# Patient Record
Sex: Female | Born: 1935 | Race: Black or African American | Hispanic: No | Smoking: Former smoker
Health system: Southern US, Community
[De-identification: ages and names within clinical notes are randomized; demographics above are authoritative.]

## PROBLEM LIST (undated history)

## (undated) DIAGNOSIS — E785 Hyperlipidemia, unspecified: Secondary | ICD-10-CM

## (undated) DIAGNOSIS — T7840XA Allergy, unspecified, initial encounter: Secondary | ICD-10-CM

## (undated) DIAGNOSIS — M1990 Unspecified osteoarthritis, unspecified site: Secondary | ICD-10-CM

## (undated) DIAGNOSIS — J449 Chronic obstructive pulmonary disease, unspecified: Secondary | ICD-10-CM

## (undated) DIAGNOSIS — N186 End stage renal disease: Secondary | ICD-10-CM

## (undated) DIAGNOSIS — R739 Hyperglycemia, unspecified: Secondary | ICD-10-CM

## (undated) DIAGNOSIS — J45909 Unspecified asthma, uncomplicated: Secondary | ICD-10-CM

## (undated) DIAGNOSIS — N189 Chronic kidney disease, unspecified: Secondary | ICD-10-CM

## (undated) DIAGNOSIS — E871 Hypo-osmolality and hyponatremia: Secondary | ICD-10-CM

## (undated) DIAGNOSIS — I10 Essential (primary) hypertension: Secondary | ICD-10-CM

## (undated) DIAGNOSIS — I509 Heart failure, unspecified: Secondary | ICD-10-CM

## (undated) DIAGNOSIS — N179 Acute kidney failure, unspecified: Secondary | ICD-10-CM

## (undated) DIAGNOSIS — K219 Gastro-esophageal reflux disease without esophagitis: Secondary | ICD-10-CM

## (undated) DIAGNOSIS — J9610 Chronic respiratory failure, unspecified whether with hypoxia or hypercapnia: Secondary | ICD-10-CM

## (undated) HISTORY — DX: Unspecified asthma, uncomplicated: J45.909

## (undated) HISTORY — DX: Unspecified osteoarthritis, unspecified site: M19.90

## (undated) HISTORY — PX: TUMOR REMOVAL: SHX12

## (undated) HISTORY — DX: Allergy, unspecified, initial encounter: T78.40XA

## (undated) HISTORY — DX: Gastro-esophageal reflux disease without esophagitis: K21.9

## (undated) HISTORY — DX: Hyperlipidemia, unspecified: E78.5

## (undated) HISTORY — DX: Heart failure, unspecified: I50.9

## (undated) HISTORY — PX: CATARACT EXTRACTION: SUR2

## (undated) HISTORY — PX: VAGINAL HYSTERECTOMY: SUR661

## (undated) HISTORY — DX: Essential (primary) hypertension: I10

---

## 1960-07-02 HISTORY — PX: ECTOPIC PREGNANCY SURGERY: SHX613

## 1978-07-02 DIAGNOSIS — I509 Heart failure, unspecified: Secondary | ICD-10-CM

## 1978-07-02 HISTORY — DX: Heart failure, unspecified: I50.9

## 1997-10-26 ENCOUNTER — Other Ambulatory Visit: Admission: RE | Admit: 1997-10-26 | Discharge: 1997-10-26 | Payer: Self-pay | Admitting: Internal Medicine

## 1997-11-16 ENCOUNTER — Other Ambulatory Visit: Admission: RE | Admit: 1997-11-16 | Discharge: 1997-11-16 | Payer: Self-pay | Admitting: Internal Medicine

## 1998-01-11 ENCOUNTER — Other Ambulatory Visit: Admission: RE | Admit: 1998-01-11 | Discharge: 1998-01-11 | Payer: Self-pay | Admitting: Internal Medicine

## 1998-01-11 ENCOUNTER — Ambulatory Visit (HOSPITAL_COMMUNITY): Admission: RE | Admit: 1998-01-11 | Discharge: 1998-01-11 | Payer: Self-pay | Admitting: Internal Medicine

## 1998-02-14 ENCOUNTER — Other Ambulatory Visit: Admission: RE | Admit: 1998-02-14 | Discharge: 1998-02-14 | Payer: Self-pay | Admitting: Obstetrics & Gynecology

## 1999-01-17 ENCOUNTER — Encounter: Payer: Self-pay | Admitting: Internal Medicine

## 1999-01-17 ENCOUNTER — Ambulatory Visit (HOSPITAL_COMMUNITY): Admission: RE | Admit: 1999-01-17 | Discharge: 1999-01-17 | Payer: Self-pay | Admitting: Internal Medicine

## 1999-03-20 ENCOUNTER — Other Ambulatory Visit: Admission: RE | Admit: 1999-03-20 | Discharge: 1999-03-20 | Payer: Self-pay | Admitting: Obstetrics & Gynecology

## 1999-10-12 ENCOUNTER — Encounter: Payer: Self-pay | Admitting: Internal Medicine

## 1999-10-12 ENCOUNTER — Ambulatory Visit (HOSPITAL_COMMUNITY): Admission: RE | Admit: 1999-10-12 | Discharge: 1999-10-12 | Payer: Self-pay | Admitting: Internal Medicine

## 2000-01-19 ENCOUNTER — Encounter: Payer: Self-pay | Admitting: Obstetrics and Gynecology

## 2000-01-19 ENCOUNTER — Ambulatory Visit (HOSPITAL_COMMUNITY): Admission: RE | Admit: 2000-01-19 | Discharge: 2000-01-19 | Payer: Self-pay | Admitting: Obstetrics and Gynecology

## 2000-02-27 ENCOUNTER — Ambulatory Visit (HOSPITAL_COMMUNITY): Admission: RE | Admit: 2000-02-27 | Discharge: 2000-02-27 | Payer: Self-pay | Admitting: Internal Medicine

## 2000-02-27 ENCOUNTER — Encounter: Payer: Self-pay | Admitting: Internal Medicine

## 2000-04-16 ENCOUNTER — Other Ambulatory Visit: Admission: RE | Admit: 2000-04-16 | Discharge: 2000-04-16 | Payer: Self-pay | Admitting: Obstetrics and Gynecology

## 2001-01-28 ENCOUNTER — Ambulatory Visit (HOSPITAL_COMMUNITY): Admission: RE | Admit: 2001-01-28 | Discharge: 2001-01-28 | Payer: Self-pay | Admitting: Internal Medicine

## 2001-01-28 ENCOUNTER — Encounter: Payer: Self-pay | Admitting: Obstetrics and Gynecology

## 2001-04-21 ENCOUNTER — Other Ambulatory Visit: Admission: RE | Admit: 2001-04-21 | Discharge: 2001-04-21 | Payer: Self-pay | Admitting: Obstetrics and Gynecology

## 2001-04-21 ENCOUNTER — Ambulatory Visit (HOSPITAL_COMMUNITY): Admission: RE | Admit: 2001-04-21 | Discharge: 2001-04-21 | Payer: Self-pay | Admitting: Obstetrics and Gynecology

## 2001-04-21 ENCOUNTER — Encounter: Payer: Self-pay | Admitting: Obstetrics and Gynecology

## 2001-06-02 ENCOUNTER — Ambulatory Visit (HOSPITAL_COMMUNITY): Admission: RE | Admit: 2001-06-02 | Discharge: 2001-06-03 | Payer: Self-pay | Admitting: Cardiovascular Disease

## 2001-06-03 ENCOUNTER — Encounter: Payer: Self-pay | Admitting: Cardiovascular Disease

## 2001-10-21 ENCOUNTER — Encounter: Payer: Self-pay | Admitting: Internal Medicine

## 2001-10-21 ENCOUNTER — Ambulatory Visit (HOSPITAL_COMMUNITY): Admission: RE | Admit: 2001-10-21 | Discharge: 2001-10-21 | Payer: Self-pay | Admitting: Internal Medicine

## 2002-02-02 ENCOUNTER — Encounter: Payer: Self-pay | Admitting: Obstetrics and Gynecology

## 2002-02-02 ENCOUNTER — Ambulatory Visit (HOSPITAL_COMMUNITY): Admission: RE | Admit: 2002-02-02 | Discharge: 2002-02-02 | Payer: Self-pay | Admitting: Obstetrics and Gynecology

## 2002-03-18 ENCOUNTER — Emergency Department (HOSPITAL_COMMUNITY): Admission: EM | Admit: 2002-03-18 | Discharge: 2002-03-19 | Payer: Self-pay | Admitting: Emergency Medicine

## 2002-03-18 ENCOUNTER — Encounter: Payer: Self-pay | Admitting: Emergency Medicine

## 2002-04-30 ENCOUNTER — Other Ambulatory Visit: Admission: RE | Admit: 2002-04-30 | Discharge: 2002-04-30 | Payer: Self-pay | Admitting: Obstetrics and Gynecology

## 2003-02-04 ENCOUNTER — Ambulatory Visit (HOSPITAL_COMMUNITY): Admission: RE | Admit: 2003-02-04 | Discharge: 2003-02-04 | Payer: Self-pay | Admitting: Obstetrics and Gynecology

## 2003-02-04 ENCOUNTER — Encounter: Payer: Self-pay | Admitting: Obstetrics and Gynecology

## 2003-06-11 ENCOUNTER — Other Ambulatory Visit: Admission: RE | Admit: 2003-06-11 | Discharge: 2003-06-11 | Payer: Self-pay | Admitting: Obstetrics and Gynecology

## 2004-02-17 ENCOUNTER — Ambulatory Visit (HOSPITAL_COMMUNITY): Admission: RE | Admit: 2004-02-17 | Discharge: 2004-02-17 | Payer: Self-pay | Admitting: Obstetrics and Gynecology

## 2005-02-16 ENCOUNTER — Ambulatory Visit (HOSPITAL_COMMUNITY): Admission: RE | Admit: 2005-02-16 | Discharge: 2005-02-16 | Payer: Self-pay | Admitting: Obstetrics and Gynecology

## 2005-06-21 ENCOUNTER — Other Ambulatory Visit: Admission: RE | Admit: 2005-06-21 | Discharge: 2005-06-21 | Payer: Self-pay | Admitting: Obstetrics and Gynecology

## 2006-02-19 ENCOUNTER — Ambulatory Visit (HOSPITAL_COMMUNITY): Admission: RE | Admit: 2006-02-19 | Discharge: 2006-02-19 | Payer: Self-pay | Admitting: Obstetrics and Gynecology

## 2007-02-25 ENCOUNTER — Ambulatory Visit (HOSPITAL_COMMUNITY): Admission: RE | Admit: 2007-02-25 | Discharge: 2007-02-25 | Payer: Self-pay | Admitting: Obstetrics and Gynecology

## 2008-02-26 ENCOUNTER — Ambulatory Visit (HOSPITAL_COMMUNITY): Admission: RE | Admit: 2008-02-26 | Discharge: 2008-02-26 | Payer: Self-pay | Admitting: Obstetrics and Gynecology

## 2008-12-24 ENCOUNTER — Encounter: Admission: RE | Admit: 2008-12-24 | Discharge: 2008-12-24 | Payer: Self-pay | Admitting: Obstetrics and Gynecology

## 2009-02-28 ENCOUNTER — Ambulatory Visit (HOSPITAL_COMMUNITY): Admission: RE | Admit: 2009-02-28 | Discharge: 2009-02-28 | Payer: Self-pay | Admitting: Obstetrics and Gynecology

## 2009-07-14 ENCOUNTER — Ambulatory Visit (HOSPITAL_COMMUNITY): Admission: RE | Admit: 2009-07-14 | Discharge: 2009-07-14 | Payer: Self-pay | Admitting: Internal Medicine

## 2010-03-14 ENCOUNTER — Ambulatory Visit (HOSPITAL_COMMUNITY): Admission: RE | Admit: 2010-03-14 | Discharge: 2010-03-14 | Payer: Self-pay | Admitting: Obstetrics and Gynecology

## 2010-07-24 ENCOUNTER — Encounter: Payer: Self-pay | Admitting: Obstetrics and Gynecology

## 2011-02-12 ENCOUNTER — Other Ambulatory Visit (HOSPITAL_COMMUNITY): Payer: Self-pay | Admitting: Obstetrics and Gynecology

## 2011-02-12 DIAGNOSIS — Z1231 Encounter for screening mammogram for malignant neoplasm of breast: Secondary | ICD-10-CM

## 2011-03-20 ENCOUNTER — Ambulatory Visit (HOSPITAL_COMMUNITY): Payer: Medicare Other

## 2011-04-06 ENCOUNTER — Ambulatory Visit (HOSPITAL_COMMUNITY)
Admission: RE | Admit: 2011-04-06 | Discharge: 2011-04-06 | Disposition: A | Discharge status: Home / Self Care | Payer: Medicare Other | Source: Ambulatory Visit | Attending: Obstetrics and Gynecology | Admitting: Obstetrics and Gynecology

## 2011-04-06 DIAGNOSIS — Z1231 Encounter for screening mammogram for malignant neoplasm of breast: Secondary | ICD-10-CM | POA: Insufficient documentation

## 2011-09-12 ENCOUNTER — Encounter: Payer: Self-pay | Admitting: Internal Medicine

## 2011-10-25 ENCOUNTER — Encounter: Payer: Self-pay | Admitting: Internal Medicine

## 2011-12-07 ENCOUNTER — Ambulatory Visit (AMBULATORY_SURGERY_CENTER): Payer: Medicare Other | Admitting: *Deleted

## 2011-12-07 VITALS — Ht 64.0 in | Wt 197.0 lb

## 2011-12-07 DIAGNOSIS — Z1211 Encounter for screening for malignant neoplasm of colon: Secondary | ICD-10-CM

## 2011-12-07 MED ORDER — PEG-KCL-NACL-NASULF-NA ASC-C 100 G PO SOLR: ORAL | Status: DC

## 2011-12-21 ENCOUNTER — Encounter: Payer: Self-pay | Admitting: Internal Medicine

## 2011-12-21 ENCOUNTER — Ambulatory Visit (AMBULATORY_SURGERY_CENTER): Payer: Medicare Other | Admitting: Internal Medicine

## 2011-12-21 VITALS — BP 186/90 | HR 64 | Temp 98.5°F | Resp 20 | Ht 64.0 in | Wt 197.0 lb

## 2011-12-21 DIAGNOSIS — Z1211 Encounter for screening for malignant neoplasm of colon: Secondary | ICD-10-CM

## 2011-12-21 MED ORDER — SODIUM CHLORIDE 0.9 % IV SOLN: 500.0000 mL | INTRAVENOUS | Status: DC

## 2011-12-21 NOTE — Progress Notes (Signed)
[id number]  Patient desatted to 76%, and had to be bagged for approx. 30 secs; was given an airway by CRNA.   10l of 02 given via bag and then via airway; pt then responsive and sats were stable.   Pt had her airway taken out by Levin Erp, CRNA.  Patient awake and alert and taken to recovery room in stablew condition with sats of 96%. Ernestine Conrad, RN

## 2011-12-21 NOTE — Patient Instructions (Addendum)
YOU HAD AN ENDOSCOPIC PROCEDURE TODAY AT THE [facility]: Refer to the procedure report that was given to you for any specific questions about what was found during the examination.  If the procedure report does not answer your questions, please call your gastroenterologist to clarify.  If you requested that your care partner not be given the details of your procedure findings, then the procedure report has been included in a sealed envelope for you to review at your convenience later.  YOU SHOULD EXPECT: Some feelings of bloating in the abdomen. Passage of more gas than usual.  Walking can help get rid of the air that was put into your GI tract during the procedure and reduce the bloating. If you had a lower endoscopy (such as a colonoscopy or flexible sigmoidoscopy) you may notice spotting of blood in your stool or on the toilet paper. If you underwent a bowel prep for your procedure, then you may not have a normal bowel movement for a few days.  DIET: Your first meal following the procedure should be a light meal and then it is ok to progress to your normal diet.  A half-sandwich or bowl of soup is an example of a good first meal.  Heavy or fried foods are harder to digest and may make you feel nauseous or bloated.  Likewise meals heavy in dairy and vegetables can cause extra gas to form and this can also increase the bloating.  Drink plenty of fluids but you should avoid alcoholic beverages for 24 hours.  ACTIVITY: Your care partner should take you home directly after the procedure.  You should plan to take it easy, moving slowly for the rest of the day.  You can resume normal activity the day after the procedure however you should NOT DRIVE or use heavy machinery for 24 hours (because of the sedation medicines used during the test).    SYMPTOMS TO REPORT IMMEDIATELY: A gastroenterologist can be reached at any hour.  During normal business hours, 8:30 AM to 5:00 PM Monday through Friday,  call [phone].  After hours and on weekends, please call the GI answering service at [phone] who will take a message and have the physician on call contact you.   Following lower endoscopy (colonoscopy or flexible sigmoidoscopy):  Excessive amounts of blood in the stool  Significant tenderness or worsening of abdominal pains  Swelling of the abdomen that is new, acute  Fever of 100F or higher  Following upper endoscopy (EGD)  Vomiting of blood or coffee ground material  New chest pain or pain under the shoulder blades  Painful or persistently difficult swallowing  New shortness of breath  Fever of 100F or higher  Black, tarry-looking stools  FOLLOW UP: If any biopsies were taken you will be contacted by phone or by letter within the next 1-3 weeks.  Call your gastroenterologist if you have not heard about the biopsies in 3 weeks.  Our staff will call the home number listed on your records the next business day following your procedure to check on you and address any questions or concerns that you may have at that time regarding the information given to you following your procedure. This is a courtesy call and so if there is no answer at the home number and we have not heard from you through the emergency physician on call, we will assume that you have returned to your regular daily activities without incident.  SIGNATURES/CONFIDENTIALITY: You and/or your care   partner have signed paperwork which will be entered into your electronic medical record.  These signatures attest to the fact that that the information above on your After Visit Summary has been reviewed and is understood.  Full responsibility of the confidentiality of this discharge information lies with you and/or your care-partner.  

## 2011-12-21 NOTE — Progress Notes (Signed)
Patient did not experience any of the following events: a burn prior to discharge; a fall within the facility; wrong site/side/patient/procedure/implant event; or a hospital transfer or hospital admission upon discharge from the facility. (G8907) Patient did not have preoperative order for IV antibiotic SSI prophylaxis. (G8918)  

## 2011-12-21 NOTE — Op Note (Signed)
[facility] [employer]. [place],   [id number]  COLONOSCOPY PROCEDURE REPORT  PATIENT:  Mallory Chavez, Mallory Chavez  MR#:  [id number] BIRTHDATE:  11-Jul-1935, 75 yrs. old  GENDER:  female ENDOSCOPIST:  Lowella Bandy. Olevia Perches, MD REF. BY:  Nolene Ebbs, M.D. PROCEDURE DATE:  12/21/2011 PROCEDURE:  Colonoscopy [id number] ASA CLASS:  Class III INDICATIONS:  colorectal cancer screening, average risk last colon 2003 - diverticulosis MEDICATIONS:   MAC sedation, administered by CRNA, propofol (Diprivan) 120 mg  DESCRIPTION OF PROCEDURE:   After the risks and benefits and of the procedure were explained, informed consent was obtained. Digital rectal exam was performed and revealed no rectal masses. The LB CF-H180AL [id number] endoscope was introduced through the anus and advanced to the cecum, which was identified by both the appendix and ileocecal valve.  The quality of the prep was good, using MoviPrep.  The instrument was then slowly withdrawn as the colon was fully examined. <<PROCEDUREIMAGES>>  FINDINGS:  Severe diverticulosis was found throughout the colon (see image1, image2, image5, and image6).  This was otherwise a normal examination of the colon (see image7, image3, image4, and image5).   Retroflexed views in the rectum revealed no abnormalities.    The scope was then withdrawn from the patient and the procedure completed.  COMPLICATIONS:  A complication of hypoxemia occured on 12/21/2011 at.  o2 down to 76%, responded to oral airway and ventilation via Ambu bag x 30 sec, responded promptly ENDOSCOPIC IMPRESSION: 1) Severe diverticulosis throughout the colon 2) Otherwise normal examination RECOMMENDATIONS: 1) High fiber diet Metamucil 1 tablesp daily  REPEAT EXAM:  In 0 year(s) for.  no recall due to age and cardiac problems  ______________________________ Lowella Bandy. Olevia Perches, MD  CC:  n. eSIGNED:   Lowella Bandy. Daiton Cowles at 12/21/2011 10:07 AM  Curt Jews, [id number]

## 2011-12-24 ENCOUNTER — Telehealth: Payer: Self-pay | Admitting: *Deleted

## 2011-12-24 NOTE — Telephone Encounter (Signed)
Called patient's number x 3, busy.

## 2012-03-17 ENCOUNTER — Other Ambulatory Visit (HOSPITAL_COMMUNITY): Payer: Self-pay | Admitting: Internal Medicine

## 2012-03-17 DIAGNOSIS — Z1231 Encounter for screening mammogram for malignant neoplasm of breast: Secondary | ICD-10-CM

## 2012-04-08 ENCOUNTER — Ambulatory Visit (HOSPITAL_COMMUNITY)
Admission: RE | Admit: 2012-04-08 | Discharge: 2012-04-08 | Disposition: A | Discharge status: Home / Self Care | Payer: Medicare Other | Source: Ambulatory Visit | Attending: Internal Medicine | Admitting: Internal Medicine

## 2012-04-08 DIAGNOSIS — Z1231 Encounter for screening mammogram for malignant neoplasm of breast: Secondary | ICD-10-CM

## 2012-09-30 ENCOUNTER — Institutional Professional Consult (permissible substitution): Payer: Medicare Other | Admitting: Internal Medicine

## 2012-10-01 ENCOUNTER — Ambulatory Visit (INDEPENDENT_AMBULATORY_CARE_PROVIDER_SITE_OTHER): Payer: Medicare Other | Admitting: Internal Medicine

## 2012-10-01 ENCOUNTER — Encounter: Payer: Self-pay | Admitting: Internal Medicine

## 2012-10-01 VITALS — BP 154/82 | HR 68 | Temp 97.8°F | Ht 64.0 in | Wt 207.0 lb

## 2012-10-01 DIAGNOSIS — I10 Essential (primary) hypertension: Secondary | ICD-10-CM

## 2012-10-01 DIAGNOSIS — R0602 Shortness of breath: Secondary | ICD-10-CM

## 2012-10-01 MED ORDER — OLMESARTAN MEDOXOMIL-HCTZ 40-25 MG PO TABS: ORAL_TABLET | ORAL | Status: DC

## 2012-10-01 NOTE — Patient Instructions (Addendum)
benicar 40/25 one half daily in place of lisnopril  GERD (REFLUX)  is an extremely common cause of respiratory symptoms, many times with no significant heartburn at all.    It can be treated with medication, but also with lifestyle changes including avoidance of late meals, excessive alcohol, smoking cessation, and avoid fatty foods, chocolate, peppermint, colas, red wine, and acidic juices such as orange juice.  NO MINT OR MENTHOL PRODUCTS SO NO COUGH DROPS  USE SUGARLESS CANDY INSTEAD (jolley ranchers or Stover's)  NO OIL BASED VITAMINS - use powdered substitutes.  Please schedule a follow up office visit in 4 weeks, sooner if needed for PFT's and just use your inhalers as needed ( you should find you need them less and less)

## 2012-10-01 NOTE — Progress Notes (Signed)
  Subjective:    Patient ID: Mallory Chavez, female    DOB: 1935/10/27 MRN: [id number]  HPI   16 yowbf quit smoking around 1994 bothered by some seasonal rhinitis spring > fall x around 1990 then 2012 developed breathing problems dx asthma variably responsive to rx referred 10/01/2012 to pulmonary clinic by Dr Jeanie Cooks.   10/01/2012 1st pulmonary /Bowen Goyal  On ACEi cc abruptly 2012 shortness of breath assoc with cough min productive mostly clear. Sob x across a parking lot. Not responding to multiple asthma meds  No obvious daytime variabilty to symptoms or assoc changes in cough or sputum vol or color cp or chest tightness, subjective wheeze overt sinus or hb symptoms. No unusual exp hx or h/o childhood pna/ asthma or premature birth to her  knowledge.   Sleeping ok without nocturnal  or early am exacerbation  of respiratory  c/o's or need for noct saba. Also denies any obvious fluctuation of symptoms with weather or environmental changes or other aggravating or alleviating factors except as outlined above         Review of Systems  Constitutional: Positive for unexpected weight change. Negative for fever.  HENT: Positive for ear pain, congestion and sneezing. Negative for nosebleeds, sore throat, rhinorrhea, trouble swallowing, dental problem, postnasal drip and sinus pressure.   Eyes: Negative for redness and itching.  Respiratory: Positive for cough and shortness of breath. Negative for chest tightness and wheezing.   Cardiovascular: Positive for palpitations. Negative for leg swelling.  Gastrointestinal: Negative for nausea and vomiting.  Genitourinary: Negative for dysuria.  Musculoskeletal: Negative for joint swelling.  Skin: Negative for rash.  Neurological: Negative for headaches.  Hematological: Does not bruise/bleed easily.  Psychiatric/Behavioral: Negative for dysphoric mood. The patient is not nervous/anxious.        Objective:   Physical Exam   Wt Readings from Last 3  Encounters:  10/01/12 207 lb (93.895 kg)  12/21/11 197 lb (89.359 kg)  12/07/11 197 lb (89.359 kg)     HEENT: nl dentition, turbinates, and orophanx. Nl external ear canals without cough reflex   NECK :  without JVD/Nodes/TM/ nl carotid upstrokes bilaterally   LUNGS: no acc muscle use, clear to A and P bilaterally without cough on insp or exp maneuvers   CV:  RRR  no s3 or murmur or increase in P2, no edema   ABD:  soft and nontender with nl excursion in the supine position. No bruits or organomegaly, bowel sounds nl  MS:  warm without deformities, calf tenderness, cyanosis or clubbing  SKIN: warm and dry without lesions    NEURO:  alert, approp, no deficits    cxr > none recent on file      Assessment & Plan:

## 2012-10-03 DIAGNOSIS — I10 Essential (primary) hypertension: Secondary | ICD-10-CM | POA: Insufficient documentation

## 2012-10-03 NOTE — Assessment & Plan Note (Signed)

## 2012-10-03 NOTE — Assessment & Plan Note (Addendum)
Symptoms are markedly disproportionate to objective findings and not clear this is a lung problem but pt does appear to have difficult airway management issues. . DDX of  difficult airways managment all start with A and  include Adherence, Ace Inhibitors, Acid Reflux, Active Sinus Disease, Alpha 1 Antitripsin deficiency, Anxiety masquerading as Airways dz,  ABPA,  allergy(esp in young), Aspiration (esp in elderly), Adverse effects of DPI,  Active smokers, plus two Bs  = Bronchiectasis and Beta blocker use..and one C= CHF   ACEi would be the leading suspect here> try off first then regroup  Adherence is always the initial "prime suspect" and is a multilayered concern that requires a "trust but verify" approach in every patient - starting with knowing how to use medications, especially inhalers, correctly, keeping up with refills and understanding the fundamental difference between maintenance and prns vs those medications only taken for a very short course and then stopped and not refilled.   ? Acid reflux > diet reviewed  ? Beta blocker effect > would be a concern if she really had asthma but for now ok on coreg

## 2012-11-10 ENCOUNTER — Encounter: Payer: Self-pay | Admitting: Internal Medicine

## 2012-11-10 ENCOUNTER — Ambulatory Visit (INDEPENDENT_AMBULATORY_CARE_PROVIDER_SITE_OTHER): Payer: Medicare Other | Admitting: Internal Medicine

## 2012-11-10 VITALS — BP 148/80 | HR 72 | Temp 98.5°F | Ht 63.0 in | Wt 206.0 lb

## 2012-11-10 DIAGNOSIS — J449 Chronic obstructive pulmonary disease, unspecified: Secondary | ICD-10-CM

## 2012-11-10 DIAGNOSIS — I10 Essential (primary) hypertension: Secondary | ICD-10-CM

## 2012-11-10 DIAGNOSIS — R0602 Shortness of breath: Secondary | ICD-10-CM

## 2012-11-10 DIAGNOSIS — J4489 Other specified chronic obstructive pulmonary disease: Secondary | ICD-10-CM

## 2012-11-10 MED ORDER — ACLIDINIUM BROMIDE 400 MCG/ACT IN AEPB: 1.0000 | INHALATION_SPRAY | Freq: Two times a day (BID) | RESPIRATORY_TRACT | Status: DC

## 2012-11-10 NOTE — Progress Notes (Signed)
  Subjective:    Patient ID: Mallory Chavez, female    DOB: Aug 31, 1935 MRN: [id number]  HPI   55 yowbf quit smoking around 1994 bothered by some seasonal rhinitis spring > fall x around 1990 then 2012 developed breathing problems dx asthma variably responsive to rx referred 10/01/2012 to pulmonary clinic by Dr Jeanie Cooks.   10/01/2012 1st pulmonary /Zuria Fosdick  On ACEi cc abruptly 2012 shortness of breath assoc with cough min productive mostly clear. Sob x across a parking lot. Not responding to multiple asthma meds. rec benicar 40/25 one half daily in place of lisnopril GERD diet   11/10/2012 f/u ov/Kristian Mogg re cough/ copd Chief Complaint  Patient presents with  . Follow-up    Pt states breathing has improved some since the last visit. She c/o having some swelling in her fett for the past 4 wks.   no need for albuterol at all, some throat clearing needed in ams, still doe x 5-10 min bike   No obvious daytime variabilty to symptoms or assoc change sputum vol or color cp or chest tightness, subjective wheeze overt sinus or hb symptoms. No unusual exp hx or h/o childhood pna/ asthma or premature birth to her  knowledge.   Sleeping ok without nocturnal  or early am exacerbation  of respiratory  c/o's or need for noct saba. Also denies any obvious fluctuation of symptoms with weather or environmental changes or other aggravating or alleviating factors except as outlined above   Current Medications, Allergies, Past Medical History, Past Surgical History, Family History, and Social History were reviewed in [employer] record.  ROS  The following are not active complaints unless bolded sore throat, dysphagia, dental problems, itching, sneezing,  nasal congestion or excess/ purulent secretions, ear ache,   fever, chills, sweats, unintended wt loss, pleuritic or exertional cp, hemoptysis,  orthopnea pnd or leg swelling, presyncope, palpitations, heartburn, abdominal pain, anorexia,  nausea, vomiting, diarrhea  or change in bowel or urinary habits, change in stools or urine, dysuria,hematuria,  rash, arthralgias, visual complaints, headache, numbness weakness or ataxia or problems with walking or coordination,  change in mood/affect or memory.                 Objective:   Physical Exam  11/10/2012       206 Wt Readings from Last 3 Encounters:  10/01/12 207 lb (93.895 kg)  12/21/11 197 lb (89.359 kg)  12/07/11 197 lb (89.359 kg)     HEENT: nl dentition, turbinates, and orophanx. Nl external ear canals without cough reflex   NECK :  without JVD/Nodes/TM/ nl carotid upstrokes bilaterally   LUNGS: no acc muscle use, clear to A and P bilaterally without cough on insp or exp maneuvers   CV:  RRR  no s3 or murmur or increase in P2, no edema   ABD:  soft and nontender with nl excursion in the supine position. No bruits or organomegaly, bowel sounds nl  MS:  warm without deformities, calf tenderness, cyanosis or clubbing               Assessment & Plan:

## 2012-11-10 NOTE — Assessment & Plan Note (Signed)
Change acei to arb effective 10/01/12 for pseudoasthma > resolved    No question she's better off acei > bp well controlled on arb> continue for now

## 2012-11-10 NOTE — Patient Instructions (Addendum)
Add turdorza one puff twice daily after dulera   Please schedule a follow up office visit in 6 weeks, call sooner if needed Late add cxr on return

## 2012-11-10 NOTE — Progress Notes (Signed)
PFT done today. 

## 2012-11-10 NOTE — Assessment & Plan Note (Addendum)
-   PFT's 11/10/2012 FEV1 0.85 (52%)ratio 56 and no better p B 2 - 5-10 min stationery bike > add tudorza  She is clearly better but given severity of persistent doe needs trial of lama  The proper method of use, as well as anticipated side effects, of a metered-dose inhaler are discussed and demonstrated to the patient. Improved effectiveness after extensive coaching during this visit to a level of approximately  90% so ok to add  tudorza with other option change to symbicort 160 2 bid now that cough gone and can likely tolerate max laba/ics better than while on acei    Each maintenance medication was reviewed in detail including most importantly the difference between maintenance and as needed and under what circumstances the prns are to be used.  Please see instructions for details which were reviewed in writing and the patient given a copy.

## 2012-11-19 ENCOUNTER — Encounter: Payer: Self-pay | Admitting: Internal Medicine

## 2012-12-19 ENCOUNTER — Encounter: Payer: Self-pay | Admitting: Internal Medicine

## 2012-12-19 ENCOUNTER — Ambulatory Visit (INDEPENDENT_AMBULATORY_CARE_PROVIDER_SITE_OTHER)
Admission: RE | Admit: 2012-12-19 | Discharge: 2012-12-19 | Disposition: A | Discharge status: Home / Self Care | Payer: Medicare Other | Source: Ambulatory Visit | Attending: Internal Medicine | Admitting: Internal Medicine

## 2012-12-19 ENCOUNTER — Other Ambulatory Visit (INDEPENDENT_AMBULATORY_CARE_PROVIDER_SITE_OTHER): Payer: Medicare Other

## 2012-12-19 ENCOUNTER — Ambulatory Visit (INDEPENDENT_AMBULATORY_CARE_PROVIDER_SITE_OTHER): Payer: Medicare Other | Admitting: Internal Medicine

## 2012-12-19 VITALS — BP 132/84 | HR 76 | Temp 97.4°F | Ht 62.5 in | Wt 206.0 lb

## 2012-12-19 DIAGNOSIS — I10 Essential (primary) hypertension: Secondary | ICD-10-CM

## 2012-12-19 DIAGNOSIS — J449 Chronic obstructive pulmonary disease, unspecified: Secondary | ICD-10-CM

## 2012-12-19 DIAGNOSIS — R0602 Shortness of breath: Secondary | ICD-10-CM

## 2012-12-19 DIAGNOSIS — N19 Unspecified kidney failure: Secondary | ICD-10-CM

## 2012-12-19 LAB — BASIC METABOLIC PANEL
BUN: 30 mg/dL — ABNORMAL HIGH (ref 6–23)
CO2: 25 mEq/L (ref 19–32)
Calcium: 10.6 mg/dL — ABNORMAL HIGH (ref 8.4–10.5)
Chloride: 102 mEq/L (ref 96–112)
Creatinine, Ser: 1.8 mg/dL — ABNORMAL HIGH (ref 0.4–1.2)

## 2012-12-19 LAB — CBC WITH DIFFERENTIAL/PLATELET
Basophils Absolute: 0 10*3/uL (ref 0.0–0.1)
HCT: 43.6 % (ref 36.0–46.0)
Hemoglobin: 14.4 g/dL (ref 12.0–15.0)
Lymphs Abs: 2.8 10*3/uL (ref 0.7–4.0)
MCHC: 33 g/dL (ref 30.0–36.0)
Monocytes Relative: 9.9 % (ref 3.0–12.0)
Neutro Abs: 3.8 10*3/uL (ref 1.4–7.7)
RDW: 13.8 % (ref 11.5–14.6)

## 2012-12-19 LAB — BRAIN NATRIURETIC PEPTIDE: Pro B Natriuretic peptide (BNP): 76 pg/mL (ref 0.0–100.0)

## 2012-12-19 NOTE — Progress Notes (Signed)
Subjective:    Patient ID: Mallory Chavez, female    DOB: 06-May-1936 MRN: [id number]    Brief patient profile:  31 yowbf quit smoking around 1994 bothered by some seasonal rhinitis spring > fall x around 1990 then 2012 developed breathing problems dx asthma variably responsive to rx referred 10/01/2012 to pulmonary clinic by Dr Jeanie Cooks and proved to have GOLD II COPD 10/2012   10/01/2012 1st pulmonary /Zion Ta  On ACEi cc abruptly 2012 shortness of breath assoc with cough min productive mostly clear. Sob x across a parking lot. Not responding to multiple asthma meds. rec benicar 40/25 one half daily in place of lisnopril GERD diet   11/10/2012 f/u ov/Mallory Chavez re cough/ copd Chief Complaint  Patient presents with  . Follow-up    Pt states breathing has improved some since the last visit. She c/o having some swelling in her fett for the past 4 wks.   no need for albuterol at all, some throat clearing needed in ams, still doe x 5-10 min bike rec Add turdorza one puff twice daily after dulera     12/19/2012 f/u ov/Mallory Chavez re copd Chief Complaint  Patient presents with  . Followup with cxr    Pt states her breathing continues to improve as well as her edema in feet. Her sats when arrived were 80%ra    no longer coughing. Walking 30 min daily  Using dulera 100 2bid and rarely the rescue, has neb doesn't use it.    No obvious daytime variabilty to symptoms or assoc change sputum vol or color cp or chest tightness, subjective wheeze overt sinus or hb symptoms. No unusual exp hx or h/o childhood pna/ asthma or premature birth to her  knowledge.   Sleeping ok without nocturnal  or early am exacerbation  of respiratory  c/o's or need for noct saba. Also denies any obvious fluctuation of symptoms with weather or environmental changes or other aggravating or alleviating factors except as outlined above   Current Medications, Allergies, Past Medical History, Past Surgical History, Family History, and  Social History were reviewed in [employer] record.  ROS  The following are not active complaints unless bolded sore throat, dysphagia, dental problems, itching, sneezing,  nasal congestion or excess/ purulent secretions, ear ache,   fever, chills, sweats, unintended wt loss, pleuritic or exertional cp, hemoptysis,  orthopnea pnd or leg swelling, presyncope, palpitations, heartburn, abdominal pain, anorexia, nausea, vomiting, diarrhea  or change in bowel or urinary habits, change in stools or urine, dysuria,hematuria,  rash, arthralgias, visual complaints, headache, numbness weakness or ataxia or problems with walking or coordination,  change in mood/affect or memory.                 Objective:   Physical Exam  11/10/2012       206 vs 12/19/2012 206  Wt Readings from Last 3 Encounters:  10/01/12 207 lb (93.895 kg)  12/21/11 197 lb (89.359 kg)  12/07/11 197 lb (89.359 kg)     HEENT: nl dentition, turbinates, and orophanx. Nl external ear canals without cough reflex   NECK :  without JVD/Nodes/TM/ nl carotid upstrokes bilaterally   LUNGS: no acc muscle use, clear to A and P bilaterally without cough on insp or exp maneuvers   CV:  RRR  no s3 or murmur or increase in P2, trace bilateral sym edema   ABD:  soft and nontender with nl excursion in the supine position. No bruits or organomegaly, bowel sounds nl  MS:  warm without deformities, calf tenderness, cyanosis or clubbing          CXR  12/19/2012 :  Cardiomegaly with vascular congestion and bibasilar atelectasis.      Assessment & Plan:

## 2012-12-19 NOTE — Patient Instructions (Addendum)
Stop cod liver oil  And fish oil > eat more fish instead especially baked or broiled wild salmon > if arthritis comes back take the cod liver oil with breakfast  Stop dulera  Start symbicort 160 2 puffs every 12 hours   Please remember to go to the lab department downstairs for your tests - we will call you with the results when they are available.  Please see patient coordinator before you leave today  to schedule echo   Please schedule a follow up office visit in 4  weeks, call sooner if needed

## 2012-12-19 NOTE — Assessment & Plan Note (Signed)
Cough resolved off acei, but still on coreg > ideally  prefer in this setting: Bystolic, the most beta -1  selective Beta blocker available in sample form, with bisoprolol the most selective generic choice  on the market but for now will continue coreg and eval lv function with echo

## 2012-12-19 NOTE — Assessment & Plan Note (Signed)
-   PFT's 11/10/2012 FEV1 0.85 (52%)ratio 56 and no better p B 2 with DLCO 34 corrects to 60 - doe x5-10 min stationery bike > add tudorza 11/10/12   Overall much better off ace and on rx for copd which we may simplify p max rx with symbicort 160 2bid and tudorza bid  Her 02 sats  And dlco remain low c/w copd/emyphysema suggesting LAMA plus 02 may be just as effective but she does not want to start 02 if not absolutely needed   See instructions for specific recommendations which were reviewed directly with the patient who was given a copy with highlighter outlining the key components.

## 2012-12-20 DIAGNOSIS — N19 Unspecified kidney failure: Secondary | ICD-10-CM | POA: Insufficient documentation

## 2012-12-20 NOTE — Assessment & Plan Note (Signed)
Lab Results  Component Value Date   CREATININE 1.8* 12/19/2012    No baseline data available so will forward to her primary md and defer rx

## 2012-12-23 NOTE — Progress Notes (Signed)
Quick Note:  Spoke with pt and notified of results per Dr. Wert. Pt verbalized understanding and denied any questions.  ______ 

## 2012-12-29 ENCOUNTER — Ambulatory Visit (HOSPITAL_COMMUNITY): Payer: Medicare Other | Attending: Cardiology | Admitting: Radiology

## 2012-12-29 ENCOUNTER — Encounter: Payer: Self-pay | Admitting: Internal Medicine

## 2012-12-29 DIAGNOSIS — R0609 Other forms of dyspnea: Secondary | ICD-10-CM | POA: Insufficient documentation

## 2012-12-29 DIAGNOSIS — R0602 Shortness of breath: Secondary | ICD-10-CM

## 2012-12-29 DIAGNOSIS — J4489 Other specified chronic obstructive pulmonary disease: Secondary | ICD-10-CM | POA: Insufficient documentation

## 2012-12-29 DIAGNOSIS — J449 Chronic obstructive pulmonary disease, unspecified: Secondary | ICD-10-CM | POA: Insufficient documentation

## 2012-12-29 DIAGNOSIS — I10 Essential (primary) hypertension: Secondary | ICD-10-CM | POA: Insufficient documentation

## 2012-12-29 DIAGNOSIS — R0989 Other specified symptoms and signs involving the circulatory and respiratory systems: Secondary | ICD-10-CM | POA: Insufficient documentation

## 2012-12-29 DIAGNOSIS — E669 Obesity, unspecified: Secondary | ICD-10-CM | POA: Insufficient documentation

## 2012-12-29 NOTE — Progress Notes (Signed)
Echocardiogram performed.  

## 2013-01-16 ENCOUNTER — Encounter: Payer: Self-pay | Admitting: Internal Medicine

## 2013-01-16 ENCOUNTER — Ambulatory Visit (INDEPENDENT_AMBULATORY_CARE_PROVIDER_SITE_OTHER): Payer: Medicare Other | Admitting: Internal Medicine

## 2013-01-16 VITALS — BP 140/80 | HR 62 | Temp 98.1°F | Ht 63.25 in | Wt 207.8 lb

## 2013-01-16 DIAGNOSIS — I10 Essential (primary) hypertension: Secondary | ICD-10-CM

## 2013-01-16 DIAGNOSIS — J449 Chronic obstructive pulmonary disease, unspecified: Secondary | ICD-10-CM

## 2013-01-16 MED ORDER — OMEPRAZOLE 20 MG PO CPDR: DELAYED_RELEASE_CAPSULE | ORAL | Status: DC

## 2013-01-16 MED ORDER — BUDESONIDE-FORMOTEROL FUMARATE 160-4.5 MCG/ACT IN AERO: INHALATION_SPRAY | RESPIRATORY_TRACT | Status: DC

## 2013-01-16 MED ORDER — AZITHROMYCIN 250 MG PO TABS: ORAL_TABLET | ORAL | Status: DC

## 2013-01-16 NOTE — Progress Notes (Signed)
Subjective:    Patient ID: Mallory Chavez, female    DOB: 30-May-1936 MRN: [id number]    Brief patient profile:  89 yowbf quit smoking around 1994 bothered by some seasonal rhinitis spring > fall x around 1990 then 2012 developed breathing problems dx asthma variably responsive to rx referred 10/01/2012 to pulmonary clinic by Dr Jeanie Cooks and proved to have GOLD II COPD 10/2012  HPI 10/01/2012 1st pulmonary /Kanisha Duba  On ACEi cc abruptly 2012 shortness of breath assoc with cough min productive mostly clear. Sob x across a parking lot. Not responding to multiple asthma meds. rec benicar 40/25 one half daily in place of lisnopril GERD diet   11/10/2012 f/u ov/Shamal Stracener re cough/ copd Chief Complaint  Patient presents with  . Follow-up    Pt states breathing has improved some since the last visit. She c/o having some swelling in her fett for the past 4 wks.   no need for albuterol at all, some throat clearing needed in ams, still doe x 5-10 min bike rec Add turdorza one puff twice daily after dulera     12/19/2012 f/u ov/Annaleah Arata re copd Chief Complaint  Patient presents with  . Followup with cxr    Pt states her breathing continues to improve as well as her edema in feet. Her sats when arrived were 80%ra    no longer coughing. Walking 30 min daily  Using dulera 100 2bid and rarely the rescue, has neb doesn't use it.  rec Stop cod liver oil  And fish oil > eat more fish instead especially baked or broiled wild salmon > if arthritis comes back take the cod liver oil with breakfast Stop dulera Start symbicort 160 2 puffs every 12 hours      01/16/2013 f/u ov/Darlis Wragg re GOLD II copd with ab component  Chief Complaint  Patient presents with  . Follow-up    Pt states breathing continues to improve. She c/o hoarseness and throat clearing for the past 3 days- sometimes able to produce minimal brown sputum.    not as limited from desired activities, min use of saba    No obvious daytime variabilty to  symptoms or  cp or chest tightness, subjective wheeze overt sinus or hb symptoms. No unusual exp hx or h/o childhood pna/ asthma or premature birth to her  knowledge.   Sleeping ok without nocturnal  or early am exacerbation  of respiratory  c/o's or need for noct saba. Also denies any obvious fluctuation of symptoms with weather or environmental changes or other aggravating or alleviating factors except as outlined above   Current Medications, Allergies, Past Medical History, Past Surgical History, Family History, and Social History were reviewed in [employer] record.  ROS  The following are not active complaints unless bolded sore throat, dysphagia, dental problems, itching, sneezing,  nasal congestion or excess/ purulent secretions, ear ache,   fever, chills, sweats, unintended wt loss, pleuritic or exertional cp, hemoptysis,  orthopnea pnd or leg swelling, presyncope, palpitations, heartburn, abdominal pain, anorexia, nausea, vomiting, diarrhea  or change in bowel or urinary habits, change in stools or urine, dysuria,hematuria,  rash, arthralgias, visual complaints, headache, numbness weakness or ataxia or problems with walking or coordination,  change in mood/affect or memory.                 Objective:   Physical Exam  11/10/2012       206 vs 12/19/2012 206  Vs 207 01/16/2013  Wt Readings from Last  3 Encounters:  10/01/12 207 lb (93.895 kg)  12/21/11 197 lb (89.359 kg)  12/07/11 197 lb (89.359 kg)     HEENT: nl dentition, turbinates, and orophanx. Nl external ear canals without cough reflex   NECK :  without JVD/Nodes/TM/ nl carotid upstrokes bilaterally   LUNGS: no acc muscle use, clear to A and P bilaterally without cough on insp or exp maneuvers   CV:  RRR  no s3 or murmur or increase in P2, trace bilateral sym edema   ABD:  soft and nontender with nl excursion in the supine position. No bruits or organomegaly, bowel sounds nl  MS:  warm without  deformities, calf tenderness, cyanosis or clubbing          CXR  12/19/2012 :  Cardiomegaly with vascular congestion and bibasilar atelectasis. 12/29/12 Echo : c/w Grade 1 diastolic dysfunction with nl ef      Assessment & Plan:

## 2013-01-16 NOTE — Patient Instructions (Addendum)
Add pepcid 20 mg at bedtime as long as you are coughing in the am  For cough take mucinex dm 1200 every 12 hours (robitussin dm)  zpak called in   Work on perfecting inhaler technique:  relax and gently blow all the way out then take a nice smooth deep breath back in, triggering the inhaler at same time you start breathing in.  Hold for up to 5 seconds if you can.  Rinse and gargle with water when done   If your mouth or throat starts to bother you,   I suggest you time the inhaler to your dental care and after using the inhaler(s) brush teeth and tongue with a baking soda containing toothpaste and when you rinse this out, gargle with it first to see if this helps your mouth and throat.     GERD (REFLUX)  is an extremely common cause of respiratory symptoms, many times with no significant heartburn at all.    It can be treated with medication, but also with lifestyle changes including avoidance of late meals, excessive alcohol, smoking cessation, and avoid fatty foods, chocolate, peppermint, colas, red wine, and acidic juices such as orange juice.  NO MINT OR MENTHOL PRODUCTS SO NO COUGH DROPS  USE SUGARLESS CANDY INSTEAD (jolley ranchers or Stover's)  NO OIL BASED VITAMINS - use powdered substitutes.    Please schedule a follow up visit in 3 months but call sooner if needed  Late add: low treshold to d/c coreg > bisoprolol or bystolic

## 2013-01-18 NOTE — Assessment & Plan Note (Signed)
-   PFT's 11/10/2012 FEV1 0.85 (52%)ratio 56 and no better p B 2 with DLCO 34 corrects to 60 - doe x5-10 min stationery bike > add tudorza 11/10/12  - HFA 90% 01/16/13  Mild flare with discolored sputum typical of aecopd > rx with zpak and take pepcid at hs as long as having am cough to see what difference if any this makes

## 2013-01-18 NOTE — Assessment & Plan Note (Addendum)
Change acei to arb effective 10/01/12 for pseudoasthma > resolved   - Echo 12/29/2012  > Grade I diastolic dysfunction  Much better overall off acei albeit with max copd rx and may need to consider possible Beta blocker effect on B2 receptors from coreg: Strongly prefer in this setting: Bystolic, the most beta -1  selective Beta blocker available in sample form, with bisoprolol the most selective generic choice  on the market.   For now will leave on coreg at Dr Avbuerre's discretion     Each maintenance medication was reviewed in detail including most importantly the difference between maintenance and as needed and under what circumstances the prns are to be used.  Please see instructions for details which were reviewed in writing and the patient given a copy.

## 2013-02-10 ENCOUNTER — Other Ambulatory Visit: Payer: Self-pay | Admitting: Nephrology

## 2013-02-16 ENCOUNTER — Ambulatory Visit
Admission: RE | Admit: 2013-02-16 | Discharge: 2013-02-16 | Disposition: A | Discharge status: Home / Self Care | Payer: Medicare Other | Source: Ambulatory Visit | Attending: Nephrology | Admitting: Nephrology

## 2013-02-24 ENCOUNTER — Telehealth: Payer: Self-pay | Admitting: Internal Medicine

## 2013-02-24 MED ORDER — ACLIDINIUM BROMIDE 400 MCG/ACT IN AEPB: 1.0000 | INHALATION_SPRAY | Freq: Two times a day (BID) | RESPIRATORY_TRACT | Status: DC

## 2013-02-24 NOTE — Telephone Encounter (Signed)
No changes for now - you can't trust the stuff you see on TV and as long as I am her lung doctor she shouldn't make changes in respiratory medications (to do so risks very bad outcomes much worse than what she's hearing on TV) without office visit to discuss risks vs benefit face to face.

## 2013-02-24 NOTE — Telephone Encounter (Signed)
I spoke with pt and made her aware. She stated she will discuss this with him when she comes in. She did not have appt scheduled. She stated she will call back for appt. She needed nothing further

## 2013-02-24 NOTE — Telephone Encounter (Signed)
I spoke with pt. She stated she saw on TV that symbicort has caused some people "damage" to there body. She said she saw there is a Electrical engineer for this. She is wanting to know if she can change bc now she is worried about this. Pt also needed tudorza sent and I have done so. Please advise regarding symbicort Dr. Melvyn Novas thanks  Allergies  Allergen Reactions  . Peanuts [Peanut Oil] Swelling

## 2013-03-16 ENCOUNTER — Other Ambulatory Visit (HOSPITAL_COMMUNITY): Payer: Self-pay | Admitting: Internal Medicine

## 2013-03-16 DIAGNOSIS — Z1231 Encounter for screening mammogram for malignant neoplasm of breast: Secondary | ICD-10-CM

## 2013-03-17 ENCOUNTER — Telehealth: Payer: Self-pay | Admitting: Internal Medicine

## 2013-03-17 MED ORDER — TIOTROPIUM BROMIDE MONOHYDRATE 18 MCG IN CAPS: 18.0000 ug | ORAL_CAPSULE | Freq: Every day | RESPIRATORY_TRACT | Status: DC

## 2013-03-17 NOTE — Telephone Encounter (Signed)
I spoke with the pt and she states that insurance is not covering [place]. Pt is interested in changing this med to something else. Please advise if able to change or does PA need to be done? Larimore Bing, CMA

## 2013-03-17 NOTE — Telephone Encounter (Signed)
Pt wants to try spiriva, she requests rx be sent. [place] Bing, CMA

## 2013-03-17 NOTE — Telephone Encounter (Signed)
Ok to try spiriva one capsule daily or give enough samples of tudorza until next ov

## 2013-04-20 ENCOUNTER — Ambulatory Visit (HOSPITAL_COMMUNITY)
Admission: RE | Admit: 2013-04-20 | Discharge: 2013-04-20 | Disposition: A | Discharge status: Home / Self Care | Payer: Medicare Other | Source: Ambulatory Visit | Attending: Internal Medicine | Admitting: Internal Medicine

## 2013-04-20 DIAGNOSIS — Z1231 Encounter for screening mammogram for malignant neoplasm of breast: Secondary | ICD-10-CM | POA: Insufficient documentation

## 2013-04-27 ENCOUNTER — Ambulatory Visit (INDEPENDENT_AMBULATORY_CARE_PROVIDER_SITE_OTHER): Payer: Medicare Other | Admitting: Internal Medicine

## 2013-04-27 ENCOUNTER — Encounter: Payer: Self-pay | Admitting: Internal Medicine

## 2013-04-27 VITALS — BP 126/80 | HR 68 | Temp 98.1°F | Ht 63.0 in | Wt 210.0 lb

## 2013-04-27 DIAGNOSIS — J449 Chronic obstructive pulmonary disease, unspecified: Secondary | ICD-10-CM

## 2013-04-27 DIAGNOSIS — I10 Essential (primary) hypertension: Secondary | ICD-10-CM

## 2013-04-27 MED ORDER — NEBIVOLOL HCL 10 MG PO TABS: ORAL_TABLET | ORAL | Status: DC

## 2013-04-27 MED ORDER — NEBIVOLOL HCL 10 MG PO TABS: 10.0000 mg | ORAL_TABLET | Freq: Every day | ORAL | Status: DC

## 2013-04-27 MED ORDER — ACLIDINIUM BROMIDE 400 MCG/ACT IN AEPB: 1.0000 | INHALATION_SPRAY | Freq: Two times a day (BID) | RESPIRATORY_TRACT | Status: DC

## 2013-04-27 NOTE — Patient Instructions (Addendum)
symbicort 160 Take 2 puffs first thing in am and then another 2 puffs about 12 hours later.   Continue tudorza one twice daily for now  Work on inhaler technique:  relax and gently blow all the way out then take a nice smooth deep breath back in, triggering the inhaler at same time you start breathing in.  Hold for up to 5 seconds if you can.  Rinse and gargle with water when done      Only use your albuterol (proaire first, nebulizer is your second line) as a rescue medication to be used if you can't catch your breath by resting or doing a relaxed purse lip breathing pattern.  - The less you use it, the better it will work when you need it. - Ok to use up to every 4 hours if you must but call for immediate appointment if use goes up over your usual need - Don't leave home without it !!  (think of it like your spare tire for your car)   Try bystolic 10 mg twice daily in place of coreg until you see Dr Jackson Latino.  Please schedule a follow up office visit in 6 weeks, call sooner if needed

## 2013-04-27 NOTE — Assessment & Plan Note (Addendum)
-   PFT's 11/10/2012 FEV1 0.85 (52%)ratio 56 and no better p B 2 with DLCO 34 corrects to 60 - doe x 5-10 min stationery bike > add tudorza 11/10/12 > improved  DDX of  difficult airways managment all start with A and  include Adherence, Ace Inhibitors, Acid Reflux, Active Sinus Disease, Alpha 1 Antitripsin deficiency, Anxiety masquerading as Airways dz,  ABPA,  allergy(esp in young), Aspiration (esp in elderly), Adverse effects of DPI,  Active smokers, plus two Bs  = Bronchiectasis and Beta blocker use..and one C= CHF  Adherence is always the initial "prime suspect" and is a multilayered concern that requires a "trust but verify" approach in every patient - starting with knowing how to use medications, especially inhalers, correctly, keeping up with refills and understanding the fundamental difference between maintenance and prns vs those medications only taken for a very short course and then stopped and not refilled.  - having problems keeping up with meds/ expenses/ insurance restrictions - The proper method of use, as well as anticipated side effects, of a metered-dose inhaler are discussed and demonstrated to the patient. Improved effectiveness after extensive coaching during this visit to a level of approximately  75% so continue symbicort/ tudorza for now  ? Beta blocker effect > Strongly prefer in this setting: Bystolic, the most beta -1  selective Beta blocker available in sample form, with bisoprolol the most selective generic choice  on the market.     Each maintenance medication was reviewed in detail including most importantly the difference between maintenance and as needed and under what circumstances the prns are to be used.  Please see instructions for details which were reviewed in writing and the patient given a copy.

## 2013-04-27 NOTE — Progress Notes (Signed)
Subjective:    Patient ID: Mallory Chavez, female    DOB: [birth year] MRN: [id number]    Brief patient profile:  39 yowbf quit smoking around 1994 bothered by some seasonal rhinitis spring > fall x around 1990 then 2012 developed breathing problems dx asthma variably responsive to rx referred 10/01/2012 to pulmonary clinic by Dr Jeanie Cooks and proved to have GOLD II COPD 10/2012  HPI 10/01/2012 1st pulmonary /Mallory Chavez  On ACEi cc abruptly 2012 shortness of breath assoc with cough min productive mostly clear. Sob x across a parking lot. Not responding to multiple asthma meds. rec benicar 40/25 one half daily in place of lisnopril GERD diet   11/10/2012 f/u ov/Mallory Chavez re cough/ copd Chief Complaint  Patient presents with  . Follow-up    Pt states breathing has improved some since the last visit. She c/o having some swelling in her fett for the past 4 wks.   no need for albuterol at all, some throat clearing needed in ams, still doe x 5-10 min bike rec Add turdorza one puff twice daily after dulera     12/19/2012 f/u ov/Mallory Chavez re copd Chief Complaint  Patient presents with  . Followup with cxr    Pt states her breathing continues to improve as well as her edema in feet. Her sats when arrived were 80%ra    no longer coughing. Walking 30 min daily  Using dulera 100 2bid and rarely the rescue, has neb doesn't use it.  rec Stop cod liver oil  And fish oil > eat more fish instead especially baked or broiled wild salmon > if arthritis comes back take the cod liver oil with breakfast Stop dulera Start symbicort 160 2 puffs every 12 hours      01/16/2013 f/u ov/Mallory Chavez re GOLD II copd with ab component  Chief Complaint  Patient presents with  . Follow-up    Pt states breathing continues to improve. She c/o hoarseness and throat clearing for the past 3 days- sometimes able to produce minimal brown sputum.    not as limited from desired activities, min use of saba  rec Add pepcid 20 mg at bedtime as  long as you are coughing in the am For cough take mucinex dm 1200 every 12 hours (robitussin dm) zpak called in  GERD diet   Please schedule a follow up visit in 3 months but call sooner if needed  Late add: low treshold to d/c coreg > bisoprolol or bystolic    [id number] f/u ov/Mallory Chavez re: GOLD II COPD with AB component Chief Complaint  Patient presents with  . Follow-up    Pt states having increased SOB for the past couple of days- using proair 3 times daily. She also c/o coughing and wheezing for the past 2 days- cough is non prod.   ran out out symbicort x sev days and usual amt of saba increased from bid to qid No cough  On coreg 25 bid  Cough and sob flared about the same time.  Sob with slow adls better p saba Cough min white mucus production   No obvious daytime variabilty to symptoms or  cp or chest tightness, subjective wheeze overt sinus or hb symptoms. No unusual exp hx or h/o childhood pna/ asthma or premature birth to her  knowledge.   Sleeping ok without nocturnal  or early am exacerbation  of respiratory  c/o's or need for noct saba. Also denies any obvious fluctuation of symptoms with weather or environmental changes or other  aggravating or alleviating factors except as outlined above   Current Medications, Allergies, Past Medical History, Past Surgical History, Family History, and Social History were reviewed in [employer] record.  ROS  The following are not active complaints unless bolded sore throat, dysphagia, dental problems, itching, sneezing,  nasal congestion or excess/ purulent secretions, ear ache,   fever, chills, sweats, unintended wt loss, pleuritic or exertional cp, hemoptysis,  orthopnea pnd or leg swelling, presyncope, palpitations, heartburn, abdominal pain, anorexia, nausea, vomiting, diarrhea  or change in bowel or urinary habits, change in stools or urine, dysuria,hematuria,  rash, arthralgias, visual complaints, headache, numbness  weakness or ataxia or problems with walking or coordination,  change in mood/affect or memory.                 Objective:   Physical Exam  11/10/2012       206 vs 12/19/2012 206  Vs 207 01/16/2013 > 04/27/2013  210  Wt Readings from Last 3 Encounters:  10/01/12 207 lb (93.895 kg)  12/21/11 197 lb (89.359 kg)  12/07/11 197 lb (89.359 kg)     HEENT:  Upper and lower partial plate , turbinates, and orophanx. Nl external ear canals without cough reflex   NECK :  without JVD/Nodes/TM/ nl carotid upstrokes bilaterally   LUNGS: no acc muscle use, trace end exp bilateral wheeze with mild increased texp w/in 2 h of last saba   CV:  RRR  no s3 or murmur or increase in P2, trace bilateral sym edema   ABD:  soft and nontender with nl excursion in the supine position. No bruits or organomegaly, bowel sounds nl  MS:  warm without deformities, calf tenderness, cyanosis or clubbing          CXR  12/19/2012 :  Cardiomegaly with vascular congestion and bibasilar atelectasis. 12/29/12 Echo : c/w Grade 1 diastolic dysfunction with nl ef      Assessment & Plan:

## 2013-04-28 NOTE — Assessment & Plan Note (Signed)
Change acei to arb effective 10/01/12 for pseudoasthma > resolved   - Echo 12/29/2012  > Grade I diastolic dysfunction  rec trial of bystolic 10 mg bid over coreg with f/u by cards planned for 05/05/13

## 2013-05-14 ENCOUNTER — Telehealth: Payer: Self-pay | Admitting: Internal Medicine

## 2013-05-14 NOTE — Telephone Encounter (Signed)
Per last OV: Try bystolic 10 mg twice daily in place of coreg until you see Dr Jackson Latino   I called and spoke with pt. She reports she now remembers this and will call Dr. Marlynn Perking since he did not change this when she saw him. Nothing further needed

## 2013-06-01 ENCOUNTER — Ambulatory Visit: Payer: Medicare Other | Admitting: Internal Medicine

## 2013-06-15 ENCOUNTER — Encounter: Payer: Self-pay | Admitting: Internal Medicine

## 2013-06-15 ENCOUNTER — Ambulatory Visit (INDEPENDENT_AMBULATORY_CARE_PROVIDER_SITE_OTHER): Payer: Medicare Other | Admitting: Internal Medicine

## 2013-06-15 VITALS — BP 142/86 | HR 67 | Temp 98.6°F | Ht 63.0 in | Wt 208.0 lb

## 2013-06-15 DIAGNOSIS — J449 Chronic obstructive pulmonary disease, unspecified: Secondary | ICD-10-CM

## 2013-06-15 NOTE — Patient Instructions (Signed)
Reduce symbicort 160 to 2 puffs just in am x 2 weeks then try off  If you find at any point you need more proaire restart Symbicort 160 which will start working again 5 min later   See [name] 6  weeks with all your medications, even over the counter meds, separated in two separate bags, the ones you take no matter what vs the ones you stop once you feel better and take only as needed when you feel you need them.   Tammy  will generate for you a new user friendly medication calendar that will put [place] all on the same page re: your medication use.     Without this process, it simply isn't possible to assure that we are providing  your outpatient care  with  the attention to detail we feel you deserve.   If we cannot assure that you're getting that kind of care,  then we cannot manage your problem effectively from this clinic.  Once you have seen Tammy and we are sure that we're all on the same page with your medication use she will arrange follow up with me.

## 2013-06-15 NOTE — Progress Notes (Signed)
Subjective:    Patient ID: Mallory Chavez, female    DOB: [birth year] MRN: [id number]    Brief patient profile:  33 yowbf quit smoking around 1994 bothered by some seasonal rhinitis spring > fall x around 1990 then 2012 developed breathing problems dx asthma variably responsive to rx referred 10/01/2012 to pulmonary clinic by Dr Jeanie Cooks and proved to have GOLD II COPD 10/2012  HPI 10/01/2012 1st pulmonary /Wert  On ACEi cc abruptly 2012 shortness of breath assoc with cough min productive mostly clear. Sob x across a parking lot. Not responding to multiple asthma meds. rec benicar 40/25 one half daily in place of lisnopril GERD diet   11/10/2012 f/u ov/Wert re cough/ copd Chief Complaint  Patient presents with  . Follow-up    Pt states breathing has improved some since the last visit. She c/o having some swelling in her fett for the past 4 wks.   no need for albuterol at all, some throat clearing needed in ams, still doe x 5-10 min bike rec Add turdorza one puff twice daily after dulera     12/19/2012 f/u ov/Wert re copd Chief Complaint  Patient presents with  . Followup with cxr    Pt states her breathing continues to improve as well as her edema in feet. Her sats when arrived were 80%ra    no longer coughing. Walking 30 min daily  Using dulera 100 2bid and rarely the rescue, has neb doesn't use it.  rec Stop cod liver oil  And fish oil > eat more fish instead especially baked or broiled wild salmon > if arthritis comes back take the cod liver oil with breakfast Stop dulera Start symbicort 160 2 puffs every 12 hours      01/16/2013 f/u ov/Wert re GOLD II copd with ab component  Chief Complaint  Patient presents with  . Follow-up    Pt states breathing continues to improve. She c/o hoarseness and throat clearing for the past 3 days- sometimes able to produce minimal brown sputum.    not as limited from desired activities, min use of saba  rec Add pepcid 20 mg at bedtime as  long as you are coughing in the am For cough take mucinex dm 1200 every 12 hours (robitussin dm) zpak called in  GERD diet   Please schedule a follow up visit in 3 months but call sooner if needed  Late add: low treshold to d/c coreg > bisoprolol or bystolic    [id number] f/u ov/Wert re: GOLD II COPD with AB component Chief Complaint  Patient presents with  . Follow-up    Pt states having increased SOB for the past couple of days- using proair 3 times daily. She also c/o coughing and wheezing for the past 2 days- cough is non prod.   ran out out symbicort x sev days and usual amt of saba increased from bid to qid No cough  On coreg 25 bid  Cough and sob flared about the same time.  Sob with slow adls better p saba Cough min white mucus production rec symbicort 160 Take 2 puffs first thing in am and then another 2 puffs about 12 hours later.  Continue tudorza one twice daily for now Work on inhaler technique:  Only use your albuterol as rescue  Try bystolic 10 mg twice daily in place of coreg until you see Dr Jackson Latino.    06/15/2013 f/u ov/Wert re: GOLD II/  HBP Chief Complaint  Patient presents with  .  Follow-up    Pt states that her breathing is much improved. Still using rescue inhaler several times per day.   confused with meds, wants off symbiocrt p reading the PI Better p stopped tudorza   No obvious daytime variabilty to symptoms or  Cough/ cp or chest tightness, subjective wheeze overt sinus or hb symptoms. No unusual exp hx or h/o childhood pna/ asthma or premature birth to her  knowledge.   Sleeping ok without nocturnal  or early am exacerbation  of respiratory  c/o's or need for noct saba. Also denies any obvious fluctuation of symptoms with weather or environmental changes or other aggravating or alleviating factors except as outlined above   Current Medications, Allergies, Past Medical History, Past Surgical History, Family History, and Social History were reviewed  in [employer] record.  ROS  The following are not active complaints unless bolded sore throat, dysphagia, dental problems, itching, sneezing,  nasal congestion or excess/ purulent secretions, ear ache,   fever, chills, sweats, unintended wt loss, pleuritic or exertional cp, hemoptysis,  orthopnea pnd or leg swelling, presyncope, palpitations, heartburn, abdominal pain, anorexia, nausea, vomiting, diarrhea  or change in bowel or urinary habits, change in stools or urine, dysuria,hematuria,  rash, arthralgias, visual complaints, headache, numbness weakness or ataxia or problems with walking or coordination,  change in mood/affect or memory.                 Objective:   Physical Exam  11/10/2012       206 vs 12/19/2012 206  Vs 207 01/16/2013 > 04/27/2013  210 > 06/15/2013  208  Wt Readings from Last 3 Encounters:  10/01/12 207 lb (93.895 kg)  12/21/11 197 lb (89.359 kg)  12/07/11 197 lb (89.359 kg)     HEENT:  Upper and lower partial plate , turbinates, and orophanx. Nl external ear canals without cough reflex   NECK :  without JVD/Nodes/TM/ nl carotid upstrokes bilaterally   LUNGS: no acc muscle use, completely clear   CV:  RRR  no s3 or murmur or increase in P2, trace bilateral sym edema   ABD:  soft and nontender with nl excursion in the supine position. No bruits or organomegaly, bowel sounds nl  MS:  warm without deformities, calf tenderness, cyanosis or clubbing          CXR  12/19/2012 : Cardiomegaly with vascular congestion and bibasilar atelectasis.  12/29/12 Echo : c/w Grade 1 diastolic dysfunction with nl ef      Assessment & Plan:

## 2013-06-16 NOTE — Assessment & Plan Note (Signed)
-   PFT's 11/10/2012 FEV1 0.85 (52%)ratio 56 and no better p B 2 with DLCO 34 corrects to 60 - doe x 5-10 min stationery bike > add tudorza 11/10/12 > d/c 05/2013 and no chang - HFA  75% 04/27/2013   I had an extended discussion with the patient today lasting 15 to 20 minutes of a 25 minute visit on the following issues:   1) she's failed lama so all that's left to offer is saba/laba/ ics  2) all laba/ics have the same side effects/ in fact much of the info in PI is class effect and only in it because of other ics/laba concerns  symbicort is the only ics/laba approved for copd that starts working in 5 min, so ok with me if she wants to try to wean and see to what extent her breathing or dep on saba changes, and if so resume the prev dose of symbiocort that worked.  See instructions for specific recommendations which were reviewed directly with the patient who was given a copy with highlighter outlining the key components.   The proper method of use, as well as anticipated side effects, of a metered-dose inhaler are discussed and demonstrated to the patient. Improved effectiveness after extensive coaching during this visit to a level of approximately  75%

## 2013-07-27 ENCOUNTER — Encounter: Payer: Self-pay | Admitting: Adult Health

## 2013-07-27 ENCOUNTER — Ambulatory Visit (INDEPENDENT_AMBULATORY_CARE_PROVIDER_SITE_OTHER): Payer: Medicare Other | Admitting: Adult Health

## 2013-07-27 VITALS — BP 130/64 | HR 65 | Temp 98.3°F | Ht 63.0 in | Wt 211.4 lb

## 2013-07-27 DIAGNOSIS — J449 Chronic obstructive pulmonary disease, unspecified: Secondary | ICD-10-CM

## 2013-07-27 NOTE — Assessment & Plan Note (Addendum)
COPD, compensated on present regimen. Patient is currently not on any maintenance medications as she has not perceived any significant benefit. Have advised her that if she starts to use her rescue inhaler more than her usual. That she will be sooner follow up to for consideration of restarting, a maintenance regimen. Patient's medications were reviewed today and patient education was given. Computerized medication calendar was adjusted/completed   Plan  Continue on current regimen and followup with Dr. Melvyn Novas in 3 months. Follow med calendar closely and bring to each visit. If you see that your breathing is worsening or your using your pro air more than your usual. Call our office for sooner followup

## 2013-07-27 NOTE — Patient Instructions (Signed)
Continue on current regimen and followup with Dr. Melvyn Novas in 3 months. Follow med calendar closely and bring to each visit. If you see that your breathing is worsening or your using your pro air more than your usual. Call our office for sooner followup

## 2013-07-27 NOTE — Progress Notes (Signed)
Subjective:    Patient ID: Mallory Chavez, female    DOB: 04/17/1936 MRN: [id number]  Brief patient profile:  10 yowbf quit smoking around 1994 bothered by some seasonal rhinitis spring > fall x around 1990 then 2012 developed breathing problems dx asthma variably responsive to rx referred 10/01/2012 to pulmonary clinic by Dr Jeanie Cooks and proved to have GOLD II COPD 10/2012  HPI 10/01/2012 1st pulmonary /Wert  On ACEi cc abruptly 2012 shortness of breath assoc with cough min productive mostly clear. Sob x across a parking lot. Not responding to multiple asthma meds. rec benicar 40/25 one half daily in place of lisnopril GERD diet   11/10/2012 f/u ov/Wert re cough/ copd Chief Complaint  Patient presents with  . Follow-up    Pt states breathing has improved some since the last visit. She c/o having some swelling in her fett for the past 4 wks.   no need for albuterol at all, some throat clearing needed in ams, still doe x 5-10 min bike rec Add turdorza one puff twice daily after dulera     12/19/2012 f/u ov/Wert re copd Chief Complaint  Patient presents with  . Followup with cxr    Pt states her breathing continues to improve as well as her edema in feet. Her sats when arrived were 80%ra    no longer coughing. Walking 30 min daily  Using dulera 100 2bid and rarely the rescue, has neb doesn't use it.  rec Stop cod liver oil  And fish oil > eat more fish instead especially baked or broiled wild salmon > if arthritis comes back take the cod liver oil with breakfast Stop dulera Start symbicort 160 2 puffs every 12 hours      01/16/2013 f/u ov/Wert re GOLD II copd with ab component  Chief Complaint  Patient presents with  . Follow-up    Pt states breathing continues to improve. She c/o hoarseness and throat clearing for the past 3 days- sometimes able to produce minimal brown sputum.    not as limited from desired activities, min use of saba  rec Add pepcid 20 mg at bedtime as long  as you are coughing in the am For cough take mucinex dm 1200 every 12 hours (robitussin dm) zpak called in  GERD diet   Please schedule a follow up visit in 3 months but call sooner if needed  Late add: low treshold to d/c coreg > bisoprolol or bystolic    [id number] f/u ov/Wert re: GOLD II COPD with AB component Chief Complaint  Patient presents with  . Follow-up    Pt states having increased SOB for the past couple of days- using proair 3 times daily. She also c/o coughing and wheezing for the past 2 days- cough is non prod.   ran out out symbicort x sev days and usual amt of saba increased from bid to qid No cough  On coreg 25 bid  Cough and sob flared about the same time.  Sob with slow adls better p saba Cough min white mucus production rec symbicort 160 Take 2 puffs first thing in am and then another 2 puffs about 12 hours later.  Continue tudorza one twice daily for now Work on inhaler technique:  Only use your albuterol as rescue  Try bystolic 10 mg twice daily in place of coreg until you see Dr Jackson Latino.    06/15/2013 f/u ov/Wert re: GOLD II/  HBP Chief Complaint  Patient presents with  . Follow-up  Pt states that her breathing is much improved. Still using rescue inhaler several times per day.   confused with meds, wants off symbiocrt p reading the PI Better p stopped tudorza >>tapered off symbicort   07/27/2013 Follow up and med review  Patient returns for a one-month followup, and medication review. Reviewed all her medications and organized them into a medication calendar with patient education. It appears the patient is taking her medications correctly. Last visit she tapered off  symbicort last ov without flare of cough or dyspnea.  She has been tried on [place] without perceived benefit.  She uses her pro air on average 2-3 times a week. No emergency room or hospitalizations  Over the last 6 months.     Current Medications, Allergies, Past  Medical History, Past Surgical History, Family History, and Social History were reviewed in [employer] record.  ROS  The following are not active complaints unless bolded sore throat, dysphagia, dental problems, itching, sneezing,  nasal congestion or excess/ purulent secretions, ear ache,   fever, chills, sweats, unintended wt loss, pleuritic or exertional cp, hemoptysis,  orthopnea pnd or leg swelling, presyncope, palpitations, heartburn, abdominal pain, anorexia, nausea, vomiting, diarrhea  or change in bowel or urinary habits, change in stools or urine, dysuria,hematuria,  rash, arthralgias, visual complaints, headache, numbness weakness or ataxia or problems with walking or coordination,  change in mood/affect or memory.                 Objective:   Physical Exam  11/10/2012       206 vs 12/19/2012 206  Vs 207 01/16/2013 > 04/27/2013  210 > 06/15/2013  208  >211 07/27/2013     HEENT:  Upper and lower partial plate , turbinates, and orophanx. Nl external ear canals without cough reflex   NECK :  without JVD/Nodes/TM/ nl carotid upstrokes bilaterally   LUNGS: no acc muscle use, completely clear   CV:  RRR  no s3 or murmur or increase in P2, trace bilateral sym edema   ABD:  soft and nontender with nl excursion in the supine position. No bruits or organomegaly, bowel sounds nl  MS:  warm without deformities, calf tenderness, cyanosis or clubbing          CXR  12/19/2012 : Cardiomegaly with vascular congestion and bibasilar atelectasis.  12/29/12 Echo : c/w Grade 1 diastolic dysfunction with nl ef      Assessment & Plan:

## 2013-08-04 NOTE — Addendum Note (Signed)
Addended by: Parke Poisson E on: 08/04/2013 03:35 PM   Modules accepted: Orders, Medications

## 2014-02-03 ENCOUNTER — Other Ambulatory Visit: Payer: Self-pay | Admitting: Internal Medicine

## 2014-03-17 ENCOUNTER — Other Ambulatory Visit: Payer: Self-pay | Admitting: Internal Medicine

## 2014-03-25 ENCOUNTER — Other Ambulatory Visit (HOSPITAL_COMMUNITY): Payer: Self-pay | Admitting: Internal Medicine

## 2014-03-25 DIAGNOSIS — Z1231 Encounter for screening mammogram for malignant neoplasm of breast: Secondary | ICD-10-CM

## 2014-03-30 ENCOUNTER — Ambulatory Visit: Payer: Medicare Other | Admitting: Internal Medicine

## 2014-04-16 ENCOUNTER — Encounter: Payer: Self-pay | Admitting: Internal Medicine

## 2014-04-19 ENCOUNTER — Encounter: Payer: Self-pay | Admitting: Internal Medicine

## 2014-04-19 ENCOUNTER — Ambulatory Visit (INDEPENDENT_AMBULATORY_CARE_PROVIDER_SITE_OTHER): Payer: Medicare Other | Admitting: Internal Medicine

## 2014-04-19 ENCOUNTER — Ambulatory Visit (INDEPENDENT_AMBULATORY_CARE_PROVIDER_SITE_OTHER)
Admission: RE | Admit: 2014-04-19 | Discharge: 2014-04-19 | Disposition: A | Discharge status: Home / Self Care | Payer: Medicare Other | Source: Ambulatory Visit | Attending: Internal Medicine | Admitting: Internal Medicine

## 2014-04-19 VITALS — BP 144/86 | HR 66 | Temp 98.2°F | Ht 64.0 in | Wt 201.0 lb

## 2014-04-19 DIAGNOSIS — J449 Chronic obstructive pulmonary disease, unspecified: Secondary | ICD-10-CM

## 2014-04-19 DIAGNOSIS — R0902 Hypoxemia: Secondary | ICD-10-CM | POA: Diagnosis not present

## 2014-04-19 NOTE — Progress Notes (Signed)
Subjective:    Patient ID: Mallory Chavez, female    DOB: 1935/07/14 MRN: [id number]  Brief patient profile:  82 yowbf quit smoking around 1994 bothered by some seasonal rhinitis spring > fall x around 1990 then 2012 developed breathing problems dx asthma variably responsive to rx referred 10/01/2012 to pulmonary clinic by Dr Jeanie Cooks and proved to have GOLD II COPD 10/2012  HPI 10/01/2012 1st pulmonary /Lux Skilton  On ACEi cc abruptly 2012 shortness of breath assoc with cough min productive mostly clear. Sob x across a parking lot. Not responding to multiple asthma meds. rec benicar 40/25 one half daily in place of lisnopril GERD diet   11/10/2012 f/u ov/[name] re cough/ copd Chief Complaint  Patient presents with  . Follow-up    Pt states breathing has improved some since the last visit. She c/o having some swelling in her fett for the past 4 wks.   no need for albuterol at all, some throat clearing needed in ams, still doe x 5-10 min bike rec Add turdorza one puff twice daily after dulera     12/19/2012 f/u ov/[name] re copd Chief Complaint  Patient presents with  . Followup with cxr    Pt states her breathing continues to improve as well as her edema in feet. Her sats when arrived were 80%ra    no longer coughing. Walking 30 min daily  Using dulera 100 2bid and rarely the rescue, has neb doesn't use it.  rec Stop cod liver oil  And fish oil > eat more fish instead especially baked or broiled wild salmon > if arthritis comes back take the cod liver oil with breakfast Stop dulera Start symbicort 160 2 puffs every 12 hours      01/16/2013 f/u ov/[name] re GOLD II copd with ab component  Chief Complaint  Patient presents with  . Follow-up    Pt states breathing continues to improve. She c/o hoarseness and throat clearing for the past 3 days- sometimes able to produce minimal brown sputum.    not as limited from desired activities, min use of saba  rec Add pepcid 20 mg at bedtime as long  as you are coughing in the am For cough take mucinex dm 1200 every 12 hours (robitussin dm) zpak called in  GERD diet   Please schedule a follow up visit in 3 months but call sooner if needed  Late add: low treshold to d/c coreg > bisoprolol or bystolic    [id number] f/u ov/[name] re: GOLD II COPD with AB component Chief Complaint  Patient presents with  . Follow-up    Pt states having increased SOB for the past couple of days- using proair 3 times daily. She also c/o coughing and wheezing for the past 2 days- cough is non prod.   ran out out symbicort x sev days and usual amt of saba increased from bid to qid No cough  On coreg 25 bid  Cough and sob flared about the same time.  Sob with slow adls better p saba Cough min white mucus production rec symbicort 160 Take 2 puffs first thing in am and then another 2 puffs about 12 hours later.  Continue tudorza one twice daily for now Work on inhaler technique:  Only use your albuterol as rescue  Try bystolic 10 mg twice daily in place of coreg until you see Dr Jackson Latino.    06/15/2013 f/u ov/[name] re: GOLD II/  HBP Chief Complaint  Patient presents with  . Follow-up  Pt states that her breathing is much improved. Still using rescue inhaler several times per day.   confused with meds, wants off symbiocrt p reading the PI Better p stopped tudorza >>tapered off symbicort   07/27/2013 Follow up and med review  Patient returns for a one-month followup, and medication review. Reviewed all her medications and organized them into a medication calendar with patient education. It appears the patient is taking her medications correctly. Last visit she tapered off  symbicort last ov without flare of cough or dyspnea.  She has been tried on [place] without perceived benefit.  She uses her pro air on average 2-3 times a week. No emergency room or hospitalizations   rec Continue on current regimen and followup with Dr. Melvyn Novas in 3  months. Follow med calendar closely and bring to each visit> did not return as rec    04/19/2014 acute  ov/[name] re: GOLD II  copd/ no med calendar/ not really clear on how to use meds  Chief Complaint  Patient presents with  . Acute Visit    Pt c/o increased SOB and wheezing x 2 wks.  She is SOB mainly with exertion such as walking from room to room at home. She is using her proair 2-3 times per day.   baseline doe/fatigue HC parking/ [name] or [employer]  Now sob across room  Nose runs some, clariton helps  Only using symbicort once a day  Onset of worsening was indolent/progressive, some better p saba   No obvious day to day or daytime variabilty or assoc chronic cough or cp or chest tightness,  overt sinus or hb symptoms. No unusual exp hx or h/o childhood pna/ asthma or knowledge of premature birth.  Sleeping ok without nocturnal  or early am exacerbation  of respiratory  c/o's or need for noct saba. Also denies any obvious fluctuation of symptoms with weather or environmental changes or other aggravating or alleviating factors except as outlined above   Current Medications, Allergies, Complete Past Medical History, Past Surgical History, Family History, and Social History were reviewed in [employer] record.  ROS  The following are not active complaints unless bolded sore throat, dysphagia, dental problems, itching, sneezing,  nasal congestion or excess/ purulent secretions, ear ache,   fever, chills, sweats, unintended wt loss, pleuritic or exertional cp, hemoptysis,  orthopnea pnd or leg swelling, presyncope, palpitations, heartburn, abdominal pain, anorexia, nausea, vomiting, diarrhea  or change in bowel or urinary habits, change in stools or urine, dysuria,hematuria,  rash, arthralgias, visual complaints, headache, numbness weakness or ataxia or problems with walking or coordination,  change in mood/affect or memory.                         Objective:   Physical Exam  11/10/2012       206 vs 12/19/2012 206  Vs 207 01/16/2013 > 04/27/2013  210 > 06/15/2013  208  >211 07/27/2013 > 04/19/14 201     HEENT:  Upper and lower partial plate , turbinates, and orophanx. Nl external ear canals without cough reflex   NECK :  without JVD/Nodes/TM/ nl carotid upstrokes bilaterally   LUNGS: no acc muscle use, mid exp wheeze bilaterally   CV:  RRR  no s3 or murmur or increase in P2, trace bilateral sym edema   ABD:  soft and nontender with nl excursion in the supine position. No bruits or organomegaly, bowel sounds nl  MS:  warm without deformities, calf tenderness, cyanosis or clubbing          CXR  04/19/2014 : Stable cardiomegaly with vascular congestion.   12/29/12 Echo : c/w Grade 1 diastolic dysfunction with nl ef      Assessment & Plan:

## 2014-04-19 NOTE — Patient Instructions (Addendum)
Increase symbicort 160 to Take 2 puffs first thing in am and then another 2 puffs about 12 hours later.   Only use your albuterol (proair) as a rescue medication to be used if you can't catch your breath by resting or doing a relaxed purse lip breathing pattern.  - The less you use it, the better it will work when you need it. - Ok to use up to 2 puffs  every 4 hours if you must but call for immediate appointment if use goes up over your usual need - Don't leave home without it !!  (think of it like the spare tire for your car)   Work on inhaler technique:  relax and gently blow all the way out then take a nice smooth deep breath back in, triggering the inhaler at same time you start breathing in.  Hold for up to 5 seconds if you can.  Rinse and gargle with water when done  Please see patient coordinator before you leave today  to schedule 02 4lpm with exertion and overnight 02 sat on room air     Please remember to go to the   x-ray department downstairs for your tests - we will call you with the results when they are available.  See [name]  in 2 weeks with all your medications, even over the counter meds, separated in two separate bags, the ones you take no matter what vs the ones you stop once you feel better and take only as needed when you feel you need them.   Tammy  will generate for you a new user friendly medication calendar that will put [place] all on the same page re: your medication use.     Without this process, it simply isn't possible to assure that we are providing  your outpatient care  with  the attention to detail we feel you deserve.   If we cannot assure that you're getting that kind of care,  then we cannot manage your problem effectively from this clinic.  Once you have seen Tammy and we are sure that we're all on the same page with your medication use she will arrange follow up with me.

## 2014-04-20 NOTE — Assessment & Plan Note (Addendum)
04/19/2014   Walked RA x one lap @ 185 stopped due to sob/ slow pace/ sat 80%   > ok on 4lpm x one lap - ONO RA 04/20/2014 >>>  rec 4lpm with exertion pending ono RA

## 2014-04-20 NOTE — Assessment & Plan Note (Addendum)
-   PFT's 11/10/2012 FEV1 0.85 (52%)ratio 56 and no better p B 2 with DLCO 34 corrects to 60 - doe x 5-10 min stationery bike > add tudorza 11/10/12 > d/c 05/2013 and no chang - HFA  75% 04/27/2013  -stopped symbicort with no perceived benefit 06/15/13  -med calendar 07/27/2013 > not using 1019/15   DDX of  difficult airways management all start with A and  include Adherence, Ace Inhibitors, Acid Reflux, Active Sinus Disease, Alpha 1 Antitripsin deficiency, Anxiety masquerading as Airways dz,  ABPA,  allergy(esp in young), Aspiration (esp in elderly), Adverse effects of DPI,  Active smokers, plus two Bs  = Bronchiectasis and Beta blocker use..and one C= CHF  Adherence is always the initial "prime suspect" and is a multilayered concern that requires a "trust but verify" approach in every patient - starting with knowing how to use medications, especially inhalers, correctly, keeping up with refills and understanding the fundamental difference between maintenance and prns vs those medications only taken for a very short course and then stopped and not refilled.   - needs to use med calendar - The proper method of use, as well as anticipated side effects, of a metered-dose inhaler are discussed and demonstrated to the patient. Improved effectiveness after extensive coaching during this visit to a level of approximately  75% needs to work on technique or change to dpi next ov ? anoro best choice  Here  If not coughing and hfa still not optimal   ? Chf/ cardiac asthma > has diastolic dyfunction and not clear she's taking her bp meds correctly   To keep things simple, I have asked the patient to first separate medicines that are perceived as maintenance, that is to be taken daily "no matter what", from those medicines that are taken on only on an as-needed basis and I have given the patient examples of both, and then return to see our NP to generate a  detailed  medication calendar which should be followed until  the next physician sees the patient and updates it.

## 2014-04-23 ENCOUNTER — Ambulatory Visit (HOSPITAL_COMMUNITY): Payer: Medicare Other

## 2014-04-26 ENCOUNTER — Encounter: Payer: Self-pay | Admitting: Internal Medicine

## 2014-04-26 ENCOUNTER — Telehealth: Payer: Self-pay | Admitting: Internal Medicine

## 2014-04-26 DIAGNOSIS — J9611 Chronic respiratory failure with hypoxia: Secondary | ICD-10-CM | POA: Insufficient documentation

## 2014-04-26 NOTE — Telephone Encounter (Signed)
Per MW- ONO on RA was pos for desat- needs to start noct o2 2lpm and recheck ONO on 2lpm  Spoke with pt and notified of results per Dr. Melvyn Novas. Pt verbalized understanding and denied any questions. Orders sent to [facility]

## 2014-04-30 ENCOUNTER — Ambulatory Visit (HOSPITAL_COMMUNITY)
Admission: RE | Admit: 2014-04-30 | Discharge: 2014-04-30 | Disposition: A | Discharge status: Home / Self Care | Payer: Medicare Other | Source: Ambulatory Visit | Attending: Internal Medicine | Admitting: Internal Medicine

## 2014-04-30 DIAGNOSIS — Z1231 Encounter for screening mammogram for malignant neoplasm of breast: Secondary | ICD-10-CM | POA: Diagnosis present

## 2014-05-03 ENCOUNTER — Encounter: Payer: Medicare Other | Admitting: Adult Health

## 2014-05-10 ENCOUNTER — Encounter: Payer: Self-pay | Admitting: Adult Health

## 2014-05-10 ENCOUNTER — Ambulatory Visit (INDEPENDENT_AMBULATORY_CARE_PROVIDER_SITE_OTHER): Payer: Medicare Other | Admitting: Adult Health

## 2014-05-10 VITALS — BP 136/84 | HR 73 | Temp 96.9°F | Ht 64.0 in | Wt 203.0 lb

## 2014-05-10 DIAGNOSIS — J449 Chronic obstructive pulmonary disease, unspecified: Secondary | ICD-10-CM

## 2014-05-10 DIAGNOSIS — J9611 Chronic respiratory failure with hypoxia: Secondary | ICD-10-CM

## 2014-05-10 NOTE — Assessment & Plan Note (Signed)
Ambulatory desats and nocturnal desats  Plan  Begin Oxygen 2l/m At bedtime   Continue with Oxygen 4l/m with activity.  Continue on current regimen. Follow up with Dr. Melvyn Novas  In 6 weeks and As needed   Please contact office for sooner follow up if symptoms do not improve or worsen or seek emergency care

## 2014-05-10 NOTE — Patient Instructions (Addendum)
Begin Oxygen 2l/m At bedtime   Continue with Oxygen 4l/m with activity.  Continue on current regimen. Follow up with Dr. Melvyn Novas  In 6 weeks and As needed   Please contact office for sooner follow up if symptoms do not improve or worsen or seek emergency care

## 2014-05-10 NOTE — Progress Notes (Signed)
Subjective:    Patient ID: Mallory Chavez, female    DOB: [birth year] MRN: [id number]  Brief patient profile:  17 yowbf quit smoking around 1994 bothered by some seasonal rhinitis spring > fall x around 1990 then 2012 developed breathing problems dx asthma variably responsive to rx referred 10/01/2012 to pulmonary clinic by Dr Jeanie Cooks and proved to have GOLD II COPD 10/2012  HPI 10/01/2012 1st pulmonary /Wert  On ACEi cc abruptly 2012 shortness of breath assoc with cough min productive mostly clear. Sob x across a parking lot. Not responding to multiple asthma meds. rec benicar 40/25 one half daily in place of lisnopril GERD diet   11/10/2012 f/u ov/Wert re cough/ copd Chief Complaint  Patient presents with  . Follow-up    Pt states breathing has improved some since the last visit. She c/o having some swelling in her fett for the past 4 wks.   no need for albuterol at all, some throat clearing needed in ams, still doe x 5-10 min bike rec Add turdorza one puff twice daily after dulera     12/19/2012 f/u ov/Wert re copd Chief Complaint  Patient presents with  . Followup with cxr    Pt states her breathing continues to improve as well as her edema in feet. Her sats when arrived were 80%ra    no longer coughing. Walking 30 min daily  Using dulera 100 2bid and rarely the rescue, has neb doesn't use it.  rec Stop cod liver oil  And fish oil > eat more fish instead especially baked or broiled wild salmon > if arthritis comes back take the cod liver oil with breakfast Stop dulera Start symbicort 160 2 puffs every 12 hours      01/16/2013 f/u ov/Wert re GOLD II copd with ab component  Chief Complaint  Patient presents with  . Follow-up    Pt states breathing continues to improve. She c/o hoarseness and throat clearing for the past 3 days- sometimes able to produce minimal brown sputum.    not as limited from desired activities, min use of saba  rec Add pepcid 20 mg at bedtime as long  as you are coughing in the am For cough take mucinex dm 1200 every 12 hours (robitussin dm) zpak called in  GERD diet   Please schedule a follow up visit in 3 months but call sooner if needed  Late add: low treshold to d/c coreg > bisoprolol or bystolic    [id number] f/u ov/Wert re: GOLD II COPD with AB component Chief Complaint  Patient presents with  . Follow-up    Pt states having increased SOB for the past couple of days- using proair 3 times daily. She also c/o coughing and wheezing for the past 2 days- cough is non prod.   ran out out symbicort x sev days and usual amt of saba increased from bid to qid No cough  On coreg 25 bid  Cough and sob flared about the same time.  Sob with slow adls better p saba Cough min white mucus production rec symbicort 160 Take 2 puffs first thing in am and then another 2 puffs about 12 hours later.  Continue tudorza one twice daily for now Work on inhaler technique:  Only use your albuterol as rescue  Try bystolic 10 mg twice daily in place of coreg until you see Dr Jackson Latino.    06/15/2013 f/u ov/Wert re: GOLD II/  HBP Chief Complaint  Patient presents with  . Follow-up  Pt states that her breathing is much improved. Still using rescue inhaler several times per day.   confused with meds, wants off symbiocrt p reading the PI Better p stopped tudorza >>tapered off symbicort   07/27/2013 Follow up and med review  Patient returns for a one-month followup, and medication review. Reviewed all her medications and organized them into a medication calendar with patient education. It appears the patient is taking her medications correctly. Last visit she tapered off  symbicort last ov without flare of cough or dyspnea.  She has been tried on [place] without perceived benefit.  She uses her pro air on average 2-3 times a week. No emergency room or hospitalizations   rec Continue on current regimen and followup with Dr. Melvyn Novas in 3  months. Follow med calendar closely and bring to each visit> did not return as rec    04/19/2014 acute  ov/Wert re: GOLD II  copd/ no med calendar/ not really clear on how to use meds  Chief Complaint  Patient presents with  . Acute Visit    Pt c/o increased SOB and wheezing x 2 wks.  She is SOB mainly with exertion such as walking from room to room at home. She is using her proair 2-3 times per day.   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  She had an ONO on 10/21 that was positive for most of the night.  We discussed starting on nocturnal O2. She is hesitant but agreed to try it Last ov started on O2 with activity. Says she wears at home but tank is too heavy  To use outside of the home. Order sent for revaluation for light weight system.  Pt did not bring all her meds and is confused about meds she is taking.  Did not have symbicort or pepcid. Not sure if she is taking or not.  Patient denies any chest pain, orthopnea, PND, or increased leg swelling Current Medications, Allergies, Complete Past Medical History, Past Surgical History, Family History, and Social History were reviewed in [employer] record.  ROS  The following are not active complaints unless bolded sore throat, dysphagia, dental problems, itching, sneezing,  nasal congestion or excess/ purulent secretions, ear ache,   fever, chills, sweats, unintended wt loss, pleuritic or exertional cp, hemoptysis,  orthopnea pnd or leg swelling, presyncope, palpitations, heartburn, abdominal pain, anorexia, nausea, vomiting, diarrhea  or change in bowel or urinary  habits, change in stools or urine, dysuria,hematuria,  rash, arthralgias, visual complaints, headache, numbness weakness or ataxia or problems with walking or coordination,  change in mood/affect or memory.                        Objective:   Physical Exam  11/10/2012       206 vs 12/19/2012 206  Vs 207 01/16/2013 > 04/27/2013  210 > 06/15/2013  208  >211 07/27/2013 > 04/19/14 201 >203 05/10/2014     HEENT:  Upper and lower partial plate , turbinates, and orophanx. Nl external ear canals without cough reflex  NECK :  without JVD/Nodes/TM/ nl carotid upstrokes bilaterally   LUNGS: no acc muscle use,  CTA w/ no wheezing   CV:  RRR  no s3 or murmur or increase in P2, trace bilateral sym edema   ABD:  soft and nontender with nl excursion in the supine position. No bruits or organomegaly, bowel sounds nl  MS:  warm without deformities, calf tenderness, cyanosis or clubbing          CXR  04/19/2014 : Stable cardiomegaly with vascular congestion.   12/29/12 Echo : c/w Grade 1 diastolic dysfunction with nl ef      Assessment & Plan:

## 2014-05-10 NOTE — Assessment & Plan Note (Signed)
Stable without flar e Med review with pt education.  Plan  Continue on current regimen. Follow up with Dr. Melvyn Novas  In 6 weeks and As needed   Please contact office for sooner follow up if symptoms do not improve or worsen or seek emergency care

## 2014-05-10 NOTE — Addendum Note (Signed)
Addended by: Parke Poisson E on: 05/10/2014 05:27 PM   Modules accepted: Orders

## 2014-05-21 NOTE — Addendum Note (Signed)
Addended by: Parke Poisson E on: 05/21/2014 01:44 PM   Modules accepted: Orders, Medications

## 2014-07-05 ENCOUNTER — Ambulatory Visit (INDEPENDENT_AMBULATORY_CARE_PROVIDER_SITE_OTHER): Payer: Medicare Other | Admitting: Internal Medicine

## 2014-07-05 ENCOUNTER — Encounter: Payer: Self-pay | Admitting: Internal Medicine

## 2014-07-05 VITALS — BP 136/76 | HR 67 | Ht 63.0 in | Wt 201.0 lb

## 2014-07-05 DIAGNOSIS — J449 Chronic obstructive pulmonary disease, unspecified: Secondary | ICD-10-CM

## 2014-07-05 NOTE — Patient Instructions (Signed)
No change in your meds needed  See [name] in 3 months  with all your medications, even over the counter meds, separated in two separate bags, the ones you take no matter what vs the ones you stop once you feel better and take only as needed when you feel you need them.   Tammy  will generate for you a new user friendly medication calendar that will put [place] all on the same page re: your medication use.     Without this process, it simply isn't possible to assure that we are providing  your outpatient care  with  the attention to detail we feel you deserve.   If we cannot assure that you're getting that kind of care,  then we cannot manage your problem effectively from this clinic.  Once you have seen Tammy and we are sure that we're all on the same page with your medication use she will arrange follow up with me.

## 2014-07-05 NOTE — Progress Notes (Signed)
Subjective:    Patient ID: Mallory Chavez, female    DOB: [birth year] MRN: [id number]  Brief patient profile:  89 yowbf quit smoking around 1994 bothered by some seasonal rhinitis spring > fall x around 1990 then 2012 developed breathing problems dx asthma variably responsive to rx referred 10/01/2012 to pulmonary clinic by Dr Jeanie Cooks and proved to have GOLD II COPD 10/2012  HPI 10/01/2012 1st pulmonary /Cady Hafen  On ACEi cc abruptly 2012 shortness of breath assoc with cough min productive mostly clear. Sob x across a parking lot. Not responding to multiple asthma meds. rec benicar 40/25 one half daily in place of lisnopril GERD diet   11/10/2012 f/u ov/Rozlynn Lippold re cough/ copd Chief Complaint  Patient presents with  . Follow-up    Pt states breathing has improved some since the last visit. She c/o having some swelling in her fett for the past 4 wks.   no need for albuterol at all, some throat clearing needed in ams, still doe x 5-10 min bike rec Add turdorza one puff twice daily after dulera     12/19/2012 f/u ov/Raeya Merritts re copd Chief Complaint  Patient presents with  . Followup with cxr    Pt states her breathing continues to improve as well as her edema in feet. Her sats when arrived were 80%ra    no longer coughing. Walking 30 min daily  Using dulera 100 2bid and rarely the rescue, has neb doesn't use it.  rec Stop cod liver oil  And fish oil > eat more fish instead especially baked or broiled wild salmon > if arthritis comes back take the cod liver oil with breakfast Stop dulera Start symbicort 160 2 puffs every 12 hours      01/16/2013 f/u ov/Axzel Rockhill re GOLD II copd with ab component  Chief Complaint  Patient presents with  . Follow-up    Pt states breathing continues to improve. She c/o hoarseness and throat clearing for the past 3 days- sometimes able to produce minimal brown sputum.    not as limited from desired activities, min use of saba  rec Add pepcid 20 mg at bedtime as long  as you are coughing in the am For cough take mucinex dm 1200 every 12 hours (robitussin dm) zpak called in  GERD diet   Please schedule a follow up visit in 3 months but call sooner if needed  Late add: low treshold to d/c coreg > bisoprolol or bystolic    [id number] f/u ov/Kandice Schmelter re: GOLD II COPD with AB component Chief Complaint  Patient presents with  . Follow-up    Pt states having increased SOB for the past couple of days- using proair 3 times daily. She also c/o coughing and wheezing for the past 2 days- cough is non prod.   ran out out symbicort x sev days and usual amt of saba increased from bid to qid No cough  On coreg 25 bid  Cough and sob flared about the same time.  Sob with slow adls better p saba Cough min white mucus production rec symbicort 160 Take 2 puffs first thing in am and then another 2 puffs about 12 hours later.  Continue tudorza one twice daily for now Work on inhaler technique:  Only use your albuterol as rescue  Try bystolic 10 mg twice daily in place of coreg until you see Dr Jackson Latino.    06/15/2013 f/u ov/Chandlar Staebell re: GOLD II/  HBP Chief Complaint  Patient presents with  . Follow-up  Pt states that her breathing is much improved. Still using rescue inhaler several times per day.   confused with meds, wants off symbiocrt p reading the PI Better p stopped tudorza >>tapered off symbicort   07/27/2013 Follow up and med review  Patient returns for a one-month followup, and medication review. Reviewed all her medications and organized them into a medication calendar with patient education. It appears the patient is taking her medications correctly. Last visit she tapered off  symbicort last ov without flare of cough or dyspnea.  She has been tried on [place] without perceived benefit.  She uses her pro air on average 2-3 times a week. No emergency room or hospitalizations   rec Continue on current regimen and followup with Dr. Melvyn Novas in 3  months. Follow med calendar closely and bring to each visit> did not return as rec    04/19/2014 acute  ov/Amelia Macken re: GOLD II  copd/ no med calendar/ not really clear on how to use meds  Chief Complaint  Patient presents with  . Acute Visit    Pt c/o increased SOB and wheezing x 2 wks.  She is SOB mainly with exertion such as walking from room to room at home. She is using her proair 2-3 times per day.   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  She had an ONO on 10/21 that was positive for most of the night.  We discussed starting on nocturnal O2. She is hesitant but agreed to try it Last ov started on O2 with activity. Says she wears at home but tank is too heavy  To use outside of the home. Order sent for revaluation for light weight system.  Pt did not bring all her meds and is confused about meds she is taking    07/05/2014 f/u ov/Brian Kocourek re: copd/ brought meds / no calendar  Chief Complaint  Patient presents with  . Follow-up    pt reports breathing better, still has sob, wheezing am and at night, cough with clear mucus. She lots of am mucus from nose  denies wheezing at night, does wake up at usual hour and symbicort x 2 pffs seems to help  Not limited by breathing from desired activities  But very sedentary/ no need for saba   No obvious day to day or daytime variabilty or assoc excess or purulent mucus  or cp or chest tightness, subjective wheeze overt sinus or hb symptoms. No unusual exp hx or h/o childhood pna/ asthma or knowledge of premature birth.  Sleeping ok without nocturnal  or early am exacerbation  of respiratory  c/o's or  need for noct saba. Also denies any obvious fluctuation of symptoms with weather or environmental changes or other aggravating or alleviating factors except as outlined above   Current Medications, Allergies, Complete Past Medical History, Past Surgical History, Family History, and Social History were reviewed in [employer] record.  ROS  The following are not active complaints unless bolded sore throat, dysphagia, dental problems, itching, sneezing,  nasal congestion or excess watery d/c from nose but no  purulent secretions, ear ache,   fever, chills, sweats, unintended wt loss, pleuritic or exertional cp, hemoptysis,  orthopnea pnd or leg swelling, presyncope, palpitations, heartburn, abdominal pain, anorexia, nausea, vomiting, diarrhea  or change in bowel or urinary habits, change in stools or urine, dysuria,hematuria,  rash, arthralgias, visual complaints, headache, numbness weakness or ataxia or problems with walking or coordination,  change in mood/affect or memory.                       Objective:   Physical Exam  11/10/2012       206 vs 12/19/2012 206  Vs 207 01/16/2013 > 04/27/2013  210 > 06/15/2013  208  >211 07/27/2013 > 04/19/14 201 >203 05/10/2014 > 07/05/2014 201     HEENT:  Upper and lower partial plate , turbinates, and orophanx. Nl external ear canals without cough reflex   NECK :  without JVD/Nodes/TM/ nl carotid upstrokes bilaterally   LUNGS: no acc muscle use,  CTA w/ no wheezing   CV:  RRR  no s3 or murmur or increase in P2, trace bilateral sym edema   ABD:  soft and nontender with nl excursion in the supine position. No bruits or organomegaly, bowel sounds nl  MS:  warm without deformities, calf tenderness, cyanosis or clubbing          CXR  04/19/2014 : Stable cardiomegaly with vascular congestion.   12/29/12 Echo : c/w Grade 1 diastolic dysfunction with nl ef      Assessment & Plan:

## 2014-07-06 ENCOUNTER — Encounter: Payer: Self-pay | Admitting: Internal Medicine

## 2014-07-06 NOTE — Assessment & Plan Note (Signed)
-   PFT's 11/10/2012 FEV1 0.85 (52%)ratio 56 and no better p B 2 with DLCO 34 corrects to 60 - doe x 5-10 min stationery bike > add tudorza 11/10/12 > d/c 05/2013 and no change  -med calendar 07/27/2013 > not using 1019/15   I had an extended discussion with the patient reviewing all relevant studies completed to date and  lasting 15 to 20 minutes of a 25 minute visit on the following ongoing concerns: Overall she's much better despite challenges with med reconciliation and I don't rec she change anything to day   However, if she desires to stay in this clinic longterm she'll need to meet [place] half way and use the med calendar/action plan to treat exac and prevent er trips.  To keep things simple, I have asked the patient to first separate medicines that are perceived as maintenance, that is to be taken daily "no matter what", from those medicines that are taken on only on an as-needed basis and I have given the patient examples of both, and then return to see our NP to generate a  detailed  medication calendar which should be followed until the next physician sees the patient and updates it.

## 2014-07-20 DIAGNOSIS — J449 Chronic obstructive pulmonary disease, unspecified: Secondary | ICD-10-CM | POA: Diagnosis not present

## 2014-08-06 DIAGNOSIS — H40013 Open angle with borderline findings, low risk, bilateral: Secondary | ICD-10-CM | POA: Diagnosis not present

## 2014-08-20 DIAGNOSIS — J449 Chronic obstructive pulmonary disease, unspecified: Secondary | ICD-10-CM | POA: Diagnosis not present

## 2014-09-03 ENCOUNTER — Encounter: Payer: Self-pay | Admitting: Internal Medicine

## 2014-09-06 DIAGNOSIS — I10 Essential (primary) hypertension: Secondary | ICD-10-CM | POA: Diagnosis not present

## 2014-09-06 DIAGNOSIS — R7301 Impaired fasting glucose: Secondary | ICD-10-CM | POA: Diagnosis not present

## 2014-09-06 DIAGNOSIS — J4520 Mild intermittent asthma, uncomplicated: Secondary | ICD-10-CM | POA: Diagnosis not present

## 2014-09-06 DIAGNOSIS — E784 Other hyperlipidemia: Secondary | ICD-10-CM | POA: Diagnosis not present

## 2014-09-06 DIAGNOSIS — M10079 Idiopathic gout, unspecified ankle and foot: Secondary | ICD-10-CM | POA: Diagnosis not present

## 2014-09-09 ENCOUNTER — Telehealth: Payer: Self-pay | Admitting: Internal Medicine

## 2014-09-09 DIAGNOSIS — J9611 Chronic respiratory failure with hypoxia: Secondary | ICD-10-CM

## 2014-09-09 NOTE — Telephone Encounter (Signed)
Spoke with the pt  She is requesting order for new POC  Hers is too heavy and [facility] has a new tank called [employer] and she wants to try this  Order sent to [facility]  Nothing further needed per pt

## 2014-09-18 DIAGNOSIS — J449 Chronic obstructive pulmonary disease, unspecified: Secondary | ICD-10-CM | POA: Diagnosis not present

## 2014-10-07 ENCOUNTER — Encounter: Payer: Self-pay | Admitting: Adult Health

## 2014-10-07 ENCOUNTER — Ambulatory Visit (INDEPENDENT_AMBULATORY_CARE_PROVIDER_SITE_OTHER): Payer: Medicare Other | Admitting: Adult Health

## 2014-10-07 VITALS — BP 136/74 | HR 65 | Temp 97.6°F | Ht 63.0 in | Wt 200.2 lb

## 2014-10-07 DIAGNOSIS — J449 Chronic obstructive pulmonary disease, unspecified: Secondary | ICD-10-CM

## 2014-10-07 NOTE — Patient Instructions (Signed)
Continue on current regimen and followup with Dr. Melvyn Novas in 3 months. Follow med calendar closely and bring to each visit.

## 2014-10-07 NOTE — Progress Notes (Signed)
Subjective:    Patient ID: Mallory Chavez, female    DOB: [birth year] MRN: [id number]  Brief patient profile:  53 yowbf quit smoking around 1994 bothered by some seasonal rhinitis spring > fall x around 1990 then 2012 developed breathing problems dx asthma variably responsive to rx referred 10/01/2012 to pulmonary clinic by Dr Jeanie Cooks and proved to have GOLD II COPD 10/2012  HPI 10/01/2012 1st pulmonary /Wert  On ACEi cc abruptly 2012 shortness of breath assoc with cough min productive mostly clear. Sob x across a parking lot. Not responding to multiple asthma meds. rec benicar 40/25 one half daily in place of lisnopril GERD diet   11/10/2012 f/u ov/Wert re cough/ copd Chief Complaint  Patient presents with  . Follow-up    Pt states breathing has improved some since the last visit. She c/o having some swelling in her fett for the past 4 wks.   no need for albuterol at all, some throat clearing needed in ams, still doe x 5-10 min bike rec Add turdorza one puff twice daily after dulera     12/19/2012 f/u ov/Wert re copd Chief Complaint  Patient presents with  . Followup with cxr    Pt states her breathing continues to improve as well as her edema in feet. Her sats when arrived were 80%ra    no longer coughing. Walking 30 min daily  Using dulera 100 2bid and rarely the rescue, has neb doesn't use it.  rec Stop cod liver oil  And fish oil > eat more fish instead especially baked or broiled wild salmon > if arthritis comes back take the cod liver oil with breakfast Stop dulera Start symbicort 160 2 puffs every 12 hours      01/16/2013 f/u ov/Wert re GOLD II copd with ab component  Chief Complaint  Patient presents with  . Follow-up    Pt states breathing continues to improve. She c/o hoarseness and throat clearing for the past 3 days- sometimes able to produce minimal brown sputum.    not as limited from desired activities, min use of saba  rec Add pepcid 20 mg at bedtime as long  as you are coughing in the am For cough take mucinex dm 1200 every 12 hours (robitussin dm) zpak called in  GERD diet   Please schedule a follow up visit in 3 months but call sooner if needed  Late add: low treshold to d/c coreg > bisoprolol or bystolic    [id number] f/u ov/Wert re: GOLD II COPD with AB component Chief Complaint  Patient presents with  . Follow-up    Pt states having increased SOB for the past couple of days- using proair 3 times daily. She also c/o coughing and wheezing for the past 2 days- cough is non prod.   ran out out symbicort x sev days and usual amt of saba increased from bid to qid No cough  On coreg 25 bid  Cough and sob flared about the same time.  Sob with slow adls better p saba Cough min white mucus production rec symbicort 160 Take 2 puffs first thing in am and then another 2 puffs about 12 hours later.  Continue tudorza one twice daily for now Work on inhaler technique:  Only use your albuterol as rescue  Try bystolic 10 mg twice daily in place of coreg until you see Dr Jackson Latino.    06/15/2013 f/u ov/Wert re: GOLD II/  HBP Chief Complaint  Patient presents with  . Follow-up  Pt states that her breathing is much improved. Still using rescue inhaler several times per day.   confused with meds, wants off symbiocrt p reading the PI Better p stopped tudorza >>tapered off symbicort   07/27/2013 Follow up and med review  Patient returns for a one-month followup, and medication review. Reviewed all her medications and organized them into a medication calendar with patient education. It appears the patient is taking her medications correctly. Last visit she tapered off  symbicort last ov without flare of cough or dyspnea.  She has been tried on [place] without perceived benefit.  She uses her pro air on average 2-3 times a week. No emergency room or hospitalizations   rec Continue on current regimen and followup with Dr. Melvyn Novas in 3  months. Follow med calendar closely and bring to each visit> did not return as rec    04/19/2014 acute  ov/Wert re: GOLD II  copd/ no med calendar/ not really clear on how to use meds  Chief Complaint  Patient presents with  . Acute Visit    Pt c/o increased SOB and wheezing x 2 wks.  She is SOB mainly with exertion such as walking from room to room at home. She is using her proair 2-3 times per day.   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  She had an ONO on 10/21 that was positive for most of the night.  We discussed starting on nocturnal O2. She is hesitant but agreed to try it Last ov started on O2 with activity. Says she wears at home but tank is too heavy  To use outside of the home. Order sent for revaluation for light weight system.  Pt did not bring all her meds and is confused about meds she is taking    07/05/2014 f/u ov/Wert re: copd/ brought meds / no calendar  Chief Complaint  Patient presents with  . Follow-up    pt reports breathing better, still has sob, wheezing am and at night, cough with clear mucus. She lots of am mucus from nose  denies wheezing at night, does wake up at usual hour and symbicort x 2 pffs seems to help >>  10/07/2014 Follow up COPD Patient returns for a three-month follow-up and medication review Reviewed all her medications organize them into a medication count with patient education Appears to  be taking correctly Pt states breathing has improved.  Denies any chest pain, orthopnea, PND, or increased leg swelling   Current Medications, Allergies, Complete Past Medical History, Past Surgical  History, Family History, and Social History were reviewed in [employer] record.  ROS  The following are not active complaints unless bolded sore throat, dysphagia, dental problems, itching, sneezing,     purulent secretions, ear ache,   fever, chills, sweats, unintended wt loss, pleuritic or exertional cp, hemoptysis,  orthopnea pnd or leg swelling, presyncope, palpitations, heartburn, abdominal pain, anorexia, nausea, vomiting, diarrhea  or change in bowel or urinary habits, change in stools or urine, dysuria,hematuria,  rash, arthralgias, visual complaints, headache, numbness weakness or ataxia or problems  with walking or coordination,  change in mood/affect or memory.                       Objective:   Physical Exam  11/10/2012       206 vs 12/19/2012 206  Vs 207 01/16/2013 > 04/27/2013  210 > 06/15/2013  208  >211 07/27/2013 > 04/19/14 201 >203 05/10/2014 > 07/05/2014 201 >10/07/2014 200    HEENT:  Upper and lower partial plate , turbinates, and orophanx. Nl external ear canals without cough reflex   NECK :  without JVD/Nodes/TM/ nl carotid upstrokes bilaterally   LUNGS: no acc muscle use,  CTA w/ no wheezing   CV:  RRR  no s3 or murmur or increase in P2, trace bilateral sym edema   ABD:  soft and nontender with nl excursion in the supine position. No bruits or organomegaly, bowel sounds nl  MS:  warm without deformities, calf tenderness, cyanosis or clubbing          CXR  04/19/2014 : Stable cardiomegaly with vascular congestion.   12/29/12 Echo : c/w Grade 1 diastolic dysfunction with nl ef      Assessment & Plan:

## 2014-10-12 NOTE — Assessment & Plan Note (Signed)
Compensated on present regimen Calendar completed  Plan Continue on current regimen and followup with Dr. Melvyn Novas in 3 months. Follow med calendar closely and bring to each visit.

## 2014-10-14 ENCOUNTER — Telehealth: Payer: Self-pay | Admitting: Internal Medicine

## 2014-10-14 NOTE — Assessment & Plan Note (Addendum)
04/19/14 walked one lap dropped to 80% required 4lpm with walking to maintain sats     I had an extended discussion with the patient reviewing all relevant studies completed to date and  lasting 15 to 20 minutes of a 25 minute visit on the following ongoing concerns:  1) needs 02 4lpm with walking more than across a room  and likely also at hs but ok at rest for now  2) needs ono RA to determine noct needs  3) will need to pay much more attention to details of care to prevent further decline in functional status  See instructions for specific recommendations which were reviewed directly with the patient who was given a copy with highlighter outlining the key components.

## 2014-10-14 NOTE — Telephone Encounter (Signed)
Called and spoke to [place] with [employer] services. MW had completed a form regarding pt's transportation needs and Kennyth Lose wanted to be sure MW knew by answering "no" to the following question that it is indicating she is able to ride normal public transportation and denies the need for [employer] transportation.   Question is: "Does the applicant's disability or condition prevent use of regular fixed route bus service?" MW answered "no" as this means pt does not need [employer]. If the answer is "yes" or "sometimes" (indicating pt needs [employer]) then a explanation will need to be provided.   MW please advise.

## 2014-10-14 NOTE — Telephone Encounter (Signed)
Called and made [facility] aware. Nothing further needed

## 2014-10-14 NOTE — Telephone Encounter (Signed)
She is at times sob at rest due to her copd so would not be appropriate for public transportation

## 2014-10-19 DIAGNOSIS — J449 Chronic obstructive pulmonary disease, unspecified: Secondary | ICD-10-CM | POA: Diagnosis not present

## 2014-11-15 DIAGNOSIS — R7301 Impaired fasting glucose: Secondary | ICD-10-CM | POA: Diagnosis not present

## 2014-11-15 DIAGNOSIS — M10079 Idiopathic gout, unspecified ankle and foot: Secondary | ICD-10-CM | POA: Diagnosis not present

## 2014-11-15 DIAGNOSIS — I10 Essential (primary) hypertension: Secondary | ICD-10-CM | POA: Diagnosis not present

## 2014-11-15 DIAGNOSIS — E784 Other hyperlipidemia: Secondary | ICD-10-CM | POA: Diagnosis not present

## 2014-11-15 DIAGNOSIS — J4520 Mild intermittent asthma, uncomplicated: Secondary | ICD-10-CM | POA: Diagnosis not present

## 2014-11-18 DIAGNOSIS — J449 Chronic obstructive pulmonary disease, unspecified: Secondary | ICD-10-CM | POA: Diagnosis not present

## 2014-12-06 DIAGNOSIS — E559 Vitamin D deficiency, unspecified: Secondary | ICD-10-CM | POA: Diagnosis not present

## 2014-12-06 DIAGNOSIS — N183 Chronic kidney disease, stage 3 (moderate): Secondary | ICD-10-CM | POA: Diagnosis not present

## 2014-12-06 DIAGNOSIS — I10 Essential (primary) hypertension: Secondary | ICD-10-CM | POA: Diagnosis not present

## 2014-12-19 DIAGNOSIS — J449 Chronic obstructive pulmonary disease, unspecified: Secondary | ICD-10-CM | POA: Diagnosis not present

## 2014-12-27 DIAGNOSIS — E784 Other hyperlipidemia: Secondary | ICD-10-CM | POA: Diagnosis not present

## 2014-12-27 DIAGNOSIS — J4520 Mild intermittent asthma, uncomplicated: Secondary | ICD-10-CM | POA: Diagnosis not present

## 2014-12-27 DIAGNOSIS — R7301 Impaired fasting glucose: Secondary | ICD-10-CM | POA: Diagnosis not present

## 2014-12-27 DIAGNOSIS — N183 Chronic kidney disease, stage 3 (moderate): Secondary | ICD-10-CM | POA: Diagnosis not present

## 2014-12-27 DIAGNOSIS — I10 Essential (primary) hypertension: Secondary | ICD-10-CM | POA: Diagnosis not present

## 2015-01-10 ENCOUNTER — Ambulatory Visit (INDEPENDENT_AMBULATORY_CARE_PROVIDER_SITE_OTHER): Payer: Medicare Other | Admitting: Internal Medicine

## 2015-01-10 ENCOUNTER — Encounter: Payer: Self-pay | Admitting: Internal Medicine

## 2015-01-10 VITALS — BP 144/92 | HR 91 | Ht 65.0 in | Wt 195.0 lb

## 2015-01-10 DIAGNOSIS — J9611 Chronic respiratory failure with hypoxia: Secondary | ICD-10-CM

## 2015-01-10 DIAGNOSIS — J449 Chronic obstructive pulmonary disease, unspecified: Secondary | ICD-10-CM

## 2015-01-10 DIAGNOSIS — E669 Obesity, unspecified: Secondary | ICD-10-CM | POA: Diagnosis not present

## 2015-01-10 DIAGNOSIS — I10 Essential (primary) hypertension: Secondary | ICD-10-CM

## 2015-01-10 NOTE — Progress Notes (Signed)
Subjective:    Patient ID: Mallory Chavez, female    DOB: 06-18-1936 MRN: [id number]  Brief patient profile:  55 yowbf quit smoking around 1994 bothered by some seasonal rhinitis spring > fall x around 1990 then 2012 developed breathing problems dx asthma variably responsive to rx referred 10/01/2012 to pulmonary clinic by Dr Jeanie Cooks and proved to have GOLD II COPD 10/2012    HPI 10/01/2012 1st pulmonary /Wert  On ACEi cc abruptly 2012 shortness of breath assoc with cough min productive mostly clear. Sob x across a parking lot. Not responding to multiple asthma meds. rec benicar 40/25 one half daily in place of lisnopril GERD diet     07/27/2013 Follow up and med review  Patient returns for a one-month followup, and medication review. Reviewed all her medications and organized them into a medication calendar with patient education. It appears the patient is taking her medications correctly. Last visit she tapered off  symbicort last ov without flare of cough or dyspnea.  She has been tried on [place] without perceived benefit.  She uses her pro air on average 2-3 times a week. No emergency room or hospitalizations   rec Continue on current regimen and followup with Dr. Melvyn Novas in 3 months. Follow med calendar closely and bring to each visit> did not return as rec    04/19/2014 acute  ov/Wert re: GOLD II  copd/ no med calendar/ not really clear on how to use meds  Chief Complaint  Patient presents with  . Acute Visit    Pt c/o increased SOB and wheezing x 2 wks.  She is SOB mainly with exertion such as walking from room to room at home. She is using her proair 2-3 times per day.   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 Follow-up    COPD; cough in mornings; SOB w/activity   mucus is min/white/ cough only in am's x 5-10 m not using 02 at rest/ says using 2lpm with activity like [employer] can do slow pace / does use [employer] parking  - more limited by hip pain now  No obvious day to day or daytime variability or assoc  cp or chest tightness, subjective wheeze or overt sinus or hb symptoms. No unusual exp hx or h/o childhood pna/ asthma or knowledge of premature birth.  Sleeping ok without nocturnal  or early am exacerbation  of respiratory  c/o's or need for noct saba. Also denies any obvious fluctuation of symptoms with weather or environmental changes or other aggravating or alleviating factors except as outlined above   Current Medications, Allergies, Complete Past Medical History, Past Surgical History, Family History, and Social History were reviewed in [employer] record.  ROS  The following are not active complaints unless bolded sore throat, dysphagia, dental problems, itching, sneezing,  nasal congestion or excess/ purulent secretions, ear ache,   fever, chills, sweats, unintended wt loss, classically pleuritic or exertional cp, hemoptysis,  orthopnea pnd or leg swelling, presyncope, palpitations, abdominal pain, anorexia, nausea, vomiting, diarrhea  or change in  bowel or bladder habits, change in stools or urine, dysuria,hematuria,  rash, arthralgias, visual complaints, headache, numbness, weakness or ataxia or problems with walking or coordination,  change in mood/affect or memory.           Objective:   Physical Exam  11/10/2012       206 vs 12/19/2012 206  Vs 207 01/16/2013 > 04/27/2013  210 > 06/15/2013  208  >211 07/27/2013 > 04/19/14 201 >203 05/10/2014 > 07/05/2014 201 >10/07/2014 200>  01/10/2015   195   Vital signs reviewed/ note min elevated hbp     HEENT:   Upper and lower partial plate , turbinates, and orophanx. Nl external ear canals without cough reflex   NECK :  without JVD/Nodes/TM/ nl carotid upstrokes bilaterally   LUNGS: no acc muscle use,  CTA w/ no wheezing   CV:  RRR  no s3 or murmur or increase in P2, trace bilateral sym lower ext edema   ABD:  soft and nontender with nl excursion in the supine position. No bruits or organomegaly, bowel sounds nl  MS:  warm without deformities, calf tenderness, cyanosis or clubbing          CXR  04/19/2014 : Stable cardiomegaly with vascular congestion.   12/29/12 Echo : c/w Grade 1 diastolic dysfunction with nl ef      Assessment & Plan:   Outpatient Encounter Prescriptions as of 01/10/2015  Medication Sig  . aspirin 81 MG tablet Take 81 mg by mouth at bedtime.   Marland Kitchen atorvastatin (LIPITOR) 40 MG tablet Take 40 mg by mouth daily.  . budesonide-formoterol (SYMBICORT) 160-4.5 MCG/ACT inhaler inhale 2 puffs by mouth twice daily  . COLCRYS 0.6 MG tablet Take 1 tablet by mouth daily as needed.  . diclofenac sodium (VOLTAREN) 1 % GEL Apply 4 g topically 4 (four) times daily.  Marland Kitchen diltiazem (CARDIZEM CD) 240 MG 24 hr capsule Take 240 mg by mouth daily.  . fluticasone (FLONASE) 50 MCG/ACT nasal spray Place 2 sprays into both nostrils daily as needed.   . furosemide (LASIX) 80 MG tablet Take 80 mg by mouth daily.  Marland Kitchen loratadine (CLARITIN) 10 MG tablet Take 10 mg by mouth daily as needed for allergies.  Marland Kitchen losartan-hydrochlorothiazide (HYZAAR) 100-25 MG per tablet Take 1 tablet by mouth daily.  Marland Kitchen omeprazole (PRILOSEC) 20 MG capsule Take 30-60 min before first meal of the day  . OVER THE COUNTER MEDICATION Tumeric Curcumin 500mg  daily at bedtime  . potassium chloride (K-DUR,KLOR-CON) 10 MEQ tablet Take 1 tablet by mouth at bedtime.   Marland Kitchen PROAIR HFA 108 (90 BASE) MCG/ACT inhaler Inhale 2 puffs into the lungs every 4 (four) hours as needed for wheezing or shortness of breath.   . [DISCONTINUED]  cetirizine (ZYRTEC) 10 MG tablet Take 10 mg by mouth at bedtime.  . [DISCONTINUED] famotidine (PEPCID) 20 MG tablet Take 20 mg by mouth at bedtime.  . [DISCONTINUED] metoprolol (LOPRESSOR) 50 MG tablet Take 50 mg by mouth daily.  . [DISCONTINUED] Multiple Vitamin (MULTIVITAMIN) tablet Take 1 tablet by mouth daily.   No facility-administered encounter medications on file as of 01/10/2015.

## 2015-01-10 NOTE — Patient Instructions (Signed)
No need for 02 at rest/ continue your portable concentrator as is with activity  Please schedule a follow up visit in 34months with [name] but call sooner if needed  - return with all medications and your medication calendar which you should show to every doctor you see

## 2015-01-17 ENCOUNTER — Encounter: Payer: Self-pay | Admitting: Internal Medicine

## 2015-01-17 NOTE — Assessment & Plan Note (Signed)
Change acei to arb effective 10/01/12 for pseudoasthma > resolved   - Echo 12/29/2012  > Grade I diastolic dysfunction   Advised pt needs primary care f/u with med calendar and all meds in hand

## 2015-01-17 NOTE — Assessment & Plan Note (Signed)
-   PFT's 11/10/2012 FEV1 0.85 (52%)ratio 56 and no better p B 2 with DLCO 34 corrects to 60 - doe x 5-10 min stationery bike > add tudorza 11/10/12 > d/c 05/2013 and no change  -med calendar 07/27/2013 > not using 1019/15 , 10/12/2014 , 01/10/2015   She is relatively well compensated but refuses to use med calendar that also has action plan at bottom for flares   I had an extended discussion with the patient reviewing all relevant studies completed to date and  lasting 15 to 20 minutes of a 25 minute visit    Each maintenance medication was reviewed in detail including most importantly the difference between maintenance and prns and under what circumstances the prns are to be triggered using an action plan format that is not reflected in the computer generated alphabetically organized AVS.    Please see instructions for details which were reviewed in writing and the patient given a copy highlighting the part that I personally wrote and discussed at today's ov.

## 2015-01-17 NOTE — Assessment & Plan Note (Signed)
Body mass index is 32.45 kg/(m^2).  Lab Results  Component Value Date   TSH 1.04 12/19/2012     Contributing to gerd tendency/ doe/ needs to achieve and maintain neg calorie balance >f/u primary care   ? Recent TSH

## 2015-01-17 NOTE — Assessment & Plan Note (Signed)
04/19/14 walked one lap dropped to 80% required 4lpm with walking to maintain sats  ono RA 04/21/14 desat x 7 h x 12 m at < 89%  04/26/2014 rec 2lpm and repeat ono on 2lpm  - 01/10/2015  Walked 2lpmx 2 laps @ 185 ft each stopped due to hip pain, slow pace/ no sob or desat   As of 01/10/15 rec is for no 02 at rest/ 2lpm at hs and with activity

## 2015-01-18 DIAGNOSIS — J449 Chronic obstructive pulmonary disease, unspecified: Secondary | ICD-10-CM | POA: Diagnosis not present

## 2015-02-18 DIAGNOSIS — J449 Chronic obstructive pulmonary disease, unspecified: Secondary | ICD-10-CM | POA: Diagnosis not present

## 2015-03-21 DIAGNOSIS — J449 Chronic obstructive pulmonary disease, unspecified: Secondary | ICD-10-CM | POA: Diagnosis not present

## 2015-03-28 DIAGNOSIS — I10 Essential (primary) hypertension: Secondary | ICD-10-CM | POA: Diagnosis not present

## 2015-03-28 DIAGNOSIS — Z1389 Encounter for screening for other disorder: Secondary | ICD-10-CM | POA: Diagnosis not present

## 2015-03-28 DIAGNOSIS — M179 Osteoarthritis of knee, unspecified: Secondary | ICD-10-CM | POA: Diagnosis not present

## 2015-03-28 DIAGNOSIS — R7301 Impaired fasting glucose: Secondary | ICD-10-CM | POA: Diagnosis not present

## 2015-03-28 DIAGNOSIS — J4520 Mild intermittent asthma, uncomplicated: Secondary | ICD-10-CM | POA: Diagnosis not present

## 2015-03-28 DIAGNOSIS — M10079 Idiopathic gout, unspecified ankle and foot: Secondary | ICD-10-CM | POA: Diagnosis not present

## 2015-04-20 DIAGNOSIS — J449 Chronic obstructive pulmonary disease, unspecified: Secondary | ICD-10-CM | POA: Diagnosis not present

## 2015-05-17 DIAGNOSIS — H25013 Cortical age-related cataract, bilateral: Secondary | ICD-10-CM | POA: Diagnosis not present

## 2015-05-17 DIAGNOSIS — H2513 Age-related nuclear cataract, bilateral: Secondary | ICD-10-CM | POA: Diagnosis not present

## 2015-05-17 DIAGNOSIS — H35031 Hypertensive retinopathy, right eye: Secondary | ICD-10-CM | POA: Diagnosis not present

## 2015-05-17 DIAGNOSIS — H35361 Drusen (degenerative) of macula, right eye: Secondary | ICD-10-CM | POA: Diagnosis not present

## 2015-05-17 DIAGNOSIS — H40013 Open angle with borderline findings, low risk, bilateral: Secondary | ICD-10-CM | POA: Diagnosis not present

## 2015-05-21 DIAGNOSIS — J449 Chronic obstructive pulmonary disease, unspecified: Secondary | ICD-10-CM | POA: Diagnosis not present

## 2015-07-15 ENCOUNTER — Encounter: Payer: Self-pay | Admitting: Adult Health

## 2015-07-15 ENCOUNTER — Ambulatory Visit (INDEPENDENT_AMBULATORY_CARE_PROVIDER_SITE_OTHER): Payer: Commercial Managed Care - HMO | Admitting: Adult Health

## 2015-07-15 VITALS — BP 126/70 | HR 87 | Temp 98.9°F | Ht 64.0 in | Wt 192.0 lb

## 2015-07-15 DIAGNOSIS — J449 Chronic obstructive pulmonary disease, unspecified: Secondary | ICD-10-CM

## 2015-07-15 DIAGNOSIS — J9611 Chronic respiratory failure with hypoxia: Secondary | ICD-10-CM

## 2015-07-15 NOTE — Patient Instructions (Signed)
Continue on current regimen  Follow up with Dr. Melvyn Novas  In 4-6 months and As needed

## 2015-07-24 NOTE — Assessment & Plan Note (Signed)
Stable without flare   Plan  Continue on current regimen  Follow up with Dr. Melvyn Novas  In 4-6 months and As needed

## 2015-07-24 NOTE — Progress Notes (Signed)
Subjective:    Patient ID: Mallory Chavez, female    DOB: 01-15-1936 MRN: [id number]  Brief patient profile:  63 yowbf quit smoking around 1994 bothered by some seasonal rhinitis spring > fall x around 1990 then 2012 developed breathing problems dx asthma variably responsive to rx referred 10/01/2012 to pulmonary clinic by Dr Jeanie Cooks and proved to have GOLD II COPD 10/2012    HPI 10/01/2012 1st pulmonary /Wert  On ACEi cc abruptly 2012 shortness of breath assoc with cough min productive mostly clear. Sob x across a parking lot. Not responding to multiple asthma meds. rec benicar 40/25 one half daily in place of lisnopril GERD diet     07/27/2013 Follow up and med review  Patient returns for a one-month followup, and medication review. Reviewed all her medications and organized them into a medication calendar with patient education. It appears the patient is taking her medications correctly. Last visit she tapered off  symbicort last ov without flare of cough or dyspnea.  She has been tried on [place] without perceived benefit.  She uses her pro air on average 2-3 times a week. No emergency room or hospitalizations   rec Continue on current regimen and followup with Dr. Melvyn Novas in 3 months. Follow med calendar closely and bring to each visit> did not return as rec    04/19/2014 acute  ov/Wert re: GOLD II  copd/ no med calendar/ not really clear on how to use meds  Chief Complaint  Patient presents with  . Acute Visit    Pt c/o increased SOB and wheezing x 2 wks.  She is SOB mainly with exertion such as walking from room to room at home. She is using her proair 2-3 times per day.   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 Follow-up    COPD; cough in mornings; SOB w/activity   mucus is min/white/ cough only in am's x 5-10 m not using 02 at rest/ says using 2lpm with activity like [employer] can do slow pace / does use [employer] parking  - more limited by hip pain now >>O2 with act and At bedtime    07/15/15 Follow up: COPD /O2  Pt returns for 6 month follow up .  Says overall she is doing okay.  No flare in cough or wheezing.  Remains on Symbicort Twice daily  .  Remains on O2 with act and At bedtime  . She has been tried on [place] without perceived benefit.  Denies chest pain, orthopnea, edema or fever.     Current Medications, Allergies, Complete Past Medical History, Past Surgical History, Family History, and Social History were reviewed in [employer] record.  ROS  The following are not active complaints unless bolded sore throat, dysphagia, dental problems, itching, sneezing,  nasal congestion or excess/ purulent secretions, ear ache,   fever, chills, sweats, unintended wt loss, classically pleuritic or exertional cp, hemoptysis,  orthopnea pnd or leg swelling, presyncope, palpitations, abdominal pain, anorexia, nausea, vomiting, diarrhea  or change in bowel or bladder  habits, change in stools or urine, dysuria,hematuria,  rash, arthralgias, visual complaints, headache, numbness, weakness or ataxia or problems with walking or coordination,  change in mood/affect or memory.           Objective:   Physical Exam  11/10/2012       206 vs 12/19/2012 206  Vs 207 01/16/2013 > 04/27/2013  210 > 06/15/2013  208  >211 07/27/2013 > 04/19/14 201 >203 05/10/2014 > 07/05/2014 201 >10/07/2014 200>  01/10/2015   195 >192 07/15/15   Filed Vitals:   07/15/15 1152  BP: 126/70  Pulse: 87  Temp: 98.9 F (37.2 C)  TempSrc: Oral  Height: 5\' 4"  (1.626 m)  Weight: 192 lb (87.091  kg)  SpO2: 98%       HEENT:  Upper and lower partial plate , turbinates, and orophanx. Nl external ear canals without cough reflex   NECK :  without JVD/Nodes/TM/ nl carotid upstrokes bilaterally   LUNGS: no acc muscle use,  CTA w/ no wheezing   CV:  RRR  no s3 or murmur or increase in P2, trace bilateral sym lower ext edema   ABD:  soft and nontender with nl excursion in the supine position. No bruits or organomegaly, bowel sounds nl  MS:  warm without deformities, calf tenderness, cyanosis or clubbing          CXR  04/19/2014 : Stable cardiomegaly with vascular congestion.   12/29/12 Echo : c/w Grade 1 diastolic dysfunction with nl ef

## 2015-07-24 NOTE — Assessment & Plan Note (Signed)
Stable , cont w/ O2 with act and At bedtime

## 2015-08-24 ENCOUNTER — Telehealth: Payer: Self-pay | Admitting: Internal Medicine

## 2015-08-24 NOTE — Telephone Encounter (Signed)
Mallory Chavez dropped off letter pt received from [facility]. It states that pt is needing OV with qualifying sats to re certify her for her O2 with an OV prior to 09/14/15. Pt has changed insurance to [employer] reason this is requiring this. Called spoke with pt. Appt scheduled for 09/13/15 at 11:15. Paperwork placed at from for appt date. Nothing further needed

## 2015-08-24 NOTE — Telephone Encounter (Signed)
I have a man in the [employer] named Wes to Help with this

## 2015-08-24 NOTE — Telephone Encounter (Signed)
Called and spoke to pt. Pt states she received a letter from her insurance regarding [employer]. Pt states Wes with [employer] was with her at home going over the letter she received. I spoke with Wes and he states the pt may need to change DME's from [facility] to [place] because of coverage, Wes stated he would look further into the issue then get back to [place] with what exactly is needed. Will await return call.

## 2015-09-13 ENCOUNTER — Encounter: Payer: Self-pay | Admitting: Internal Medicine

## 2015-09-13 ENCOUNTER — Ambulatory Visit (INDEPENDENT_AMBULATORY_CARE_PROVIDER_SITE_OTHER): Payer: Medicare HMO | Admitting: Internal Medicine

## 2015-09-13 VITALS — BP 132/76 | HR 97 | Ht 64.0 in | Wt 193.0 lb

## 2015-09-13 DIAGNOSIS — J449 Chronic obstructive pulmonary disease, unspecified: Secondary | ICD-10-CM

## 2015-09-13 DIAGNOSIS — J9611 Chronic respiratory failure with hypoxia: Secondary | ICD-10-CM | POA: Diagnosis not present

## 2015-09-13 NOTE — Patient Instructions (Addendum)
No change recommendations   No need for 02 at rest but 2lpm at bedtime and with activity   Please schedule a follow up visit in 6  months but call sooner if needed to see [name]

## 2015-09-13 NOTE — Progress Notes (Signed)
Subjective:    Patient ID: Mallory Chavez, female    DOB: 02-11-1936 MRN: [id number]  Brief patient profile:  66 yowbf quit smoking around 1994 bothered by some seasonal rhinitis spring > fall x around 1990 then 2012 developed breathing problems dx asthma variably responsive to rx referred 10/01/2012 to pulmonary clinic by Dr Jeanie Cooks and proved to have GOLD II COPD 10/2012    History of Present Illness  10/01/2012 1st pulmonary /Hardie Veltre  On ACEi cc abruptly 2012 shortness of breath assoc with cough min productive mostly clear. Sob x across a parking lot. Not responding to multiple asthma meds. rec benicar 40/25 one half daily in place of lisnopril GERD diet    04/19/2014 acute  ov/Deitrich Steve re: GOLD II  copd/ no med calendar/ not really clear on how to use meds  Chief Complaint  Patient presents with  . Acute Visit    Pt c/o increased SOB and wheezing x 2 wks.  She is SOB mainly with exertion such as walking from room to room at home. She is using her proair 2-3 times per day.   baseline doe/fatigue HC parking/ [name] or [employer]  Now sob across room  Nose runs some, clariton helps  Only using symbicort once a day  Onset of worsening was indolent/progressive, some better p saba  >>Increase Symbicort 160 , and O2 at 4l/m with act , ONO ordered     01/10/2015 f/u ov/Anginette Espejo re: COPD/ no med calendar / on symbicort 160 2bid rare need for saba  Chief Complaint  Patient presents with  . Follow-up    COPD; cough in mornings; SOB w/activity  Mucus is min/white/ cough only in am's x 5-10 m not using 02 at rest/ says using 2lpm with activity like [employer] can do slow pace / does use [employer]c parking  - more limited by hip pain now rec No need for 02 at rest/ continue your portable concentrator as is with activity Please schedule a follow up visit in 53months with [name] but call sooner if needed  - return with all medications and your medication calendar which you should show to every doctor you  see  07/15/15 NP eval/ no change rx/ rec use med calendar and return with it for each ov   09/13/2015  f/u ov/Earnest Mcgillis re: COPD GOLD II/ obesity/ symbicort 160 2bid / rare saba / 02 2lpm hs and with activity /no med calendar  Chief Complaint  Patient presents with  . Follow-up    O2 qualification today. Pt denies any increased breathing issues - SOB with usual exertion.   doe = chronic = MMRC1 = can walk nl pace, flat grade, can't hurry or go uphills or steps s sob  On 02  No obvious day to day or daytime variability or assoc  cp or chest tightness, subjective wheeze or overt sinus or hb symptoms. No unusual exp hx or h/o childhood pna/ asthma or knowledge of premature birth.  Sleeping ok without nocturnal  or early am exacerbation  of respiratory  c/o's or need for noct saba. Also denies any obvious fluctuation of symptoms with weather or environmental changes or other aggravating or alleviating factors except as outlined above   Current Medications, Allergies, Complete Past Medical History, Past Surgical History, Family History, and Social History were reviewed in [employer] record.  ROS  The following are not active complaints unless bolded sore throat, dysphagia, dental problems, itching, sneezing,  nasal congestion or excess/ purulent  secretions, ear ache,   fever, chills, sweats, unintended wt loss, classically pleuritic or exertional cp, hemoptysis,  orthopnea pnd or leg swelling, presyncope, palpitations, abdominal pain, anorexia, nausea, vomiting, diarrhea  or change in bowel or bladder habits, change in stools or urine, dysuria,hematuria,  rash, arthralgias, visual complaints, headache, numbness, weakness or ataxia or problems with walking or coordination,  change in mood/affect or memory.           Objective:   Physical Exam  11/10/2012       206 vs 12/19/2012 206  Vs 207 01/16/2013 > 04/27/2013  210 > 06/15/2013  208  >211 07/27/2013 > 04/19/14 201 >203 05/10/2014 >  07/05/2014 201 >10/07/2014 200>  01/10/2015   195 > 09/13/2015  193   Pleasant obese amb bf nad Vital signs reviewed     HEENT:  Upper and lower partial plate , turbinates, and orophanx. Nl external ear canals without cough reflex   NECK :  without JVD/Nodes/TM/ nl carotid upstrokes bilaterally   LUNGS: no acc muscle use,  CTA w/ no wheezing   CV:  RRR  no s3 or murmur or increase in P2,  No longer lower ext edema either side   ABD:  soft and nontender with nl excursion in the supine position. No bruits or organomegaly, bowel sounds nl  MS:  warm without deformities, calf tenderness, cyanosis or clubbing              Assessment & Plan:

## 2015-09-14 ENCOUNTER — Encounter: Payer: Self-pay | Admitting: Internal Medicine

## 2015-09-14 NOTE — Assessment & Plan Note (Signed)
04/19/14 walked one lap dropped to 80% required 4lpm with walking to maintain sats  ono RA 04/21/14 desat x 7 h x 12 m at < 89%  04/26/2014 rec 2lpm and repeat ono on 2lpm  - 01/10/2015  Walked 2lpmx 2 laps @ 185 ft each stopped due to hip pain, slow pace/ no sob or desat  -Patient Saturations on Room Air at Rest = 95% Patient Saturations on [employer] while Ambulating = 87% Patient Saturations on 2 Liters of oxygen while Ambulating = 97%  As of 09/13/2015  rec is for no 02 at rest/ 2lpm at hs and with activity

## 2015-09-14 NOTE — Assessment & Plan Note (Addendum)
-   PFT's 11/10/2012 FEV1 0.85 (52%)ratio 56 and no better p B 2 with DLCO 34 corrects to 60 - doe x 5-10 min stationery bike > add tudorza 11/10/12 > d/c 05/2013 and no change  -med calendar 07/27/2013 > not using 1019/15 , 10/12/2014 , 01/10/2015 and 09/13/2015   Disappointed she's not using the med calendar since it includes a very important action plan in the event of any flare of her recurrent symptoms. On the other hand, she is doing very well for now and I did not make any changes in her recommendations.  I had an extended discussion with the patient reviewing all relevant studies completed to date and  lasting 15 to 20 minutes of a 25 minute visit    Each maintenance medication was reviewed in detail including most importantly the difference between maintenance and prns and under what circumstances the prns are to be triggered using an action plan format that is not reflected in the computer generated alphabetically organized AVS.    Please see instructions for details which were reviewed in writing and the patient given a copy highlighting the part that I personally wrote and discussed at today's ov. Marland Kitchen

## 2016-01-12 ENCOUNTER — Ambulatory Visit: Payer: Commercial Managed Care - HMO | Admitting: Internal Medicine

## 2016-03-15 ENCOUNTER — Ambulatory Visit (INDEPENDENT_AMBULATORY_CARE_PROVIDER_SITE_OTHER): Payer: Medicare Other | Admitting: Internal Medicine

## 2016-03-15 ENCOUNTER — Encounter: Payer: Self-pay | Admitting: Internal Medicine

## 2016-03-15 VITALS — BP 146/92 | HR 95 | Ht 64.0 in | Wt 192.4 lb

## 2016-03-15 DIAGNOSIS — J9611 Chronic respiratory failure with hypoxia: Secondary | ICD-10-CM

## 2016-03-15 DIAGNOSIS — J449 Chronic obstructive pulmonary disease, unspecified: Secondary | ICD-10-CM | POA: Diagnosis not present

## 2016-03-15 NOTE — Patient Instructions (Addendum)
I will ask that [facility] humidify your oxygen   No change in your medications but take all your blood pressure meds in am and follow up with your primary care doctor   Stay as active as you can be   Please schedule a follow up visit in 6  months but call sooner if needed

## 2016-03-15 NOTE — Progress Notes (Signed)
Subjective:    Patient ID: Mallory Chavez, female    DOB: Apr 09, 1936 MRN: [id number]  Brief patient profile:  40 yowbf quit smoking around 1994 bothered by some seasonal rhinitis spring > fall x around 1990 then 2012 developed breathing problems dx asthma variably responsive to rx referred 10/01/2012 to pulmonary clinic by Dr Jeanie Cooks and proved to have GOLD II COPD 10/2012    History of Present Illness  10/01/2012 1st pulmonary /Mallory Chavez  On ACEi cc abruptly 2012 shortness of breath assoc with cough min productive mostly clear. Sob x across a parking lot. Not responding to multiple asthma meds. rec benicar 40/25 one half daily in place of lisnopril GERD diet    04/19/2014 acute  ov/Mallory Chavez re: GOLD II  copd/ no med calendar/ not really clear on how to use meds  Chief Complaint  Patient presents with  . Acute Visit    Pt c/o increased SOB and wheezing x 2 wks.  She is SOB mainly with exertion such as walking from room to room at home. She is using her proair 2-3 times per day.   baseline doe/fatigue HC parking/ [name]  Now sob across room  Nose runs some, clariton helps  Only using symbicort once a day  Onset of worsening was indolent/progressive, some better p saba  >>Increase Symbicort 160 , and O2 at 4l/m with act , ONO ordered     01/10/2015 f/u ov/Mallory Chavez re: COPD/ no med calendar / on symbicort 160 2bid rare need for saba  Chief Complaint  Patient presents with  . Follow-up    COPD; cough in mornings; SOB w/activity  Mucus is min/white/ cough only in am's x 5-10 m not using 02 at rest/ says using 2lpm with activity like [employer] can do slow pace / does use [employer] parking  - more limited by hip pain now rec No need for 02 at rest/ continue your portable concentrator as is with activity Please schedule a follow up visit in 49months with Mallory Chavez but call sooner if needed  - return with all medications and your medication calendar which you should show to every doctor you  see  07/15/15 Chavez eval/ no change rx/ rec use med calendar and return with it for each ov   09/13/2015  f/u ov/Mallory Chavez re: COPD GOLD II/ obesity/ symbicort 160 2bid / rare saba / 02 2lpm hs and with activity /no med calendar  Chief Complaint  Patient presents with  . Follow-up    O2 qualification today. Pt denies any increased breathing issues - SOB with usual exertion.   doe = chronic = MMRC1 = can walk nl pace, flat grade, can't hurry or go uphills or steps s sob  On 02  rec No need for 02 at rest but 2lpm at bedtime and with activity   Please schedule a follow up visit in 6  months but call sooner if needed to see Mallory Chavez     03/15/2016  f/u ov/Mallory Chavez re: COPD GOLD II/ symb 160 maybe one saba daily /never noct / not using med calendar  Chief Complaint  Patient presents with  . Follow-up    breathing, no coughing, some sob, no wheezing, no chest pain or chest tightness  sleeping ok on 02 2lpm  And prn during the day Doe continues = MMRC1 = can walk nl pace, flat grade, can't hurry or go uphills or steps s sob  On 02 2lpm with walking  No obvious day  to day or daytime variability or assoc excess/ purulent sputum or mucus plugs  cp or chest tightness, subjective wheeze or overt sinus or hb symptoms. No unusual exp hx or h/o childhood pna/ asthma or knowledge of premature birth.  Sleeping ok without nocturnal  or early am exacerbation  of respiratory  c/o's or need for noct saba. Also denies any obvious fluctuation of symptoms with weather or environmental changes or other aggravating or alleviating factors except as outlined above   Current Medications, Allergies, Complete Past Medical History, Past Surgical History, Family History, and Social History were reviewed in [employer] record.  ROS  The following are not active complaints unless bolded sore throat, dysphagia, dental problems, itching, sneezing,  nasal congestion or excess/ purulent secretions, ear ache,    fever, chills, sweats, unintended wt loss, classically pleuritic or exertional cp, hemoptysis,  orthopnea pnd or leg swelling, presyncope, palpitations, abdominal pain, anorexia, nausea, vomiting, diarrhea  or change in bowel or bladder habits, change in stools or urine, dysuria,hematuria,  rash, arthralgias, visual complaints, headache, numbness, weakness or ataxia or problems with walking or coordination,  change in mood/affect or memory.           Objective:   Physical Exam  11/10/2012       206 vs 12/19/2012 206  Vs 207 01/16/2013 > 04/27/2013  210 > 06/15/2013  208  >211 07/27/2013 > 04/19/14 201 >203 05/10/2014 > 07/05/2014 201 >10/07/2014 200>  01/10/2015   195 > 09/13/2015  193 > 03/15/2016  192    Pleasant obese amb bf nad  Vital signs reviewed  - note sats 97% 2lpm at arrival / note elevated bp  But did not take both bp meds this am     HEENT:  Upper and lower partial plate , turbinates, and orophanx. Nl external ear canals without cough reflex   NECK :  without JVD/Nodes/TM/ nl carotid upstrokes bilaterally   LUNGS: no acc muscle use,  CTA w/ no wheezing   CV:  RRR  no s3 or murmur or increase in P2,     ABD:  soft and nontender with nl excursion in the supine position. No bruits or organomegaly, bowel sounds nl  MS:  warm without deformities, calf tenderness, cyanosis or clubbing              Assessment & Plan:

## 2016-03-16 NOTE — Assessment & Plan Note (Addendum)
-   PFT's 11/10/2012 FEV1 0.85 (52%)ratio 56 and no better p B 2 with DLCO 34 corrects to 60 - doe x 5-10 min stationery bike > add tudorza 11/10/12 > d/c 05/2013 and no change  -med calendar 07/27/2013 > not using 1019/15 , 10/12/2014 , 01/10/2015 and 09/13/2015    - The proper method of use, as well as anticipated side effects, of a metered-dose inhaler are discussed and demonstrated to the patient. Improved effectiveness after extensive coaching during this visit to a level of approximately 90 % from a baseline of 75 %    Despite mod severe dz remains well compensated on present simplified rx with minimal need for saba/ no flares but continue to be concerned she has poor insight into meds and no action plan (was provided multiple times but can't keep up with med calendar)  I had an extended discussion with the patient reviewing all relevant studies completed to date and  lasting 15 to 20 minutes of a 25 minute visit    Each maintenance medication was reviewed in detail including most importantly the difference between maintenance and prns and under what circumstances the prns are to be triggered using an action plan format that is not reflected in the computer generated alphabetically organized AVS.    Please see instructions for details which were reviewed in writing and the patient given a copy highlighting the part that I personally wrote and discussed at today's ov.

## 2016-03-16 NOTE — Assessment & Plan Note (Signed)
04/19/14 walked one lap dropped to 80% required 4lpm with walking to maintain sats  ono RA 04/21/14 desat x 7 h x 12 m at < 89%  04/26/2014 rec 2lpm and repeat ono on 2lpm  - 01/10/2015  Walked 2lpmx 2 laps @ 185 ft each stopped due to hip pain, slow pace/ no sob or desat  -Patient Saturations on Room Air at Rest = 95% Patient Saturations on [employer] while Ambulating = 87% Patient Saturations on 2 Liters of oxygen while Ambulating = 97%  As of 03/15/2016  rec is for no 02 at rest/ 2lpm at hs and with activity

## 2016-03-16 NOTE — Assessment & Plan Note (Signed)
Body mass index is 33.03 kg/m.  Lab Results  Component Value Date   TSH 1.04 12/19/2012     Contributing to gerd tendency/ doe/reviewed the need and the process to achieve and maintain neg calorie balance > defer f/u primary care including intermittently monitoring thyroid status

## 2016-06-15 ENCOUNTER — Encounter (HOSPITAL_COMMUNITY): Payer: Self-pay

## 2016-06-15 ENCOUNTER — Telehealth: Payer: Self-pay | Admitting: Internal Medicine

## 2016-06-15 ENCOUNTER — Inpatient Hospital Stay (HOSPITAL_COMMUNITY)
Admission: EM | Admit: 2016-06-15 | Discharge: 2016-06-19 | DRG: 190 | Disposition: A | Discharge status: Home Health | Payer: Medicare Other | Attending: Internal Medicine | Admitting: Internal Medicine

## 2016-06-15 ENCOUNTER — Inpatient Hospital Stay (HOSPITAL_COMMUNITY): Payer: Medicare Other

## 2016-06-15 ENCOUNTER — Emergency Department (HOSPITAL_COMMUNITY): Payer: Medicare Other

## 2016-06-15 DIAGNOSIS — E871 Hypo-osmolality and hyponatremia: Secondary | ICD-10-CM | POA: Diagnosis present

## 2016-06-15 DIAGNOSIS — R7303 Prediabetes: Secondary | ICD-10-CM | POA: Diagnosis present

## 2016-06-15 DIAGNOSIS — D631 Anemia in chronic kidney disease: Secondary | ICD-10-CM | POA: Diagnosis present

## 2016-06-15 DIAGNOSIS — Z87891 Personal history of nicotine dependence: Secondary | ICD-10-CM | POA: Diagnosis not present

## 2016-06-15 DIAGNOSIS — Z6832 Body mass index (BMI) 32.0-32.9, adult: Secondary | ICD-10-CM

## 2016-06-15 DIAGNOSIS — R0602 Shortness of breath: Secondary | ICD-10-CM

## 2016-06-15 DIAGNOSIS — D72829 Elevated white blood cell count, unspecified: Secondary | ICD-10-CM | POA: Diagnosis present

## 2016-06-15 DIAGNOSIS — Z7982 Long term (current) use of aspirin: Secondary | ICD-10-CM

## 2016-06-15 DIAGNOSIS — J449 Chronic obstructive pulmonary disease, unspecified: Secondary | ICD-10-CM | POA: Diagnosis present

## 2016-06-15 DIAGNOSIS — E785 Hyperlipidemia, unspecified: Secondary | ICD-10-CM | POA: Diagnosis present

## 2016-06-15 DIAGNOSIS — R5381 Other malaise: Secondary | ICD-10-CM | POA: Diagnosis present

## 2016-06-15 DIAGNOSIS — E860 Dehydration: Secondary | ICD-10-CM | POA: Diagnosis present

## 2016-06-15 DIAGNOSIS — J9621 Acute and chronic respiratory failure with hypoxia: Secondary | ICD-10-CM | POA: Diagnosis present

## 2016-06-15 DIAGNOSIS — I5032 Chronic diastolic (congestive) heart failure: Secondary | ICD-10-CM | POA: Diagnosis present

## 2016-06-15 DIAGNOSIS — I10 Essential (primary) hypertension: Secondary | ICD-10-CM | POA: Diagnosis present

## 2016-06-15 DIAGNOSIS — R0609 Other forms of dyspnea: Secondary | ICD-10-CM

## 2016-06-15 DIAGNOSIS — J9611 Chronic respiratory failure with hypoxia: Secondary | ICD-10-CM | POA: Diagnosis present

## 2016-06-15 DIAGNOSIS — I130 Hypertensive heart and chronic kidney disease with heart failure and stage 1 through stage 4 chronic kidney disease, or unspecified chronic kidney disease: Secondary | ICD-10-CM | POA: Diagnosis present

## 2016-06-15 DIAGNOSIS — Z7951 Long term (current) use of inhaled steroids: Secondary | ICD-10-CM | POA: Diagnosis not present

## 2016-06-15 DIAGNOSIS — R739 Hyperglycemia, unspecified: Secondary | ICD-10-CM | POA: Diagnosis present

## 2016-06-15 DIAGNOSIS — N184 Chronic kidney disease, stage 4 (severe): Secondary | ICD-10-CM | POA: Diagnosis present

## 2016-06-15 DIAGNOSIS — N179 Acute kidney failure, unspecified: Secondary | ICD-10-CM | POA: Diagnosis present

## 2016-06-15 DIAGNOSIS — D649 Anemia, unspecified: Secondary | ICD-10-CM | POA: Diagnosis present

## 2016-06-15 DIAGNOSIS — K219 Gastro-esophageal reflux disease without esophagitis: Secondary | ICD-10-CM | POA: Diagnosis present

## 2016-06-15 DIAGNOSIS — J9811 Atelectasis: Secondary | ICD-10-CM | POA: Diagnosis present

## 2016-06-15 DIAGNOSIS — I2721 Secondary pulmonary arterial hypertension: Secondary | ICD-10-CM | POA: Diagnosis present

## 2016-06-15 DIAGNOSIS — Z91010 Allergy to peanuts: Secondary | ICD-10-CM

## 2016-06-15 DIAGNOSIS — J441 Chronic obstructive pulmonary disease with (acute) exacerbation: Principal | ICD-10-CM | POA: Diagnosis present

## 2016-06-15 DIAGNOSIS — J9620 Acute and chronic respiratory failure, unspecified whether with hypoxia or hypercapnia: Secondary | ICD-10-CM | POA: Diagnosis present

## 2016-06-15 HISTORY — DX: Acute kidney failure, unspecified: N17.9

## 2016-06-15 LAB — BASIC METABOLIC PANEL
Anion gap: 11 (ref 5–15)
BUN: 64 mg/dL — AB (ref 6–20)
CO2: 23 mmol/L (ref 22–32)
Calcium: 11.4 mg/dL — ABNORMAL HIGH (ref 8.9–10.3)
Chloride: 96 mmol/L — ABNORMAL LOW (ref 101–111)
Creatinine, Ser: 4.02 mg/dL — ABNORMAL HIGH (ref 0.44–1.00)
GFR calc Af Amer: 11 mL/min — ABNORMAL LOW (ref >60)
GFR, EST NON AFRICAN AMERICAN: 10 mL/min — AB (ref >60)
GLUCOSE: 166 mg/dL — AB (ref 65–99)
POTASSIUM: 4.1 mmol/L (ref 3.5–5.1)
Sodium: 130 mmol/L — ABNORMAL LOW (ref 135–145)

## 2016-06-15 LAB — URINALYSIS, ROUTINE W REFLEX MICROSCOPIC
Bilirubin Urine: NEGATIVE
Glucose, UA: NEGATIVE mg/dL
KETONES UR: NEGATIVE mg/dL
NITRITE: NEGATIVE
PROTEIN: 100 mg/dL — AB
Specific Gravity, Urine: 1.01 (ref 1.005–1.030)
pH: 5 (ref 5.0–8.0)

## 2016-06-15 LAB — BRAIN NATRIURETIC PEPTIDE: B Natriuretic Peptide: 47.2 pg/mL (ref 0.0–100.0)

## 2016-06-15 LAB — CBC WITH DIFFERENTIAL/PLATELET
BASOS ABS: 0 10*3/uL (ref 0.0–0.1)
Basophils Relative: 0 %
Eosinophils Absolute: 0 10*3/uL (ref 0.0–0.7)
Eosinophils Relative: 0 %
HCT: 24.2 % — ABNORMAL LOW (ref 36.0–46.0)
Hemoglobin: 8.4 g/dL — ABNORMAL LOW (ref 12.0–15.0)
LYMPHS PCT: 6 %
Lymphs Abs: 1 10*3/uL (ref 0.7–4.0)
MCH: 29.7 pg (ref 26.0–34.0)
MCHC: 34.7 g/dL (ref 30.0–36.0)
MCV: 85.5 fL (ref 78.0–100.0)
MONO ABS: 1.5 10*3/uL — AB (ref 0.1–1.0)
MONOS PCT: 9 %
Neutro Abs: 15.1 10*3/uL — ABNORMAL HIGH (ref 1.7–7.7)
Neutrophils Relative %: 85 %
PLATELETS: 281 10*3/uL (ref 150–400)
RBC: 2.83 MIL/uL — ABNORMAL LOW (ref 3.87–5.11)
RDW: 13.3 % (ref 11.5–15.5)
WBC: 17.7 10*3/uL — ABNORMAL HIGH (ref 4.0–10.5)

## 2016-06-15 LAB — CREATININE, URINE, RANDOM: CREATININE, URINE: 116.05 mg/dL

## 2016-06-15 LAB — SODIUM, URINE, RANDOM: SODIUM UR: 48 mmol/L

## 2016-06-15 MED ORDER — METHYLPREDNISOLONE SODIUM SUCC 125 MG IJ SOLR
125.0000 mg | Freq: Once | INTRAMUSCULAR | Status: AC
  Administered 2016-06-15: 125 mg via INTRAVENOUS
  Filled 2016-06-15: qty 2

## 2016-06-15 MED ORDER — AZITHROMYCIN 500 MG PO TABS
500.0000 mg | ORAL_TABLET | Freq: Every day | ORAL | Status: DC
  Administered 2016-06-16 – 2016-06-19 (×4): 500 mg via ORAL
  Filled 2016-06-15 (×5): qty 1

## 2016-06-15 MED ORDER — ASPIRIN EC 81 MG PO TBEC
81.0000 mg | DELAYED_RELEASE_TABLET | Freq: Every day | ORAL | Status: DC
  Administered 2016-06-15 – 2016-06-18 (×4): 81 mg via ORAL
  Filled 2016-06-15 (×5): qty 1

## 2016-06-15 MED ORDER — HYDRALAZINE HCL 20 MG/ML IJ SOLN: 10.0000 mg | INTRAMUSCULAR | Status: DC | PRN

## 2016-06-15 MED ORDER — ARFORMOTEROL TARTRATE 15 MCG/2ML IN NEBU
15.0000 ug | INHALATION_SOLUTION | Freq: Two times a day (BID) | RESPIRATORY_TRACT | Status: DC
  Administered 2016-06-15: 15 ug via RESPIRATORY_TRACT
  Filled 2016-06-15: qty 2

## 2016-06-15 MED ORDER — PANTOPRAZOLE SODIUM 40 MG PO TBEC
40.0000 mg | DELAYED_RELEASE_TABLET | Freq: Every day | ORAL | Status: DC
  Administered 2016-06-16 – 2016-06-19 (×4): 40 mg via ORAL
  Filled 2016-06-15 (×4): qty 1

## 2016-06-15 MED ORDER — ALBUTEROL SULFATE (2.5 MG/3ML) 0.083% IN NEBU
5.0000 mg | INHALATION_SOLUTION | Freq: Once | RESPIRATORY_TRACT | Status: AC
  Administered 2016-06-15: 5 mg via RESPIRATORY_TRACT
  Filled 2016-06-15: qty 6

## 2016-06-15 MED ORDER — DILTIAZEM HCL ER COATED BEADS 120 MG PO CP24
240.0000 mg | ORAL_CAPSULE | Freq: Every day | ORAL | Status: DC
  Administered 2016-06-16 – 2016-06-19 (×4): 240 mg via ORAL
  Filled 2016-06-15 (×4): qty 2

## 2016-06-15 MED ORDER — AZITHROMYCIN 250 MG PO TABS
500.0000 mg | ORAL_TABLET | Freq: Once | ORAL | Status: AC
  Administered 2016-06-15: 500 mg via ORAL
  Filled 2016-06-15: qty 2

## 2016-06-15 MED ORDER — LORATADINE 10 MG PO TABS: 10.0000 mg | ORAL_TABLET | Freq: Every day | ORAL | Status: DC | PRN

## 2016-06-15 MED ORDER — IPRATROPIUM-ALBUTEROL 0.5-2.5 (3) MG/3ML IN SOLN
3.0000 mL | Freq: Four times a day (QID) | RESPIRATORY_TRACT | Status: DC
  Administered 2016-06-15 – 2016-06-17 (×8): 3 mL via RESPIRATORY_TRACT
  Filled 2016-06-15 (×9): qty 3

## 2016-06-15 MED ORDER — ENOXAPARIN SODIUM 30 MG/0.3ML ~~LOC~~ SOLN
30.0000 mg | SUBCUTANEOUS | Status: DC
  Administered 2016-06-15 – 2016-06-18 (×4): 30 mg via SUBCUTANEOUS
  Filled 2016-06-15 (×4): qty 0.3

## 2016-06-15 MED ORDER — SODIUM CHLORIDE 0.9 % IV BOLUS (SEPSIS)
500.0000 mL | Freq: Once | INTRAVENOUS | Status: AC
  Administered 2016-06-15: 500 mL via INTRAVENOUS

## 2016-06-15 MED ORDER — METHYLPREDNISOLONE SODIUM SUCC 125 MG IJ SOLR
60.0000 mg | Freq: Four times a day (QID) | INTRAMUSCULAR | Status: DC
  Administered 2016-06-15 – 2016-06-17 (×7): 60 mg via INTRAVENOUS
  Filled 2016-06-15 (×6): qty 2

## 2016-06-15 MED ORDER — SODIUM CHLORIDE 0.9 % IV SOLN
INTRAVENOUS | Status: DC
  Administered 2016-06-15 – 2016-06-16 (×2): via INTRAVENOUS

## 2016-06-15 MED ORDER — BUDESONIDE 0.5 MG/2ML IN SUSP
0.2500 mg | Freq: Two times a day (BID) | RESPIRATORY_TRACT | Status: DC
  Administered 2016-06-15: 0.25 mg via RESPIRATORY_TRACT
  Filled 2016-06-15: qty 2

## 2016-06-15 MED ORDER — ALBUTEROL SULFATE (2.5 MG/3ML) 0.083% IN NEBU: 2.5000 mg | INHALATION_SOLUTION | RESPIRATORY_TRACT | Status: DC | PRN

## 2016-06-15 MED ORDER — FLUTICASONE PROPIONATE 50 MCG/ACT NA SUSP
2.0000 | Freq: Every day | NASAL | Status: DC | PRN
  Filled 2016-06-15: qty 16

## 2016-06-15 NOTE — Telephone Encounter (Signed)
MW  Please Advise-  Sick Message  This pt. Called EMS this am because she was trouble breathing and having increase sob and coughing alot and she was given a breathing treatment. Pt. C/o coughing, with thick white mucus, was wheezing but after she had the breathing treatment has made her better, Pt. Is currently wearing her o2 currently at 2L with her o2 at 97%, she stated when she called ems and they came to her house her o2 level was in the 70s and ems informed the pt. To call and find out if she can be prescribed a cough suppressant.

## 2016-06-15 NOTE — ED Notes (Signed)
ED Provider at bedside. 

## 2016-06-15 NOTE — H&P (Signed)
History and Physical    SHELI DORIN ZMO:[id number] DOB: June 12, 1936 DOA: 06/15/2016  PCP: Philis Fendt, MD  Patient coming from: Home.   Chief Complaint: sob in one week.   HPI: Mallory Chavez is a 80 y.o. female with medical history significant of COPD, hypertension, GERD, ? Diastolic CHF, was brought in by EMS for sob for in one week. It was associated with cough, wheezing , subjective fevers and body aches. On arrival to ED, she was requiring 2 lit of [place] oxygen to keep sats greater than 90%. CXR revealed COPD changes. No pedal edema. No chest pain or palpitations. No syncope, no orthopnea or pND.  Lab work showed leukocytosis, and creatinine of 4.  She denies any urinary issues, has no dysuria, no diarrhea, or constipation. No hematochezia or hemoptysis or hematemesis.  She denies any headaches or weakness.  She was referred to medical service for admission for evaluation of copd exacerbation and AKI.   Review of Systems: As per HPI otherwise 10 point review of systems negative.    Past Medical History:  Diagnosis Date  . Allergy   . Arthritis   . Asthma   . CHF (congestive heart failure) ([place]) 1980   controlled with meds  . GERD (gastroesophageal reflux disease)   . Hyperlipidemia   . Hypertension     Past Surgical History:  Procedure Laterality Date  . [place]  . TUMOR REMOVAL     lower back/benign and has come back  . VAGINAL HYSTERECTOMY       reports that she quit smoking about 23 years ago. Her smoking use included Cigarettes. She has a 30.00 pack-year smoking history. She has never used smokeless tobacco. She reports that she does not drink alcohol or use drugs.  Allergies  Allergen Reactions  . Peanuts [Peanut Oil] Swelling    Family of ESRD presents.   Prior to Admission medications   Medication Sig Start Date End Date Taking? Authorizing Provider  aspirin 81 MG tablet Take 81 mg by mouth at bedtime.    Yes Historical  Provider, MD  budesonide-formoterol [facility]) 160-4.5 MCG/ACT inhaler inhale 2 puffs by mouth twice daily 02/03/14  Yes Tanda Rockers, MD  COLCRYS 0.6 MG tablet Take 1 tablet by mouth daily as needed. 07/06/13  Yes Historical Provider, MD  diltiazem (CARDIZEM CD) 240 MG 24 hr capsule Take 240 mg by mouth daily.   Yes Historical Provider, MD  febuxostat (ULORIC) 40 MG tablet Take 40 mg by mouth daily.   Yes Historical Provider, MD  fluticasone (FLONASE) 50 MCG/ACT nasal spray Place 2 sprays into both nostrils daily as needed.  06/04/14  Yes Historical Provider, MD  furosemide (LASIX) 80 MG tablet Take 80 mg by mouth daily.   Yes Historical Provider, MD  loratadine (CLARITIN) 10 MG tablet Take 10 mg by mouth daily as needed for allergies.   Yes Historical Provider, MD  losartan-hydrochlorothiazide (HYZAAR) 100-12.5 MG tablet Take 1 tablet by mouth daily.   Yes Historical Provider, MD  omeprazole (PRILOSEC) 20 MG capsule Take 30-60 min before first meal of the day 01/16/13  Yes Tanda Rockers, MD  OVER THE COUNTER MEDICATION Tumeric Curcumin 500mg  daily at bedtime   Yes Historical Provider, MD  potassium chloride (K-DUR,KLOR-CON) 10 MEQ tablet Take 1 tablet by mouth at bedtime.  02/22/14  Yes Historical Provider, MD  PROAIR HFA 108 (90 BASE) MCG/ACT inhaler Inhale 2 puffs into the lungs every 4 (four) hours  as needed for wheezing or shortness of breath.  09/24/12  Yes Historical Provider, MD    Physical Exam: Vitals:   06/15/16 1715 06/15/16 1730 06/15/16 1745 06/15/16 1829  BP: 158/80 154/84 156/75 (!) 152/72  Pulse: 104 102 102 90  Resp: 15 15 16 18   Temp:    98.3 F (36.8 C)  TempSrc:    Oral  SpO2: 100% 100% 99% 97%  Weight:    83.9 kg (184 lb 15.5 oz)  Height:    5\' 4"  (1.626 m)      Constitutional: NAD, calm, comfortable Vitals:   06/15/16 1715 06/15/16 1730 06/15/16 1745 06/15/16 1829  BP: 158/80 154/84 156/75 (!) 152/72  Pulse: 104 102 102 90  Resp: 15 15 16 18   Temp:    98.3 F  (36.8 C)  TempSrc:    Oral  SpO2: 100% 100% 99% 97%  Weight:    83.9 kg (184 lb 15.5 oz)  Height:    5\' 4"  (1.626 m)   Eyes: PERRL, lids and conjunctivae normal ENMT: Mucous membranes are moist. Posterior pharynx clear of any exudate or lesions.Normal dentition.  Neck: normal, supple, no masses, no thyromegaly Respiratory: bilateral wheezing anterior and posteriorly. No rhonchi.  Cardiovascular: Regular rate and rhythm, no murmurs / rubs / gallops. No extremity edema. 2+ pedal pulses. No carotid bruits.  Abdomen: no tenderness, no masses palpated. No hepatosplenomegaly. Bowel sounds positive.  Musculoskeletal: no clubbing / cyanosis. No joint deformity upper and lower extremities. Good ROM, no contractures. Normal muscle tone.  Skin: no rashes, lesions, ulcers. No induration Neurologic: CN 2-12 grossly intact. Sensation intact, DTR normal. Strength 5/5 in all 4.  Psychiatric: Normal judgment and insight. Alert and oriented x 3. Normal mood.     Labs on Admission: I have personally reviewed following labs and imaging studies  CBC:  Recent Labs Lab 06/15/16 1526  WBC 17.7*  NEUTROABS 15.1*  HGB 8.4*  HCT 24.2*  MCV 85.5  PLT 109   Basic Metabolic Panel:  Recent Labs Lab 06/15/16 1526  NA 130*  K 4.1  CL 96*  CO2 23  GLUCOSE 166*  BUN 64*  CREATININE 4.02*  CALCIUM 11.4*   GFR: Estimated Creatinine Clearance: 12.3 mL/min (by C-G formula based on SCr of 4.02 mg/dL (H)). Liver Function Tests: No results for input(s): AST, ALT, ALKPHOS, BILITOT, PROT, ALBUMIN in the last 168 hours. No results for input(s): LIPASE, AMYLASE in the last 168 hours. No results for input(s): AMMONIA in the last 168 hours. Coagulation Profile: No results for input(s): INR, PROTIME in the last 168 hours. Cardiac Enzymes: No results for input(s): CKTOTAL, CKMB, CKMBINDEX, TROPONINI in the last 168 hours. BNP (last 3 results) No results for input(s): PROBNP in the last 8760  hours. HbA1C: No results for input(s): HGBA1C in the last 72 hours. CBG: No results for input(s): GLUCAP in the last 168 hours. Lipid Profile: No results for input(s): CHOL, HDL, LDLCALC, TRIG, CHOLHDL, LDLDIRECT in the last 72 hours. Thyroid Function Tests: No results for input(s): TSH, T4TOTAL, FREET4, T3FREE, THYROIDAB in the last 72 hours. Anemia Panel: No results for input(s): VITAMINB12, FOLATE, FERRITIN, TIBC, IRON, RETICCTPCT in the last 72 hours. Urine analysis:    Component Value Date/Time   COLORURINE YELLOW 06/15/2016 1804   APPEARANCEUR HAZY (A) 06/15/2016 1804   LABSPEC 1.010 06/15/2016 1804   PHURINE 5.0 06/15/2016 1804   GLUCOSEU NEGATIVE 06/15/2016 1804   HGBUR SMALL (A) 06/15/2016 [place] NEGATIVE 06/15/2016 1804  KETONESUR NEGATIVE 06/15/2016 1804   PROTEINUR 100 (A) 06/15/2016 1804   NITRITE NEGATIVE 06/15/2016 1804   LEUKOCYTESUR LARGE (A) 06/15/2016 1804   Sepsis Labs: !!!!!!!!!!!!!!!!!!!!!!!!!!!!!!!!!!!!!!!!!!!! @LABRCNTIP (procalcitonin:4,lacticidven:4) )No results found for this or any previous visit (from the past 240 hour(s)).   Radiological Exams on Admission: Dg Chest 2 View  Result Date: 06/15/2016 CLINICAL DATA:  Worsening shortness of breath over the past few days, intermittent shortness of breath for 2 weeks, history of asthma, CHF, hypertension EXAM: CHEST  2 VIEW COMPARISON:  10 19,015 FINDINGS: Enlargement of cardiac silhouette. Prominent central pulmonary arteries. Mediastinal contours normal. Respiratory motion artifacts degrade lateral view. Chronic bronchitic and emphysematous changes with bibasilar atelectasis. No acute infiltrate, pleural effusion or pneumothorax. Bones demineralized. IMPRESSION: COPD changes with bibasilar atelectasis. Enlargement of cardiac silhouette with question pulmonary arterial hypertension. Electronically Signed   By: Lavonia Dana M.D.   On: 06/15/2016 16:18    EKG: Independently reviewed. Sinus  tachycardia.   Assessment/Plan Active Problems:   Acute renal failure (ARF) (HCC)   COPD exacerbation (HCC)  acute respiratory failure with hypoxia requiring 2 lit of [place] oxygen secondary to acute copd exacerbation.  Admitted to tele, started on IV steroids, duonebs, pulmicort and brovana.  [place] oxygen as needed. IV zithromax for bronchitis.  Influenza and respiratory panel sent.     Acute renal failure:  Unclear etiology. She appears dehydrated on exam. BNP normal.  Baseline creatinine is 1.8 in 2014.  Probably worsening of CKD from uncontrolled hypertension and DM.  US renal, urine electrolytes and UA ordered.  Strict intake and out put.  Stop ACE inhibitors and nephrotoxins.    Chronic diastolic heart failure: Not fluid overloaded. Holding lasix for renal insufficiency.    Leukocytosis; Unclear etiology. She is afebrile, will get lactic acid.   Hypercalcemia:  ? Dehydration.  Gentle hydration and repeat calcium level in am.   Accelerated hypertension: cardizem and prn hydralazine ordered.   GERD: on protonix.    DVT prophylaxis: lovenox Code Status: full code.  Family Communication:grand daughter at bedside.  Disposition Plan: pending further eval.  Consults called: none.  Admission status: inpatient/ tele   Tyisha Cressy MD [facility] Pager [phone]  If 7PM-7AM, please contact night-coverage [email or web address] Password [facility]  06/15/2016, 7:54 PM

## 2016-06-15 NOTE — Telephone Encounter (Signed)
Pt aware of rec's per MW. States that she will go to [facility] or ED if symptoms persist or worsen. Nothing further needed.

## 2016-06-15 NOTE — ED Notes (Signed)
Admitting provider at bedside.

## 2016-06-15 NOTE — Telephone Encounter (Signed)
Can't treat this over the phone, will need to go to UC or ER if worsens/ ov with all meds next avail

## 2016-06-15 NOTE — ED Provider Notes (Signed)
[facility] DEPT Provider Note   CSN: [id number] Arrival date & time: 06/15/16 1440     History    Chief Complaint  Patient presents with  . Shortness of Breath     HPI Mallory Chavez is a 80 y.o. female.  80yo F w/ PMH including COPD on 2L home O2, CHF, HTN, HLD who p/w SOB. Patient reports a few day history of progressively worsening shortness of breath. This feels like COPD but her symptoms are worse than usual. She has used her albuterol at home, last treatment was this morning, with mild relief of symptoms but her symptoms quickly returned. She denies any associated chest pain, vomiting, or abdominal pain. She does report a cough productive of clear phlegm and some nasal congestion recently. She denies any fevers, sick contacts, or recent courses of steroids. No LE edema or recent weight gain.   Past Medical History:  Diagnosis Date  . Allergy   . Arthritis   . Asthma   . CHF (congestive heart failure) ([place]) 1980   controlled with meds  . GERD (gastroesophageal reflux disease)   . Hyperlipidemia   . Hypertension      Patient Active Problem List   Diagnosis Date Noted  . Morbid obesity due to excess calories ([place]) 01/17/2015  . Chronic respiratory failure with hypoxia ([place]) 04/26/2014  . Exercise hypoxemia 04/19/2014  . Renal failure 12/20/2012  . COPD GOLD II 11/10/2012  . Essential hypertension 10/03/2012  . SOB (shortness of breath) 10/01/2012    Past Surgical History:  Procedure Laterality Date  . [place]  . TUMOR REMOVAL     lower back/benign and has come back  . VAGINAL HYSTERECTOMY      OB History    No data available        Home Medications    Prior to Admission medications   Medication Sig Start Date End Date Taking? Authorizing Provider  aspirin 81 MG tablet Take 81 mg by mouth at bedtime.     Historical Provider, MD  atorvastatin (LIPITOR) 40 MG tablet Take 40 mg by mouth daily. Reported on 07/15/2015     Historical Provider, MD  budesonide-formoterol [facility]) 160-4.5 MCG/ACT inhaler inhale 2 puffs by mouth twice daily 02/03/14   Tanda Rockers, MD  COLCRYS 0.6 MG tablet Take 1 tablet by mouth daily as needed. 07/06/13   Historical Provider, MD  diclofenac sodium (VOLTAREN) 1 % GEL Apply 4 g topically 4 (four) times daily.    Historical Provider, MD  diltiazem (CARDIZEM CD) 240 MG 24 hr capsule Take 240 mg by mouth daily.    Historical Provider, MD  febuxostat (ULORIC) 40 MG tablet Take 40 mg by mouth daily.    Historical Provider, MD  fluticasone (FLONASE) 50 MCG/ACT nasal spray Place 2 sprays into both nostrils daily as needed.  06/04/14   Historical Provider, MD  furosemide (LASIX) 80 MG tablet Take 80 mg by mouth daily.    Historical Provider, MD  loratadine (CLARITIN) 10 MG tablet Take 10 mg by mouth daily as needed for allergies.    Historical Provider, MD  losartan-hydrochlorothiazide (HYZAAR) 100-12.5 MG tablet Take 1 tablet by mouth daily.    Historical Provider, MD  losartan-hydrochlorothiazide (HYZAAR) 100-25 MG per tablet Take 1 tablet by mouth daily. 07/20/13   Historical Provider, MD  omeprazole (PRILOSEC) 20 MG capsule Take 30-60 min before first meal of the day 01/16/13   Tanda Rockers, MD  OVER THE COUNTER MEDICATION  Tumeric Curcumin 500mg  daily at bedtime    Historical Provider, MD  potassium chloride (K-DUR,KLOR-CON) 10 MEQ tablet Take 1 tablet by mouth at bedtime.  02/22/14   Historical Provider, MD  pravastatin (PRAVACHOL) 40 MG tablet Take 40 mg by mouth Nightly.    Historical Provider, MD  PROAIR HFA 108 (90 BASE) MCG/ACT inhaler Inhale 2 puffs into the lungs every 4 (four) hours as needed for wheezing or shortness of breath.  09/24/12   Historical Provider, MD      No family history on file.   Social History  Substance Use Topics  . Smoking status: Former Smoker    Packs/day: 1.50    Years: 20.00    Types: Cigarettes    Quit date: 07/02/1992  . Smokeless tobacco: Never  Used  . Alcohol use No     Allergies     Peanuts [peanut oil]    Review of Systems  10 Systems reviewed and are negative for acute change except as noted in the HPI.   Physical Exam Updated Vital Signs BP 145/76   Pulse 111   Resp 17   SpO2 100%   Physical Exam  Constitutional: She is oriented to person, place, and time. She appears well-developed and well-nourished. No distress.  HENT:  Head: Normocephalic and atraumatic.  Moist mucous membranes  Eyes: Conjunctivae are normal. Pupils are equal, round, and reactive to light.  Neck: Neck supple.  Cardiovascular: Regular rhythm and normal heart sounds.   No murmur heard. Mild tachycardia  Pulmonary/Chest: No respiratory distress. She has wheezes.  Very mildly increased WOB, diminished BS b/l with expiratory wheezes  Abdominal: Soft. Bowel sounds are normal. She exhibits no distension. There is no tenderness.  Musculoskeletal: She exhibits no edema.  Neurological: She is alert and oriented to person, place, and time.  Fluent speech  Skin: Skin is warm and dry.  Psychiatric: She has a normal mood and affect. Judgment normal.  Nursing note and vitals reviewed.     ED Treatments / Results  Labs (all labs ordered are listed, but only abnormal results are displayed) Labs Reviewed  CBC WITH DIFFERENTIAL/PLATELET - Abnormal; Notable for the following:       Result Value   WBC 17.7 (*)    RBC 2.83 (*)    Hemoglobin 8.4 (*)    HCT 24.2 (*)    Neutro Abs 15.1 (*)    Monocytes Absolute 1.5 (*)    All other components within normal limits  BASIC METABOLIC PANEL  BRAIN NATRIURETIC PEPTIDE     EKG  EKG Interpretation  Date/Time:  Friday June 15 2016 15:03:29 EST Ventricular Rate:  116 PR Interval:  174 QRS Duration: 84 QT Interval:  302 QTC Calculation: 419 R Axis:   20 Text Interpretation:  Sinus tachycardia Low voltage QRS Nonspecific T wave abnormality Abnormal ECG tachycardia new from previous,  otherwise no significant changes Confirmed by Carlene Bickley MD, Fabrizio Filip [phone]) on 06/15/2016 3:06:24 PM         Radiology Dg Chest 2 View  Result Date: 06/15/2016 CLINICAL DATA:  Worsening shortness of breath over the past few days, intermittent shortness of breath for 2 weeks, history of asthma, CHF, hypertension EXAM: CHEST  2 VIEW COMPARISON:  10 19,015 FINDINGS: Enlargement of cardiac silhouette. Prominent central pulmonary arteries. Mediastinal contours normal. Respiratory motion artifacts degrade lateral view. Chronic bronchitic and emphysematous changes with bibasilar atelectasis. No acute infiltrate, pleural effusion or pneumothorax. Bones demineralized. IMPRESSION: COPD changes with bibasilar atelectasis. Enlargement of  cardiac silhouette with question pulmonary arterial hypertension. Electronically Signed   By: Lavonia Dana M.D.   On: 06/15/2016 16:18   US Renal  Result Date: 06/15/2016 CLINICAL DATA:  80 year old female with acute on chronic renal failure EXAM: RENAL / URINARY TRACT ULTRASOUND COMPLETE COMPARISON:  Renal ultrasound dated 02/16/2013 FINDINGS: Right Kidney: Length: 12 cm. There is mild increased renal echotexture. The there is no hydronephrosis or echogenic stone. There are multiple right renal cysts measuring up to 4.2 x 4.1 x 4.1 cm in the inferior pole of the right kidney. These cysts are grossly similar to the prior ultrasound. Left Kidney: Length: 11 cm. There is mild increased echotexture. There is no hydronephrosis or echogenic stone. Small left renal measure up to 1.6 cm in the lower pole. There is focal prominence of the lateral cortex of the midportion of the left kidney similar to the prior ultrasound and most likely representing a dromedary hump. Bladder: Appears normal for degree of bladder distention. Fatty infiltration of the liver. IMPRESSION: No hydronephrosis or echogenic stone. Mildly echogenic kidneys likely related to chronic kidney disease. Focal prominence of  the lateral interpolar aspect of the left kidney similar to the prior ultrasound and likely representing a dromedary hump. Fatty liver. Electronically Signed   By: Anner Crete M.D.   On: 06/15/2016 21:26    Procedures Procedures (including critical care time) Procedures  Medications Ordered in ED  Medications  methylPREDNISolone sodium succinate (SOLU-MEDROL) 125 mg/2 mL injection 125 mg (not administered)  albuterol (PROVENTIL) (2.5 MG/3ML) 0.083% nebulizer solution 5 mg (5 mg Nebulization Given 06/15/16 1509)     Initial Impression / Assessment and Plan / ED Course  I have reviewed the triage vital signs and the nursing notes.  Pertinent labs & imaging results that were available during my care of the patient were reviewed by me and considered in my medical decision making (see chart for details).  Clinical Course     Pt w/ COPD on chronic O2 p/w total days of worsening shortness of breath in the setting of cough and nasal congestion. She was awake and alert, no respiratory distress on exam. She was mildly tachycardic, likely because she was receiving albuterol treatments, afebrile, O2 saturation 100% on her home oxygen level. She had diminished breath sounds and expiratory wheezes bilaterally. Gave albuterol and Solu-Medrol and obtained above lab work as well as chest x-ray.  CXR negative acute. LAbs show WBC 17.7, BUN 64, Cr 4.02. Her last creatinine is from 2014 and is 1.8. I'm concerned about her severe kidney disease, unclear whether it is acute on chronic. Gave the patient a fluid bolus. On reexamination, she is breathing comfortably with improvement after albuterol and steroids. Because of her leukocytosis, covered for CAP w/ CTX and azithro. Discussed admission w/ hospitalist, Dr. Karleen Hampshire, appreciate assistance. Patient admitted for further treatment of her COPD exacerbation as well as workup of her renal failure.  Final Clinical Impressions(s) / ED Diagnoses   Final  diagnoses:  Acute renal failure, unspecified acute renal failure type (HCC)  COPD exacerbation (HCC)  Leukocytosis, unspecified type     New Prescriptions   No medications on file       Sharlett Iles, MD 06/16/16 0002

## 2016-06-15 NOTE — ED Notes (Signed)
Patient transported to X-ray 

## 2016-06-15 NOTE — ED Triage Notes (Signed)
Pt presents from PCP for evaluation of SOB worsening over the past few days. Pt reports intermittent SOB x 2 weeks but states she called EMS this AM and was given breathing txt. Pt has hx of asthma, wears 2L [place] chronically. Pt has decreased breath sounds and wheezing throughout.

## 2016-06-16 DIAGNOSIS — J441 Chronic obstructive pulmonary disease with (acute) exacerbation: Principal | ICD-10-CM

## 2016-06-16 DIAGNOSIS — R5381 Other malaise: Secondary | ICD-10-CM | POA: Diagnosis present

## 2016-06-16 DIAGNOSIS — J9620 Acute and chronic respiratory failure, unspecified whether with hypoxia or hypercapnia: Secondary | ICD-10-CM | POA: Diagnosis present

## 2016-06-16 DIAGNOSIS — J9621 Acute and chronic respiratory failure with hypoxia: Secondary | ICD-10-CM

## 2016-06-16 DIAGNOSIS — E871 Hypo-osmolality and hyponatremia: Secondary | ICD-10-CM | POA: Diagnosis present

## 2016-06-16 DIAGNOSIS — J449 Chronic obstructive pulmonary disease, unspecified: Secondary | ICD-10-CM

## 2016-06-16 DIAGNOSIS — J9611 Chronic respiratory failure with hypoxia: Secondary | ICD-10-CM

## 2016-06-16 DIAGNOSIS — R739 Hyperglycemia, unspecified: Secondary | ICD-10-CM | POA: Diagnosis present

## 2016-06-16 DIAGNOSIS — N179 Acute kidney failure, unspecified: Secondary | ICD-10-CM

## 2016-06-16 DIAGNOSIS — I5032 Chronic diastolic (congestive) heart failure: Secondary | ICD-10-CM

## 2016-06-16 DIAGNOSIS — D649 Anemia, unspecified: Secondary | ICD-10-CM | POA: Diagnosis present

## 2016-06-16 DIAGNOSIS — I10 Essential (primary) hypertension: Secondary | ICD-10-CM

## 2016-06-16 LAB — CBC
HCT: 27.1 % — ABNORMAL LOW (ref 36.0–46.0)
Hemoglobin: 9.2 g/dL — ABNORMAL LOW (ref 12.0–15.0)
MCH: 29.3 pg (ref 26.0–34.0)
MCHC: 33.9 g/dL (ref 30.0–36.0)
MCV: 86.3 fL (ref 78.0–100.0)
PLATELETS: 242 10*3/uL (ref 150–400)
RBC: 3.14 MIL/uL — AB (ref 3.87–5.11)
RDW: 13.6 % (ref 11.5–15.5)
WBC: 13.9 10*3/uL — AB (ref 4.0–10.5)

## 2016-06-16 LAB — BASIC METABOLIC PANEL
Anion gap: 10 (ref 5–15)
BUN: 62 mg/dL — AB (ref 6–20)
CALCIUM: 11.1 mg/dL — AB (ref 8.9–10.3)
CO2: 21 mmol/L — ABNORMAL LOW (ref 22–32)
CREATININE: 3.86 mg/dL — AB (ref 0.44–1.00)
Chloride: 98 mmol/L — ABNORMAL LOW (ref 101–111)
GFR calc non Af Amer: 10 mL/min — ABNORMAL LOW (ref >60)
GFR, EST AFRICAN AMERICAN: 12 mL/min — AB (ref >60)
Glucose, Bld: 176 mg/dL — ABNORMAL HIGH (ref 65–99)
Potassium: 4.4 mmol/L (ref 3.5–5.1)
SODIUM: 129 mmol/L — AB (ref 135–145)

## 2016-06-16 MED ORDER — ENSURE ENLIVE PO LIQD
237.0000 mL | Freq: Two times a day (BID) | ORAL | Status: DC
  Administered 2016-06-16 – 2016-06-18 (×4): 237 mL via ORAL

## 2016-06-16 MED ORDER — MOMETASONE FURO-FORMOTEROL FUM 200-5 MCG/ACT IN AERO
2.0000 | INHALATION_SPRAY | Freq: Two times a day (BID) | RESPIRATORY_TRACT | Status: DC
  Administered 2016-06-16 – 2016-06-19 (×7): 2 via RESPIRATORY_TRACT
  Filled 2016-06-16: qty 8.8

## 2016-06-16 NOTE — Progress Notes (Signed)
CCMD called and stated patient had a run of PVC's, checked on patient she is alert and oriented. Stated she had just been up to [facility] and box had fallen on floor.

## 2016-06-16 NOTE — Plan of Care (Signed)
Problem: Education: Goal: Knowledge of [place] [facility] Education information/materials will improve Outcome: Progressing POC reviewed with pt.   

## 2016-06-16 NOTE — Progress Notes (Signed)
Progress Note    Mallory Chavez  ZRA:[id number] DOB: [birth year]  DOA: 06/15/2016 PCP: Philis Fendt, MD    Brief Narrative:   Chief complaint: Follow-up respiratory failure  Mallory Chavez is an 80 y.o. female the PMH of chronic respiratory failure with recommendations for oxygen at bedtime/with activity, COPD, hypertension, GERD, chronic diastolic CHF (EF 54-56 percent, grade 1 diastolic dysfunction by echo done 11/2012) who was admitted 06/15/16 with a chief complaint of a one-week history of shortness of breath. In the ED, the patient was hypoxic and required 2 L of oxygen to keep her saturations greater than 90%. Chest x-ray showed findings consistent with COPD. Creatinine was elevated at 4.  Assessment/Plan:   Principal Problem:   Acute on chronic respiratory failure ([place]) Secondary to COPD exacerbation in a patient with underlying COPD GOLD II Currently being managed with albuterol as needed, empiric azithromycin, DuoNeb every 6 hours, Solu-Medrol 60 mg every 6 hours, and Dulera. Chest x-ray personally reviewed. Heart is enlarged with findings consistent with pulmonary artery hypertension and COPD. There is also bibasilar atelectasis. We'll order pulmonary toilet with flutter valve and incentive spirometry.   Active Problems:   Essential hypertension/accelerated hypertension Continue Cardizem and as needed hydralazine. Blood pressure reasonable.     Morbid obesity due to excess calories (HCC)    Acute renal failure (ARF) (HCC) Baseline creatinine 1.8. ACE inhibitor discontinued. Avoid other nephrotoxins. Renal ultrasound showed echogenic kidneys but no hydronephrosis or stones. Creatinine 4.02---> 3.86. Will need nephrology evaluation as an outpatient given her significant chronic kidney disease.    Chronic diastolic congestive heart failure (HCC) Physical exam findings more consistent with COPD exacerbation as opposed to acute diastolic CHF. Monitor fluid volume  status closely. D/C IV fluids. Continue to hold Lasix for now.    Hyponatremia Likely related to dehydration. Patient reports poor oral intake for several days prior to admission. Monitor.      Hyperglycemia No documentation of history of diabetes. Check hemoglobin A1c. Likely steroid-induced.    Normocytic anemia Likely anemia of chronic kidney disease.    Physical deconditioning PT evaluation.   Family Communication/Anticipated D/C date and plan/Code Status   DVT prophylaxis: Lovenox ordered. Code Status: Full Code.  Family Communication: No family at bedside. Disposition Plan: Lives alone. May need SNF for rehabilitation if she remains weak/unsteady on her feet.   Medical Consultants:    None.   Procedures:    None  Anti-Infectives:    Azithromycin 06/15/16--->  Subjective:   The patient reports that she feels weak and a bit unsteady on her feet. Reports that her appetite is a bit better today. She still dyspneic, but less so. Continues to have a cough productive of white colored mucus. Reports that her chest pressure is now gone..   Objective:    Vitals:   06/15/16 1829 06/15/16 2013 06/15/16 2253 06/16/16 0616  BP: (!) 152/72  140/71   Pulse: 90 87 84 81  Resp: 18 18 18 18   Temp: 98.3 F (36.8 C)  98.4 F (36.9 C) 97.7 F (36.5 C)  TempSrc: Oral  Oral Oral  SpO2: 97% 98% 99% 100%  Weight: 83.9 kg (184 lb 15.5 oz)  84.2 kg (185 lb 11.2 oz)   Height: 5\' 4"  (1.626 m)       Intake/Output Summary (Last 24 hours) at 06/16/16 0841 Last data filed at 06/16/16 0223  Gross per 24 hour  Intake  500 ml  Output                0 ml  Net              500 ml   Filed Weights   06/15/16 1626 06/15/16 1829 06/15/16 2253  Weight: 84.1 kg (185 lb 4.8 oz) 83.9 kg (184 lb 15.5 oz) 84.2 kg (185 lb 11.2 oz)    Exam: General exam: Appears calm and comfortable.  Respiratory system: Crackles right base, high pitched wheezes, faint. Mild increased  WOB. Cardiovascular system: S1 & S2 heard, RRR. No JVD,  rubs, gallops or clicks. No murmurs. Gastrointestinal system: Abdomen is nondistended, soft and nontender. No organomegaly or masses felt. Normal bowel sounds heard. Central nervous system: Alert and oriented. No focal neurological deficits. Extremities: No clubbing,  or cyanosis. No edema. Skin: No rashes, lesions or ulcers. Psychiatry: Judgement and insight appear normal. Mood & affect appropriate.   Data Reviewed:   I have personally reviewed following labs and imaging studies:  Labs: Basic Metabolic Panel:  Recent Labs Lab 06/15/16 1526 06/16/16 0441  NA 130* 129*  K 4.1 4.4  CL 96* 98*  CO2 23 21*  GLUCOSE 166* 176*  BUN 64* 62*  CREATININE 4.02* 3.86*  CALCIUM 11.4* 11.1*   GFR Estimated Creatinine Clearance: 12.8 mL/min (by C-G formula based on SCr of 3.86 mg/dL (H)). Liver Function Tests: No results for input(s): AST, ALT, ALKPHOS, BILITOT, PROT, ALBUMIN in the last 168 hours. No results for input(s): LIPASE, AMYLASE in the last 168 hours. No results for input(s): AMMONIA in the last 168 hours. Coagulation profile No results for input(s): INR, PROTIME in the last 168 hours.  CBC:  Recent Labs Lab 06/15/16 1526 06/16/16 0441  WBC 17.7* 13.9*  NEUTROABS 15.1*  --   HGB 8.4* 9.2*  HCT 24.2* 27.1*  MCV 85.5 86.3  PLT 281 242   Hgb A1c: No results for input(s): HGBA1C in the last 72 hours.  Microbiology No results found for this or any previous visit (from the past 240 hour(s)).  Radiology: Dg Chest 2 View  Result Date: 06/15/2016 CLINICAL DATA:  Worsening shortness of breath over the past few days, intermittent shortness of breath for 2 weeks, history of asthma, CHF, hypertension EXAM: CHEST  2 VIEW COMPARISON:  10 19,015 FINDINGS: Enlargement of cardiac silhouette. Prominent central pulmonary arteries. Mediastinal contours normal. Respiratory motion artifacts degrade lateral view. Chronic  bronchitic and emphysematous changes with bibasilar atelectasis. No acute infiltrate, pleural effusion or pneumothorax. Bones demineralized. IMPRESSION: COPD changes with bibasilar atelectasis. Enlargement of cardiac silhouette with question pulmonary arterial hypertension. Electronically Signed   By: Lavonia Dana M.D.   On: 06/15/2016 16:18   US Renal  Result Date: 06/15/2016 CLINICAL DATA:  80 year old female with acute on chronic renal failure EXAM: RENAL / URINARY TRACT ULTRASOUND COMPLETE COMPARISON:  Renal ultrasound dated 02/16/2013 FINDINGS: Right Kidney: Length: 12 cm. There is mild increased renal echotexture. The there is no hydronephrosis or echogenic stone. There are multiple right renal cysts measuring up to 4.2 x 4.1 x 4.1 cm in the inferior pole of the right kidney. These cysts are grossly similar to the prior ultrasound. Left Kidney: Length: 11 cm. There is mild increased echotexture. There is no hydronephrosis or echogenic stone. Small left renal measure up to 1.6 cm in the lower pole. There is focal prominence of the lateral cortex of the midportion of the left kidney similar to the prior ultrasound and most  likely representing a dromedary hump. Bladder: Appears normal for degree of bladder distention. Fatty infiltration of the liver. IMPRESSION: No hydronephrosis or echogenic stone. Mildly echogenic kidneys likely related to chronic kidney disease. Focal prominence of the lateral interpolar aspect of the left kidney similar to the prior ultrasound and likely representing a dromedary hump. Fatty liver. Electronically Signed   By: Anner Crete M.D.   On: 06/15/2016 21:26    Medications:   . aspirin EC  81 mg Oral QHS  . azithromycin  500 mg Oral Daily  . diltiazem  240 mg Oral Daily  . enoxaparin (LOVENOX) injection  30 mg Subcutaneous Q24H  . ipratropium-albuterol  3 mL Nebulization Q6H  . methylPREDNISolone (SOLU-MEDROL) injection  60 mg Intravenous Q6H  . mometasone-formoterol   2 puff Inhalation BID  . pantoprazole  40 mg Oral Daily   Continuous Infusions: . sodium chloride 100 mL/hr at 06/16/16 6415    Medical decision making is of high complexity and this patient is at high risk of deterioration, therefore this is a level 3 visit. (> 4 problem points, >4 data points, high risk)   LOS: 1 day   Eduar Kumpf  [facility] Pager [phone]. If unable to reach me by pager, please call my cell phone at [phone].  *Please refer to [email or web address], password [email or web address] to get updated schedule on who will round on this patient, as hospitalists switch teams weekly. If 7PM-7AM, please contact night-coverage at [email or web address], password [email or web address] for any overnight needs.  06/16/2016, 8:41 AM         \

## 2016-06-17 DIAGNOSIS — R0602 Shortness of breath: Secondary | ICD-10-CM

## 2016-06-17 LAB — RENAL FUNCTION PANEL
ANION GAP: 11 (ref 5–15)
Albumin: 2.9 g/dL — ABNORMAL LOW (ref 3.5–5.0)
BUN: 68 mg/dL — AB (ref 6–20)
CHLORIDE: 99 mmol/L — AB (ref 101–111)
CO2: 20 mmol/L — AB (ref 22–32)
Calcium: 11.2 mg/dL — ABNORMAL HIGH (ref 8.9–10.3)
Creatinine, Ser: 3.34 mg/dL — ABNORMAL HIGH (ref 0.44–1.00)
GFR calc Af Amer: 14 mL/min — ABNORMAL LOW (ref >60)
GFR calc non Af Amer: 12 mL/min — ABNORMAL LOW (ref >60)
GLUCOSE: 149 mg/dL — AB (ref 65–99)
PHOSPHORUS: 3.6 mg/dL (ref 2.5–4.6)
POTASSIUM: 4.8 mmol/L (ref 3.5–5.1)
Sodium: 130 mmol/L — ABNORMAL LOW (ref 135–145)

## 2016-06-17 LAB — HEMOGLOBIN A1C
HEMOGLOBIN A1C: 6.1 % — AB (ref 4.8–5.6)
Mean Plasma Glucose: 128 mg/dL

## 2016-06-17 MED ORDER — METHYLPREDNISOLONE SODIUM SUCC 125 MG IJ SOLR
60.0000 mg | Freq: Two times a day (BID) | INTRAMUSCULAR | Status: DC
  Administered 2016-06-17 – 2016-06-19 (×4): 60 mg via INTRAVENOUS
  Filled 2016-06-17 (×4): qty 2

## 2016-06-17 MED ORDER — IPRATROPIUM-ALBUTEROL 0.5-2.5 (3) MG/3ML IN SOLN
3.0000 mL | Freq: Three times a day (TID) | RESPIRATORY_TRACT | Status: DC
  Administered 2016-06-18 – 2016-06-19 (×4): 3 mL via RESPIRATORY_TRACT
  Filled 2016-06-17 (×4): qty 3

## 2016-06-17 NOTE — Progress Notes (Signed)
Progress Note    SAHITHI ORDOYNE  KGM:[id number] DOB: Jul 02, 1936  DOA: 06/15/2016 PCP: Philis Fendt, MD    Brief Narrative:   Chief complaint: Follow-up respiratory failure  Mallory Chavez is an 80 y.o. female the PMH of chronic respiratory failure with recommendations for oxygen at bedtime/with activity, COPD, hypertension, GERD, chronic diastolic CHF (EF 64-40 percent, grade 1 diastolic dysfunction by echo done 11/2012) who was admitted 06/15/16 with a chief complaint of a one-week history of shortness of breath. In the ED, the patient was hypoxic and required 2 L of oxygen to keep her saturations greater than 90%. Chest x-ray showed findings consistent with COPD. Creatinine was elevated at 4.  Assessment/Plan:   Principal Problem:   Acute on chronic respiratory failure ([place]) Secondary to COPD exacerbation in a patient with underlying COPD GOLD II Currently being managed with albuterol as needed, empiric azithromycin, DuoNeb every 6 hours, Solu-Medrol 60 mg every 6 hours, and Dulera. Wean Solu-Medrol. Chest x-ray personally reviewed. Heart is enlarged with findings consistent with pulmonary artery hypertension and COPD. There is also bibasilar atelectasis. Continue pulmonary toilet with flutter valve and incentive spirometry.   Active Problems:   Essential hypertension/accelerated hypertension Continue Cardizem and as needed hydralazine. Blood pressure reasonable.     Morbid obesity due to excess calories (HCC)    Acute renal failure (ARF) (HCC) Baseline creatinine 1.8. ACE inhibitor discontinued. Avoid other nephrotoxins. Renal ultrasound showed echogenic kidneys but no hydronephrosis or stones. Creatinine 4.02---> 3.86--->3.34. Will need nephrology evaluation as an outpatient given her significant chronic kidney disease.    Chronic diastolic congestive heart failure (HCC) Physical exam findings more consistent with COPD exacerbation as opposed to acute diastolic CHF.  Monitor fluid volume status closely. D/C IV fluids. Continue to hold Lasix for now.    Hyponatremia Likely related to dehydration. Patient reports poor oral intake for several days prior to admission. Monitor.      Hyperglycemia No documentation of history of diabetes. F/U hemoglobin A1c. Likely steroid-induced.    Normocytic anemia Likely anemia of chronic kidney disease.    Physical deconditioning PT evaluation.   Family Communication/Anticipated D/C date and plan/Code Status   DVT prophylaxis: Lovenox ordered. Code Status: Full Code.  Family Communication: No family at bedside. Disposition Plan: Lives alone. Status post PT evaluation: Home health PT with DME recommended.   Medical Consultants:    None.   Procedures:    None  Anti-Infectives:    Azithromycin 06/15/16--->  Subjective:   The patient reports that she feels like she is breathing a bit better today. Still has a cough productive of white colored sputum. Reports that she is using her flutter valve. No nausea or vomiting. No complaints of chest pain.  Objective:    Vitals:   06/16/16 2144 06/17/16 0231 06/17/16 0528 06/17/16 0826  BP: (!) 142/72  137/74   Pulse: 96  93   Resp: 16  16   Temp: 98.2 F (36.8 C)  97.8 F (36.6 C)   TempSrc: Oral  Oral   SpO2: 100% 97% 100% 98%  Weight:      Height:        Intake/Output Summary (Last 24 hours) at 06/17/16 0829 Last data filed at 06/17/16 3474  Gross per 24 hour  Intake          3103.51 ml  Output              140 ml  Net  2963.51 ml   Filed Weights   06/15/16 1626 06/15/16 1829 06/15/16 2253  Weight: 84.1 kg (185 lb 4.8 oz) 83.9 kg (184 lb 15.5 oz) 84.2 kg (185 lb 11.2 oz)    Exam: General exam: Appears calm and comfortable.  Respiratory system: Coarse. Mild increased WOB. Cardiovascular system: S1 & S2 heard, RRR. No JVD,  rubs, gallops or clicks. No murmurs. Gastrointestinal system: Abdomen is nondistended, soft and  nontender. No organomegaly or masses felt. Normal bowel sounds heard. Central nervous system: Alert and oriented. No focal neurological deficits. Extremities: No clubbing,  or cyanosis. No edema. Skin: No rashes, lesions or ulcers. Psychiatry: Judgement and insight appear normal. Mood & affect appropriate.   Data Reviewed:   I have personally reviewed following labs and imaging studies:  Labs: Basic Metabolic Panel:  Recent Labs Lab 06/15/16 1526 06/16/16 0441 06/17/16 0230  NA 130* 129* 130*  K 4.1 4.4 4.8  CL 96* 98* 99*  CO2 23 21* 20*  GLUCOSE 166* 176* 149*  BUN 64* 62* 68*  CREATININE 4.02* 3.86* 3.34*  CALCIUM 11.4* 11.1* 11.2*  PHOS  --   --  3.6   GFR Estimated Creatinine Clearance: 14.8 mL/min (by C-G formula based on SCr of 3.34 mg/dL (H)). Liver Function Tests:  Recent Labs Lab 06/17/16 0230  ALBUMIN 2.9*   CBC:  Recent Labs Lab 06/15/16 1526 06/16/16 0441  WBC 17.7* 13.9*  NEUTROABS 15.1*  --   HGB 8.4* 9.2*  HCT 24.2* 27.1*  MCV 85.5 86.3  PLT 281 242   Hgb A1c: No results for input(s): HGBA1C in the last 72 hours.  Microbiology No results found for this or any previous visit (from the past 240 hour(s)).  Radiology: Dg Chest 2 View  Result Date: 06/15/2016 CLINICAL DATA:  Worsening shortness of breath over the past few days, intermittent shortness of breath for 2 weeks, history of asthma, CHF, hypertension EXAM: CHEST  2 VIEW COMPARISON:  10 19,015 FINDINGS: Enlargement of cardiac silhouette. Prominent central pulmonary arteries. Mediastinal contours normal. Respiratory motion artifacts degrade lateral view. Chronic bronchitic and emphysematous changes with bibasilar atelectasis. No acute infiltrate, pleural effusion or pneumothorax. Bones demineralized. IMPRESSION: COPD changes with bibasilar atelectasis. Enlargement of cardiac silhouette with question pulmonary arterial hypertension. Electronically Signed   By: Lavonia Dana M.D.   On:  06/15/2016 16:18   US Renal  Result Date: 06/15/2016 CLINICAL DATA:  80 year old female with acute on chronic renal failure EXAM: RENAL / URINARY TRACT ULTRASOUND COMPLETE COMPARISON:  Renal ultrasound dated 02/16/2013 FINDINGS: Right Kidney: Length: 12 cm. There is mild increased renal echotexture. The there is no hydronephrosis or echogenic stone. There are multiple right renal cysts measuring up to 4.2 x 4.1 x 4.1 cm in the inferior pole of the right kidney. These cysts are grossly similar to the prior ultrasound. Left Kidney: Length: 11 cm. There is mild increased echotexture. There is no hydronephrosis or echogenic stone. Small left renal measure up to 1.6 cm in the lower pole. There is focal prominence of the lateral cortex of the midportion of the left kidney similar to the prior ultrasound and most likely representing a dromedary hump. Bladder: Appears normal for degree of bladder distention. Fatty infiltration of the liver. IMPRESSION: No hydronephrosis or echogenic stone. Mildly echogenic kidneys likely related to chronic kidney disease. Focal prominence of the lateral interpolar aspect of the left kidney similar to the prior ultrasound and likely representing a dromedary hump. Fatty liver. Electronically Signed   By:  Anner Crete M.D.   On: 06/15/2016 21:26    Medications:   . aspirin EC  81 mg Oral QHS  . azithromycin  500 mg Oral Daily  . diltiazem  240 mg Oral Daily  . enoxaparin (LOVENOX) injection  30 mg Subcutaneous Q24H  . feeding supplement (ENSURE ENLIVE)  237 mL Oral BID BM  . ipratropium-albuterol  3 mL Nebulization Q6H  . methylPREDNISolone (SOLU-MEDROL) injection  60 mg Intravenous Q6H  . mometasone-formoterol  2 puff Inhalation BID  . pantoprazole  40 mg Oral Daily   Continuous Infusions: . sodium chloride 10 mL/hr at 06/16/16 1447    Medical decision making is of high complexity and this patient is at high risk of deterioration, therefore this is a level 3 visit.  (> 4 problem points, 1 data points, high risk) Problems/DDx Points   Self limited or minor (max 2)       1   Established problem, stable       1     4  Established problem, worsening       2   New problem, no additional W/U planned (max 1)       3     3  New problem, additional W/U planned        4    Data Reviewed Points   Review/order clinical lab tests       1      1  Review/order x-rays       1   Review/order tests (Echo, EKG, PFTs, etc)       1   Discussion of test results w/ performing MD       1   Independent review of image, tracing or specimen       2   Decision to obtain old records       1   Review and summation of old records       2    Level of risk Presenting prob Diagnostics Management   Minimal 1 self limited/minor Labs CXR EKG/EEG U/A U/S Rest Gargles Bandages Dressings   Low 2 or more self limited/minor 1 stable chronic Acute uncomplicated illness Tests (PFTS) Non-CV imaging Arterial labs Biopsies of skin OTC drugs Minor surgery-no risk PT OT IVF without additives    Moderate 1 or more chronic illnesses w/ mild exac, progression or S/E from tx 2 or more stable chronic illnesses Undiagnosed new problem w/ uncertain prognosis Acute complicated injury  Stress tests Endoscopies with no risk factors Deep needle or incisional bx CV imaging without risk LP Thoracentesis Paracentesis Minor surgery w/ risks Elective major surgery w/ no risk (open, percutaneous or endoscopic) Prescription drugs Therapeutic nucl med IVF with additives Closed tx of fracture/dislocation    High Severe exac of chronic illness Acute or chronic illness/injury may pose a threat to life or bodily function (ARF) Change in neuro status    CV imaging w/ contrast and risk Cardio electophysiologic tests Endoscopies w/ risk Discography Elective major surgery Emergency major surgery Parenteral controlled substances Drug therapy req monitoring for toxicity DNR/de-escalation  of care    MDM Prob points Data points Risk   Straightforward    <1    <1    Min   Low complexity    2    2    Low   Moderate    3    3    Mod   High Complexity    4 or more  4 or more    High      LOS: 2 days   Chane Cowden  [facility] Pager [phone]. If unable to reach me by pager, please call my cell phone at [phone].  *Please refer to [email or web address], password [email or web address] to get updated schedule on who will round on this patient, as hospitalists switch teams weekly. If 7PM-7AM, please contact night-coverage at [email or web address], password [email or web address] for any overnight needs.  06/17/2016, 8:29 AM         \

## 2016-06-17 NOTE — Evaluation (Signed)
Physical Therapy Evaluation Patient Details Name: Mallory Chavez MRN: [id number] DOB: [birth year] Today's Date: 06/17/2016   History of Present Illness  Patient is a 80 yo female admitted 06/15/16 with increasing dyspnea.  Patient wtih acute on chronic resp failure with COPD exacerbation, and acute renal failure.   PMH:  COPD on home O2, HTN, CHF, obesity, HLD, asthma, arthritis    Clinical Impression  Patient presents with problems listed below.  Will benefit from acute PT to maximize functional independence prior to discharge home.  Patient with general weakness and slight imbalance.  Recommend HHPT f/u at d/c.  Recommend patient use rollator for gait for balance/safety and to assist with carrying O2 tank.    Follow Up Recommendations Home health PT;Supervision - Intermittent    Equipment Recommendations  3in1 (PT);Other (comment) (Rollator - RW with 4 wheels and seat)    Recommendations for Other Services       Precautions / Restrictions Precautions Precautions: None Restrictions Weight Bearing Restrictions: No      Mobility  Bed Mobility Overal bed mobility: Modified Independent             General bed mobility comments: Increased time  Transfers Overall transfer level: Needs assistance Equipment used: Straight cane Transfers: Sit to/from Stand Sit to Stand: Min guard         General transfer comment: Patient able to move to standing.  Assist for safety/balance only.  Ambulation/Gait Ambulation/Gait assistance: Min assist Ambulation Distance (Feet): 60 Feet Assistive device: Straight cane Gait Pattern/deviations: Step-through pattern;Decreased stride length;Shuffle;Drifts right/left Gait velocity: decreased Gait velocity interpretation: Below normal speed for age/gender General Gait Details: Patient demonstrates safe use of cane.  Patient with unsteady gait with cane, requiring assist x2 due to staggering.    Stairs            Wheelchair  Mobility    Modified Rankin (Stroke Patients Only)       Balance Overall balance assessment: Needs assistance         Standing balance support: Single extremity supported Standing balance-Leahy Scale: Poor                               Pertinent Vitals/Pain Pain Assessment: No/denies pain    Home Living Family/patient expects to be discharged to:: Private residence Living Arrangements: Alone Available Help at Discharge: Family;Available PRN/intermittently (Uses [facility] for medical transportation) Type of Home: Apartment Home Access: Level entry     Home Layout: One level Home Equipment: Cane - single point;Grab bars - toilet;Grab bars - tub/shower      Prior Function Level of Independence: Independent with assistive device(s)         Comments: Patient uses cane or walker at times.  Gilford Rile is in ill repair (received from a friend)     Journalist, newspaper        Extremity/Trunk Assessment   Upper Extremity Assessment Upper Extremity Assessment: Overall WFL for tasks assessed    Lower Extremity Assessment Lower Extremity Assessment: Generalized weakness    Cervical / Trunk Assessment Cervical / Trunk Assessment: Normal  Communication   Communication: No difficulties  Cognition Arousal/Alertness: Awake/alert Behavior During Therapy: WFL for tasks assessed/performed Overall Cognitive Status: Within Functional Limits for tasks assessed                      General Comments      Exercises     Assessment/Plan  PT Assessment Patient needs continued PT services  PT Problem List Decreased strength;Decreased activity tolerance;Decreased balance;Decreased mobility;Decreased knowledge of use of DME;Cardiopulmonary status limiting activity;Obesity          PT Treatment Interventions DME instruction;Gait training;Functional mobility training;Therapeutic activities;Balance training;Patient/family education    PT Goals (Current goals can be  found in the Care Plan section)  Acute Rehab PT Goals Patient Stated Goal: To return home PT Goal Formulation: With patient Time For Goal Achievement: 06/24/16 Potential to Achieve Goals: Good    Frequency Min 3X/week   Barriers to discharge Decreased caregiver support Patient lives alone.  Daughter and granddaughter work and are available prn.    Co-evaluation               End of Session Equipment Utilized During Treatment: Gait belt;Oxygen Activity Tolerance: Patient limited by fatigue Patient left: in bed;with call bell/phone within reach Nurse Communication: Mobility status         Time: 6063-0160 PT Time Calculation (min) (ACUTE ONLY): 32 min   Charges:   PT Evaluation $PT Eval Moderate Complexity: 1 Procedure PT Treatments $Gait Training: 8-22 mins   PT G Codes:        Despina Pole 2016-07-08, 1:03 PM Carita Pian. Sanjuana Kava, [place] Pager [phone]

## 2016-06-18 DIAGNOSIS — R7303 Prediabetes: Secondary | ICD-10-CM | POA: Diagnosis present

## 2016-06-18 LAB — BASIC METABOLIC PANEL
Anion gap: 12 (ref 5–15)
BUN: 72 mg/dL — ABNORMAL HIGH (ref 6–20)
CALCIUM: 11.4 mg/dL — AB (ref 8.9–10.3)
CO2: 22 mmol/L (ref 22–32)
CREATININE: 3.14 mg/dL — AB (ref 0.44–1.00)
Chloride: 100 mmol/L — ABNORMAL LOW (ref 101–111)
GFR calc Af Amer: 15 mL/min — ABNORMAL LOW (ref >60)
GFR calc non Af Amer: 13 mL/min — ABNORMAL LOW (ref >60)
GLUCOSE: 152 mg/dL — AB (ref 65–99)
Potassium: 4.5 mmol/L (ref 3.5–5.1)
Sodium: 134 mmol/L — ABNORMAL LOW (ref 135–145)

## 2016-06-18 MED ORDER — ACETAMINOPHEN 325 MG PO TABS
650.0000 mg | ORAL_TABLET | ORAL | Status: DC | PRN
  Filled 2016-06-18: qty 2

## 2016-06-18 NOTE — Progress Notes (Signed)
Initial Nutrition Assessment  DOCUMENTATION CODES:   Obesity unspecified  INTERVENTION:  Continue Ensure Enlive po BID, each supplement provides 350 kcal and 20 grams of protein.  Encourage adequate PO intake.   NUTRITION DIAGNOSIS:   Increased nutrient needs related to chronic illness as evidenced by estimated needs.  GOAL:   Patient will meet greater than or equal to 90% of their needs  MONITOR:   PO intake, Supplement acceptance, Labs, Weight trends, Skin, I & O's  REASON FOR ASSESSMENT:   Malnutrition Screening Tool    ASSESSMENT:   80 y.o. female the PMH of chronic respiratory failure with recommendations for oxygen at bedtime/with activity, COPD, hypertension, GERD, chronic diastolic CHF (EF 12-45 percent, grade 1 diastolic dysfunction by echo done 11/2012) who was admitted 06/15/16 with a chief complaint of a one-week history of shortness of breath. In the ED, the patient was hypoxic and required 2 L of oxygen to keep her saturations greater than 90%. Chest x-ray showed findings consistent with COPD. Creatinine was elevated at 4.  Meal completion 50-100%. RD unable to obtain nutrition history. Unable to complete Nutrition-Focused physical exam at this time. RD to perform at next visit. Pt with no significant weight loss per weight records. Pt currently has Ensure ordered. RD to continue with current orders. Labs and medications reviewed.   Diet Order:  Diet Heart Room service appropriate? Yes; Fluid consistency: Thin  Skin:  Reviewed, no issues  Last BM:  12/18  Height:   Ht Readings from Last 1 Encounters:  06/17/16 5\' 4"  (1.626 m)    Weight:   Wt Readings from Last 1 Encounters:  06/17/16 189 lb 14.4 oz (86.1 kg)    Ideal Body Weight:  54.5 kg  BMI:  Body mass index is 32.6 kg/m.  Estimated Nutritional Needs:   Kcal:  8099-8338  Protein:  85-100 grams  Fluid:  1.7 - 1.9 L/day  EDUCATION NEEDS:   No education needs identified at this  time  Corrin Parker, MS, RD, LDN Pager # [phone] After hours/ weekend pager # [phone]

## 2016-06-18 NOTE — Progress Notes (Signed)
Physical Therapy Treatment Patient Details Name: Mallory Chavez MRN: [id number] DOB: 1936-03-18 Today's Date: 06/18/2016    History of Present Illness Patient is a 80 yo female admitted 06/15/16 with increasing dyspnea.  Patient wtih acute on chronic resp failure with COPD exacerbation, and acute renal failure.   PMH:  COPD on home O2, HTN, CHF, obesity, HLD, asthma, arthritis    PT Comments    Progressing well with activity tolerance and used the rollator that she tried well and safely.  Follow Up Recommendations  Home health PT;Supervision - Intermittent     Equipment Recommendations  3in1 (PT);Other (comment) (4 wheeled rollator and water set up for her concentrator)    Recommendations for Other Services       Precautions / Restrictions Precautions Precautions: None    Mobility  Bed Mobility Overal bed mobility: Modified Independent                Transfers Overall transfer level: Needs assistance Equipment used: 4-wheeled walker Transfers: Sit to/from Stand Sit to Stand: Supervision         General transfer comment: safe and likely at a mod I level  Ambulation/Gait Ambulation/Gait assistance: Supervision Ambulation Distance (Feet): 150 Feet Assistive device: 4-wheeled walker Gait Pattern/deviations: Step-through pattern;Decreased stride length;Drifts right/left Gait velocity: decreased Gait velocity interpretation: at or above normal speed for age/gender General Gait Details: pt used the rollator well and demonstrated setting brakes to sit or stand up.   Stairs            Wheelchair Mobility    Modified Rankin (Stroke Patients Only)       Balance Overall balance assessment: Needs assistance Sitting-balance support: No upper extremity supported Sitting balance-Leahy Scale: Good     Standing balance support: Single extremity supported;No upper extremity supported Standing balance-Leahy Scale: Fair Standing balance comment: does  not accept challenge, but stood without assist                    Cognition Arousal/Alertness: Awake/alert Behavior During Therapy: WFL for tasks assessed/performed Overall Cognitive Status: Within Functional Limits for tasks assessed                      Exercises      General Comments        Pertinent Vitals/Pain Pain Assessment: No/denies pain    Home Living                      Prior Function            PT Goals (current goals can now be found in the care plan section) Acute Rehab PT Goals Patient Stated Goal: To return home PT Goal Formulation: With patient Time For Goal Achievement: 06/24/16 Potential to Achieve Goals: Good Progress towards PT goals: Progressing toward goals    Frequency    Min 3X/week      PT Plan Current plan remains appropriate    Co-evaluation             End of Session Equipment Utilized During Treatment: Oxygen Activity Tolerance: Patient tolerated treatment well Patient left: in bed;with call bell/phone within reach     Time: 5366-4403 PT Time Calculation (min) (ACUTE ONLY): 21 min  Charges:  $Gait Training: 8-22 mins                    G CodesTessie Fass Ricci Paff 06/18/2016, 5:09 PM  06/18/2016  Donnella Sham, [place] [phone]  (pager)

## 2016-06-18 NOTE — Care Management Note (Signed)
Case Management Note  Patient Details  Name: Mallory Chavez MRN: [id number] Date of Birth: Aug 28, 1935  Subjective/Objective:   CM following for progression and d/c planning.                  Action/Plan: 06/18/2016 Noted referral for CM re humidification for home CPAP , pt also has home oxygen, both from [facility]. This CM spoke with [facility] rep, Jermaine who states that all of their CPAP machines have an adjustable humidification flow. He took pt info and will have the office call the pt at home to explain how this device works. This CM spoke with the pt re this and ask that she call [facility] after she d/c from the hospital if she does not get a call right away.   Expected Discharge Date:                  Expected Discharge Plan:  [place]  In-House Referral:  NA  Discharge planning Services  CM Consult  Post Acute Care Choice:  Home Health Choice offered to:  Patient  DME Arranged:  N/A DME Agency:  [place]:    [place] Agency:     Status of Service:  In process, will continue to follow  If discussed at Long Length of Stay Meetings, dates discussed:    Additional Comments:  Adron Bene, RN 06/18/2016, 12:32 PM

## 2016-06-18 NOTE — Progress Notes (Signed)
Progress Note    Mallory Chavez  QAS:[id number] DOB: [birth year]  DOA: 06/15/2016 PCP: Philis Fendt, MD    Brief Narrative:   Chief complaint: Follow-up respiratory failure  Mallory Chavez is an 80 y.o. female the PMH of chronic respiratory failure with recommendations for oxygen at bedtime/with activity, COPD, hypertension, GERD, chronic diastolic CHF (EF 79-89 percent, grade 1 diastolic dysfunction by echo done 11/2012) who was admitted 06/15/16 with a chief complaint of a one-week history of shortness of breath. In the ED, the patient was hypoxic and required 2 L of oxygen to keep her saturations greater than 90%. Chest x-ray showed findings consistent with COPD. Creatinine was elevated at 4.  Assessment/Plan:   Principal Problem:   Acute on chronic respiratory failure ([place]) Secondary to COPD exacerbation in a patient with underlying COPD GOLD II Currently being managed with albuterol as needed, empiric azithromycin, DuoNeb every 6 hours, Solu-Medrol 60 mg every 12 hours, and Dulera. Weaning Solu-Medrol. Chest x-ray personally reviewed. Heart is enlarged with findings consistent with pulmonary artery hypertension and COPD. There is also bibasilar atelectasis. Continue pulmonary toilet with flutter valve and incentive spirometry.   Active Problems:   Essential hypertension/accelerated hypertension Continue Cardizem and as needed hydralazine. Blood pressure reasonable.     Morbid obesity due to excess calories (HCC)    Acute renal failure (ARF) (HCC) Baseline creatinine 1.8. ACE inhibitor discontinued. Avoid other nephrotoxins. Renal ultrasound showed echogenic kidneys but no hydronephrosis or stones. Creatinine 4.02---> 3.86--->3.34--->3.14. Will need nephrology evaluation as an outpatient given her significant chronic kidney disease.    Chronic diastolic congestive heart failure (HCC) Physical exam findings more consistent with COPD exacerbation as opposed to acute  diastolic CHF. Monitor fluid volume status closely. D/C IV fluids. Continue to hold Lasix for now.    Hyponatremia Likely related to dehydration. Patient reports poor oral intake for several days prior to admission. Improving with rehydration/holding Lasix.      Hyperglycemia/prediabetes Hemoglobin A1c 6.1%, consistent with prediabetes.     Normocytic anemia Likely anemia of chronic kidney disease.    Physical deconditioning PT evaluation.   Family Communication/Anticipated D/C date and plan/Code Status   DVT prophylaxis: Lovenox ordered. Code Status: Full Code.  Family Communication: No family at bedside. Disposition Plan: Lives alone. Status post PT evaluation: Home health PT with DME recommended.   Medical Consultants:    None.   Procedures:    None  Anti-Infectives:    Azithromycin 06/15/16--->  Subjective:   The patient reports that she feels likeHer breathing is slowly improving. Reports decreased cough, but still has a dry cough at times. Got a little lightheaded today when she was up. Appetite is fair. Complains of headache but no reports of chest pain. Concerned about going home because she lives alone and fell approximately 1 month ago.  Objective:    Vitals:   06/17/16 1738 06/17/16 2015 06/17/16 2052 06/18/16 0649  BP: (!) 149/77  (!) 158/74 135/65  Pulse: (!) 122  (!) 106 89  Resp: 18  18 18   Temp: 97.4 F (36.3 C)  98.7 F (37.1 C) 98.3 F (36.8 C)  TempSrc: Oral  Oral Oral  SpO2: 95% 96% 98% 100%  Weight:   86.1 kg (189 lb 14.4 oz)   Height:   5\' 4"  (1.626 m)     Intake/Output Summary (Last 24 hours) at 06/18/16 0825 Last data filed at 06/18/16 0657  Gross per 24 hour  Intake  1068.67 ml  Output              850 ml  Net           218.67 ml   Filed Weights   06/15/16 1829 06/15/16 2253 06/17/16 2052  Weight: 83.9 kg (184 lb 15.5 oz) 84.2 kg (185 lb 11.2 oz) 86.1 kg (189 lb 14.4 oz)    Exam: General exam: Appears calm and  comfortable.  Respiratory system: Coarse. Mild increased WOB. Cardiovascular system: S1 & S2 heard, RRR. No JVD,  rubs, gallops or clicks. No murmurs. Gastrointestinal system: Abdomen is nondistended, soft and nontender. No organomegaly or masses felt. Normal bowel sounds heard. Central nervous system: Alert and oriented. No focal neurological deficits. Extremities: No clubbing,  or cyanosis. No edema. Skin: No rashes, lesions or ulcers. Psychiatry: Judgement and insight appear normal. Mood & affect appropriate.   Data Reviewed:   I have personally reviewed following labs and imaging studies:  Labs: Basic Metabolic Panel:  Recent Labs Lab 06/15/16 1526 06/16/16 0441 06/17/16 0230 06/18/16 0619  NA 130* 129* 130* 134*  K 4.1 4.4 4.8 4.5  CL 96* 98* 99* 100*  CO2 23 21* 20* 22  GLUCOSE 166* 176* 149* 152*  BUN 64* 62* 68* 72*  CREATININE 4.02* 3.86* 3.34* 3.14*  CALCIUM 11.4* 11.1* 11.2* 11.4*  PHOS  --   --  3.6  --    GFR Estimated Creatinine Clearance: 15.9 mL/min (by C-G formula based on SCr of 3.14 mg/dL (H)). Liver Function Tests:  Recent Labs Lab 06/17/16 0230  ALBUMIN 2.9*   CBC:  Recent Labs Lab 06/15/16 1526 06/16/16 0441  WBC 17.7* 13.9*  NEUTROABS 15.1*  --   HGB 8.4* 9.2*  HCT 24.2* 27.1*  MCV 85.5 86.3  PLT 281 242   Hgb A1c:  Recent Labs  06/16/16 0441  HGBA1C 6.1*    Microbiology No results found for this or any previous visit (from the past 240 hour(s)).  Radiology: No results found.  Medications:   . aspirin EC  81 mg Oral QHS  . azithromycin  500 mg Oral Daily  . diltiazem  240 mg Oral Daily  . enoxaparin (LOVENOX) injection  30 mg Subcutaneous Q24H  . feeding supplement (ENSURE ENLIVE)  237 mL Oral BID BM  . ipratropium-albuterol  3 mL Nebulization TID  . methylPREDNISolone (SOLU-MEDROL) injection  60 mg Intravenous Q12H  . mometasone-formoterol  2 puff Inhalation BID  . pantoprazole  40 mg Oral Daily   Continuous  Infusions: . sodium chloride Stopped (06/18/16 4967)    Medical decision making is of high complexity and this patient is at high risk of deterioration, therefore this is a level 3 visit. (> 4 problem points, 1 data points, Moderate risk)    LOS: 3 days   Airrion Otting  [facility] Pager [phone]. If unable to reach me by pager, please call my cell phone at [phone].  *Please refer to [email or web address], password [email or web address] to get updated schedule on who will round on this patient, as hospitalists switch teams weekly. If 7PM-7AM, please contact night-coverage at [email or web address], password [email or web address] for any overnight needs.  06/18/2016, 8:25 AM         \

## 2016-06-18 NOTE — Progress Notes (Signed)
   06/18/16 1055  Clinical Encounter Type  Visited With Patient  Visit Type Follow-up  Referral From Chaplain  Consult/Referral To Chaplain  Spiritual Encounters  Spiritual Needs Prayer  Stress Factors  Patient Stress Factors None identified  pt. Is not available resting.  Will suggest a follow-up for prayer if still needed.  [employer] [phone]

## 2016-06-19 LAB — BASIC METABOLIC PANEL
Anion gap: 10 (ref 5–15)
BUN: 79 mg/dL — AB (ref 6–20)
CHLORIDE: 100 mmol/L — AB (ref 101–111)
CO2: 25 mmol/L (ref 22–32)
CREATININE: 3.03 mg/dL — AB (ref 0.44–1.00)
Calcium: 11.5 mg/dL — ABNORMAL HIGH (ref 8.9–10.3)
GFR calc Af Amer: 16 mL/min — ABNORMAL LOW (ref >60)
GFR calc non Af Amer: 14 mL/min — ABNORMAL LOW (ref >60)
Glucose, Bld: 158 mg/dL — ABNORMAL HIGH (ref 65–99)
POTASSIUM: 5 mmol/L (ref 3.5–5.1)
Sodium: 135 mmol/L (ref 135–145)

## 2016-06-19 MED ORDER — PREDNISONE 10 MG (21) PO TBPK: ORAL_TABLET | ORAL | 0 refills | Status: DC

## 2016-06-19 MED ORDER — ENSURE ENLIVE PO LIQD: 237.0000 mL | Freq: Two times a day (BID) | ORAL | 12 refills | Status: DC

## 2016-06-19 NOTE — Progress Notes (Signed)
Discharge instructions and medications discussed with patient.  All questions answered. Walker delivered to room.  All questions answered.

## 2016-06-19 NOTE — Discharge Instructions (Signed)
Chronic Obstructive Pulmonary Disease Chronic obstructive pulmonary disease (COPD) is a common lung condition in which airflow from the lungs is limited. COPD is a general term that can be used to describe many different lung problems that limit airflow, including both chronic bronchitis and emphysema. If you have COPD, your lung function will probably never return to normal, but there are measures you can take to improve lung function and make yourself feel better. What are the causes?  Smoking (common).  Exposure to secondhand smoke.  Genetic problems.  Chronic inflammatory lung diseases or recurrent infections. What are the signs or symptoms?  Shortness of breath, especially with physical activity.  Deep, persistent (chronic) cough with a large amount of thick mucus.  Wheezing.  Rapid breaths (tachypnea).  Gray or bluish discoloration (cyanosis) of the skin, especially in your fingers, toes, or lips.  Fatigue.  Weight loss.  Frequent infections or episodes when breathing symptoms become much worse (exacerbations).  Chest tightness. How is this diagnosed? Your health care provider will take a medical history and perform a physical examination to diagnose COPD. Additional tests for COPD may include:  Lung (pulmonary) function tests.  Chest X-ray.  CT scan.  Blood tests. How is this treated? Treatment for COPD may include:  Inhaler and nebulizer medicines. These help manage the symptoms of COPD and make your breathing more comfortable.  Supplemental oxygen. Supplemental oxygen is only helpful if you have a low oxygen level in your blood.  Exercise and physical activity. These are beneficial for nearly all people with COPD.  Lung surgery or transplant.  Nutrition therapy to gain weight, if you are underweight.  Pulmonary rehabilitation. This may involve working with a team of health care providers and specialists, such as respiratory, occupational, and physical  therapists. Follow these instructions at home:  Take all medicines (inhaled or pills) as directed by your health care provider.  Avoid over-the-counter medicines or cough syrups that dry up your airway (such as antihistamines) and slow down the elimination of secretions unless instructed otherwise by your health care provider.  If you are a smoker, the most important thing that you can do is stop smoking. Continuing to smoke will cause further lung damage and breathing trouble. Ask your health care provider for help with quitting smoking. He or she can direct you to community resources or hospitals that provide support.  Avoid exposure to irritants such as smoke, chemicals, and fumes that aggravate your breathing.  Use oxygen therapy and pulmonary rehabilitation if directed by your health care provider. If you require home oxygen therapy, ask your health care provider whether you should purchase a pulse oximeter to measure your oxygen level at home.  Avoid contact with individuals who have a contagious illness.  Avoid extreme temperature and humidity changes.  Eat healthy foods. Eating smaller, more frequent meals and resting before meals may help you maintain your strength.  Stay active, but balance activity with periods of rest. Exercise and physical activity will help you maintain your ability to do things you want to do.  Preventing infection and hospitalization is very important when you have COPD. Make sure to receive all the vaccines your health care provider recommends, especially the pneumococcal and influenza vaccines. Ask your health care provider whether you need a pneumonia vaccine.  Learn and use relaxation techniques to manage stress.  Learn and use controlled breathing techniques as directed by your health care provider. Controlled breathing techniques include: 1. Pursed lip breathing. Start by breathing in (inhaling)  through your nose for 1 second. Then, purse your lips as  if you were going to whistle and breathe out (exhale) through the pursed lips for 2 seconds. 2. Diaphragmatic breathing. Start by putting one hand on your abdomen just above your waist. Inhale slowly through your nose. The hand on your abdomen should move out. Then purse your lips and exhale slowly. You should be able to feel the hand on your abdomen moving in as you exhale.  Learn and use controlled coughing to clear mucus from your lungs. Controlled coughing is a series of short, progressive coughs. The steps of controlled coughing are: 1. Lean your head slightly forward. 2. Breathe in deeply using diaphragmatic breathing. 3. Try to hold your breath for 3 seconds. 4. Keep your mouth slightly open while coughing twice. 5. Spit any mucus out into a tissue. 6. Rest and repeat the steps once or twice as needed. Contact a health care provider if:  You are coughing up more mucus than usual.  There is a change in the color or thickness of your mucus.  Your breathing is more labored than usual.  Your breathing is faster than usual. Get help right away if:  You have shortness of breath while you are resting.  You have shortness of breath that prevents you from:  Being able to talk.  Performing your usual physical activities.  You have chest pain lasting longer than 5 minutes.  Your skin color is more cyanotic than usual.  You measure low oxygen saturations for longer than 5 minutes with a pulse oximeter. This information is not intended to replace advice given to you by your health care provider. Make sure you discuss any questions you have with your health care provider. Document Released: 03/28/2005 Document Revised: 11/24/2015 Document Reviewed: 02/12/2013 Elsevier Interactive Patient Education  2017 Elsevier Inc.   Acute Kidney Injury, Adult Acute kidney injury (AKI) occurs when there is sudden (acute) damage to the kidneys.A small amount of kidney damage may not cause  problems, but a large amount of damage may make it difficult or impossible for the kidneys to work the way they should. AKI may develop into long-lasting (chronic) kidney disease. Early detection and treatment of AKI may prevent kidney damage from becoming permanent or getting worse. What are the causes? Common causes of this condition include:  A problem with blood flow to the kidneys. This may be caused by:  Blood loss.  Heart and blood vessel (cardiovascular) disease.  Severe burns.  Liver disease.  Direct damage to the kidneys. This may be caused by:  Some medicines.  A kidney infection.  Poisoning.  Being around or in contact with poisonous (toxic) substances.  A surgical wound.  A hard, direct force to the kidney area.  A sudden blockage of urine flow. This may be caused by:  Cancer.  Kidney stones.  Enlarged prostate in males. What are the signs or symptoms? Symptoms develop slowly and may not be obvious until the kidney damage becomes severe. It is possible to have AKI for years without showing any symptoms. Symptoms of this condition can include:  Swelling (edema) of the face, legs, ankles, or feet.  Numbness, tingling, or loss of feeling (sensation) in the hands or feet.  Tiredness (lethargy).  Nausea or vomiting.  Confusion or trouble concentrating.  Problems with urination, such as:  Painful or burning feeling during urination.  Decreased urine production.  Frequent urination, especially at night.  Bloody urine.  Muscle twitches and cramps,  especially in the legs.  Shortness of breath.  Weakness.  Constant itchiness.  Loss of appetite.  Metallic taste in the mouth.  Trouble sleeping.  Pale lining of the eyelids and surface of the eye (conjunctiva). How is this diagnosed? This condition may be diagnosed with various tests. Tests may include:  Blood tests.  Urine tests.  Imaging tests.  A test in which a sample of tissue is  removed from the kidneys to be looked at under a microscope (kidney biopsy). How is this treated? Treatment of AKIvaries depending on the cause and severity of the kidney damage. In mild cases, treatment may not be needed. The kidneys may heal on their own. If AKI is more severe, your health care provider will treat the cause of the kidney damage, help the kidneys heal, and prevent problems from occurring. Severe cases may require a procedure to remove toxic wastes from the body (dialysis) or surgery to repair kidney damage. Surgery may involve:  Repair of a torn kidney.  Removal of a urine flow obstruction. Follow these instructions at home:  Follow your prescribed diet.  Take over-the-counter and prescription medicines only as told by your health care provider.  Do not take any new medicines unless approved by your health care provider. Many medicines can worsen your kidney damage.  Do not take any vitamin and mineral supplements unless approved by your health care provider. Many nutritional supplements can worsen your kidney damage.  The dose of some medicines that you take may need to be adjusted.  Do not use any tobacco products, such as cigarettes, chewing tobacco, and e-cigarettes. If you need help quitting, ask your health care provider.  Keep all follow-up visits as told by your health care provider. This is important.  Keep track of your blood pressure. Report changes in your blood pressure as told by your health care provider.  Achieve and maintain a healthy weight. If you need help with this, ask your health care provider.  Start or continue an exercise plan. Try to exercise at least 30 minutes a day, 5 days a week.  Stay current with immunizations as told by your health care provider. Where to find more information:  American Association of Kidney Patients: BombTimer.gl  National Kidney Foundation: www.kidney.[place]: https://mathis.com/  Life Options  Rehabilitation Program: www.lifeoptions.org and www.kidneyschool.org Contact a health care provider if:  Your symptoms get worse.  You develop new symptoms. Get help right away if:  You develop symptoms of end-stage kidney disease, which include:  Headaches.  Abnormally dark or light skin.  Numbness in the hands or feet.  Easy bruising.  Frequent hiccups.  Chest pain.  Shortness of breath.  End of menstruation in women.  You have a fever.  You have decreased urine production.  You have pain or bleeding when you urinate. This information is not intended to replace advice given to you by your health care provider. Make sure you discuss any questions you have with your health care provider. Document Released: 01/01/2011 Document Revised: 01/26/2016 Document Reviewed: 02/15/2012 Elsevier Interactive Patient Education  2017 [employer].

## 2016-06-19 NOTE — Discharge Summary (Signed)
Physician Discharge Summary  Mallory Chavez:[id number] DOB: [birth year] DOA: 06/15/2016  PCP: Philis Fendt, MD  Admit date: 06/15/2016 Discharge date: 06/19/2016  Admitted From: Home Discharge disposition: Home   Recommendations for Outpatient Follow-Up:   1. Patient requested to be referred to a new PCP. There are some health literacy concerns. 2. Home health RN and PT set up. BMET to be drawn next week and results faxed to Dr. Florene Glen.  Needs close follow-up of her chronic kidney disease.   Discharge Diagnosis:   Principal Problem:    Acute on chronic respiratory failure (HCC) Active Problems:    SOB (shortness of breath)    Essential hypertension    COPD GOLD II    Chronic respiratory failure with hypoxia (HCC)    Morbid obesity due to excess calories (HCC)    Acute renal failure (ARF) (HCC)    COPD exacerbation (HCC)    Chronic diastolic congestive heart failure (HCC)    Physical deconditioning    Normocytic anemia    Hyperglycemia    Hyponatremia    Prediabetes   Discharge Condition: Improved.  Diet recommendation: Low sodium, heart healthy.    History of Present Illness:   Mallory Chavez is an 80 y.o. female the PMH of chronic respiratory failure with recommendations for oxygen at bedtime/with activity, COPD, hypertension, GERD, chronic diastolic CHF (EF 01-02 percent, grade 1 diastolic dysfunction by echo done 11/2012) who was admitted 06/15/16 with a chief complaint of a one-week history of shortness of breath. In the ED, the patient was hypoxic and required 2 L of oxygen to keep her saturations greater than 90%. Chest x-ray showed findings consistent with COPD. Creatinine was elevated at 4.   Hospital Course by Problem:   Principal Problem:   Acute on chronic respiratory failure (HCC) Secondary to COPD exacerbation in a patient with underlying COPD GOLD II Treated with albuterol as needed, empiric azithromycin, DuoNeb every 6  hours, Solu-Medrol and Dulera.  Chest x-ray personally reviewed. Heart is enlarged with findings consistent with pulmonary artery hypertension and COPD. There is also bibasilar atelectasis. Continue pulmonary toilet with flutter valve and incentive spirometry. Discharged home on a prednisone taper.   Active Problems:   Essential hypertension/accelerated hypertension Continue Cardizem and as needed hydralazine. Blood pressure reasonable.     Morbid obesity due to excess calories (HCC)    Acute renal failure (ARF) (HCC) Baseline creatinine 1.8. ACE inhibitor discontinued. Avoid other nephrotoxins. Renal ultrasound showed echogenic kidneys but no hydronephrosis or stones. Creatinine 4.02---> 3.86--->3.34--->3.14--->3.03. Will need nephrology evaluation as an outpatient given her significant chronic kidney disease.    Chronic diastolic congestive heart failure (HCC) Physical exam findings more consistent with COPD exacerbation as opposed to acute diastolic CHF. Monitor fluid volume status closely. D/C IV fluids. Patient instructed to hold her Lasix.    Hyponatremia Likely related to dehydration. Patient reports poor oral intake for several days prior to admission. Improving with rehydration/holding Lasix.      Hyperglycemia/prediabetes Hemoglobin A1c 6.1%, consistent with prediabetes.     Normocytic anemia Likely anemia of chronic kidney disease.    Physical deconditioning PT evaluation performed, home PT ordered per recommendations.  Medical Consultants:    None.   Discharge Exam:   Vitals:   06/19/16 0533 06/19/16 0912  BP: (!) 128/36 (!) 152/71  Pulse: 66 (!) 109  Resp: 18 18  Temp: 98.4 F (36.9 C) 97.8 F (36.6 C)   Vitals:   06/18/16 1942 06/18/16 2204 06/19/16  0533 06/19/16 0912  BP:  131/72 (!) 128/36 (!) 152/71  Pulse:  (!) 106 66 (!) 109  Resp:  20 18 18   Temp:  98.1 F (36.7 C) 98.4 F (36.9 C) 97.8 F (36.6 C)  TempSrc:  Oral Oral Oral  SpO2: 98%  100% 95% 98%  Weight:  86 kg (189 lb 8 oz)    Height:       General exam: Appears calm and comfortable.  Respiratory system: Coarse. Mild increased WOB. Cardiovascular system: S1 & S2 heard, RRR. No JVD,  rubs, gallops or clicks. No murmurs. Gastrointestinal system: Abdomen is nondistended, soft and nontender. No organomegaly or masses felt. Normal bowel sounds heard. Central nervous system: Alert and oriented. No focal neurological deficits. Extremities: No clubbing,  or cyanosis. No edema. Skin: No rashes, lesions or ulcers. Psychiatry: Judgement and insight appear normal. Mood & affect appropriate.    The results of significant diagnostics from this hospitalization (including imaging, microbiology, ancillary and laboratory) are listed below for reference.     Procedures and Diagnostic Studies:   Dg Chest 2 View  Result Date: 06/15/2016 CLINICAL DATA:  Worsening shortness of breath over the past few days, intermittent shortness of breath for 2 weeks, history of asthma, CHF, hypertension EXAM: CHEST  2 VIEW COMPARISON:  10 19,015 FINDINGS: Enlargement of cardiac silhouette. Prominent central pulmonary arteries. Mediastinal contours normal. Respiratory motion artifacts degrade lateral view. Chronic bronchitic and emphysematous changes with bibasilar atelectasis. No acute infiltrate, pleural effusion or pneumothorax. Bones demineralized. IMPRESSION: COPD changes with bibasilar atelectasis. Enlargement of cardiac silhouette with question pulmonary arterial hypertension. Electronically Signed   By: Lavonia Dana M.D.   On: 06/15/2016 16:18   US Renal  Result Date: 06/15/2016 CLINICAL DATA:  80 year old female with acute on chronic renal failure EXAM: RENAL / URINARY TRACT ULTRASOUND COMPLETE COMPARISON:  Renal ultrasound dated 02/16/2013 FINDINGS: Right Kidney: Length: 12 cm. There is mild increased renal echotexture. The there is no hydronephrosis or echogenic stone. There are multiple right  renal cysts measuring up to 4.2 x 4.1 x 4.1 cm in the inferior pole of the right kidney. These cysts are grossly similar to the prior ultrasound. Left Kidney: Length: 11 cm. There is mild increased echotexture. There is no hydronephrosis or echogenic stone. Small left renal measure up to 1.6 cm in the lower pole. There is focal prominence of the lateral cortex of the midportion of the left kidney similar to the prior ultrasound and most likely representing a dromedary hump. Bladder: Appears normal for degree of bladder distention. Fatty infiltration of the liver. IMPRESSION: No hydronephrosis or echogenic stone. Mildly echogenic kidneys likely related to chronic kidney disease. Focal prominence of the lateral interpolar aspect of the left kidney similar to the prior ultrasound and likely representing a dromedary hump. Fatty liver. Electronically Signed   By: Anner Crete M.D.   On: 06/15/2016 21:26     Labs:   Basic Metabolic Panel:  Recent Labs Lab 06/15/16 1526 06/16/16 0441 06/17/16 0230 06/18/16 0619 06/19/16 0455  NA 130* 129* 130* 134* 135  K 4.1 4.4 4.8 4.5 5.0  CL 96* 98* 99* 100* 100*  CO2 23 21* 20* 22 25  GLUCOSE 166* 176* 149* 152* 158*  BUN 64* 62* 68* 72* 79*  CREATININE 4.02* 3.86* 3.34* 3.14* 3.03*  CALCIUM 11.4* 11.1* 11.2* 11.4* 11.5*  PHOS  --   --  3.6  --   --    GFR Estimated Creatinine Clearance: 16.5 mL/min (by  C-G formula based on SCr of 3.03 mg/dL (H)). Liver Function Tests:  Recent Labs Lab 06/17/16 0230  ALBUMIN 2.9*   No results for input(s): LIPASE, AMYLASE in the last 168 hours. No results for input(s): AMMONIA in the last 168 hours. Coagulation profile No results for input(s): INR, PROTIME in the last 168 hours.  CBC:  Recent Labs Lab 06/15/16 1526 06/16/16 0441  WBC 17.7* 13.9*  NEUTROABS 15.1*  --   HGB 8.4* 9.2*  HCT 24.2* 27.1*  MCV 85.5 86.3  PLT 281 242   Cardiac Enzymes: No results for input(s): CKTOTAL, CKMB, CKMBINDEX,  TROPONINI in the last 168 hours. BNP: Invalid input(s): POCBNP CBG: No results for input(s): GLUCAP in the last 168 hours. D-Dimer No results for input(s): DDIMER in the last 72 hours. Hgb A1c No results for input(s): HGBA1C in the last 72 hours. Lipid Profile No results for input(s): CHOL, HDL, LDLCALC, TRIG, CHOLHDL, LDLDIRECT in the last 72 hours. Thyroid function studies No results for input(s): TSH, T4TOTAL, T3FREE, THYROIDAB in the last 72 hours.  Invalid input(s): FREET3 Anemia work up No results for input(s): VITAMINB12, FOLATE, FERRITIN, TIBC, IRON, RETICCTPCT in the last 72 hours. Microbiology No results found for this or any previous visit (from the past 240 hour(s)).   Discharge Instructions:   Discharge Instructions    Call MD for:  difficulty breathing, headache or visual disturbances    Complete by:  As directed    Call MD for:  extreme fatigue    Complete by:  As directed    Call MD for:  persistant dizziness or light-headedness    Complete by:  As directed    Diet - low sodium heart healthy    Complete by:  As directed    For home use only DME 3 n 1    Complete by:  As directed    For home use only DME 4 wheeled rolling walker with seat    Complete by:  As directed    Patient needs a walker to treat with the following condition:  Physical deconditioning   Increase activity slowly    Complete by:  As directed      Allergies as of 06/19/2016      Reactions   Peanuts [peanut Oil] Swelling      Medication List    STOP taking these medications   furosemide 80 MG tablet Commonly known as:  LASIX   losartan-hydrochlorothiazide 100-12.5 MG tablet Commonly known as:  HYZAAR   potassium chloride 10 MEQ tablet Commonly known as:  K-DUR,KLOR-CON     TAKE these medications   aspirin 81 MG tablet Take 81 mg by mouth at bedtime.   COLCRYS 0.6 MG tablet Generic drug:  colchicine Take 1 tablet by mouth daily as needed.   diltiazem 240 MG 24 hr  capsule Commonly known as:  CARDIZEM CD Take 240 mg by mouth daily.   febuxostat 40 MG tablet Commonly known as:  ULORIC Take 40 mg by mouth daily.   feeding supplement (ENSURE ENLIVE) Liqd Take 237 mLs by mouth 2 (two) times daily between meals.   fluticasone 50 MCG/ACT nasal spray Commonly known as:  FLONASE Place 2 sprays into both nostrils daily as needed.   loratadine 10 MG tablet Commonly known as:  CLARITIN Take 10 mg by mouth daily as needed for allergies.   omeprazole 20 MG capsule Commonly known as:  PRILOSEC Take 30-60 min before first meal of the day   OVER THE  COUNTER MEDICATION Tumeric Curcumin 500mg  daily at bedtime   predniSONE 10 MG (21) Tbpk tablet Commonly known as:  STERAPRED UNI-PAK 21 TAB Take as directed on package.   PROAIR HFA 108 (90 Base) MCG/ACT inhaler Generic drug:  albuterol Inhale 2 puffs into the lungs every 4 (four) hours as needed for wheezing or shortness of breath.   SYMBICORT 160-4.5 MCG/ACT inhaler Generic drug:  budesonide-formoterol inhale 2 puffs by mouth twice daily            Durable Medical Equipment        Start     Ordered   06/19/16 0000  For home use only DME 4 wheeled rolling walker with seat    Question:  Patient needs a walker to treat with the following condition  Answer:  Physical deconditioning   06/19/16 0957   06/19/16 0000  For home use only DME 3 n 1     06/19/16 0957     Follow-up Information    Philis Fendt, MD.   Specialty:  Internal Medicine Why:  Appointment Date: 07/04/2015 @12 :15p  Contact information: [place] [id number] [phone]        [facility].   Specialty:  General Practice Why:  Appointment Date: Please call office to register as a new patient before a scheduled appt can be made. Thank you.  Contact information: [place] [address] [phone]            Time coordinating discharge: 35  minutes.  Signed:  Keyton Bhat  Pager [phone] [facility] 06/19/2016, 6:15 PM

## 2016-06-19 NOTE — Care Management Note (Addendum)
Case Management Note  Patient Details  Name: Mallory Chavez MRN: [id number] Date of Birth: Feb 04, 1936  Subjective/Objective:       CM following for progression and d/c planning.              Action/Plan: 06/19/2016 Met with pt who is declining 3:1 as she has a handicap apartment and does not feel that she needs this. Rollator ordered for home use, will be delivered to room. Pt offered choices and selected [facility] for [facility] services. [place] notified, however they are unable to provide this service due to staffing, [facility] notified.  Pt daughter will pick up pt , no further needs identified. Per pt and daughter this pt does not have a CPAP machine , Dr Rama notified and states that the pt does not require a CPAP at this time.   Expected Discharge Date:       06/19/2016           Expected Discharge Plan:  [place]  In-House Referral:  NA  Discharge planning Services  CM Consult  Post Acute Care Choice:  Home Health, Durable Medical Equipment Choice offered to:  Patient  DME Arranged:  Walker rolling with seat DME Agency:  [place]:  [place] and respiratory therapy P[place] Agency:  [facility].  Status of Service:  Completed, signed off  If discussed at [place] of Stay Meetings, dates discussed:    Additional Comments:  Adron Bene, RN 06/19/2016, 10:52 AM

## 2016-06-19 NOTE — Care Management Important Message (Signed)
Important Message  Patient Details  Name: Mallory Chavez MRN: [id number] Date of Birth: 05/02/1936   Medicare Important Message Given:  Yes    [name] 06/19/2016, 11:39 AM

## 2016-07-02 ENCOUNTER — Emergency Department (HOSPITAL_COMMUNITY): Payer: Medicare Other

## 2016-07-02 ENCOUNTER — Encounter (HOSPITAL_COMMUNITY): Payer: Self-pay | Admitting: Emergency Medicine

## 2016-07-02 ENCOUNTER — Observation Stay (HOSPITAL_COMMUNITY)
Admission: EM | Admit: 2016-07-02 | Discharge: 2016-07-04 | Disposition: A | Discharge status: Home / Self Care | Payer: Medicare Other | Attending: Internal Medicine | Admitting: Internal Medicine

## 2016-07-02 DIAGNOSIS — J449 Chronic obstructive pulmonary disease, unspecified: Secondary | ICD-10-CM | POA: Insufficient documentation

## 2016-07-02 DIAGNOSIS — I130 Hypertensive heart and chronic kidney disease with heart failure and stage 1 through stage 4 chronic kidney disease, or unspecified chronic kidney disease: Secondary | ICD-10-CM | POA: Insufficient documentation

## 2016-07-02 DIAGNOSIS — N189 Chronic kidney disease, unspecified: Secondary | ICD-10-CM | POA: Insufficient documentation

## 2016-07-02 DIAGNOSIS — R109 Unspecified abdominal pain: Secondary | ICD-10-CM | POA: Diagnosis present

## 2016-07-02 DIAGNOSIS — N184 Chronic kidney disease, stage 4 (severe): Secondary | ICD-10-CM | POA: Diagnosis present

## 2016-07-02 DIAGNOSIS — R1011 Right upper quadrant pain: Secondary | ICD-10-CM | POA: Diagnosis not present

## 2016-07-02 DIAGNOSIS — N289 Disorder of kidney and ureter, unspecified: Secondary | ICD-10-CM

## 2016-07-02 DIAGNOSIS — Z7982 Long term (current) use of aspirin: Secondary | ICD-10-CM | POA: Insufficient documentation

## 2016-07-02 DIAGNOSIS — K5790 Diverticulosis of intestine, part unspecified, without perforation or abscess without bleeding: Secondary | ICD-10-CM | POA: Diagnosis present

## 2016-07-02 DIAGNOSIS — J9611 Chronic respiratory failure with hypoxia: Secondary | ICD-10-CM | POA: Diagnosis not present

## 2016-07-02 DIAGNOSIS — R110 Nausea: Secondary | ICD-10-CM | POA: Diagnosis present

## 2016-07-02 DIAGNOSIS — Z91010 Allergy to peanuts: Secondary | ICD-10-CM | POA: Diagnosis not present

## 2016-07-02 DIAGNOSIS — N281 Cyst of kidney, acquired: Secondary | ICD-10-CM

## 2016-07-02 DIAGNOSIS — I5032 Chronic diastolic (congestive) heart failure: Secondary | ICD-10-CM | POA: Diagnosis present

## 2016-07-02 DIAGNOSIS — Z87891 Personal history of nicotine dependence: Secondary | ICD-10-CM | POA: Insufficient documentation

## 2016-07-02 DIAGNOSIS — Z79899 Other long term (current) drug therapy: Secondary | ICD-10-CM | POA: Diagnosis not present

## 2016-07-02 DIAGNOSIS — E871 Hypo-osmolality and hyponatremia: Secondary | ICD-10-CM | POA: Diagnosis present

## 2016-07-02 DIAGNOSIS — N179 Acute kidney failure, unspecified: Secondary | ICD-10-CM | POA: Diagnosis not present

## 2016-07-02 DIAGNOSIS — R7989 Other specified abnormal findings of blood chemistry: Secondary | ICD-10-CM | POA: Diagnosis present

## 2016-07-02 DIAGNOSIS — I10 Essential (primary) hypertension: Secondary | ICD-10-CM | POA: Diagnosis present

## 2016-07-02 DIAGNOSIS — R5381 Other malaise: Secondary | ICD-10-CM | POA: Diagnosis present

## 2016-07-02 DIAGNOSIS — D649 Anemia, unspecified: Secondary | ICD-10-CM | POA: Diagnosis present

## 2016-07-02 DIAGNOSIS — R945 Abnormal results of liver function studies: Secondary | ICD-10-CM

## 2016-07-02 DIAGNOSIS — N186 End stage renal disease: Secondary | ICD-10-CM

## 2016-07-02 HISTORY — DX: Hypo-osmolality and hyponatremia: E87.1

## 2016-07-02 HISTORY — DX: Chronic respiratory failure, unspecified whether with hypoxia or hypercapnia: J96.10

## 2016-07-02 HISTORY — DX: Hyperglycemia, unspecified: R73.9

## 2016-07-02 HISTORY — DX: Chronic kidney disease, unspecified: N18.9

## 2016-07-02 LAB — URINALYSIS, ROUTINE W REFLEX MICROSCOPIC
BACTERIA UA: NONE SEEN
BILIRUBIN URINE: NEGATIVE
Glucose, UA: NEGATIVE mg/dL
Hgb urine dipstick: NEGATIVE
KETONES UR: NEGATIVE mg/dL
NITRITE: NEGATIVE
PH: 6 (ref 5.0–8.0)
Protein, ur: 30 mg/dL — AB
Specific Gravity, Urine: 1.01 (ref 1.005–1.030)

## 2016-07-02 LAB — CBC WITH DIFFERENTIAL/PLATELET
BASOS ABS: 0 10*3/uL (ref 0.0–0.1)
Basophils Relative: 0 %
EOS PCT: 0 %
Eosinophils Absolute: 0 10*3/uL (ref 0.0–0.7)
HCT: 31.3 % — ABNORMAL LOW (ref 36.0–46.0)
HEMOGLOBIN: 11.1 g/dL — AB (ref 12.0–15.0)
LYMPHS PCT: 13 %
Lymphs Abs: 1.2 10*3/uL (ref 0.7–4.0)
MCH: 29.9 pg (ref 26.0–34.0)
MCHC: 35.5 g/dL (ref 30.0–36.0)
MCV: 84.4 fL (ref 78.0–100.0)
Monocytes Absolute: 0.4 10*3/uL (ref 0.1–1.0)
Monocytes Relative: 5 %
NEUTROS ABS: 7.3 10*3/uL (ref 1.7–7.7)
NEUTROS PCT: 82 %
PLATELETS: 226 10*3/uL (ref 150–400)
RBC: 3.71 MIL/uL — AB (ref 3.87–5.11)
RDW: 13.7 % (ref 11.5–15.5)
WBC: 8.9 10*3/uL (ref 4.0–10.5)

## 2016-07-02 LAB — COMPREHENSIVE METABOLIC PANEL
ALT: 92 U/L — ABNORMAL HIGH (ref 14–54)
AST: 45 U/L — ABNORMAL HIGH (ref 15–41)
Albumin: 3.6 g/dL (ref 3.5–5.0)
Alkaline Phosphatase: 46 U/L (ref 38–126)
Anion gap: 9 (ref 5–15)
BILIRUBIN TOTAL: 0.8 mg/dL (ref 0.3–1.2)
BUN: 88 mg/dL — ABNORMAL HIGH (ref 6–20)
CO2: 27 mmol/L (ref 22–32)
Calcium: 11.1 mg/dL — ABNORMAL HIGH (ref 8.9–10.3)
Chloride: 89 mmol/L — ABNORMAL LOW (ref 101–111)
Creatinine, Ser: 4.16 mg/dL — ABNORMAL HIGH (ref 0.44–1.00)
GFR, EST AFRICAN AMERICAN: 11 mL/min — AB (ref >60)
GFR, EST NON AFRICAN AMERICAN: 9 mL/min — AB (ref >60)
Glucose, Bld: 119 mg/dL — ABNORMAL HIGH (ref 65–99)
POTASSIUM: 4.4 mmol/L (ref 3.5–5.1)
Sodium: 125 mmol/L — ABNORMAL LOW (ref 135–145)
TOTAL PROTEIN: 7.5 g/dL (ref 6.5–8.1)

## 2016-07-02 LAB — SODIUM, URINE, RANDOM: Sodium, Ur: 24 mmol/L

## 2016-07-02 LAB — OSMOLALITY: OSMOLALITY: 294 mosm/kg (ref 275–295)

## 2016-07-02 LAB — LIPASE, BLOOD: Lipase: 95 U/L — ABNORMAL HIGH (ref 11–51)

## 2016-07-02 LAB — OSMOLALITY, URINE: OSMOLALITY UR: 341 mosm/kg (ref 300–900)

## 2016-07-02 MED ORDER — SODIUM CHLORIDE 0.9 % IV SOLN
INTRAVENOUS | Status: AC
  Administered 2016-07-02: 17:00:00 via INTRAVENOUS

## 2016-07-02 MED ORDER — MOMETASONE FURO-FORMOTEROL FUM 200-5 MCG/ACT IN AERO
2.0000 | INHALATION_SPRAY | Freq: Two times a day (BID) | RESPIRATORY_TRACT | Status: DC
  Administered 2016-07-03 – 2016-07-04 (×3): 2 via RESPIRATORY_TRACT
  Filled 2016-07-02 (×2): qty 8.8

## 2016-07-02 MED ORDER — FLUTICASONE PROPIONATE 50 MCG/ACT NA SUSP
2.0000 | Freq: Every day | NASAL | Status: DC | PRN
  Filled 2016-07-02: qty 16

## 2016-07-02 MED ORDER — TRAZODONE HCL 50 MG PO TABS: 25.0000 mg | ORAL_TABLET | Freq: Every evening | ORAL | Status: DC | PRN

## 2016-07-02 MED ORDER — ONDANSETRON HCL 4 MG/2ML IJ SOLN
4.0000 mg | Freq: Once | INTRAMUSCULAR | Status: AC
  Administered 2016-07-02: 4 mg via INTRAVENOUS
  Filled 2016-07-02: qty 2

## 2016-07-02 MED ORDER — SODIUM CHLORIDE 0.9 % IV BOLUS (SEPSIS)
500.0000 mL | Freq: Once | INTRAVENOUS | Status: AC
  Administered 2016-07-02: 500 mL via INTRAVENOUS

## 2016-07-02 MED ORDER — ACETAMINOPHEN 325 MG PO TABS: 650.0000 mg | ORAL_TABLET | Freq: Four times a day (QID) | ORAL | Status: DC | PRN

## 2016-07-02 MED ORDER — LORATADINE 10 MG PO TABS
10.0000 mg | ORAL_TABLET | Freq: Every day | ORAL | Status: DC
  Administered 2016-07-02 – 2016-07-04 (×3): 10 mg via ORAL
  Filled 2016-07-02 (×3): qty 1

## 2016-07-02 MED ORDER — COLCHICINE 0.6 MG PO TABS: 0.6000 mg | ORAL_TABLET | Freq: Every day | ORAL | Status: DC | PRN

## 2016-07-02 MED ORDER — HYDROCODONE-ACETAMINOPHEN 5-325 MG PO TABS: 1.0000 | ORAL_TABLET | ORAL | Status: DC | PRN

## 2016-07-02 MED ORDER — ASPIRIN EC 81 MG PO TBEC
81.0000 mg | DELAYED_RELEASE_TABLET | Freq: Every day | ORAL | Status: DC
  Administered 2016-07-02 – 2016-07-03 (×2): 81 mg via ORAL
  Filled 2016-07-02 (×2): qty 1

## 2016-07-02 MED ORDER — ENSURE ENLIVE PO LIQD
237.0000 mL | Freq: Two times a day (BID) | ORAL | Status: DC
  Administered 2016-07-03 – 2016-07-04 (×3): 237 mL via ORAL

## 2016-07-02 MED ORDER — ALBUTEROL SULFATE (2.5 MG/3ML) 0.083% IN NEBU: 2.5000 mg | INHALATION_SOLUTION | RESPIRATORY_TRACT | Status: DC | PRN

## 2016-07-02 MED ORDER — HEPARIN SODIUM (PORCINE) 5000 UNIT/ML IJ SOLN
5000.0000 [IU] | Freq: Three times a day (TID) | INTRAMUSCULAR | Status: DC
  Administered 2016-07-02 – 2016-07-04 (×5): 5000 [IU] via SUBCUTANEOUS
  Filled 2016-07-02 (×5): qty 1

## 2016-07-02 MED ORDER — PANTOPRAZOLE SODIUM 40 MG PO TBEC
40.0000 mg | DELAYED_RELEASE_TABLET | Freq: Every day | ORAL | Status: DC
  Administered 2016-07-02 – 2016-07-04 (×3): 40 mg via ORAL
  Filled 2016-07-02 (×3): qty 1

## 2016-07-02 MED ORDER — DILTIAZEM HCL ER COATED BEADS 360 MG PO CP24
360.0000 mg | ORAL_CAPSULE | Freq: Every day | ORAL | Status: DC
  Administered 2016-07-02 – 2016-07-04 (×3): 360 mg via ORAL
  Filled 2016-07-02: qty 1
  Filled 2016-07-02: qty 2
  Filled 2016-07-02 (×2): qty 1
  Filled 2016-07-02 (×2): qty 2

## 2016-07-02 MED ORDER — FAMOTIDINE IN NACL 20-0.9 MG/50ML-% IV SOLN
20.0000 mg | Freq: Once | INTRAVENOUS | Status: AC
  Administered 2016-07-02: 20 mg via INTRAVENOUS
  Filled 2016-07-02: qty 50

## 2016-07-02 MED ORDER — SENNOSIDES-DOCUSATE SODIUM 8.6-50 MG PO TABS: 1.0000 | ORAL_TABLET | Freq: Every evening | ORAL | Status: DC | PRN

## 2016-07-02 MED ORDER — FEBUXOSTAT 40 MG PO TABS
40.0000 mg | ORAL_TABLET | Freq: Every day | ORAL | Status: DC
  Administered 2016-07-02 – 2016-07-04 (×3): 40 mg via ORAL
  Filled 2016-07-02 (×3): qty 1

## 2016-07-02 MED ORDER — ONDANSETRON HCL 4 MG/2ML IJ SOLN
4.0000 mg | INTRAMUSCULAR | Status: AC
  Administered 2016-07-02 – 2016-07-03 (×3): 4 mg via INTRAVENOUS
  Filled 2016-07-02 (×4): qty 2

## 2016-07-02 MED ORDER — ACETAMINOPHEN 650 MG RE SUPP: 650.0000 mg | Freq: Four times a day (QID) | RECTAL | Status: DC | PRN

## 2016-07-02 NOTE — ED Triage Notes (Signed)
Per EMS: pt from home c/o decreased appetite x 1 week with some nausea

## 2016-07-02 NOTE — ED Provider Notes (Signed)
[facility] DEPT Provider Note   CSN: [id number] Arrival date & time: 07/02/16  6010     History   Chief Complaint Chief Complaint  Patient presents with  . Nausea    HPI Mallory Chavez is a 81 y.o. female.  Patient is a 81 year old female who presents with nausea. She states over last 2-3 days she's had decreased appetite and a nausea feeling. She denies any associated abdominal pain. No shortness of breath. She has a little bit of nasal congestion and cough. No leg swelling. She was recently admitted from December 15 to December 19 for a COPD exacerbation and acute renal failure. She denies any fevers. No urinary symptoms. She's having normal bowel movements. She denies any chest pain or shortness of breath.      Past Medical History:  Diagnosis Date  . Acute renal failure (ARF) ([place]) 06/15/2016  . Allergy   . Arthritis   . Asthma   . CHF (congestive heart failure) ([place]) 1980   controlled with meds  . Chronic respiratory failure ([place])   . CKD (chronic kidney disease)   . GERD (gastroesophageal reflux disease)   . Hyperglycemia   . Hyperlipidemia   . Hypertension   . Hyponatremia     Patient Active Problem List   Diagnosis Date Noted  . Abdominal pain 07/02/2016  . Nausea 07/02/2016  . Elevated LFTs 07/02/2016  . Acute renal failure superimposed on stage 1 chronic kidney disease ([place]) 07/02/2016  . Kidney lesion 07/02/2016  . Diverticulosis 07/02/2016  . Prediabetes 06/18/2016  . Acute on chronic respiratory failure ([place]) 06/16/2016  . Physical deconditioning 06/16/2016  . Normocytic anemia 06/16/2016  . Hyperglycemia 06/16/2016  . Hyponatremia 06/16/2016  . Acute renal failure (ARF) ([place]) 06/15/2016  . COPD exacerbation ([place]) 06/15/2016  . Chronic diastolic congestive heart failure ([place]) 06/15/2016  . Morbid obesity due to excess calories ([place]) 01/17/2015  . Chronic respiratory failure with hypoxia ([place]) 04/26/2014  . Exercise hypoxemia 04/19/2014    . COPD GOLD II 11/10/2012  . Essential hypertension 10/03/2012  . SOB (shortness of breath) 10/01/2012    Past Surgical History:  Procedure Laterality Date  . [place]  . TUMOR REMOVAL     lower back/benign and has come back  . VAGINAL HYSTERECTOMY      OB History    No data available       Home Medications    Prior to Admission medications   Medication Sig Start Date End Date Taking? Authorizing Provider  aspirin 81 MG tablet Take 81 mg by mouth at bedtime.    Yes Historical Provider, MD  budesonide-formoterol [facility]) 160-4.5 MCG/ACT inhaler inhale 2 puffs by mouth twice daily 02/03/14  Yes Tanda Rockers, MD  COLCRYS 0.6 MG tablet Take 1 tablet by mouth daily as needed (gout).  07/06/13  Yes Historical Provider, MD  diltiazem (CARDIZEM CD) 360 MG 24 hr capsule Take 360 mg by mouth daily. 06/18/16  Yes Historical Provider, MD  febuxostat (ULORIC) 40 MG tablet Take 40 mg by mouth daily.   Yes Historical Provider, MD  feeding supplement, ENSURE ENLIVE, (ENSURE ENLIVE) LIQD Take 237 mLs by mouth 2 (two) times daily between meals. 06/19/16  Yes Christina P Rama, MD  fluticasone (FLONASE) 50 MCG/ACT nasal spray Place 2 sprays into both nostrils daily as needed for allergies.  06/04/14  Yes Historical Provider, MD  furosemide (LASIX) 80 MG tablet Take 80 mg by mouth daily. 06/18/16  Yes Historical Provider,  MD  loratadine (CLARITIN) 10 MG tablet Take 10 mg by mouth daily.    Yes Historical Provider, MD  omeprazole (PRILOSEC) 20 MG capsule Take 30-60 min before first meal of the day 01/16/13  Yes Tanda Rockers, MD  PROAIR HFA [phone] BASE) MCG/ACT inhaler Inhale 2 puffs into the lungs every 4 (four) hours as needed for wheezing or shortness of breath.  09/24/12  Yes Historical Provider, MD  Red Yeast Rice Extract (RED YEAST RICE PO) Take 1 capsule by mouth every other day.   Yes Historical Provider, MD  Turmeric 500 MG TABS Take 500 mg by mouth daily.   Yes  Historical Provider, MD    Family History Family History  Problem Relation Age of Onset  . Stroke Mother   . Diabetes Father     Social History Social History  Substance Use Topics  . Smoking status: Former Smoker    Packs/day: 1.50    Years: 20.00    Types: Cigarettes    Quit date: 07/02/1992  . Smokeless tobacco: Never Used  . Alcohol use No     Allergies   Peanuts [peanut oil]   Review of Systems Review of Systems  Constitutional: Positive for appetite change. Negative for chills, diaphoresis, fatigue and fever.  HENT: Negative for congestion, rhinorrhea and sneezing.   Eyes: Negative.   Respiratory: Negative for cough, chest tightness and shortness of breath.   Cardiovascular: Negative for chest pain and leg swelling.  Gastrointestinal: Positive for nausea. Negative for abdominal pain, blood in stool, diarrhea and vomiting.  Genitourinary: Negative for difficulty urinating, flank pain, frequency and hematuria.  Musculoskeletal: Negative for arthralgias and back pain.  Skin: Negative for rash.  Neurological: Negative for dizziness, speech difficulty, weakness, numbness and headaches.     Physical Exam Updated Vital Signs BP 145/82 (BP Location: Right Arm)   Pulse 80   Temp 97.8 F (36.6 C) (Oral)   Resp 18   SpO2 98%   Physical Exam  Constitutional: She is oriented to person, place, and time. She appears well-developed and well-nourished.  HENT:  Head: Normocephalic and atraumatic.  Eyes: Pupils are equal, round, and reactive to light.  Neck: Normal range of motion. Neck supple.  Cardiovascular: Normal rate, regular rhythm and normal heart sounds.   Pulmonary/Chest: Effort normal and breath sounds normal. No respiratory distress. She has no wheezes. She has no rales. She exhibits no tenderness.  Abdominal: Soft. Bowel sounds are normal. There is tenderness (Moderate tenderness to the epigastrium and right upper quadrant). There is no rebound and no  guarding.  Musculoskeletal: Normal range of motion. She exhibits no edema.  Lymphadenopathy:    She has no cervical adenopathy.  Neurological: She is alert and oriented to person, place, and time.  Skin: Skin is warm and dry. No rash noted.  Psychiatric: She has a normal mood and affect.     ED Treatments / Results  Labs (all labs ordered are listed, but only abnormal results are displayed) Labs Reviewed  COMPREHENSIVE METABOLIC PANEL - Abnormal; Notable for the following:       Result Value   Sodium 125 (*)    Chloride 89 (*)    Glucose, Bld 119 (*)    BUN 88 (*)    Creatinine, Ser 4.16 (*)    Calcium 11.1 (*)    AST 45 (*)    ALT 92 (*)    GFR calc non Af Amer 9 (*)    GFR calc Af  Amer 11 (*)    All other components within normal limits  CBC WITH DIFFERENTIAL/PLATELET - Abnormal; Notable for the following:    RBC 3.71 (*)    Hemoglobin 11.1 (*)    HCT 31.3 (*)    All other components within normal limits  LIPASE, BLOOD - Abnormal; Notable for the following:    Lipase 95 (*)    All other components within normal limits  URINALYSIS, ROUTINE W REFLEX MICROSCOPIC - Abnormal; Notable for the following:    Protein, ur 30 (*)    Leukocytes, UA SMALL (*)    Squamous Epithelial / LPF 0-5 (*)    All other components within normal limits  CREATININE, SERUM  SODIUM, URINE, RANDOM  OSMOLALITY, URINE  OSMOLALITY    EKG  EKG Interpretation None       Radiology Ct Abdomen Pelvis Wo Contrast  Result Date: 07/02/2016 CLINICAL DATA:  Patient with renal insufficiency. Upper abdominal pain. EXAM: CT ABDOMEN AND PELVIS WITHOUT CONTRAST TECHNIQUE: Multidetector CT imaging of the abdomen and pelvis was performed following the standard protocol without IV contrast. COMPARISON:  None. FINDINGS: Lower chest: Heart is mildly enlarged. Trace pericardial fluid. Bilateral lower lung bandlike consolidative opacities. No pleural effusion. Hepatobiliary: Liver is normal in size and contour.  Liver is decreased in attenuation. Gallbladder is unremarkable. No intrahepatic or extrahepatic biliary ductal dilatation. Pancreas: Unremarkable Spleen: Unremarkable Adrenals/Urinary Tract: 1.6 cm indeterminate left adrenal nodule (image 23; series 201). Right adrenal gland is normal. The kidneys are lobular in contour bilaterally. There is a 4.7 cm simple cyst exophytic off the inferior pole of the right kidney. Additional smaller exophytic cysts off the interpolar region of the right kidney and within the superior pole of the right kidney. 3 mm stone inferior pole right kidney. Within the inferior pole of the left kidney there are 3 adjacent lesions measuring up to 1.5 cm with an internal density in the 45-50 Hounsfield range. Urinary bladder is unremarkable. Stomach/Bowel: Pancolonic diverticulosis. No CT evidence for acute diverticulitis. Appendix is normal. No abnormal bowel wall thickening or evidence for bowel obstruction. The stomach is normal morphology. No free fluid or free intraperitoneal air. Vascular/Lymphatic: Peripheral calcified atherosclerotic plaque. No retroperitoneal lymphadenopathy. Reproductive: Patient status post hysterectomy. There is a 3.1 x 2.7 cm right adnexal cystic lesion. Other: Diastases of the rectus abdominus musculature. Musculoskeletal: Lower thoracic and lumbar spine degenerative changes. No aggressive or acute appearing osseous lesions. IMPRESSION: No acute process within the abdomen or pelvis. Three adjacent indeterminate intermediate density lesions within the inferior pole of the left kidney. These may represent hemorrhagic cysts however solid lesions such as renal cell carcinomas cannot be excluded. These need dedicated evaluation with pre and post contrast-enhanced MRI in the non acute setting. Indeterminate left adrenal nodule. This can be characterized on above recommended MRI. 3.1 cm adnexal cystic lesion. Recommend pelvic ultrasound in the non acute setting. Aortic  atherosclerosis. Pancolonic diverticulosis without evidence for acute diverticulitis. Hepatic steatosis. These results will be called to the ordering clinician or representative by the Radiologist Assistant, and communication documented in the PACS or zVision Dashboard. Electronically Signed   By: Lovey Newcomer M.D.   On: 07/02/2016 13:09   Dg Chest 2 View  Result Date: 07/02/2016 CLINICAL DATA:  Non smoker, htn. No previous chest hx per pt. Pt reports cough and abdominal pain with some nausea and dry heaving since yesterday. EXAM: CHEST  2 VIEW COMPARISON:  06/15/2016 FINDINGS: The heart is enlarged. There are no focal consolidations  or pleural effusions. No pulmonary edema. IMPRESSION: Stable cardiomegaly. Electronically Signed   By: Nolon Nations M.D.   On: 07/02/2016 10:12   US Abdomen Limited Ruq  Result Date: 07/02/2016 CLINICAL DATA:  Patient with right upper quadrant pain. Decreased appetite. Nausea. EXAM: US ABDOMEN LIMITED - RIGHT UPPER QUADRANT COMPARISON:  Renal ultrasound 06/15/2016. FINDINGS: Gallbladder: No gallstones or wall thickening visualized. No sonographic Murphy sign noted by sonographer. Common bile duct: Diameter: 4 mm Liver: Diffusely increased in echogenicity.  No focal lesion identified. Incidental note is made of a right renal cyst. IMPRESSION: No cholelithiasis or sonographic evidence for acute cholecystitis. Hepatic steatosis. Electronically Signed   By: Lovey Newcomer M.D.   On: 07/02/2016 11:35    Procedures Procedures (including critical care time)  Medications Ordered in ED Medications  diltiazem (CARDIZEM CD) 24 hr capsule 360 mg (not administered)  feeding supplement (ENSURE ENLIVE) (ENSURE ENLIVE) liquid 237 mL (not administered)  febuxostat (ULORIC) tablet 40 mg (not administered)  loratadine (CLARITIN) tablet 10 mg (not administered)  fluticasone (FLONASE) 50 MCG/ACT nasal spray 2 spray (not administered)  mometasone-formoterol (DULERA) 200-5 MCG/ACT inhaler 2  puff (not administered)  colchicine tablet 0.6 mg (not administered)  aspirin tablet 81 mg (not administered)  pantoprazole (PROTONIX) EC tablet 40 mg (not administered)  albuterol (PROVENTIL HFA;VENTOLIN HFA) 108 (90 Base) MCG/ACT inhaler 2 puff (not administered)  heparin injection 5,000 Units (not administered)  0.9 %  sodium chloride infusion (not administered)  acetaminophen (TYLENOL) tablet 650 mg (not administered)    Or  acetaminophen (TYLENOL) suppository 650 mg (not administered)  HYDROcodone-acetaminophen (NORCO/VICODIN) 5-325 MG per tablet 1-2 tablet (not administered)  traZODone (DESYREL) tablet 25 mg (not administered)  senna-docusate (Senokot-S) tablet 1 tablet (not administered)  ondansetron (ZOFRAN) injection 4 mg (not administered)  famotidine (PEPCID) IVPB 20 mg premix (0 mg Intravenous Stopped 07/02/16 1044)  ondansetron (ZOFRAN) injection 4 mg (4 mg Intravenous Given 07/02/16 1014)  sodium chloride 0.9 % bolus 500 mL (0 mLs Intravenous Stopped 07/02/16 1247)     Initial Impression / Assessment and Plan / ED Course  I have reviewed the triage vital signs and the nursing notes.  Pertinent labs & imaging results that were available during my care of the patient were reviewed by me and considered in my medical decision making (see chart for details).  Clinical Course     Patient presents with nausea and decreased appetite. She has evidence of hyponatremia on exam. Her renal function has worsened since her last admission. Her BUN is elevated. She has a mild elevation of her LFTs and lipase. However her gallbladder ultrasound is negative. I performed a CT of her abdomen and pelvis which showed no obvious mass or other abnormality. There is however some kidney lesions and an adrenal lesion that we'll need outpatient follow-up. Given her hyponatremia with worsening renal function and poor appetite, I spoke with the mid-level provider with the hospitalist service who is accepted  patient for admission for observation and hydration.  Final Clinical Impressions(s) / ED Diagnoses   Final diagnoses:  RUQ pain  Hyponatremia  AKI (acute kidney injury) ([place])    New Prescriptions New Prescriptions   No medications on file     Malvin Johns, MD 07/02/16 1454

## 2016-07-02 NOTE — Progress Notes (Signed)
Pt has home medications in room. Refused for them to be taken to pharmacy. Place home medications in a bag and left them in the pt's room closet.    Called daughter and left a voicemail stating that she needed to pick them up tomorrow.  Passed this info. to the oncoming RN .   Paulla Fore, RN.

## 2016-07-02 NOTE — ED Notes (Signed)
Patient transported to Ultrasound 

## 2016-07-02 NOTE — Progress Notes (Addendum)
New Admission Note:   Arrival Method: stretcher  Mental Orientation: Alert and orientated x4  Telemetry:no  Assessment: Completed Skin:  Intact/dry  Iv: peripheral IV #22G Right AC  Pain: Denies  Tubes:none  Safety Measures: Call light and phone within reach. Pt educated to call before getting up.  Admission: In progress  6 [place] Orientation: Patient has been orientated to the room, unit and staff.  Family: Daughter at bedside.   Orders have been reviewed and implemented. Will continue to monitor the patient. Call light has been placed within reach and bed alarm has been activated.   Emilio Math, RN [facility]  Phone number: [phone]

## 2016-07-02 NOTE — H&P (Signed)
History and Physical    Mallory Chavez KXF:[id number] DOB: 07-Dec-1935 DOA: 07/02/2016  PCP: Philis Fendt, MD Patient coming from: home  Chief Complaint: generalized weakness/decreased oral intake/nausea  HPI: Mallory Chavez is a very pleasant 81 y.o. female with medical history significant for COPD on oxygen at night only, hypertension, GERD, diastolic heart failure presents to the emergency department chief complaint of generalized weakness and intermittent nausea. Initial evaluation reveals hyponatremia mildly elevated LFTs acute on chronic kidney disease.  Information is obtained from the patient. She states she was discharged approximately 3 weeks ago after 4 day hospitalization for acute on chronic respiratory failure related to COPD exacerbation. She said initially she did well but then "causing it any better". She reports decreased oral intake as every time she ate "it made me sick to my stomach". She denies any emesis but does admit to some dry heaving this morning. She also reports decreased appetite stating "I'm just not interested in food". She also reports compliance with her medications. She denies headache visual disturbances or chills chest pain palpitation shortness of breath or cough. She denies lower extremity edema abdominal pain diarrhea constipation melena. She denies dysuria hematuria frequency or urgency.    ED Course: In the emergency department she is afebrile hemodynamically stable and not hypoxic. She is provided with 20 mg of Pepcid IV 4 mg of Zofran IV and a 500 mL bolus of normal saline  Review of Systems: As per HPI otherwise 10 point review of systems negative.   Ambulatory Status: She uses a walker reports no recent falls she lives alone is independent with ADLs  Past Medical History:  Diagnosis Date  . Acute renal failure (ARF) ([place]) 06/15/2016  . Allergy   . Arthritis   . Asthma   . CHF (congestive heart failure) ([place]) 1980   controlled with  meds  . Chronic respiratory failure ([place])   . CKD (chronic kidney disease)   . GERD (gastroesophageal reflux disease)   . Hyperglycemia   . Hyperlipidemia   . Hypertension   . Hyponatremia     Past Surgical History:  Procedure Laterality Date  . [place]  . TUMOR REMOVAL     lower back/benign and has come back  . VAGINAL HYSTERECTOMY      Social History   Social History  . Marital status: Divorced    Spouse name: N/A  . Number of children: N/A  . Years of education: N/A   Occupational History  . Not on file.   Social History Main Topics  . Smoking status: Former Smoker    Packs/day: 1.50    Years: 20.00    Types: Cigarettes    Quit date: 07/02/1992  . Smokeless tobacco: Never Used  . Alcohol use No  . Drug use: No  . Sexual activity: Not on file   Other Topics Concern  . Not on file   Social History Narrative  . No narrative on file    Allergies  Allergen Reactions  . Peanuts [Peanut Oil] Swelling    Family History  Problem Relation Age of Onset  . Stroke Mother   . Diabetes Father     Prior to Admission medications   Medication Sig Start Date End Date Taking? Authorizing Provider  aspirin 81 MG tablet Take 81 mg by mouth at bedtime.    Yes Historical Provider, MD  budesonide-formoterol [facility]) 160-4.5 MCG/ACT inhaler inhale 2 puffs by mouth twice daily 02/03/14  Yes Legrand Como  Mckinley Jewel, MD  COLCRYS 0.6 MG tablet Take 1 tablet by mouth daily as needed (gout).  07/06/13  Yes Historical Provider, MD  diltiazem (CARDIZEM CD) 360 MG 24 hr capsule Take 360 mg by mouth daily. 06/18/16  Yes Historical Provider, MD  febuxostat (ULORIC) 40 MG tablet Take 40 mg by mouth daily.   Yes Historical Provider, MD  feeding supplement, ENSURE ENLIVE, (ENSURE ENLIVE) LIQD Take 237 mLs by mouth 2 (two) times daily between meals. 06/19/16  Yes Christina P Rama, MD  fluticasone (FLONASE) 50 MCG/ACT nasal spray Place 2 sprays into both nostrils daily as  needed for allergies.  06/04/14  Yes Historical Provider, MD  furosemide (LASIX) 80 MG tablet Take 80 mg by mouth daily. 06/18/16  Yes Historical Provider, MD  loratadine (CLARITIN) 10 MG tablet Take 10 mg by mouth daily.    Yes Historical Provider, MD  omeprazole (PRILOSEC) 20 MG capsule Take 30-60 min before first meal of the day 01/16/13  Yes Tanda Rockers, MD  PROAIR HFA 108 [phone] BASE) MCG/ACT inhaler Inhale 2 puffs into the lungs every 4 (four) hours as needed for wheezing or shortness of breath.  09/24/12  Yes Historical Provider, MD  Red Yeast Rice Extract (RED YEAST RICE PO) Take 1 capsule by mouth every other day.   Yes Historical Provider, MD  Turmeric 500 MG TABS Take 500 mg by mouth daily.   Yes Historical Provider, MD    Physical Exam: Vitals:   07/02/16 0930 07/02/16 0945 07/02/16 1108 07/02/16 1145  BP:  125/63 138/78 116/91  Pulse: 83 82 80 83  Resp:   13 16  Temp:      TempSrc:      SpO2: 100% 100% 99% 97%     General:  Appears calm and comfortable, somewhat lethargic Eyes:  PERRL, EOMI, normal lids, iris ENT:  grossly normal hearing, lips & tongue, mucous membranes of her mouth are pink but dry Neck:  no LAD, masses or thyromegaly Cardiovascular:  RRR, no m/r/g. No LE edema. Pedal pulses present and palpable Respiratory:  CTA bilaterally, no w/r/r. Normal respiratory effort. Abdomen:  soft, ntnd, obese sluggish bowel sounds no guarding or rebounding Skin:  no rash or induration seen on limited exam Musculoskeletal:  grossly normal tone BUE/BLE, good ROM, no bony abnormality Psychiatric:  grossly normal mood and affect, speech fluent and appropriate, AOx3 Neurologic:  CN 2-12 grossly intact, moves all extremities in coordinated fashion, sensation intact  Labs on Admission: I have personally reviewed following labs and imaging studies  CBC:  Recent Labs Lab 07/02/16 0924  WBC 8.9  NEUTROABS 7.3  HGB 11.1*  HCT 31.3*  MCV 84.4  PLT 614   Basic Metabolic  Panel:  Recent Labs Lab 07/02/16 0924  NA 125*  K 4.4  CL 89*  CO2 27  GLUCOSE 119*  BUN 88*  CREATININE 4.16*  CALCIUM 11.1*   GFR: Estimated Creatinine Clearance: 11.8 mL/min (by C-G formula based on SCr of 4.16 mg/dL (H)). Liver Function Tests:  Recent Labs Lab 07/02/16 0924  AST 45*  ALT 92*  ALKPHOS 46  BILITOT 0.8  PROT 7.5  ALBUMIN 3.6    Recent Labs Lab 07/02/16 0924  LIPASE 95*   No results for input(s): AMMONIA in the last 168 hours. Coagulation Profile: No results for input(s): INR, PROTIME in the last 168 hours. Cardiac Enzymes: No results for input(s): CKTOTAL, CKMB, CKMBINDEX, TROPONINI in the last 168 hours. BNP (last 3 results) No results  for input(s): PROBNP in the last 8760 hours. HbA1C: No results for input(s): HGBA1C in the last 72 hours. CBG: No results for input(s): GLUCAP in the last 168 hours. Lipid Profile: No results for input(s): CHOL, HDL, LDLCALC, TRIG, CHOLHDL, LDLDIRECT in the last 72 hours. Thyroid Function Tests: No results for input(s): TSH, T4TOTAL, FREET4, T3FREE, THYROIDAB in the last 72 hours. Anemia Panel: No results for input(s): VITAMINB12, FOLATE, FERRITIN, TIBC, IRON, RETICCTPCT in the last 72 hours. Urine analysis:    Component Value Date/Time   COLORURINE YELLOW 07/02/2016 1058   APPEARANCEUR CLEAR 07/02/2016 1058   LABSPEC 1.010 07/02/2016 1058   PHURINE 6.0 07/02/2016 1058   GLUCOSEU NEGATIVE 07/02/2016 1058   HGBUR NEGATIVE 07/02/2016 1058   BILIRUBINUR NEGATIVE 07/02/2016 1058   KETONESUR NEGATIVE 07/02/2016 1058   PROTEINUR 30 (A) 07/02/2016 1058   NITRITE NEGATIVE 07/02/2016 1058   LEUKOCYTESUR SMALL (A) 07/02/2016 1058    Creatinine Clearance: Estimated Creatinine Clearance: 11.8 mL/min (by C-G formula based on SCr of 4.16 mg/dL (H)).  Sepsis Labs: @LABRCNTIP (procalcitonin:4,lacticidven:4) )No results found for this or any previous visit (from the past 240 hour(s)).   Radiological Exams on  Admission: Ct Abdomen Pelvis Wo Contrast  Result Date: 07/02/2016 CLINICAL DATA:  Patient with renal insufficiency. Upper abdominal pain. EXAM: CT ABDOMEN AND PELVIS WITHOUT CONTRAST TECHNIQUE: Multidetector CT imaging of the abdomen and pelvis was performed following the standard protocol without IV contrast. COMPARISON:  None. FINDINGS: Lower chest: Heart is mildly enlarged. Trace pericardial fluid. Bilateral lower lung bandlike consolidative opacities. No pleural effusion. Hepatobiliary: Liver is normal in size and contour. Liver is decreased in attenuation. Gallbladder is unremarkable. No intrahepatic or extrahepatic biliary ductal dilatation. Pancreas: Unremarkable Spleen: Unremarkable Adrenals/Urinary Tract: 1.6 cm indeterminate left adrenal nodule (image 23; series 201). Right adrenal gland is normal. The kidneys are lobular in contour bilaterally. There is a 4.7 cm simple cyst exophytic off the inferior pole of the right kidney. Additional smaller exophytic cysts off the interpolar region of the right kidney and within the superior pole of the right kidney. 3 mm stone inferior pole right kidney. Within the inferior pole of the left kidney there are 3 adjacent lesions measuring up to 1.5 cm with an internal density in the 45-50 Hounsfield range. Urinary bladder is unremarkable. Stomach/Bowel: Pancolonic diverticulosis. No CT evidence for acute diverticulitis. Appendix is normal. No abnormal bowel wall thickening or evidence for bowel obstruction. The stomach is normal morphology. No free fluid or free intraperitoneal air. Vascular/Lymphatic: Peripheral calcified atherosclerotic plaque. No retroperitoneal lymphadenopathy. Reproductive: Patient status post hysterectomy. There is a 3.1 x 2.7 cm right adnexal cystic lesion. Other: Diastases of the rectus abdominus musculature. Musculoskeletal: Lower thoracic and lumbar spine degenerative changes. No aggressive or acute appearing osseous lesions. IMPRESSION: No  acute process within the abdomen or pelvis. Three adjacent indeterminate intermediate density lesions within the inferior pole of the left kidney. These may represent hemorrhagic cysts however solid lesions such as renal cell carcinomas cannot be excluded. These need dedicated evaluation with pre and post contrast-enhanced MRI in the non acute setting. Indeterminate left adrenal nodule. This can be characterized on above recommended MRI. 3.1 cm adnexal cystic lesion. Recommend pelvic ultrasound in the non acute setting. Aortic atherosclerosis. Pancolonic diverticulosis without evidence for acute diverticulitis. Hepatic steatosis. These results will be called to the ordering clinician or representative by the Radiologist Assistant, and communication documented in the PACS or zVision Dashboard. Electronically Signed   By: Polly Cobia.D.  On: 07/02/2016 13:09   Dg Chest 2 View  Result Date: 07/02/2016 CLINICAL DATA:  Non smoker, htn. No previous chest hx per pt. Pt reports cough and abdominal pain with some nausea and dry heaving since yesterday. EXAM: CHEST  2 VIEW COMPARISON:  06/15/2016 FINDINGS: The heart is enlarged. There are no focal consolidations or pleural effusions. No pulmonary edema. IMPRESSION: Stable cardiomegaly. Electronically Signed   By: Nolon Nations M.D.   On: 07/02/2016 10:12   US Abdomen Limited Ruq  Result Date: 07/02/2016 CLINICAL DATA:  Patient with right upper quadrant pain. Decreased appetite. Nausea. EXAM: US ABDOMEN LIMITED - RIGHT UPPER QUADRANT COMPARISON:  Renal ultrasound 06/15/2016. FINDINGS: Gallbladder: No gallstones or wall thickening visualized. No sonographic Murphy sign noted by sonographer. Common bile duct: Diameter: 4 mm Liver: Diffusely increased in echogenicity.  No focal lesion identified. Incidental note is made of a right renal cyst. IMPRESSION: No cholelithiasis or sonographic evidence for acute cholecystitis. Hepatic steatosis. Electronically Signed   By:  Lovey Newcomer M.D.   On: 07/02/2016 11:35    EKG:  Assessment/Plan Principal Problem:   Hyponatremia Active Problems:   Essential hypertension   Chronic respiratory failure with hypoxia (HCC)   Morbid obesity due to excess calories ([facility])   Chronic diastolic congestive heart failure ([facility])   Physical deconditioning   Normocytic anemia   Abdominal pain   Nausea   Elevated LFTs   Acute renal failure superimposed on stage 1 chronic kidney disease ([place])   Kidney lesion   Diverticulosis   #1. Hyponatremia. Patient with some history of same. Serum sodium 125 on admission. Chart review indicates sodium level 135 at discharge 2 weeks ago. Likely related to decreased oral intake secondary to nausea in the setting of daily Lasix. She reports being discharged on a 2 g sodium restricted diet -Admit to medical floor -Gentle IV fluids -Hold Lasix for now -We'll obtain a serum osmolality -We'll obtain a urine osmolality and urine sodium -Recheck in the morning  #2. Nausea/abdominal pain. Etiology unclear. LFTs mildly elevated lipase 95. Ultra sound of the abdomen without cholelithiasis or acute cholecystitis. Hepatic steatosis. CT abdomen with diverticulosis without diverticulitis Patient denies any abdominal pain and reports nausea with eating -Scheduled Zofran -IV fluids as noted above -Start with a clear liquid diet to be advanced as tolerated -Continue Pepcid -Monitor closely  #3. Hypertension. Fair control in the emergency department. Home medications include Cardizem, Lasix. Of note patient is on an ACE inhibitor which was discontinued 2 weeks ago secondary to renal function. -Continue Cardizem -Hold the Lasix as noted above -When necessary hydralazine -Monitor closely  #4. COPD/chronic respiratory failure. Patient stopped smoking 30 years ago. She is on oxygen at night. Chest x-ray reveals stable cardiomegaly. Oxygen saturation level greater than 90% on room air. Stable at  baseline -Continue home meds -Oxygen supplementation as indicated  #5. Chronic diastolic heart failure. Appears stable at baseline. ACE inhibitor recently discontinued. Ejection fraction 60% 2014 with grade 1 diastolic dysfunction -Daily weights -Monitor intake and output -Holding Lasix for now as noted above -Obtain a 2-D echo for completeness  #6. Normocytic anemia. Likely related to chronic disease. Hemoglobin 11 on admission. Chart review indicates hemoglobin 9.2 during last hospitalization. No obvious signs of bleeding -Monitor  #7. Acute on chronic kidney disease. Chart review indicates baseline creatinine appears to be 1.8. CT abdomen/pelvis with non-obstructing stones and left kidney lesion. At discharge 2 weeks ago creatinine 3.0. On admission today creatinine 4.16. Likely related to above -  See #1 and #2 -Monitor urine output -Discharge summary indicates patient to follow-up with Dr. Florene Glen regarding chronic kidney disease -OP MRI to evaluate lesion left kidney    DVT prophylaxis: heparin  Code Status: full  Family Communication: daughter at bedside  Disposition Plan: home  Consults called: none  Admission status: observation    Radene Gunning MD [facility]  If 7PM-7AM, please contact night-coverage [email or web address] Password [facility]  07/02/2016, 2:39 PM

## 2016-07-02 NOTE — ED Notes (Signed)
Patient transported to X-ray 

## 2016-07-03 ENCOUNTER — Observation Stay (HOSPITAL_BASED_OUTPATIENT_CLINIC_OR_DEPARTMENT_OTHER): Payer: Medicare Other

## 2016-07-03 DIAGNOSIS — R7989 Other specified abnormal findings of blood chemistry: Secondary | ICD-10-CM | POA: Diagnosis not present

## 2016-07-03 DIAGNOSIS — J9611 Chronic respiratory failure with hypoxia: Secondary | ICD-10-CM

## 2016-07-03 DIAGNOSIS — R0600 Dyspnea, unspecified: Secondary | ICD-10-CM

## 2016-07-03 DIAGNOSIS — I5032 Chronic diastolic (congestive) heart failure: Secondary | ICD-10-CM | POA: Diagnosis not present

## 2016-07-03 DIAGNOSIS — E871 Hypo-osmolality and hyponatremia: Secondary | ICD-10-CM | POA: Diagnosis not present

## 2016-07-03 DIAGNOSIS — I10 Essential (primary) hypertension: Secondary | ICD-10-CM | POA: Diagnosis not present

## 2016-07-03 DIAGNOSIS — R109 Unspecified abdominal pain: Secondary | ICD-10-CM | POA: Diagnosis not present

## 2016-07-03 LAB — CBC
HCT: 27.6 % — ABNORMAL LOW (ref 36.0–46.0)
HEMOGLOBIN: 9.4 g/dL — AB (ref 12.0–15.0)
MCH: 29.9 pg (ref 26.0–34.0)
MCHC: 34.1 g/dL (ref 30.0–36.0)
MCV: 87.9 fL (ref 78.0–100.0)
Platelets: 194 10*3/uL (ref 150–400)
RBC: 3.14 MIL/uL — ABNORMAL LOW (ref 3.87–5.11)
RDW: 14.5 % (ref 11.5–15.5)
WBC: 5.7 10*3/uL (ref 4.0–10.5)

## 2016-07-03 LAB — COMPREHENSIVE METABOLIC PANEL
ALK PHOS: 39 U/L (ref 38–126)
ALT: 71 U/L — AB (ref 14–54)
AST: 35 U/L (ref 15–41)
Albumin: 3.3 g/dL — ABNORMAL LOW (ref 3.5–5.0)
Anion gap: 10 (ref 5–15)
BUN: 74 mg/dL — ABNORMAL HIGH (ref 6–20)
CALCIUM: 10.3 mg/dL (ref 8.9–10.3)
CO2: 22 mmol/L (ref 22–32)
CREATININE: 3.9 mg/dL — AB (ref 0.44–1.00)
Chloride: 95 mmol/L — ABNORMAL LOW (ref 101–111)
GFR, EST AFRICAN AMERICAN: 12 mL/min — AB (ref >60)
GFR, EST NON AFRICAN AMERICAN: 10 mL/min — AB (ref >60)
Glucose, Bld: 90 mg/dL (ref 65–99)
Potassium: 3.9 mmol/L (ref 3.5–5.1)
Sodium: 127 mmol/L — ABNORMAL LOW (ref 135–145)
TOTAL PROTEIN: 6.4 g/dL — AB (ref 6.5–8.1)
Total Bilirubin: 0.6 mg/dL (ref 0.3–1.2)

## 2016-07-03 LAB — PROTIME-INR
INR: 1.05
PROTHROMBIN TIME: 13.7 s (ref 11.4–15.2)

## 2016-07-03 LAB — TSH: TSH: 1.039 u[IU]/mL (ref 0.350–4.500)

## 2016-07-03 LAB — ECHOCARDIOGRAM COMPLETE
HEIGHTINCHES: 64 in
WEIGHTICAEL: 2804.25 [oz_av]

## 2016-07-03 MED ORDER — SODIUM CHLORIDE 0.9 % IV SOLN
INTRAVENOUS | Status: DC
  Administered 2016-07-03 – 2016-07-04 (×2): via INTRAVENOUS

## 2016-07-03 NOTE — Progress Notes (Addendum)
Patient ID: Mallory Chavez, female   DOB: July 24, 1935, 81 y.o.   MRN: [id number]  PROGRESS NOTE    WAVERLY TARQUINIO  HUT:[id number] DOB: [birth year] DOA: 07/02/2016  PCP: Philis Fendt, MD   Brief Narrative:  81 y.o. female with past medical history significant for COPD on oxygen at night only, hypertension, GERD, diastolic heart failure who presented to [facility] ED with generalized weakness and intermittent nausea, decreased PO intake.   She was hospitalized from 06/15/16 - 06/19/16 for resp failure with hypoxia due to COPD.  Initial evaluation revealed hyponatremia, mildly elevated LFTs (AST 45 and ALT 92, normal bilirubin and ALP). Cr was 4.16 and about 2 weeks ago it was as high as 4.02 but has trended down to 3.03 prior to discharge. CXR showed no acute cardiopulmonary disease. CT abdomen showed no acute process within the abdomen or pelvis however 3 adjacent indeterminate intermediate density lesions within the inferior pole of the left kidney were identified. These may represent hemorrhagic cysts however solid lesions such as renal cell carcinomas cannot be excluded and need dedicated evaluation with pre and post contrast-enhanced MRI in the non acute setting. Indeterminate left adrenal nodule was also seen and 3.1 cm adnexal cystic lesion.   Assessment & Plan:   Hyponatremia - Sodium was 132 at discharge recently - Sodium 125 on this admission and repeat level 127 this am - Low sodium possibly from lasix - Osmolarity is 294, urine sodium is 24 and urine osmolarity is 341 - Check TSH level - Holing lasix - Continue low rate IV fluids for next 24 hours and check BMP in am  Nausea and abdominal pain. - LFT's mildly elevated - Lipase 95 on this admission - US abdomen without cholelithiasis or acute cholecystitis. Hepatic steatosis.  - Abdominal pain now resolved  - Diet as tolerated  Hypertension, essential - Continue Cardizem 360 mg daily - Lasix on hold due to hyponatremia    COPD / chronic respiratory failure with hypoxia - Patient stopped smoking 30 years ago - She is on oxygen at night - Continue Dulera inhaler  - Chest x-ray reveals stable cardiomegaly  Chronic diastolic heart failure - 2 D ECHO on this admission with normal EF and grade 1 diastolic dysfunction  - Appears stable at baseline - ACE inhibitor recently discontinued due to CKD - Continue Cardizem - Lasix on hold due to hyponatremia - Monitor daily weight  - Weight 07/02/16 was 79.5 kg  Anemia of chronic kidney disease - Hemoglobin 9.4, down from 11.1, likely dilutional from IV fluids  - Follow up CBC in am  Acute on chronic kidney disease stage 5 - Cr on previous admission as high as 4.02 and on this admission slightly above this value, 4.16 - Her Cr prior to last discharge trended down to 3 and Cr on this admission is improving with IV fluids  Left renal lesions and adnexal lesion - Seen on CT abdomen/pelvis - Will order MRI abdomen for further evaluation   Obesity - Body mass index is 30.08 kg/m - Seen by dietician        DVT prophylaxis: Heparin suBQ Code Status: full code  Family Communication: No family at the bedside this am Disposition Plan: home once sodium level closer to WNL; [place] orders placed    Consultants:   None   Procedures:   2 D ECHO  Antimicrobials:   None    Subjective: Says she feels little better but still weak.   Objective: Vitals:  07/02/16 1713 07/02/16 2011 07/03/16 0544 07/03/16 0937  BP: 131/65 (!) 158/63 (!) 151/65 120/62  Pulse:  86 84 73  Resp:  19 20 18   Temp:  98.7 F (37.1 C) 98 F (36.7 C) 98 F (36.7 C)  TempSrc:  Oral Oral Oral  SpO2:  98% 99% 98%  Weight:  79.5 kg (175 lb 4.3 oz)    Height:        Intake/Output Summary (Last 24 hours) at 07/03/16 1639 Last data filed at 07/03/16 1030  Gross per 24 hour  Intake          1613.34 ml  Output              400 ml  Net          1213.34 ml   Filed Weights    07/02/16 1520 07/02/16 2011  Weight: 79.6 kg (175 lb 8 oz) 79.5 kg (175 lb 4.3 oz)    Examination:  General exam: Appears calm and comfortable  Respiratory system: Clear to auscultation. Respiratory effort normal. Cardiovascular system: S1 & S2 heard, Rate controlled  Gastrointestinal system: Abdomen is nondistended, soft and nontender. No organomegaly or masses felt. Normal bowel sounds heard. Central nervous system: Alert and oriented. No focal neurological deficits. Extremities: Symmetric 5 x 5 power. Skin: No rashes, lesions or ulcers Psychiatry: Judgement and insight appear normal. Mood & affect appropriate.   Data Reviewed: I have personally reviewed following labs and imaging studies  CBC:  Recent Labs Lab 07/02/16 0924 07/03/16 0656  WBC 8.9 5.7  NEUTROABS 7.3  --   HGB 11.1* 9.4*  HCT 31.3* 27.6*  MCV 84.4 87.9  PLT 226 001   Basic Metabolic Panel:  Recent Labs Lab 07/02/16 0924 07/03/16 0656  NA 125* 127*  K 4.4 3.9  CL 89* 95*  CO2 27 22  GLUCOSE 119* 90  BUN 88* 74*  CREATININE 4.16* 3.90*  CALCIUM 11.1* 10.3   GFR: Estimated Creatinine Clearance: 12.1 mL/min (by C-G formula based on SCr of 3.9 mg/dL (H)). Liver Function Tests:  Recent Labs Lab 07/02/16 0924 07/03/16 0656  AST 45* 35  ALT 92* 71*  ALKPHOS 46 39  BILITOT 0.8 0.6  PROT 7.5 6.4*  ALBUMIN 3.6 3.3*    Recent Labs Lab 07/02/16 0924  LIPASE 95*   No results for input(s): AMMONIA in the last 168 hours. Coagulation Profile:  Recent Labs Lab 07/03/16 0656  INR 1.05   Cardiac Enzymes: No results for input(s): CKTOTAL, CKMB, CKMBINDEX, TROPONINI in the last 168 hours. BNP (last 3 results) No results for input(s): PROBNP in the last 8760 hours. HbA1C: No results for input(s): HGBA1C in the last 72 hours. CBG: No results for input(s): GLUCAP in the last 168 hours. Lipid Profile: No results for input(s): CHOL, HDL, LDLCALC, TRIG, CHOLHDL, LDLDIRECT in the last 72  hours. Thyroid Function Tests: No results for input(s): TSH, T4TOTAL, FREET4, T3FREE, THYROIDAB in the last 72 hours. Anemia Panel: No results for input(s): VITAMINB12, FOLATE, FERRITIN, TIBC, IRON, RETICCTPCT in the last 72 hours. Urine analysis:    Component Value Date/Time   COLORURINE YELLOW 07/02/2016 1058   APPEARANCEUR CLEAR 07/02/2016 1058   LABSPEC 1.010 07/02/2016 1058   PHURINE 6.0 07/02/2016 1058   GLUCOSEU NEGATIVE 07/02/2016 1058   HGBUR NEGATIVE 07/02/2016 1058   BILIRUBINUR NEGATIVE 07/02/2016 1058   KETONESUR NEGATIVE 07/02/2016 1058   PROTEINUR 30 (A) 07/02/2016 1058   NITRITE NEGATIVE 07/02/2016 1058   LEUKOCYTESUR SMALL (A)  07/02/2016 1058   Sepsis Labs: @LABRCNTIP (procalcitonin:4,lacticidven:4)   )No results found for this or any previous visit (from the past 240 hour(s)).    Radiology Studies: Ct Abdomen Pelvis Wo Contrast Result Date: 07/02/2016 No acute process within the abdomen or pelvis. Three adjacent indeterminate intermediate density lesions within the inferior pole of the left kidney. These may represent hemorrhagic cysts however solid lesions such as renal cell carcinomas cannot be excluded. These need dedicated evaluation with pre and post contrast-enhanced MRI in the non acute setting. Indeterminate left adrenal nodule. This can be characterized on above recommended MRI. 3.1 cm adnexal cystic lesion. Recommend pelvic ultrasound in the non acute setting. Aortic atherosclerosis. Pancolonic diverticulosis without evidence for acute diverticulitis. Hepatic steatosis.   Dg Chest 2 View Result Date: 07/02/2016 Stable cardiomegaly.   US Abdomen Limited Ruq Result Date: 1/1/2018No cholelithiasis or sonographic evidence for acute cholecystitis. Hepatic steatosis.     Scheduled Meds: . aspirin EC  81 mg Oral QHS  . diltiazem  360 mg Oral Daily  . febuxostat  40 mg Oral Daily  . feeding supplement (ENSURE ENLIVE)  237 mL Oral BID BM  . heparin  5,000  Units Subcutaneous Q8H  . loratadine  10 mg Oral Daily  . mometasone-formoterol  2 puff Inhalation BID  . pantoprazole  40 mg Oral Daily   Continuous Infusions: . sodium chloride 50 mL/hr at 07/03/16 1054     LOS: 0 days    Time spent: 25 minutes  Greater than 50% of the time spent on counseling and coordinating the care.   Leisa Lenz, MD [facility] Pager [phone]  If 7PM-7AM, please contact night-coverage [email or web address] Password [facility] 07/03/2016, 4:39 PM

## 2016-07-03 NOTE — Progress Notes (Signed)
  Echocardiogram 2D Echocardiogram has been performed.  Mallory Chavez 07/03/2016, 8:47 AM

## 2016-07-03 NOTE — Progress Notes (Signed)
Na+ this a.m. Was 127. MD notified. MD ordered verbally to continue 0.9% NS at 50 ml/hr. Order placed and followed. Will continue monitor.

## 2016-07-03 NOTE — Care Management Note (Addendum)
Case Management Note  Patient Details  Name: Mallory Chavez MRN: [id number] Date of Birth: 08/15/1935  Subjective/Objective:        CM following for progression and d/c planning.             Action/Plan: 07/03/2016 Met with pt to discuss OBS status, pt states that she is active with [facility] for HH services and wishes to continue these services. Mary Y, [facility] notified of pt adm to the hospital . She will see this pt .   Expected Discharge Date:                  Expected Discharge Plan:  Home w Home Health Services  In-House Referral:  NA  Discharge planning Services  CM Consult  Post Acute Care Choice:  Home Health Choice offered to:  Patient  DME Arranged:   NA DME Agency:   NA  HH Arranged:  RN, PTOT and HH aide. HH Agency:  [facility] (now [facility])  Status of Service:  In process, will continue to follow  If discussed at Long Length of Stay Meetings, dates discussed:    Additional Comments:  Royal, Cheryl U, RN 07/03/2016, 3:03 PM  

## 2016-07-03 NOTE — Evaluation (Signed)
Physical Therapy Evaluation Patient Details Name: Mallory Chavez MRN: [id number] DOB: Apr 22, 1936 Today's Date: 07/03/2016   History of Present Illness  HPI: Mallory Chavez is a very pleasant 81 y.o. female with medical history significant for COPD on oxygen at night only, hypertension, GERD, diastolic heart failure presents to the emergency department chief complaint of generalized weakness and intermittent nausea. Initial evaluation reveals hyponatremia mildly elevated LFTs acute on chronic kidney disease.  Clinical Impression   Pt admitted with above diagnosis. Pt currently with functional limitations due to the deficits listed below (see PT Problem List). Mallory Chavez tells me she uses her rollator RW well at home; has been generally fatigued since last admit, and plans to dc home to her daughter's home while she recuperates; Agree with continuing HHPT services;  Pt will benefit from skilled PT to increase their independence and safety with mobility to allow discharge to the venue listed below.       Follow Up Recommendations Home health PT;Supervision - Intermittent    Equipment Recommendations  None recommended by PT    Recommendations for Other Services       Precautions / Restrictions Precautions Precautions: None      Mobility  Bed Mobility               General bed mobility comments: Pt EOB upon arrival  Transfers Overall transfer level: Needs assistance Equipment used: Rolling walker (2 wheeled) Transfers: Sit to/from Stand Sit to Stand: Supervision         General transfer comment: Cues for hand placement  Ambulation/Gait Ambulation/Gait assistance: Supervision Ambulation Distance (Feet): 120 Feet Assistive device: Rolling walker (2 wheeled) Gait Pattern/deviations: Step-through pattern;Decreased stride length;Drifts right/left Gait velocity: decreased   General Gait Details: Cues to self-monitor for activity tolerance  Stairs             Wheelchair Mobility    Modified Rankin (Stroke Patients Only)       Balance     Sitting balance-Leahy Scale: Good       Standing balance-Leahy Scale: Fair                               Pertinent Vitals/Pain Pain Assessment: No/denies pain    Home Living Family/patient expects to be discharged to:: Private residence Living Arrangements: Alone Available Help at Discharge: Family;Available PRN/intermittently (plans to go home with daughter initially) Type of Home: House Home Access: Stairs to enter Entrance Stairs-Rails: None (Tells me family assisted her up the steps) Entrance Stairs-Number of Steps: 4 Home Layout: One level        Prior Function Level of Independence: Independent with assistive device(s)         Comments: Patient uses cane or walker at times. Uses her rollator RW she got last admission     Hand Dominance        Extremity/Trunk Assessment   Upper Extremity Assessment Upper Extremity Assessment: Overall WFL for tasks assessed    Lower Extremity Assessment Lower Extremity Assessment: Generalized weakness    Cervical / Trunk Assessment Cervical / Trunk Assessment: Normal  Communication   Communication: No difficulties  Cognition Arousal/Alertness: Awake/alert Behavior During Therapy: WFL for tasks assessed/performed Overall Cognitive Status: Within Functional Limits for tasks assessed                      General Comments      Exercises  Assessment/Plan    PT Assessment Patient needs continued PT services  PT Problem List Decreased strength;Decreased activity tolerance;Decreased balance;Decreased mobility;Decreased knowledge of use of DME;Cardiopulmonary status limiting activity;Obesity          PT Treatment Interventions DME instruction;Gait training;Functional mobility training;Therapeutic activities;Balance training;Patient/family education;Stair training    PT Goals (Current goals can be found  in the Care Plan section)  Acute Rehab PT Goals Patient Stated Goal: To return home PT Goal Formulation: With patient Time For Goal Achievement: 07/17/16 Potential to Achieve Goals: Good    Frequency Min 3X/week   Barriers to discharge Decreased caregiver support      Co-evaluation               End of Session   Activity Tolerance: Patient tolerated treatment well Patient left: in chair;with call bell/phone within reach Nurse Communication: Mobility status    Functional Assessment Tool Used: Clinical Judgement Functional Limitation: Mobility: Walking and moving around Mobility: Walking and Moving Around Current Status (C7893): At least 1 percent but less than 20 percent impaired, limited or restricted Mobility: Walking and Moving Around Goal Status [phone]): 0 percent impaired, limited or restricted    Time: 5102-5852 PT Time Calculation (min) (ACUTE ONLY): 27 min   Charges:   PT Evaluation $PT Eval Low Complexity: 1 Procedure PT Treatments $Gait Training: 8-22 mins   PT G Codes:   PT G-Codes **NOT FOR INPATIENT CLASS** Functional Assessment Tool Used: Clinical Judgement Functional Limitation: Mobility: Walking and moving around Mobility: Walking and Moving Around Current Status (D7824): At least 1 percent but less than 20 percent impaired, limited or restricted Mobility: Walking and Moving Around Goal Status [phone]): 0 percent impaired, limited or restricted    Mallory Chavez 07/03/2016, 4:25 PM  Mallory Chavez, [place] Pager [phone] Office [phone]

## 2016-07-03 NOTE — Care Management Obs Status (Signed)
[place] NOTIFICATION   Patient Details  Name: Mallory Chavez MRN: [id number] Date of Birth: 1936-02-25   Medicare Observation Status Notification Given:  Yes    Laurna Shetley, Rory Percy, RN 07/03/2016, 2:33 PM

## 2016-07-03 NOTE — Progress Notes (Signed)
Initial Nutrition Assessment  DOCUMENTATION CODES:   Obesity unspecified  INTERVENTION:  Continue Ensure Enlive po BID, each supplement provides 350 kcal and 20 grams of protein.  Encourage adequate PO intake.   NUTRITION DIAGNOSIS:   Increased nutrient needs related to chronic illness as evidenced by estimated needs.  GOAL:   Patient will meet greater than or equal to 90% of their needs  MONITOR:   PO intake, Supplement acceptance, Diet advancement, Labs, Weight trends, Skin, I & O's  REASON FOR ASSESSMENT:   Malnutrition Screening Tool    ASSESSMENT:   81 y.o. female with medical history significant for COPD on oxygen at night only, hypertension, GERD, diastolic heart failure presents to the emergency department chief complaint of generalized weakness and intermittent nausea. Initial evaluation reveals hyponatremia mildly elevated LFTs acute on chronic kidney disease.  Meal completion 100% at breakfast this AM. Pt does report some abdominal pains, which she associates with the consumption of ice cream on her breakfast tray this AM. Pt reports appetite has been varied along with meal completion at home which has been ongoing over the past 3 weeks due to nausea. She reports she tries to consume at least 3 meals a day with an Ensure shake 1-2 times daily, however on some days she might not consume any meals and will only consume Ensure BID. She does report no po intake the 2-3 days PTA. Per weight records, pt with a 7.4% weight loss in 1 month. Pt currently has Ensure ordered. RD to continue with orders. Pt educated on continuation of Ensure shakes at home especially if po intake becomes poor.   Pt with no observed significant fat or muscle mass loss.   Labs and medications reviewed.   Diet Order:  Diet full liquid Room service appropriate? Yes; Fluid consistency: Thin  Skin:  Reviewed, no issues  Last BM:  1/2  Height:   Ht Readings from Last 1 Encounters:  07/02/16 5'  4" (1.626 m)    Weight:   Wt Readings from Last 1 Encounters:  07/02/16 175 lb 4.3 oz (79.5 kg)    Ideal Body Weight:  54.5 kg  BMI:  Body mass index is 30.08 kg/m.  Estimated Nutritional Needs:   Kcal:  4492-0100  Protein:  85-100 grams  Fluid:  1.7 - 1.9 L/day  EDUCATION NEEDS:   Education needs addressed  Corrin Parker, MS, RD, LDN Pager # [phone] After hours/ weekend pager # [phone]

## 2016-07-04 ENCOUNTER — Observation Stay (HOSPITAL_COMMUNITY): Payer: Medicare Other

## 2016-07-04 DIAGNOSIS — R7989 Other specified abnormal findings of blood chemistry: Secondary | ICD-10-CM | POA: Diagnosis not present

## 2016-07-04 DIAGNOSIS — I10 Essential (primary) hypertension: Secondary | ICD-10-CM

## 2016-07-04 DIAGNOSIS — I5032 Chronic diastolic (congestive) heart failure: Secondary | ICD-10-CM | POA: Diagnosis not present

## 2016-07-04 DIAGNOSIS — E871 Hypo-osmolality and hyponatremia: Secondary | ICD-10-CM | POA: Diagnosis not present

## 2016-07-04 LAB — BASIC METABOLIC PANEL
ANION GAP: 6 (ref 5–15)
BUN: 59 mg/dL — AB (ref 6–20)
CALCIUM: 10.1 mg/dL (ref 8.9–10.3)
CO2: 24 mmol/L (ref 22–32)
Chloride: 104 mmol/L (ref 101–111)
Creatinine, Ser: 3.45 mg/dL — ABNORMAL HIGH (ref 0.44–1.00)
GFR calc Af Amer: 14 mL/min — ABNORMAL LOW (ref >60)
GFR, EST NON AFRICAN AMERICAN: 12 mL/min — AB (ref >60)
GLUCOSE: 87 mg/dL (ref 65–99)
POTASSIUM: 4.6 mmol/L (ref 3.5–5.1)
SODIUM: 134 mmol/L — AB (ref 135–145)

## 2016-07-04 NOTE — Discharge Instructions (Signed)
Hyponatremia Introduction Hyponatremia is when the amount of salt (sodium) in your blood is too low. When salt levels are low, your cells absorb extra water and they swell. The swelling happens throughout the body, but it mostly affects the brain. Follow these instructions at home:  Take medicines only as told by your doctor. Many medicines can make this condition worse. Talk with your doctor about any medicines that you are currently taking.  Carefully follow a recommended diet as told by your doctor.  Carefully follow instructions from your doctor about fluid restrictions.  Keep all follow-up visits as told by your doctor. This is important.  Do not drink alcohol. Contact a doctor if:  You feel sicker to your stomach (nauseous).  You feel more confused.  You feel more tired (fatigued).  Your headache gets worse.  You feel weaker.  Your symptoms go away and then they come back.  You have trouble following the diet instructions. Get help right away if:  You start to twitch and shake (have a seizure).  You pass out (faint).  You keep having watery poop (diarrhea).  You keep throwing up (vomiting). This information is not intended to replace advice given to you by your health care provider. Make sure you discuss any questions you have with your health care provider. Document Released: 02/28/2011 Document Revised: 11/24/2015 Document Reviewed: 06/14/2014  2017 Elsevier

## 2016-07-04 NOTE — Progress Notes (Signed)
Mallory Chavez to be D/C'd Home per MD order.  Discussed prescriptions and follow up appointments with the patient. Prescriptions given to patient, medication list explained in detail. Pt verbalized understanding.  Allergies as of 07/04/2016      Reactions   Peanuts [peanut Oil] Swelling      Medication List    STOP taking these medications   furosemide 80 MG tablet Commonly known as:  LASIX     TAKE these medications   aspirin 81 MG tablet Take 81 mg by mouth at bedtime.   COLCRYS 0.6 MG tablet Generic drug:  colchicine Take 1 tablet by mouth daily as needed (gout).   diltiazem 360 MG 24 hr capsule Commonly known as:  CARDIZEM CD Take 360 mg by mouth daily.   febuxostat 40 MG tablet Commonly known as:  ULORIC Take 40 mg by mouth daily.   feeding supplement (ENSURE ENLIVE) Liqd Take 237 mLs by mouth 2 (two) times daily between meals.   fluticasone 50 MCG/ACT nasal spray Commonly known as:  FLONASE Place 2 sprays into both nostrils daily as needed for allergies.   loratadine 10 MG tablet Commonly known as:  CLARITIN Take 10 mg by mouth daily.   omeprazole 20 MG capsule Commonly known as:  PRILOSEC Take 30-60 min before first meal of the day   PROAIR HFA 108 (90 Base) MCG/ACT inhaler Generic drug:  albuterol Inhale 2 puffs into the lungs every 4 (four) hours as needed for wheezing or shortness of breath.   RED YEAST RICE PO Take 1 capsule by mouth every other day.   SYMBICORT 160-4.5 MCG/ACT inhaler Generic drug:  budesonide-formoterol inhale 2 puffs by mouth twice daily   Turmeric 500 MG Tabs Take 500 mg by mouth daily.       Vitals:   07/04/16 0633 07/04/16 1006  BP: (!) 127/58 (!) 141/58  Pulse: 76 81  Resp: 17   Temp: 99 F (37.2 C) 99.2 F (37.3 C)    Skin clean, dry and intact without evidence of skin break down, no evidence of skin tears noted. IV catheter discontinued intact. Site without signs and symptoms of complications. Dressing and  pressure applied. Pt denies pain at this time. No complaints noted.  An After Visit Summary was printed and given to the patient. Patient escorted via [place], and D/C home via private auto.  Dixie Dials RN, BSN

## 2016-07-04 NOTE — Progress Notes (Signed)
PT Cancellation Note  Patient Details Name: Mallory Chavez MRN: [id number] DOB: April 27, 1936   Cancelled Treatment:    Reason Eval/Treat Not Completed: Patient declined, no reason specified. " I'm leaving so I don't have to do anything you want me to do."   07/04/2016  Donnella Sham, PT [phone]  (pager)  Tessie Fass Demiah Gullickson 07/04/2016, 1:58 PM

## 2016-07-04 NOTE — Progress Notes (Signed)
Pt. Refused MRI. MD notified.

## 2016-07-04 NOTE — Discharge Summary (Signed)
Physician Discharge Summary  Mallory Chavez PIR:[id number] DOB: 07-Mar-1936 DOA: 07/02/2016  PCP: Philis Fendt, MD  Admit date: 07/02/2016 Discharge date: 07/04/2016  Recommendations for Outpatient Follow-up:  Please hold Lasix for one week from discharge, check with primary care physician sodium level and if it continues to be stable you may resume Lasix, perhaps at the lower dose and titrate up if sodium level remains within normal limits.   I spoke with the patient about lesions on the left kidney as well as ovary and they need further evaluation. She declined MRI evaluation here in hospital so those studies (MRI and pelvic ultrasound ) will need to be done on an outpatient basis. Patient aware.   Discharge Diagnoses:  Principal Problem:   Hyponatremia Active Problems:   Essential hypertension   Chronic respiratory failure with hypoxia (HCC)   Morbid obesity due to excess calories (HCC)   Chronic diastolic congestive heart failure (HCC)   Physical deconditioning   Normocytic anemia   Abdominal pain   Nausea   Elevated LFTs   Acute renal failure superimposed on stage 1 chronic kidney disease ([place])   Kidney lesion   Diverticulosis   AKI (acute kidney injury) ([place])    Discharge Condition: stable   Diet recommendation: as tolerated   History of present illness:  81 y.o.femalewith past medical history significant for COPD on oxygen at night only, hypertension, GERD, diastolic heart failure who presented to [facility] ED with generalized weakness and intermittent nausea, decreased PO intake.   She was hospitalized from 06/15/16 - 06/19/16 for resp failure with hypoxia due to COPD.  Initial evaluation revealed hyponatremia, mildly elevated LFTs (AST 45 and ALT 92, normal bilirubin and ALP). Cr was 4.16 and about 2 weeks ago it was as high as 4.02 but has trended down to 3.03 prior to discharge. CXR showed no acute cardiopulmonary disease. CT abdomen showed no acute process within  the abdomen or pelvis however 3 adjacent indeterminate intermediate density lesions within the inferior pole of the left kidney were identified. These may represent hemorrhagic cysts however solid lesions such as renal cell carcinomas cannot be excluded and need dedicated evaluation with pre and post contrast-enhanced MRI in the non acute setting. Indeterminate left adrenal nodule was also seen and 3.1 cm adnexal cystic lesion.   Hospital Course:   Assessment & Plan:   Hyponatremia - Sodium was 132 at discharge recently - Sodium 125 on this admission and repeat level 127, 134 - Low sodium possibly from lasix - Osmolarity is 294, urine sodium is 24 and urine osmolarity is 341 - TSH level WNL  - Holing lasix  Nausea and abdominal pain. - LFT's mildly elevated - Lipase 95 on this admission - US abdomen without cholelithiasis or acute cholecystitis. Hepatic steatosis.  - Abdominal pain now resolved  - Diet as tolerated  Hypertension, essential - Continue Cardizem 360 mg daily - Lasix on hold due to hyponatremia   COPD / chronic respiratory failure with hypoxia - Patient stopped smoking 30 years ago - She is on oxygen at night - Chest x-ray reveals stable cardiomegaly  Chronic diastolic heart failure - 2 D ECHO on this admission with normal EF and grade 1 diastolic dysfunction  - Appears stable at baseline - ACE inhibitor recently discontinued due to CKD - Continue Cardizem - Lasix on hold due to hyponatremia  Anemia of chronic kidney disease - Hemoglobin 9.4, down from 11.1, likely dilutional from IV fluids  - No reports of bleeding  Acute on chronic kidney disease stage 5 - Cr on previous admission as high as 4.02 and on this admission slightly above this value, 4.16 - Her Cr prior to last discharge trended down to 3 and Cr on this admission is improving with IV fluids - Cr 3.45 prior to discharge   Left renal lesions and adnexal lesion - Seen on CT  abdomen/pelvis - I spoke with the patient about lesions on the left kidney as well as ovary and they need further evaluation. She declined MRI evaluation here in hospital so those studies (MRI and pelvic ultrasound ) will need to be done on an outpatient basis. Patient aware.  Obesity - Body mass index is 30.08 kg/m - Seen by dietician     DVT prophylaxis: Heparin suBQ Code Status: full code  Family Communication: No family at the bedside this am    Consultants:   None   Procedures:   2 D ECHO  Antimicrobials:   None      Signed:  Leisa Lenz, MD  [facility] 07/04/2016, 10:42 AM  Pager #: [phone]  Time spent in minutes: less than 30 minutes    Discharge Exam: Vitals:   07/04/16 0633 07/04/16 1006  BP: (!) 127/58 (!) 141/58  Pulse: 76 81  Resp: 17   Temp: 99 F (37.2 C) 99.2 F (37.3 C)   Vitals:   07/03/16 1809 07/03/16 2212 07/04/16 0633 07/04/16 1006  BP: (!) 143/74 140/69 (!) 127/58 (!) 141/58  Pulse: 74 73 76 81  Resp: 18 18 17    Temp: 98 F (36.7 C) 98.9 F (37.2 C) 99 F (37.2 C) 99.2 F (37.3 C)  TempSrc: Oral Oral Oral Oral  SpO2: 99% 100% 100% 100%  Weight:  82.7 kg (182 lb 4.8 oz)    Height:  5\' 4"  (1.626 m)      General: Pt is alert, follows commands appropriately, not in acute distress Cardiovascular: Regular rate and rhythm, S1/S2 + Respiratory: Clear to auscultation bilaterally, no wheezing, no crackles, no rhonchi Abdominal: Soft, non tender, non distended, bowel sounds +, no guarding Extremities: no cyanosis, pulses palpable bilaterally DP and PT Neuro: Grossly nonfocal  Discharge Instructions  Discharge Instructions    Call MD for:  difficulty breathing, headache or visual disturbances    Complete by:  As directed    Call MD for:  persistant nausea and vomiting    Complete by:  As directed    Call MD for:  redness, tenderness, or signs of infection (pain, swelling, redness, odor or green/yellow  discharge around incision site)    Complete by:  As directed    Call MD for:  severe uncontrolled pain    Complete by:  As directed    Diet - low sodium heart healthy    Complete by:  As directed    Discharge instructions    Complete by:  As directed    Please hold Lasix for one week from discharge, check with primary care physician sodium level and if it continues to be stable you may resume Lasix, perhaps at the lower dose and titrate up if sodium level remains within normal limits.   Increase activity slowly    Complete by:  As directed      Allergies as of 07/04/2016      Reactions   Peanuts [peanut Oil] Swelling      Medication List    STOP taking these medications   furosemide 80 MG tablet Commonly known as:  LASIX     TAKE these medications   aspirin 81 MG tablet Take 81 mg by mouth at bedtime.   COLCRYS 0.6 MG tablet Generic drug:  colchicine Take 1 tablet by mouth daily as needed (gout).   diltiazem 360 MG 24 hr capsule Commonly known as:  CARDIZEM CD Take 360 mg by mouth daily.   febuxostat 40 MG tablet Commonly known as:  ULORIC Take 40 mg by mouth daily.   feeding supplement (ENSURE ENLIVE) Liqd Take 237 mLs by mouth 2 (two) times daily between meals.   fluticasone 50 MCG/ACT nasal spray Commonly known as:  FLONASE Place 2 sprays into both nostrils daily as needed for allergies.   loratadine 10 MG tablet Commonly known as:  CLARITIN Take 10 mg by mouth daily.   omeprazole 20 MG capsule Commonly known as:  PRILOSEC Take 30-60 min before first meal of the day   PROAIR HFA 108 (90 Base) MCG/ACT inhaler Generic drug:  albuterol Inhale 2 puffs into the lungs every 4 (four) hours as needed for wheezing or shortness of breath.   RED YEAST RICE PO Take 1 capsule by mouth every other day.   SYMBICORT 160-4.5 MCG/ACT inhaler Generic drug:  budesonide-formoterol inhale 2 puffs by mouth twice daily   Turmeric 500 MG Tabs Take 500 mg by mouth daily.       Follow-up Information    Philis Fendt, MD. Schedule an appointment as soon as possible for a visit in 1 week(s).   Specialty:  Internal Medicine Contact information: [address] [place] [address] [phone]            The results of significant diagnostics from this hospitalization (including imaging, microbiology, ancillary and laboratory) are listed below for reference.    Significant Diagnostic Studies: Ct Abdomen Pelvis Wo Contrast  Result Date: 07/02/2016 CLINICAL DATA:  Patient with renal insufficiency. Upper abdominal pain. EXAM: CT ABDOMEN AND PELVIS WITHOUT CONTRAST TECHNIQUE: Multidetector CT imaging of the abdomen and pelvis was performed following the standard protocol without IV contrast. COMPARISON:  None. FINDINGS: Lower chest: Heart is mildly enlarged. Trace pericardial fluid. Bilateral lower lung bandlike consolidative opacities. No pleural effusion. Hepatobiliary: Liver is normal in size and contour. Liver is decreased in attenuation. Gallbladder is unremarkable. No intrahepatic or extrahepatic biliary ductal dilatation. Pancreas: Unremarkable Spleen: Unremarkable Adrenals/Urinary Tract: 1.6 cm indeterminate left adrenal nodule (image 23; series 201). Right adrenal gland is normal. The kidneys are lobular in contour bilaterally. There is a 4.7 cm simple cyst exophytic off the inferior pole of the right kidney. Additional smaller exophytic cysts off the interpolar region of the right kidney and within the superior pole of the right kidney. 3 mm stone inferior pole right kidney. Within the inferior pole of the left kidney there are 3 adjacent lesions measuring up to 1.5 cm with an internal density in the 45-50 Hounsfield range. Urinary bladder is unremarkable. Stomach/Bowel: Pancolonic diverticulosis. No CT evidence for acute diverticulitis. Appendix is normal. No abnormal bowel wall thickening or evidence for bowel obstruction. The stomach is normal  morphology. No free fluid or free intraperitoneal air. Vascular/Lymphatic: Peripheral calcified atherosclerotic plaque. No retroperitoneal lymphadenopathy. Reproductive: Patient status post hysterectomy. There is a 3.1 x 2.7 cm right adnexal cystic lesion. Other: Diastases of the rectus abdominus musculature. Musculoskeletal: Lower thoracic and lumbar spine degenerative changes. No aggressive or acute appearing osseous lesions. IMPRESSION: No acute process within the abdomen or pelvis. Three adjacent indeterminate intermediate density lesions within the inferior pole  of the left kidney. These may represent hemorrhagic cysts however solid lesions such as renal cell carcinomas cannot be excluded. These need dedicated evaluation with pre and post contrast-enhanced MRI in the non acute setting. Indeterminate left adrenal nodule. This can be characterized on above recommended MRI. 3.1 cm adnexal cystic lesion. Recommend pelvic ultrasound in the non acute setting. Aortic atherosclerosis. Pancolonic diverticulosis without evidence for acute diverticulitis. Hepatic steatosis. These results will be called to the ordering clinician or representative by the Radiologist Assistant, and communication documented in the PACS or zVision Dashboard. Electronically Signed   By: Lovey Newcomer M.D.   On: 07/02/2016 13:09   Dg Chest 2 View  Result Date: 07/02/2016 CLINICAL DATA:  Non smoker, htn. No previous chest hx per pt. Pt reports cough and abdominal pain with some nausea and dry heaving since yesterday. EXAM: CHEST  2 VIEW COMPARISON:  06/15/2016 FINDINGS: The heart is enlarged. There are no focal consolidations or pleural effusions. No pulmonary edema. IMPRESSION: Stable cardiomegaly. Electronically Signed   By: Nolon Nations M.D.   On: 07/02/2016 10:12   Dg Chest 2 View  Result Date: 06/15/2016 CLINICAL DATA:  Worsening shortness of breath over the past few days, intermittent shortness of breath for 2 weeks, history of  asthma, CHF, hypertension EXAM: CHEST  2 VIEW COMPARISON:  10 19,015 FINDINGS: Enlargement of cardiac silhouette. Prominent central pulmonary arteries. Mediastinal contours normal. Respiratory motion artifacts degrade lateral view. Chronic bronchitic and emphysematous changes with bibasilar atelectasis. No acute infiltrate, pleural effusion or pneumothorax. Bones demineralized. IMPRESSION: COPD changes with bibasilar atelectasis. Enlargement of cardiac silhouette with question pulmonary arterial hypertension. Electronically Signed   By: Lavonia Dana M.D.   On: 06/15/2016 16:18   US Renal  Result Date: 06/15/2016 CLINICAL DATA:  81 year old female with acute on chronic renal failure EXAM: RENAL / URINARY TRACT ULTRASOUND COMPLETE COMPARISON:  Renal ultrasound dated 02/16/2013 FINDINGS: Right Kidney: Length: 12 cm. There is mild increased renal echotexture. The there is no hydronephrosis or echogenic stone. There are multiple right renal cysts measuring up to 4.2 x 4.1 x 4.1 cm in the inferior pole of the right kidney. These cysts are grossly similar to the prior ultrasound. Left Kidney: Length: 11 cm. There is mild increased echotexture. There is no hydronephrosis or echogenic stone. Small left renal measure up to 1.6 cm in the lower pole. There is focal prominence of the lateral cortex of the midportion of the left kidney similar to the prior ultrasound and most likely representing a dromedary hump. Bladder: Appears normal for degree of bladder distention. Fatty infiltration of the liver. IMPRESSION: No hydronephrosis or echogenic stone. Mildly echogenic kidneys likely related to chronic kidney disease. Focal prominence of the lateral interpolar aspect of the left kidney similar to the prior ultrasound and likely representing a dromedary hump. Fatty liver. Electronically Signed   By: Anner Crete M.D.   On: 06/15/2016 21:26   US Abdomen Limited Ruq  Result Date: 07/02/2016 CLINICAL DATA:  Patient with  right upper quadrant pain. Decreased appetite. Nausea. EXAM: US ABDOMEN LIMITED - RIGHT UPPER QUADRANT COMPARISON:  Renal ultrasound 06/15/2016. FINDINGS: Gallbladder: No gallstones or wall thickening visualized. No sonographic Murphy sign noted by sonographer. Common bile duct: Diameter: 4 mm Liver: Diffusely increased in echogenicity.  No focal lesion identified. Incidental note is made of a right renal cyst. IMPRESSION: No cholelithiasis or sonographic evidence for acute cholecystitis. Hepatic steatosis. Electronically Signed   By: Lovey Newcomer M.D.   On: 07/02/2016  11:35    Microbiology: No results found for this or any previous visit (from the past 240 hour(s)).   Labs: Basic Metabolic Panel:  Recent Labs Lab 07/02/16 0924 07/03/16 0656 07/04/16 0520  NA 125* 127* 134*  K 4.4 3.9 4.6  CL 89* 95* 104  CO2 27 22 24   GLUCOSE 119* 90 87  BUN 88* 74* 59*  CREATININE 4.16* 3.90* 3.45*  CALCIUM 11.1* 10.3 10.1   Liver Function Tests:  Recent Labs Lab 07/02/16 0924 07/03/16 0656  AST 45* 35  ALT 92* 71*  ALKPHOS 46 39  BILITOT 0.8 0.6  PROT 7.5 6.4*  ALBUMIN 3.6 3.3*    Recent Labs Lab 07/02/16 0924  LIPASE 95*   No results for input(s): AMMONIA in the last 168 hours. CBC:  Recent Labs Lab 07/02/16 0924 07/03/16 0656  WBC 8.9 5.7  NEUTROABS 7.3  --   HGB 11.1* 9.4*  HCT 31.3* 27.6*  MCV 84.4 87.9  PLT 226 194   Cardiac Enzymes: No results for input(s): CKTOTAL, CKMB, CKMBINDEX, TROPONINI in the last 168 hours. BNP: BNP (last 3 results)  Recent Labs  06/15/16 1509  BNP 47.2    ProBNP (last 3 results) No results for input(s): PROBNP in the last 8760 hours.  CBG: No results for input(s): GLUCAP in the last 168 hours.

## 2016-07-04 NOTE — Progress Notes (Signed)
Patient refusing MRI. States she is extremely claustrophobic. She states she has tried before and panics. Refused even with medication for anxiety. Patient sent back to floor. RN and MD notified.

## 2016-10-24 ENCOUNTER — Other Ambulatory Visit (HOSPITAL_COMMUNITY): Payer: Self-pay | Admitting: *Deleted

## 2016-10-25 ENCOUNTER — Ambulatory Visit (HOSPITAL_COMMUNITY)
Admission: RE | Admit: 2016-10-25 | Discharge: 2016-10-25 | Disposition: A | Discharge status: Home / Self Care | Payer: Medicare Other | Source: Ambulatory Visit | Attending: Nephrology | Admitting: Nephrology

## 2016-10-25 DIAGNOSIS — D631 Anemia in chronic kidney disease: Secondary | ICD-10-CM | POA: Diagnosis not present

## 2016-10-25 DIAGNOSIS — N189 Chronic kidney disease, unspecified: Secondary | ICD-10-CM | POA: Diagnosis not present

## 2016-10-25 MED ORDER — SODIUM CHLORIDE 0.9 % IV SOLN
510.0000 mg | Freq: Once | INTRAVENOUS | Status: AC
  Administered 2016-10-25: 510 mg via INTRAVENOUS
  Filled 2016-10-25: qty 17

## 2016-10-25 NOTE — Discharge Instructions (Signed)

## 2016-11-05 ENCOUNTER — Encounter: Payer: Self-pay | Admitting: Gastroenterology

## 2016-12-03 ENCOUNTER — Encounter: Payer: Self-pay | Admitting: Gastroenterology

## 2016-12-03 ENCOUNTER — Ambulatory Visit (INDEPENDENT_AMBULATORY_CARE_PROVIDER_SITE_OTHER): Payer: Medicare Other | Admitting: Gastroenterology

## 2016-12-03 VITALS — BP 140/72 | HR 68 | Ht 63.0 in | Wt 186.0 lb

## 2016-12-03 DIAGNOSIS — N179 Acute kidney failure, unspecified: Secondary | ICD-10-CM

## 2016-12-03 DIAGNOSIS — N181 Chronic kidney disease, stage 1: Secondary | ICD-10-CM | POA: Diagnosis not present

## 2016-12-03 DIAGNOSIS — J449 Chronic obstructive pulmonary disease, unspecified: Secondary | ICD-10-CM | POA: Diagnosis not present

## 2016-12-03 DIAGNOSIS — I5032 Chronic diastolic (congestive) heart failure: Secondary | ICD-10-CM | POA: Diagnosis not present

## 2016-12-03 DIAGNOSIS — R195 Other fecal abnormalities: Secondary | ICD-10-CM

## 2016-12-03 DIAGNOSIS — D508 Other iron deficiency anemias: Secondary | ICD-10-CM | POA: Diagnosis not present

## 2016-12-03 NOTE — Patient Instructions (Signed)
If you are age 79 or older, your body mass index should be between 23-30. Your Body mass index is 32.95 kg/m. If this is out of the aforementioned range listed, please consider follow up with your Primary Care Provider.  If you are age 32 or younger, your body mass index should be between 19-25. Your Body mass index is 32.95 kg/m. If this is out of the aformentioned range listed, please consider follow up with your Primary Care Provider.   Thank you for choosing [facility]  Dr Wilfrid Lund III

## 2016-12-03 NOTE — Progress Notes (Addendum)
[facility] Consult Note:  History: Mallory Chavez 12/03/2016  Referring physician: Dr. Jeneen Rinks [facility])  Reason for consult/chief complaint: Hem positive stools (Blood has not been visible to pt); Constipation (Miralac qod amd MOM prn. Pt denies abdominal pain); and Hemorrhoids (Had surgery years ago. Denies problems since)   Subjective  HPI:  This is a 81 year old woman referred by her nephrologist above for heme positive stool and iron deficiency anemia. Her last colonoscopy was Dr. Olevia Perches in June 2013 revealed severe colonic pan diverticulosis and no polyps. Iron deficiency with anemia was recently discovered on labs, after which 3 stool cards were positive for occult blood. She denies any visible rectal bleeding, and has a bowel movement about every other day. She has worsening COPD and requires oxygen with any physical activity and overnight.  She denies upper GI symptoms such as dysphagia, odynophagia, early satiety, nausea, vomiting, early satiety or weight loss.  ROS:  Review of Systems  Constitutional: Positive for fatigue. Negative for appetite change and unexpected weight change.  HENT: Negative for mouth sores and voice change.   Eyes: Negative for pain and redness.  Respiratory: Positive for shortness of breath. Negative for cough.   Cardiovascular: Positive for leg swelling. Negative for chest pain and palpitations.  Genitourinary: Negative for dysuria and hematuria.  Musculoskeletal: Negative for arthralgias and myalgias.  Skin: Negative for pallor and rash.  Neurological: Negative for weakness and headaches.  Hematological: Negative for adenopathy.     Past Medical History: Past Medical History:  Diagnosis Date  . Acute renal failure (ARF) ([place]) 06/15/2016  . Allergy   . Arthritis   . Asthma   . CHF (congestive heart failure) ([place]) 1980   controlled with meds  . Chronic respiratory failure ([place])   . CKD (chronic  kidney disease)   . GERD (gastroesophageal reflux disease)   . Hyperglycemia   . Hyperlipidemia   . Hypertension   . Hyponatremia      Past Surgical History: Past Surgical History:  Procedure Laterality Date  . [place]  . TUMOR REMOVAL     lower back/benign and has come back  . VAGINAL HYSTERECTOMY       Family History: Family History  Problem Relation Age of Onset  . Diabetes Mother   . Emphysema Father     Social History: Social History   Social History  . Marital status: Divorced    Spouse name: N/A  . Number of children: 1  . Years of education: N/A   Social History Main Topics  . Smoking status: Former Smoker    Packs/day: 1.50    Years: 20.00    Types: Cigarettes    Quit date: 07/02/1992  . Smokeless tobacco: Never Used  . Alcohol use No  . Drug use: No  . Sexual activity: Not Asked   Other Topics Concern  . None   Social History Narrative  . None    Allergies: Allergies  Allergen Reactions  . Peanuts [Peanut Oil] Swelling    Outpatient Meds: Current Outpatient Prescriptions  Medication Sig Dispense Refill  . amLODipine (NORVASC) 5 MG tablet Take 5 mg by mouth daily.    Marland Kitchen aspirin 81 MG tablet Take 81 mg by mouth at bedtime.     . budesonide-formoterol (SYMBICORT) 160-4.5 MCG/ACT inhaler inhale 2 puffs by mouth twice daily    . cinacalcet (SENSIPAR) 30 MG tablet Take 30 mg by mouth daily.    Marland Kitchen diltiazem (CARDIZEM  CD) 360 MG 24 hr capsule Take 360 mg by mouth daily.  0  . feeding supplement, ENSURE ENLIVE, (ENSURE ENLIVE) LIQD Take 237 mLs by mouth 2 (two) times daily between meals. 237 mL 12  . fluticasone (FLONASE) 50 MCG/ACT nasal spray Place 2 sprays into both nostrils daily as needed for allergies.   0  . furosemide (LASIX) 40 MG tablet Take 40 mg by mouth 2 (two) times daily.    Marland Kitchen loratadine (CLARITIN) 10 MG tablet Take 10 mg by mouth daily.     . Red Yeast Rice Extract (RED YEAST RICE PO) Take 1 capsule by mouth  every other day.    Marland Kitchen PROAIR HFA 108 (90 BASE) MCG/ACT inhaler Inhale 2 puffs into the lungs every 4 (four) hours as needed for wheezing or shortness of breath.      No current facility-administered medications for this visit.       ___________________________________________________________________ Objective   Exam:  BP 140/72   Pulse 68   Ht 5\' 3"  (1.6 m)   Wt 186 lb (84.4 kg)   BMI 32.95 kg/m  She cannot lay flat for an exam, saying it makes her instantly short of breath. Head of bed was elevated to 45 to rule out exam today.  General: this is a(n) Chronically ill-appearing woman wearing supplemental oxygen   Eyes: sclera anicteric, no redness  ENT: oral mucosa moist without lesions, no cervical or supraclavicular lymphadenopathy, good dentition  CV: RRR without murmur, S1/S2, no JVD, 2+ peripheral edema  Resp: clear to auscultation bilaterally, normal RR and effort noted  GI: soft, no tenderness, with active bowel sounds. No guarding or palpable organomegaly noted.  Skin; warm and dry, no rash or jaundice noted  Neuro: awake, alert and oriented x 3. Normal gross motor function and fluent speech  Labs:  10/15/16:   WBC 10, Hgb 1, MCV 79, plt 333 BUN 34 Cr 2.6 Fe 38  IBC 377 (10%) 3 stool cards positive  2D echo Jan 18 nml LVEF, technically poor study No polyps colonoscopy 12/2011.  Severe diverticulosis   Assessment: Encounter Diagnoses  Name Primary?  . Heme positive stool Yes  . Other iron deficiency anemia   . Acute renal failure superimposed on stage 1 chronic kidney disease, unspecified acute renal failure type ([place])   . Chronic diastolic congestive heart failure ([place])   . COPD GOLD II     She is high risk for colonoscopy sedation due to advanced COPD. She is also not able to lay flat, which would make the procedure technically very challenging on top of her known severe diverticulosis.. I explained all this to her, and how the procedure would have  to be done in the hospital endoscopy lab with a definite increased risk for complications related to sedation. She has decided not to proceed with colonoscopy. She will discuss it further with her daughter. The significance of the heme positive stool in the setting is unclear. I suspect that a large amount of her iron deficiency anemia is related to chronic kidney disease. Psych she is already on IV iron treatments.   Thank you for the courtesy of this consult.  Please call me with any questions or concerns.  Nelida Meuse III  CC: [name], MD  Mauricia Area, MD ([facility])   Addendum: [place] labs report from 12/18/16 rec'd.  Hgb 11.8 with MCV 89, RDW 16.5  - H. Loletha Carrow, MD

## 2016-12-20 ENCOUNTER — Ambulatory Visit (INDEPENDENT_AMBULATORY_CARE_PROVIDER_SITE_OTHER): Payer: Medicare Other | Admitting: Internal Medicine

## 2016-12-20 VITALS — BP 132/80 | HR 99 | Wt 185.0 lb

## 2016-12-20 DIAGNOSIS — J9611 Chronic respiratory failure with hypoxia: Secondary | ICD-10-CM | POA: Diagnosis not present

## 2016-12-20 DIAGNOSIS — J449 Chronic obstructive pulmonary disease, unspecified: Secondary | ICD-10-CM

## 2016-12-20 MED ORDER — BUDESONIDE-FORMOTEROL FUMARATE 160-4.5 MCG/ACT IN AERO: 2.0000 | INHALATION_SPRAY | Freq: Two times a day (BID) | RESPIRATORY_TRACT | 0 refills | Status: DC

## 2016-12-20 NOTE — Patient Instructions (Signed)
Work on inhaler technique:  relax and gently blow all the way out then take a nice smooth deep breath back in, triggering the inhaler at same time you start breathing in.  Hold for up to 5 seconds if you can. Blow out thru nose. Rinse and gargle with water when done    Please schedule a follow up visit in 3 months but call sooner if needed - ok to see [name] on return and me every other visit

## 2016-12-20 NOTE — Progress Notes (Signed)
Subjective:    Patient ID: Mallory Chavez, female    DOB: September 29, 1935 MRN: [id number]  Brief patient profile:  3 yowbf quit smoking around 1994 bothered by some seasonal rhinitis spring > fall x around 1990 then 2012 developed breathing problems dx asthma variably responsive to rx referred 10/01/2012 to pulmonary clinic by Dr Mallory Chavez and proved to have GOLD II COPD 10/2012    History of Present Illness  10/01/2012 1st pulmonary /Mallory Chavez  On ACEi cc abruptly 2012 shortness of breath assoc with cough min productive mostly clear. Sob x across a parking lot. Not responding to multiple asthma meds. rec benicar 40/25 one half daily in place of lisnopril GERD diet    04/19/2014 acute  ov/Mallory Chavez re: GOLD II  copd/ no med calendar/ not really clear on how to use meds  Chief Complaint  Patient presents with  . Acute Visit    Pt c/o increased SOB and wheezing x 2 wks.  She is SOB mainly with exertion such as walking from room to room at home. She is using her proair 2-3 times per day.   baseline doe/fatigue HC parking/ Mallory Chavez or [employer]  Now sob across room  Nose runs some, clariton helps  Only using symbicort once a day  Onset of worsening was indolent/progressive, some better p saba  >>Increase Symbicort 160 , and O2 at 4l/m with act , ONO ordered     01/10/2015 f/u ov/Mallory Chavez re: COPD/ no med calendar / on symbicort 160 2bid rare need for saba  Chief Complaint  Patient presents with  . Follow-up    COPD; cough in mornings; SOB w/activity  Mucus is min/white/ cough only in am's x 5-10 m not using 02 at rest/ says using 2lpm with activity like [employer] can do slow pace / does use [employer] parking  - more limited by hip pain now rec No need for 02 at rest/ continue your portable concentrator as is with activity Please schedule a follow up visit in 19months with Mallory Chavez but call sooner if needed  - return with all medications and your medication calendar which you should show to every doctor you  see  07/15/15 Chavez eval/ no change rx/ rec use med calendar and return with it for each ov   09/13/2015  f/u ov/Mallory Chavez re: COPD GOLD II/ obesity/ symbicort 160 2bid / rare saba / 02 2lpm hs and with activity /no med calendar  Chief Complaint  Patient presents with  . Follow-up    O2 qualification today. Pt denies any increased breathing issues - SOB with usual exertion.   doe = chronic = MMRC1 = can walk nl pace, flat grade, can't hurry or go uphills or steps s sob  On 02  rec No need for 02 at rest but 2lpm at bedtime and with activity   Please schedule a follow up visit in 6  months but call sooner if needed to see Mallory Chavez     03/15/2016  f/u ov/Mallory Chavez re: COPD GOLD II/ symb 160 maybe one saba daily /never noct / not using med calendar  Chief Complaint  Patient presents with  . Follow-up    breathing, no coughing, some sob, no wheezing, no chest pain or chest tightness  sleeping ok on 02 2lpm  And prn during the day Doe continues = MMRC1 = can walk nl pace, flat grade, can't hurry or go uphills or steps s sob  On 02 2lpm with walking rec I will ask  that [employer] humidify your oxygen  Stay as active as you can be    12/20/2016  f/u ov/Mallory Chavez re:  GOLD II/ symb 160 2bid  Chief Complaint  Patient presents with  . Follow-up    Pt states her breathing is "not bad" today. She misplaced her albuterol inhaler, but prior to this she was using it 2 x daily on average.   doe =  MMRC2 = can't walk a nl pace on a flat grade s sob but does fine slow and flat eg pushing cart on 2lpm with buggy Sleeps well on 2lpm ok   No obvious day to day or daytime variability or assoc excess/ purulent sputum or mucus plugs or hemoptysis or cp or chest tightness, subjective wheeze or overt sinus or hb symptoms. No unusual exp hx or h/o childhood pna/ asthma or knowledge of premature birth.  Sleeping ok without nocturnal  or early am exacerbation  of respiratory  c/o's or need for noct saba. Also denies any obvious  fluctuation of symptoms with weather or environmental changes or other aggravating or alleviating factors except as outlined above   Current Medications, Allergies, Complete Past Medical History, Past Surgical History, Family History, and Social History were reviewed in [employer] record.  ROS  The following are not active complaints unless bolded sore throat, dysphagia, dental problems, itching, sneezing,  nasal congestion or excess/ purulent secretions, ear ache,   fever, chills, sweats, unintended wt loss, classically pleuritic or exertional cp,  orthopnea pnd or leg swelling, presyncope, palpitations, abdominal pain, anorexia, nausea, vomiting, diarrhea  or change in bowel or bladder habits, change in stools or urine, dysuria,hematuria,  rash, arthralgias, visual complaints, headache, numbness, weakness or ataxia or problems with walking or coordination,  change in mood/affect or memory.              .           Objective:   Physical Exam  11/10/2012       206 vs 12/19/2012 206  Vs 207 01/16/2013 > 04/27/2013  210 > 06/15/2013  208  >211 07/27/2013 > 04/19/14 201 >203 05/10/2014 > 07/05/2014 201 >10/07/2014 200>  01/10/2015   195 > 09/13/2015  193 > 03/15/2016  192  > 12/20/2016 185    Pleasant obese amb bf nad  Vital signs reviewed  - Note on arrival 02 sats  94% on RA     HEENT:  Upper and lower partial plate , turbinates, and orophanx. Nl external ear canals without cough reflex   NECK :  without JVD/Nodes/TM/ nl carotid upstrokes bilaterally   LUNGS: no acc muscle use,   Min insp and exp rhonchi bilaterally   CV:  RRR  no s3 or murmur or increase in P2,   Trace ankle pitting R > L   ABD:  soft and nontender with nl excursion in the supine position. No bruits or organomegaly, bowel sounds nl  MS:  warm without deformities, calf tenderness, cyanosis or clubbing              Assessment & Plan:

## 2016-12-23 ENCOUNTER — Encounter: Payer: Self-pay | Admitting: Internal Medicine

## 2016-12-23 NOTE — Assessment & Plan Note (Signed)
Body mass index is 32.77 kg/m.  -  trending down/ encouraged Lab Results  Component Value Date   TSH 1.039 07/03/2016     Contributing to gerd risk/ doe/reviewed the need and the process to achieve and maintain neg calorie balance > defer f/u primary care including intermittently monitoring thyroid status

## 2016-12-23 NOTE — Assessment & Plan Note (Signed)
04/19/14 walked one lap dropped to 80% required 4lpm with walking to maintain sats  ono RA 04/21/14 desat x 7 h x 12 m at < 89%  04/26/2014 rec 2lpm and repeat ono on 2lpm  - 01/10/2015  Walked 2lpmx 2 laps @ 185 ft each stopped due to hip pain, slow pace/ no sob or desat  -Patient Saturations on Room Air at Rest = 95% Patient Saturations on [employer] while Ambulating = 87% Patient Saturations on 2 Liters of oxygen while Ambulating = 97%  As of 12/20/2016  rec is for no 02 at rest/ 2lpm at hs and with activity

## 2016-12-23 NOTE — Assessment & Plan Note (Addendum)
-   PFT's 11/10/2012 FEV1 0.85 (52%)ratio 56 and no better p B 2 with DLCO 34 corrects to 60 - doe x 5-10 min stationery bike > add tudorza 11/10/12 > d/c 05/2013 and no change  - 12/20/2016  After extensive coaching HFA effectiveness =    75%     Copd with ab component well controlled on just symb 160 2bid  Each maintenance medication was reviewed in detail including most importantly the difference between maintenance and as needed and under what circumstances the prns are to be used.  Please see AVS for specific  Instructions which are unique to this visit and I personally typed out  which were reviewed in detail in writing with the patient and a copy provided.    F/u can be q 3 months

## 2017-02-05 ENCOUNTER — Other Ambulatory Visit: Payer: Self-pay | Admitting: Internal Medicine

## 2017-02-05 ENCOUNTER — Telehealth: Payer: Self-pay | Admitting: Internal Medicine

## 2017-02-05 MED ORDER — BUDESONIDE-FORMOTEROL FUMARATE 160-4.5 MCG/ACT IN AERO: 2.0000 | INHALATION_SPRAY | Freq: Two times a day (BID) | RESPIRATORY_TRACT | 5 refills | Status: DC

## 2017-02-05 NOTE — Telephone Encounter (Signed)
Pt requesting symbicort refill.  This has been sent to preferred pharmacy.  Nothing further needed.  

## 2017-03-15 ENCOUNTER — Other Ambulatory Visit (HOSPITAL_COMMUNITY): Payer: Self-pay | Admitting: Endocrinology

## 2017-03-15 DIAGNOSIS — E211 Secondary hyperparathyroidism, not elsewhere classified: Secondary | ICD-10-CM

## 2017-03-22 ENCOUNTER — Encounter: Payer: Self-pay | Admitting: Adult Health

## 2017-03-22 ENCOUNTER — Ambulatory Visit (INDEPENDENT_AMBULATORY_CARE_PROVIDER_SITE_OTHER): Payer: Medicare Other | Admitting: Adult Health

## 2017-03-22 DIAGNOSIS — J449 Chronic obstructive pulmonary disease, unspecified: Secondary | ICD-10-CM

## 2017-03-22 DIAGNOSIS — J9611 Chronic respiratory failure with hypoxia: Secondary | ICD-10-CM

## 2017-03-22 DIAGNOSIS — Z23 Encounter for immunization: Secondary | ICD-10-CM

## 2017-03-22 NOTE — Progress Notes (Signed)
Chart and office note reviewed in detail  > agree with a/p as outlined    

## 2017-03-22 NOTE — Assessment & Plan Note (Signed)
Stable on current regimen   Plan  Patient Instructions  Order for POC to DME  Flu shot today  Continue on Symbicort 2 puffs Twice daily  , rinse after use.  Continue on Oxygen 2l/m .  Follow up with Dr. Melvyn Novas  In 4-6 months and As needed

## 2017-03-22 NOTE — Progress Notes (Signed)
@Patient  ID: Mallory Chavez, female    DOB: [birth year], 81 y.o.   MRN: [id number]  Chief Complaint  Patient presents with  . Follow-up    COPD     Referring provider: [name], MD  HPI: 81 year old female former smoker followed for GOLD II COPD and O2 RF   TEST  PFT's 11/10/2012 FEV1 0.85 (52%)ratio 56 and no better p B 2 with DLCO 34 corrects to 60  03/22/2017 Follow up ; COPD  Pt returns for 3 month follow up for COPD   . Says overall her breathing is doing okay with no flare of cough or wheezing . Tries to stay active in her church.  She remains on 2l/m oxygen . Has POC but is getting old and needs new machine .  O2 sat walking 87% on RA , On O2 2l/m 96%.  Wants flu shot today .    Allergies  Allergen Reactions  . Peanuts [Peanut Oil] Swelling    Immunization History  Administered Date(s) Administered  . Influenza Split 03/02/2013, 04/12/2014, 03/22/2016  . Influenza Whole 04/01/2012  . Influenza,inj,Quad PF,6+ Mos 04/29/2015  . Pneumococcal Conjugate-13 03/14/2016  . Pneumococcal Polysaccharide-23 07/02/2001    Past Medical History:  Diagnosis Date  . Acute renal failure (ARF) ([place]) 06/15/2016  . Allergy   . Arthritis   . Asthma   . CHF (congestive heart failure) ([place]) 1980   controlled with meds  . Chronic respiratory failure ([place])   . CKD (chronic kidney disease)   . GERD (gastroesophageal reflux disease)   . Hyperglycemia   . Hyperlipidemia   . Hypertension   . Hyponatremia     Tobacco History: History  Smoking Status  . Former Smoker  . Packs/day: 1.50  . Years: 20.00  . Types: Cigarettes  . Quit date: 07/02/1992  Smokeless Tobacco  . Never Used   Counseling given: Not Answered   Outpatient Encounter Prescriptions as of 03/22/2017  Medication Sig  . amLODipine (NORVASC) 5 MG tablet Take 5 mg by mouth daily.  Marland Kitchen aspirin 81 MG tablet Take 81 mg by mouth at bedtime.   . budesonide-formoterol (SYMBICORT) 160-4.5 MCG/ACT inhaler Inhale  2 puffs into the lungs 2 (two) times daily.  . cinacalcet (SENSIPAR) 30 MG tablet Take 30 mg by mouth daily.  . colchicine 0.6 MG tablet Take 0.6 mg by mouth daily as needed.  . diltiazem (CARDIZEM CD) 360 MG 24 hr capsule Take 360 mg by mouth daily.  . feeding supplement, ENSURE ENLIVE, (ENSURE ENLIVE) LIQD Take 237 mLs by mouth 2 (two) times daily between meals.  . fluticasone (FLONASE) 50 MCG/ACT nasal spray Place 2 sprays into both nostrils daily as needed for allergies.   . furosemide (LASIX) 40 MG tablet Take 40 mg by mouth 2 (two) times daily.  Marland Kitchen loratadine (CLARITIN) 10 MG tablet Take 10 mg by mouth daily.   . OXYGEN 2lpm with sleep and exertion  AHC  . PROAIR HFA 108 (90 BASE) MCG/ACT inhaler Inhale 2 puffs into the lungs every 4 (four) hours as needed for wheezing or shortness of breath.   . Red Yeast Rice Extract (RED YEAST RICE PO) Take 1 capsule by mouth every other day.   No facility-administered encounter medications on file as of 03/22/2017.      Review of Systems  Constitutional:   No  weight loss, night sweats,  Fevers, chills, fatigue, or  lassitude.  HEENT:   No headaches,  Difficulty swallowing,  Tooth/dental problems, or  Sore throat,                No sneezing, itching, ear ache, nasal congestion, post nasal drip,   CV:  No chest pain,  Orthopnea, PND,   anasarca, dizziness, palpitations, syncope.   GI  No heartburn, indigestion, abdominal pain, nausea, vomiting, diarrhea, change in bowel habits, loss of appetite, bloody stools.   Resp: No shortness of breath with exertion or at rest.  No excess mucus, no productive cough,  No non-productive cough,  No coughing up of blood.  No change in color of mucus.  No wheezing.  No chest wall deformity  Skin: no rash or lesions.  GU: no dysuria, change in color of urine, no urgency or frequency.  No flank pain, no hematuria   MS:  No joint pain or swelling.  No decreased range of motion.  No back pain. +gout     Physical Exam  BP 138/80 (BP Location: Left Arm, Cuff Size: Normal)   Pulse 85   Ht 5\' 4"  (1.626 m)   Wt 189 lb (85.7 kg)   SpO2 96%   BMI 32.44 kg/m   GEN: A/Ox3; pleasant , NAD, elderly , obese    HEENT:  [place]/AT,  EACs-clear, TMs-wnl, NOSE-clear, THROAT-clear, no lesions, no postnasal drip or exudate noted.   NECK:  Supple w/ fair ROM; no JVD; normal carotid impulses w/o bruits; no thyromegaly or nodules palpated; no lymphadenopathy.    RESP  Clear  P & A; w/o, wheezes/ rales/ or rhonchi. no accessory muscle use, no dullness to percussion  CARD:  RRR, no m/r/g, no peripheral edema, pulses intact, no cyanosis or clubbing.  GI:   Soft & nt; nml bowel sounds; no organomegaly or masses detected.   Musco: Warm bil, no deformities or joint swelling noted.   Neuro: alert, no focal deficits noted.    Skin: Warm, no lesions or rashes    Lab Results:  CBC  BMET  Imaging: No results found.   Assessment & Plan:   COPD GOLD II Stable on current regimen   Plan  Patient Instructions  Order for POC to DME  Flu shot today  Continue on Symbicort 2 puffs Twice daily  , rinse after use.  Continue on Oxygen 2l/m .  Follow up with Dr. Melvyn Novas  In 4-6 months and As needed      Chronic respiratory failure with hypoxia ([place]) Cont on O2 .  POC order to DME      Rexene Edison, NP 03/22/2017

## 2017-03-22 NOTE — Patient Instructions (Addendum)
Order for POC to DME  Flu shot today  Continue on Symbicort 2 puffs Twice daily  , rinse after use.  Continue on Oxygen 2l/m .  Follow up with Dr. Melvyn Novas  In 4-6 months and As needed

## 2017-03-22 NOTE — Addendum Note (Signed)
Addended by: Maryanna Shape A on: 03/22/2017 02:00 PM   Modules accepted: Orders

## 2017-03-22 NOTE — Assessment & Plan Note (Signed)
Cont on O2 .  POC order to DME

## 2017-03-25 ENCOUNTER — Ambulatory Visit (HOSPITAL_COMMUNITY)
Admission: RE | Admit: 2017-03-25 | Discharge: 2017-03-25 | Disposition: A | Discharge status: Home / Self Care | Payer: Medicare Other | Source: Ambulatory Visit | Attending: Endocrinology | Admitting: Endocrinology

## 2017-03-25 DIAGNOSIS — E211 Secondary hyperparathyroidism, not elsewhere classified: Secondary | ICD-10-CM | POA: Insufficient documentation

## 2017-03-25 MED ORDER — TECHNETIUM TC 99M MEDRONATE IV KIT
21.6000 | PACK | Freq: Once | INTRAVENOUS | Status: AC | PRN
  Administered 2017-03-25: 21.6 via INTRAVENOUS

## 2017-07-22 ENCOUNTER — Ambulatory Visit: Payer: Medicare Other | Admitting: Internal Medicine

## 2017-08-09 ENCOUNTER — Ambulatory Visit: Payer: Medicare Other | Admitting: Internal Medicine

## 2017-08-09 ENCOUNTER — Encounter: Payer: Self-pay | Admitting: Internal Medicine

## 2017-08-09 VITALS — BP 180/90 | HR 97 | Ht 64.0 in | Wt 189.0 lb

## 2017-08-09 DIAGNOSIS — J9611 Chronic respiratory failure with hypoxia: Secondary | ICD-10-CM

## 2017-08-09 DIAGNOSIS — J449 Chronic obstructive pulmonary disease, unspecified: Secondary | ICD-10-CM

## 2017-08-09 DIAGNOSIS — I10 Essential (primary) hypertension: Secondary | ICD-10-CM | POA: Diagnosis not present

## 2017-08-09 MED ORDER — ALBUTEROL SULFATE HFA 108 (90 BASE) MCG/ACT IN AERS: 2.0000 | INHALATION_SPRAY | RESPIRATORY_TRACT | 1 refills | Status: DC | PRN

## 2017-08-09 NOTE — Patient Instructions (Addendum)
Be sure to monitor your blood pressure and let Dr Forde Dandy and [place] know exactly what you are taking   Avoid salt   No change in pulmonary medications    Please schedule a follow up visit in 6 months but call sooner if needed  with all medications /inhalers/ solutions in hand so we can verify exactly what you are taking. This includes all medications from all doctors and over the counters

## 2017-08-09 NOTE — Progress Notes (Signed)
Subjective:    Patient ID: Mallory Chavez, female    DOB: 03-30-1936 MRN: [id number]  Brief patient profile:  60 yowbf quit smoking around 1994 bothered by some seasonal rhinitis spring > fall x around 1990 then 2012 developed breathing problems dx asthma variably responsive to rx referred 10/01/2012 to pulmonary clinic by Dr Jeanie Cooks and proved to have GOLD II COPD 10/2012    History of Present Illness  10/01/2012 1st pulmonary /Mallory Chavez  On ACEi cc abruptly 2012 shortness of breath assoc with cough min productive mostly clear. Sob x across a parking lot. Not responding to multiple asthma meds. rec benicar 40/25 one half daily in place of lisnopril GERD diet    04/19/2014 acute  ov/Mallory Chavez re: GOLD II  copd/ no med calendar/ not really clear on how to use meds  Chief Complaint  Patient presents with  . Acute Visit    Pt c/o increased SOB and wheezing x 2 wks.  She is SOB mainly with exertion such as walking from room to room at home. She is using her proair 2-3 times per day.   baseline doe/fatigue HC parking/ Mallory Chavez or [employer]  Now sob across room  Nose runs some, clariton helps  Only using symbicort once a day  Onset of worsening was indolent/progressive, some better p saba  >>Increase Symbicort  To 160 , and O2 at 4l/m with act , ONO ordered     01/10/2015 f/u ov/Mallory Chavez re: COPD/ no med calendar / on symbicort 160 2bid rare need for saba  Chief Complaint  Patient presents with  . Follow-up    COPD; cough in mornings; SOB w/activity  Mucus is min/white/ cough only in am's x 5-10 m not using 02 at rest/ says using 2lpm with activity like [employer] can do slow pace / does use [employer] parking  - more limited by hip pain now rec No need for 02 at rest/ continue your portable concentrator as is with activity Please schedule a follow up visit in 83months with Mallory Chavez but call sooner if needed  - return with all medications and your medication calendar which you should show to every doctor  you see  07/15/15 Chavez eval/ no change rx/ rec use med calendar and return with it for each ov      03/22/17 Chavez rec Order for POC to DME  Flu shot today  Continue on Symbicort 2 puffs Twice daily  , rinse after use.  Continue on Oxygen 2l/m .     08/09/2017  f/u ov/Mallory Chavez re:   GOLD II/ 2lpm  hs and prn daytime  - note poorly controlled hbp Chief Complaint  Patient presents with  . Follow-up    Breathing is doing well today. She states she currently does not have a rescue inhaler.    Dyspnea:   MMRC2 = can't walk a nl pace on a flat grade s sob but does fine slow and flat on 2lpm prn  Cough: no Sleep: on couple of pillows and 2lpm    No obvious day to day or daytime variability or assoc excess/ purulent sputum or mucus plugs or hemoptysis or cp or chest tightness, subjective wheeze or overt sinus or hb symptoms. No unusual exposure hx or h/o childhood pna/ asthma or knowledge of premature birth.  Sleeping ok on 2 pillows/ 2 liters without nocturnal  or early am exacerbation  of respiratory  c/o's or need for noct saba. Also denies any obvious fluctuation of symptoms with  weather or environmental changes or other aggravating or alleviating factors except as outlined above   Current Allergies, Complete Past Medical History, Past Surgical History, Family History, and Social History were reviewed in [employer] record.  ROS  The following are not active complaints unless bolded Hoarseness, sore throat, dysphagia, dental problems, itching, sneezing,  nasal congestion or discharge of excess mucus or purulent secretions, ear ache,   fever, chills, sweats, unintended wt loss or wt gain, classically pleuritic or exertional cp,  orthopnea pnd or leg swelling, presyncope, palpitations, abdominal pain, anorexia, nausea, vomiting, diarrhea  or change in bowel habits or change in bladder habits, change in stools or change in urine, dysuria, hematuria,  rash, arthralgias, visual  complaints, headache, numbness, weakness or ataxia or problems with walking or coordination,  change in mood/affect or memory.        Current Meds  Medication Sig  . allopurinol (ZYLOPRIM) 300 MG tablet Take 150 mg by mouth daily.  Marland Kitchen amLODipine (NORVASC) 5 MG tablet Take 5 mg by mouth daily.  Marland Kitchen aspirin 81 MG tablet Take 81 mg by mouth at bedtime.   . budesonide-formoterol (SYMBICORT) 160-4.5 MCG/ACT inhaler Inhale 2 puffs into the lungs 2 (two) times daily.  . cinacalcet (SENSIPAR) 30 MG tablet Take 30 mg by mouth daily.  Marland Kitchen diltiazem (CARDIZEM CD) 360 MG 24 hr capsule Take 360 mg by mouth daily.  . fluticasone (FLONASE) 50 MCG/ACT nasal spray Place 2 sprays into both nostrils daily as needed for allergies.   . furosemide (LASIX) 40 MG tablet Take 80 mg by mouth every morning. And 40 mg in the pm  . furosemide (LASIX) 80 MG tablet Take 80 mg by mouth daily. And 40 mg in the pm  She has 40 mg tablets on hand also  . loratadine (CLARITIN) 10 MG tablet Take 10 mg by mouth daily.   . OXYGEN 2lpm with sleep and exertion  AHC  . ranitidine (ZANTAC) 150 MG tablet Take 150 mg by mouth daily.  . Red Yeast Rice Extract (RED YEAST RICE PO) Take 1 capsule by mouth every other day.                       .           Objective:   Physical Exam  11/10/2012       206 vs 12/19/2012 206  Vs 207 01/16/2013 > 04/27/2013  210 > 06/15/2013  208  >211 07/27/2013 > 04/19/14 201 >203 05/10/2014 > 07/05/2014 201 >10/07/2014 200>  01/10/2015   195 > 09/13/2015  193 > 03/15/2016  192  > 12/20/2016 185 > 08/11/2017  189     amb bf nad  Vital signs reviewed - Note on arrival 02 sats  97% on  RA  And BP 180/90 p am meds       HEENT: nl   turbinates bilaterally, and oropharynx. Nl external ear canals without cough reflex/ upper and lower partial dental plates    NECK :  without JVD/Nodes/TM/ nl carotid upstrokes bilaterally   LUNGS: no acc muscle use,  Nl contour chest with min insp / exp rhonchi bilaterally     CV:  RRR  no s3 or murmur or increase in P2, and  Trace ankle pitting R > L  ABD:  soft and nontender with nl inspiratory excursion in the supine position. No bruits or organomegaly appreciated, bowel sounds nl  MS:  Nl gait/ ext warm without  deformities, calf tenderness, cyanosis or clubbing No obvious joint restrictions   SKIN: warm and dry without lesions    NEURO:  alert, approp, nl sensorium with  no motor or cerebellar deficits apparent.                Assessment & Plan:

## 2017-08-11 ENCOUNTER — Encounter: Payer: Self-pay | Admitting: Internal Medicine

## 2017-08-11 NOTE — Assessment & Plan Note (Signed)
-   PFT's 11/10/2012 FEV1 0.85 (52%)ratio 56 and no better p B 2 with DLCO 34 corrects to 60 - doe x 5-10 min stationery bike > add tudorza 11/10/12 > d/c 05/2013 and no change  - 08/09/2017  After extensive coaching HFA effectiveness =    90%   Adequate control on present rx, reviewed in detail with pt > no change in rx needed

## 2017-08-11 NOTE — Assessment & Plan Note (Signed)
04/19/14 walked one lap dropped to 80% required 4lpm with walking to maintain sats  ono RA 04/21/14 desat x 7 h x 12 m at < 89%  04/26/2014 rec 2lpm and repeat ono on 2lpm  - 01/10/2015  Walked 2lpmx 2 laps @ 185 ft each stopped due to hip pain, slow pace/ no sob or desat  -Patient Saturations on Room Air at Rest = 95% Patient Saturations on [employer] while Ambulating = 87% Patient Saturations on 2 Liters of oxygen while Ambulating = 97%  As of 08/09/2017  rec is for no 02 at rest/ 2lpm at hs and with activity   Adequate control on present rx, reviewed in detail with pt > no change in rx needed

## 2017-08-11 NOTE — Assessment & Plan Note (Addendum)
Change acei to arb effective 10/01/12 for pseudoasthma > resolved   - Echo 12/29/2012  > Grade I diastolic dysfunction  She is on two different CCB's from two different doctors with poor control  so I rec she let them both know all meds she's actively taking and f/u with Dr Forde Dandy asap for med reconciliation and adjustment if needed    Each maintenance medication was reviewed in detail including most importantly the difference between maintenance and as needed and under what circumstances the prns are to be used.  Please see AVS for specific  Instructions which are unique to this visit and I personally typed out  which were reviewed in detail in writing with the patient and a copy provided.    F/u with all meds each ov here/ q 6 months  And sooner prn

## 2017-09-10 ENCOUNTER — Telehealth: Payer: Self-pay | Admitting: Internal Medicine

## 2017-09-10 NOTE — Telephone Encounter (Signed)
Spoke with pt, she needs her [employer] bus paperwork filled out again. I advised her to bring them to [facility] to sign. Pt understood and will bring them tomorrow afternoon. Nothing further is needed.

## 2017-09-11 ENCOUNTER — Telehealth: Payer: Self-pay | Admitting: Internal Medicine

## 2017-09-11 NOTE — Telephone Encounter (Signed)
Received forms and verbalized to Magda Paganini that they were in [facility]'s cubby. Will route to Leslie's basket.

## 2017-09-12 NOTE — Telephone Encounter (Signed)
Form done  Spoke with the pt and notified that this was done  I have mailed to her per her req

## 2017-10-17 ENCOUNTER — Other Ambulatory Visit: Payer: Self-pay | Admitting: Nephrology

## 2017-10-24 ENCOUNTER — Other Ambulatory Visit: Payer: Self-pay | Admitting: Nephrology

## 2017-10-24 DIAGNOSIS — N2581 Secondary hyperparathyroidism of renal origin: Secondary | ICD-10-CM

## 2017-10-29 ENCOUNTER — Other Ambulatory Visit: Payer: Self-pay | Admitting: Nephrology

## 2017-10-29 DIAGNOSIS — N2581 Secondary hyperparathyroidism of renal origin: Secondary | ICD-10-CM

## 2017-11-01 ENCOUNTER — Other Ambulatory Visit: Payer: Self-pay | Admitting: Nephrology

## 2017-11-01 DIAGNOSIS — N184 Chronic kidney disease, stage 4 (severe): Secondary | ICD-10-CM

## 2017-11-08 ENCOUNTER — Ambulatory Visit
Admission: RE | Admit: 2017-11-08 | Discharge: 2017-11-08 | Disposition: A | Discharge status: Home / Self Care | Payer: Medicare Other | Source: Ambulatory Visit | Attending: Nephrology | Admitting: Nephrology

## 2017-11-08 DIAGNOSIS — N2581 Secondary hyperparathyroidism of renal origin: Secondary | ICD-10-CM

## 2017-11-08 DIAGNOSIS — N184 Chronic kidney disease, stage 4 (severe): Secondary | ICD-10-CM

## 2017-12-19 ENCOUNTER — Other Ambulatory Visit (HOSPITAL_COMMUNITY): Payer: Self-pay | Admitting: Surgery

## 2017-12-19 DIAGNOSIS — N2581 Secondary hyperparathyroidism of renal origin: Secondary | ICD-10-CM

## 2017-12-19 DIAGNOSIS — N184 Chronic kidney disease, stage 4 (severe): Secondary | ICD-10-CM

## 2018-01-31 ENCOUNTER — Encounter (HOSPITAL_COMMUNITY): Payer: Medicare Other

## 2018-01-31 ENCOUNTER — Encounter (HOSPITAL_COMMUNITY)
Admission: RE | Admit: 2018-01-31 | Discharge: 2018-01-31 | Disposition: A | Discharge status: Home / Self Care | Payer: Medicare Other | Source: Ambulatory Visit | Attending: Surgery | Admitting: Surgery

## 2018-01-31 ENCOUNTER — Ambulatory Visit (HOSPITAL_COMMUNITY)
Admission: RE | Admit: 2018-01-31 | Discharge: 2018-01-31 | Disposition: A | Discharge status: Home / Self Care | Payer: Medicare Other | Source: Ambulatory Visit | Attending: Surgery | Admitting: Surgery

## 2018-01-31 DIAGNOSIS — D351 Benign neoplasm of parathyroid gland: Secondary | ICD-10-CM | POA: Insufficient documentation

## 2018-01-31 DIAGNOSIS — N2581 Secondary hyperparathyroidism of renal origin: Secondary | ICD-10-CM

## 2018-01-31 DIAGNOSIS — E0520 Thyrotoxicosis with toxic multinodular goiter without thyrotoxic crisis or storm: Secondary | ICD-10-CM | POA: Diagnosis not present

## 2018-01-31 DIAGNOSIS — N184 Chronic kidney disease, stage 4 (severe): Secondary | ICD-10-CM

## 2018-01-31 MED ORDER — TECHNETIUM TC 99M SESTAMIBI GENERIC - CARDIOLITE
20.0000 | Freq: Once | INTRAVENOUS | Status: AC | PRN
  Administered 2018-01-31: 20 via INTRAVENOUS

## 2018-02-06 ENCOUNTER — Ambulatory Visit: Payer: Medicare Other | Admitting: Internal Medicine

## 2018-02-06 ENCOUNTER — Encounter: Payer: Self-pay | Admitting: Internal Medicine

## 2018-02-06 ENCOUNTER — Ambulatory Visit (INDEPENDENT_AMBULATORY_CARE_PROVIDER_SITE_OTHER)
Admission: RE | Admit: 2018-02-06 | Discharge: 2018-02-06 | Disposition: A | Discharge status: Home / Self Care | Payer: Medicare Other | Source: Ambulatory Visit | Attending: Internal Medicine | Admitting: Internal Medicine

## 2018-02-06 VITALS — BP 142/80 | HR 91 | Ht 64.0 in | Wt 186.0 lb

## 2018-02-06 DIAGNOSIS — J449 Chronic obstructive pulmonary disease, unspecified: Secondary | ICD-10-CM

## 2018-02-06 DIAGNOSIS — I10 Essential (primary) hypertension: Secondary | ICD-10-CM

## 2018-02-06 DIAGNOSIS — J9611 Chronic respiratory failure with hypoxia: Secondary | ICD-10-CM

## 2018-02-06 NOTE — Progress Notes (Signed)
Subjective:    Patient ID: Mallory Chavez, female    DOB: [birth year] MRN: [id number]  Brief patient profile:  73 yowbf quit smoking around 1994 bothered by some seasonal rhinitis spring > fall x around 1990 then 2012 developed breathing problems dx asthma variably responsive to rx referred 10/01/2012 to pulmonary clinic by Dr Jeanie Cooks and proved to have GOLD II COPD 10/2012    History of Present Illness  10/01/2012 1st pulmonary /Mallory Chavez  On ACEi cc abruptly 2012 shortness of breath assoc with cough min productive mostly clear. Sob x across a parking lot. Not responding to multiple asthma meds. rec benicar 40/25 one half daily in place of lisnopril GERD diet    04/19/2014 acute  ov/Mallory Chavez re: GOLD II  copd/ no med calendar/ not really clear on how to use meds  Chief Complaint  Patient presents with  . Acute Visit    Pt c/o increased SOB and wheezing x 2 wks.  She is SOB mainly with exertion such as walking from room to room at home. She is using her proair 2-3 times per day.   baseline doe/fatigue [facility] parking/ [name] or [employer]  Now sob across room  Nose runs some, clariton helps  Only using symbicort once a day  Onset of worsening was indolent/progressive, some better p saba  >>Increase Symbicort  To 160 , and O2 at 4l/m with act , ONO ordered     01/10/2015 f/u ov/Mallory Chavez re: COPD/ no med calendar / on symbicort 160 2bid rare need for saba  Chief Complaint  Patient presents with  . Follow-up    COPD; cough in mornings; SOB w/activity  Mucus is min/white/ cough only in am's x 5-10 m not using 02 at rest/ says using 2lpm with activity like [employer] can do slow pace / does use [employer] parking  - more limited by hip pain now rec No need for 02 at rest/ continue your portable concentrator as is with activity Please schedule a follow up visit in 69months with Mallory Chavez but call sooner if needed  - return with all medications and your medication calendar which you should show to every doctor  you see  07/15/15 Chavez eval/ no change rx/ rec use med calendar and return with it for each ov      03/22/17 Chavez rec Order for POC to DME  Flu shot today  Continue on Symbicort 2 puffs Twice daily  , rinse after use.  Continue on Oxygen 2l/m .     08/09/2017  f/u ov/Mallory Chavez re:   GOLD II/ 2lpm  hs and prn daytime  - note poorly controlled hbp Chief Complaint  Patient presents with  . Follow-up    Breathing is doing well today. She states she currently does not have a rescue inhaler.    Dyspnea:   MMRC2 = can't walk a nl pace on a flat grade s sob but does fine slow and flat on 2lpm prn  Cough: no Sleep: on couple of pillows and 2lpm   rec Be sure to monitor your blood pressure and let Dr Forde Dandy and Detterding know exactly what you are taking  Avoid salt  No change in pulmonary medications  Please schedule a follow up visit in 6 months but call sooner if needed  with all medications /inhalers/ solutions in hand so we can verify exactly what you are taking. This includes all medications from all doctors and over the counters       02/06/2018  f/u ov/Mallory Chavez re:  GOLD II/ 02 2lpm hs and prn daytime  Chief Complaint  Patient presents with  . Follow-up    Increased SOB x 1 month. She has been using her albuterol inhaler 2 x daily on average.   Dyspnea:  Uses hc parking / struggles to do [employer] on 2lpm. Gradually worse since weather turned hot  Cough: none   SABA use: as above  02: 2lpm does not titrate    No obvious day to day or daytime variability or assoc excess/ purulent sputum or mucus plugs or hemoptysis or cp or chest tightness, subjective wheeze or overt sinus or hb symptoms.   Sleeping: bedblocks/ 2 pillows and 2lpm  without nocturnal  or early am exacerbation  of respiratory  c/o's or need for noct saba. Also denies any obvious fluctuation of symptoms with weather or environmental changes or other aggravating or alleviating factors except as outlined above   No unusual  exposure hx or h/o childhood pna/ asthma or knowledge of premature birth.  Current Allergies, Complete Past Medical History, Past Surgical History, Family History, and Social History were reviewed in [employer] record.  ROS  The following are not active complaints unless bolded Hoarseness, sore throat, dysphagia, dental problems, itching, sneezing,  nasal congestion or discharge of excess mucus or purulent secretions, ear ache,   fever, chills, sweats, unintended wt loss or wt gain, classically pleuritic or exertional cp,  orthopnea pnd or arm/hand swelling  or leg swelling, presyncope, palpitations, abdominal pain, anorexia, nausea, vomiting, diarrhea  or change in bowel habits or change in bladder habits, change in stools or change in urine, dysuria, hematuria,  rash, arthralgias, visual complaints, headache, numbness, weakness or ataxia or problems with walking or coordination,  change in mood or  memory.        Current Meds  Medication Sig  . albuterol (PROAIR HFA) 108 (90 Base) MCG/ACT inhaler Inhale 2 puffs into the lungs every 4 (four) hours as needed for wheezing or shortness of breath.  . allopurinol (ZYLOPRIM) 300 MG tablet Take 150 mg by mouth daily.  Marland Kitchen amLODipine (NORVASC) 5 MG tablet Take 5 mg by mouth daily.  Marland Kitchen aspirin 81 MG tablet Take 81 mg by mouth at bedtime.   . budesonide-formoterol (SYMBICORT) 160-4.5 MCG/ACT inhaler Inhale 2 puffs into the lungs 2 (two) times daily.  . cinacalcet (SENSIPAR) 30 MG tablet Take 30 mg by mouth daily.  Marland Kitchen diltiazem (CARDIZEM CD) 360 MG 24 hr capsule Take 360 mg by mouth daily.  . furosemide (LASIX) 80 MG tablet Take 80 mg by mouth daily. And 40 mg in the pm  She has 40 mg tablets on hand also  . loratadine (CLARITIN) 10 MG tablet Take 10 mg by mouth daily.   . OXYGEN 2lpm with sleep and exertion  [facility]                    .           Objective:   Physical Exam  11/10/2012       206 vs 12/19/2012 206  Vs 207  01/16/2013 > 04/27/2013  210 > 06/15/2013  208  >211 07/27/2013 > 04/19/14 201 >203 05/10/2014 > 07/05/2014 201 >10/07/2014 200>  01/10/2015   195 > 09/13/2015  193 > 03/15/2016  192  > 12/20/2016 185 > 08/11/2017  189 > 02/06/2018  186     amb bf nad   Vital signs reviewed - Note on  arrival 02 sats  91% on  2lpm and bp 142/80       HEENT: nl  oropharynx. Nl external ear canals without cough reflex - moderate bilateral non-specific turbinate edema  - upper and lower partial dental plates    NECK :  without JVD/Nodes/TM/ nl carotid upstrokes bilaterally   LUNGS: no acc muscle use,  Mod barrel  contour chest wall with bilateral  Distant bs s audible wheeze and  without cough on insp or exp maneuver and mod   Hyperresonant  to  percussion bilaterally     CV:  RRR  no s3 or murmur or increase in P2, and no edema   ABD:  soft and nontender with pos mid insp Hoover's  in the supine position. No bruits or organomegaly appreciated, bowel sounds nl  MS:   Nl gait/  ext warm without deformities, calf tenderness, cyanosis or clubbing No obvious joint restrictions   SKIN: warm and dry without lesions    NEURO:  alert, approp, nl sensorium with  no motor or cerebellar deficits apparent.           CXR PA and Lateral:   02/06/2018 :    I personally reviewed images and agree with radiology impression as follows:    Enlargement of cardiac silhouette with question pulmonary arterial hypertension.  Bronchitic changes with bibasilar atelectasis.           Assessment & Plan:

## 2018-02-06 NOTE — Patient Instructions (Addendum)
No change in pulmonary medications  Adjust your 02 to keep your saturations above 90% at all times  Please schedule a follow up visit in 3 months but call sooner if needed  - add:  Echo needed to r/o cor pulmonale

## 2018-02-07 NOTE — Progress Notes (Signed)
Spoke with pt and notified of results per Dr. Wert. Pt verbalized understanding and denied any questions. 

## 2018-02-09 ENCOUNTER — Encounter: Payer: Self-pay | Admitting: Internal Medicine

## 2018-02-09 NOTE — Assessment & Plan Note (Addendum)
Change acei to arb effective 10/01/12 for pseudoasthma > resolved   - Echo 12/29/2012  > Grade I diastolic dysfunction  Not ideally controlled, note on two different CCBs with ? Component cor pulmonale vs worse diastolic dysfunction > repeat echo ordered

## 2018-02-09 NOTE — Assessment & Plan Note (Signed)
04/19/14 walked one lap dropped to 80% required 4lpm with walking to maintain sats  ono RA 04/21/14 desat x 7 h x 12 m at < 89%  04/26/2014 rec 2lpm and repeat ono on 2lpm  - 01/10/2015  Walked 2lpmx 2 laps @ 185 ft each stopped due to hip pain, slow pace/ no sob or desat  -Patient Saturations on Room Air at Rest = 95% Patient Saturations on [employer] while Ambulating = 87% Patient Saturations on 2 Liters of oxygen while Ambulating = 97%  As of 02/06/2018  rec is for no 02 at rest/ 2lpm at hs and with activity with goal of maintaining > 90% at all times

## 2018-02-09 NOTE — Assessment & Plan Note (Signed)
-   PFT's 11/10/2012 FEV1 0.85 (52%)ratio 56 and no better p B 2 with DLCO 34 corrects to 60 - doe x 5-10 min stationery bike > add tudorza 11/10/12 > d/c 05/2013 and no change  - 02/06/2018  After extensive coaching inhaler device  effectiveness =  75% (short Ti)    Symptoms worse with onset of hot humid weather as expected but may be developing cor pulmonale as suggested by cxr so rec  No change in rx - may need change to bevespi to add lama at next ov and probably no need for ICS so this is good option  Check echo   I had an extended discussion with the patient reviewing all relevant studies completed to date and  lasting 15 to 20 minutes of a 25 minute visit    See device teaching which extended face to face time for this visit.  Each maintenance medication was reviewed in detail including emphasizing most importantly the difference between maintenance and prns and under what circumstances the prns are to be triggered using an action plan format that is not reflected in the computer generated alphabetically organized AVS which I have not found useful in most complex patients, especially with respiratory illnesses  Please see AVS for specific instructions unique to this visit that I personally wrote and verbalized to the the pt in detail and then reviewed with pt  by my nurse highlighting any  changes in therapy recommended at today's visit to their plan of care.

## 2018-02-10 ENCOUNTER — Telehealth: Payer: Self-pay | Admitting: *Deleted

## 2018-02-10 DIAGNOSIS — R0602 Shortness of breath: Secondary | ICD-10-CM

## 2018-02-10 NOTE — Telephone Encounter (Signed)
-----   Message from Tanda Rockers, MD sent at 02/09/2018  6:21 AM EDT ----- Repeat echo needed - more urgent if breathing not improving or can wait to prior to next ov (at her convenience)

## 2018-02-14 ENCOUNTER — Ambulatory Visit (HOSPITAL_COMMUNITY): Payer: Medicare Other | Attending: Cardiology

## 2018-02-14 ENCOUNTER — Encounter: Payer: Self-pay | Admitting: Internal Medicine

## 2018-02-14 ENCOUNTER — Other Ambulatory Visit: Payer: Self-pay

## 2018-02-14 DIAGNOSIS — R0602 Shortness of breath: Secondary | ICD-10-CM | POA: Diagnosis present

## 2018-02-14 DIAGNOSIS — I700 Atherosclerosis of aorta: Secondary | ICD-10-CM | POA: Diagnosis not present

## 2018-02-14 NOTE — Progress Notes (Signed)
Spoke with pt and notified of results per Dr. Wert. Pt verbalized understanding and denied any questions. 

## 2018-02-24 ENCOUNTER — Telehealth: Payer: Self-pay

## 2018-02-24 NOTE — Telephone Encounter (Signed)
Attempted PA on covermymeds. Will await response in 72 hours. Will route to Maia Plan (Key: [id number])    [facility] is reviewing your PA request. Typically an electronic response will be received within 72 hours. To check for an update later, open this request from your dashboard. You may close this dialog and return to your dashboard to perform other tasks.

## 2018-02-25 NOTE — Telephone Encounter (Signed)
Per CMM- PA for symbicort is not needed, as this medication is preferred.  [facility] has been made aware of this information. Nothing further is needed.

## 2018-03-22 ENCOUNTER — Other Ambulatory Visit: Payer: Self-pay | Admitting: Internal Medicine

## 2018-04-06 NOTE — Progress Notes (Addendum)
@Patient  ID: Mallory Chavez, female    DOB: [birth year], 82 y.o.   MRN: [id number]  Chief Complaint  Patient presents with  . Acute Visit    Short of breath x1 week    Referring provider: [name], MD  HPI:  82 year old female former smoker (30 pack years, quit 1994), followed in our office for GOLD II COPD, allergic rhinitis, initially referred to pulmonary clinic in April/08/2012.  PMH: Hypertension, GERD Smoker/ Smoking History: Former Smoker. Quit 1994. 30 pack years.  Maintenance:  Symbicort 160 Pt of: Dr. Melvyn Novas  Recent [facility] Encounters:   02/06/18-office visit-[facility] Patient having increased shortness of breath for the last month.  Has been using her albuterol inhaler 2 times daily on average.  ROS positive for leg swelling. Plan: Repeat echo ordered, no oxygen at rest, 2 L at night as well as with activity to maintain oxygen saturations greater than 90%, could consider change of Bevespi if SOB persists   04/07/2018  - Visit   82 year old female patient presenting today for acute visit for shortness of breath.  Patient reports that she has had increasing shortness of breath for the last week.  Though she sitting still its okay but with exertion she is short of breath.  On arrival to office visit today patient was on room air and was satting 83%.  Patient had her POC with her her POC was not on.  When the POC was turned on patient's oxygen saturations increased to 88%.  When patient was placed on 2 L continuous 91 percent.  Patient is afebrile today.  Patient is present today with her daughter.  Patient's daughter has multiple concerns regarding patient's adherence to treatment plan.  Patient's daughter reports the patient is not taking all of her blood pressure medications.  The patient reports that the hydralazine as well as the clonidine she believes affects her breathing.  They have not notified primary care regarding this.  Blood pressure today is stable.   Patient's daughter also having concerns regarding patient's adherence to wearing 2 L of oxygen at night.  Patient's daughter reports the patient has not been wearing 2 L of oxygen at night.  Patient reports that she did start wearing her 2 L of oxygen at night 3 nights ago and she is been adherent for the last 3 nights.  Patient reports that it is irritating to have the nasal cannula when she sleeps, patient reporting nasal dryness as well as the nasal cannula falls off sometimes.  Patient unsure why she needs to wear oxygen as she reports "I always sleep well".  Patient does snore.  Patient also has stopped her Zantac for GERD management.  Patient reports some epigastric pain after stopping Zantac.  Patient's daughter provided her Prilosec OTC patient reported that her symptoms improved significantly when she is taking that.  Unfortunately the patient was still taking it with her food.  Patient reporting nonadherence to GERD diet.  Patient reports that she frequents [employer] to get frosty's.  Patient is not following a low-salt diet.  Patient reports partial adherence to her daily diuretics.  Patient reports she took 80 mg of furosemide today she is prescribed 120 mg daily.  Patient reports that she always has chronic lower extremity swelling.  Patient does not wear compression stockings.  Patient is arrived to office visit with all medications and hand.  For [place] to review.  I praised patient for bringing all of her medications this appointment.   Tests:  FENO:  No results found for: NITRICOXIDE  PFT: PFT's 11/10/2012 FEV1 0.85 (52%)ratio 56 and no better p B 2 with DLCO 34 corrects to 60  Imaging: 02/06/2018-chest x-ray- enlarged cardiac silhouette, question of pulmonary arterial hypertension  Cardiac:  07/03/2016-echocardiogram-LV ejection fraction 55 to 60%, mild LVH, grade 1 diastolic dysfunction, PA P pressure 32 02/14/2018-echocardiogram-LV ejection fraction 45 to 45%, grade 1 diastolic  dysfunction, mild LVH PA P pressure 48  Micro / Path / Labwork:   Other:   Chart Review:     Specialty Problems      Pulmonary Problems   DOE (dyspnea on exertion)    Followed in Pulmonary clinic/ [facility]/ [facility]  - Try off acei effective 10/01/2012  - Echo 12/29/12 Doppler parameters are consistent with abnormal left ventricular relaxation (grade 1 diastolic Dysfunction). - Echo 02/14/2018 - Left ventricle: The cavity size was normal. Wall thickness was increased in a pattern of mild LVH. Systolic function was mildly reduced. The estimated ejection fraction was in the range of 45% to 50%. Diffuse hypokinesis. Doppler parameters are consistent with abnormal left ventricular relaxation (grade 1 diastolic dysfunction). - Aortic valve: Trileaflet; mildly thickened, mildly calcified leaflets. - Left atrium: The atrium was mildly dilated. - Pulmonary arteries: Systolic pressure was moderately increased. PA peak pressure: 48 mm Hg (S).        COPD GOLD II    Followed in Pulmonary clinic/ [facility]/ [facility]  - PFT's 11/10/2012 FEV1 0.85 (52%)ratio 56 and no better p B 2 with DLCO 34 corrects to 60 - doe x 5-10 min stationery bike > add tudorza 11/10/12 > d/c 05/2013 and no change  - 02/06/2018  After extensive coaching inhaler device  effectiveness =  75% (short Ti)     04/07/18>>> PFT ordered>>>      Chronic respiratory failure with hypoxia ([place])    04/19/14 walked one lap dropped to 80% required 4lpm with walking to maintain sats  ono RA 04/21/14 desat x 7 h x 12 m at < 89%  04/26/2014 rec 2lpm and repeat ono on 2lpm  - 01/10/2015  Walked 2lpmx 2 laps @ 185 ft each stopped due to hip pain, slow pace/ no sob or desat  -Patient Saturations on Room Air at Rest = 95% Patient Saturations on [employer] while Ambulating = 87% Patient Saturations on 2 Liters of oxygen while Ambulating = 97%  As of 02/06/2018  rec is for no 02 at rest/ 2lpm at hs and with activity        COPD exacerbation ([place])   Acute on chronic respiratory failure (H[facility])      Allergies  Allergen Reactions  . Peanuts [Peanut Oil] Swelling    Immunization History  Administered Date(s) Administered  . Influenza Split 03/02/2013, 04/12/2014, 03/22/2016  . Influenza Whole 04/01/2012  . Influenza, High Dose Seasonal PF 03/22/2017, 03/10/2018  . Influenza,inj,Quad PF,6+ Mos 04/29/2015  . Pneumococcal Conjugate-13 03/14/2016  . Pneumococcal Polysaccharide-23 07/02/2001    Past Medical History:  Diagnosis Date  . Acute renal failure (ARF) ([place]) 06/15/2016  . Allergy   . Arthritis   . Asthma   . CHF (congestive heart failure) ([place]) 1980   controlled with meds  . Chronic respiratory failure ([place])   . CKD (chronic kidney disease)   . GERD (gastroesophageal reflux disease)   . Hyperglycemia   . Hyperlipidemia   . Hypertension   . Hyponatremia     Tobacco History: Social History   Tobacco Use  Smoking Status  Former Smoker  . Packs/day: 1.50  . Years: 20.00  . Pack years: 30.00  . Types: Cigarettes  . Last attempt to quit: 07/02/1992  . Years since quitting: 25.7  Smokeless Tobacco Never Used   Counseling given: Yes  Continue not smoking  Outpatient Encounter Medications as of 04/07/2018  Medication Sig  . albuterol (PROAIR HFA) 108 (90 Base) MCG/ACT inhaler Inhale 2 puffs into the lungs every 4 (four) hours as needed for wheezing or shortness of breath.  Marland Kitchen amLODipine (NORVASC) 5 MG tablet Take 5 mg by mouth daily.  Marland Kitchen aspirin 81 MG tablet Take 81 mg by mouth at bedtime.   . cinacalcet (SENSIPAR) 30 MG tablet Take 30 mg by mouth daily.  Marland Kitchen diltiazem (CARDIZEM CD) 360 MG 24 hr capsule Take 360 mg by mouth daily.  . furosemide (LASIX) 80 MG tablet Take 80 mg by mouth daily. And 40 mg in the pm  She has 40 mg tablets on hand also  . loratadine (CLARITIN) 10 MG tablet Take 10 mg by mouth daily.   . OXYGEN 2lpm with sleep and exertion  [facility]  . SYMBICORT 160-4.5  MCG/ACT inhaler INHALE 2 PUFFS BY MOUTH TWICE DAILY  . allopurinol (ZYLOPRIM) 300 MG tablet Take 150 mg by mouth daily.  Marland Kitchen omeprazole (PRILOSEC) 20 MG capsule Take 1 capsule (20 mg total) by mouth daily.   No facility-administered encounter medications on file as of 04/07/2018.      Review of Systems  Review of Systems  Constitutional: Positive for fatigue. Negative for chills, fever and unexpected weight change.  HENT: Negative for congestion, ear pain, postnasal drip, sinus pressure and sinus pain.   Respiratory: Positive for shortness of breath. Negative for cough, chest tightness and wheezing.   Cardiovascular: Positive for leg swelling (Chronic lower extremity swelling). Negative for chest pain and palpitations.  Gastrointestinal: Negative for blood in stool, diarrhea, nausea and vomiting.       +Indigestion   Genitourinary: Negative for dysuria, frequency and urgency.  Musculoskeletal: Negative for arthralgias.  Skin: Negative for color change.  Allergic/Immunologic: Positive for environmental allergies. Negative for food allergies.  Neurological: Negative for dizziness, light-headedness and headaches.  Psychiatric/Behavioral: Negative for dysphoric mood. The patient is not nervous/anxious.   All other systems reviewed and are negative.    Physical Exam  BP (!) 144/72 (BP Location: Left Arm, Cuff Size: Normal)   Pulse 89   Temp 98.3 F (36.8 C) (Oral)   Ht 5\' 4"  (1.626 m)   Wt 186 lb 3.2 oz (84.5 kg)   SpO2 91%   BMI 31.96 kg/m   Wt Readings from Last 5 Encounters:  04/07/18 186 lb 3.2 oz (84.5 kg)  02/06/18 186 lb (84.4 kg)  08/09/17 189 lb (85.7 kg)  03/22/17 189 lb (85.7 kg)  12/20/16 185 lb (83.9 kg)   >>> not weighing self daily   Physical Exam  Constitutional: She is oriented to person, place, and time and well-developed, well-nourished, and in no distress. No distress.  HENT:  Head: Normocephalic and atraumatic.  Right Ear: Hearing, tympanic membrane,  external ear and ear canal normal.  Left Ear: Hearing, tympanic membrane, external ear and ear canal normal.  Nose: Mucosal edema and rhinorrhea present. Right sinus exhibits no maxillary sinus tenderness and no frontal sinus tenderness. Left sinus exhibits no maxillary sinus tenderness and no frontal sinus tenderness.  Mouth/Throat: Uvula is midline and oropharynx is clear and moist. No oropharyngeal exudate.  +mallapati III  Eyes: Pupils are  equal, round, and reactive to light.  Neck: Normal range of motion. Neck supple. No JVD present.  Cardiovascular: Normal rate, regular rhythm and normal heart sounds.  Pulmonary/Chest: Effort normal and breath sounds normal. No accessory muscle usage. No respiratory distress. She has no decreased breath sounds. She has no wheezes. She has no rhonchi.  Slight barrel chest   Abdominal: Soft. Bowel sounds are normal. There is no tenderness.  Musculoskeletal: Normal range of motion. She exhibits edema (2-3+ LE edema).  Lymphadenopathy:    She has no cervical adenopathy.  Neurological: She is alert and oriented to person, place, and time. Gait normal.  Skin: Skin is warm and dry. She is not diaphoretic. No erythema.  Psychiatric: Mood, memory, affect and judgment normal.  Nursing note and vitals reviewed.    Lab Results:  CBC    Component Value Date/Time   WBC 5.7 07/03/2016 0656   RBC 3.14 (L) 07/03/2016 0656   HGB 9.4 (L) 07/03/2016 0656   HCT 27.6 (L) 07/03/2016 0656   PLT 194 07/03/2016 0656   MCV 87.9 07/03/2016 0656   MCH 29.9 07/03/2016 0656   MCHC 34.1 07/03/2016 0656   RDW 14.5 07/03/2016 0656   LYMPHSABS 1.2 07/02/2016 0924   MONOABS 0.4 07/02/2016 0924   EOSABS 0.0 07/02/2016 0924   BASOSABS 0.0 07/02/2016 0924    BMET    Component Value Date/Time   NA 134 (L) 07/04/2016 0520   K 4.6 07/04/2016 0520   CL 104 07/04/2016 0520   CO2 24 07/04/2016 0520   GLUCOSE 87 07/04/2016 0520   BUN 59 (H) 07/04/2016 0520   CREATININE 3.45  (H) 07/04/2016 0520   CALCIUM 10.1 07/04/2016 0520   GFRNONAA 12 (L) 07/04/2016 0520   GFRAA 14 (L) 07/04/2016 0520    BNP    Component Value Date/Time   BNP 47.2 06/15/2016 1509    ProBNP    Component Value Date/Time   PROBNP 76.0 12/19/2012 1100    Assessment & Plan:   82 year old female patient presenting today for shortness of breath for acute visit.  Unfortunately patient has had nonadherence to multiple aspects of her plan of care.  Patient to wear 2 L of oxygen anytime her oxygenation drops below 90%, with exertion and when sleeping.  Patient to have an appointment with Dr. Ander Slade for sleep consult as I suspect there may be obstructive sleep apnea.  Patient also with elevated PAP pressures on previous 2 echocardiograms.  Mallampati 3 on exam.   Patient follow-up with primary care regarding blood pressure medications.  Patient also needs to follow stricter diet for GERD as well as low sodium.  Information provided to patient today.  Patient also need to start weighing herself daily to track weights.  Patient to apply lower extremity compression stockings and elevate feet.  For her chronic 2-3+ edema.   Unfortunately we do not have any Bevespi samples today.  Could consider Bevespi in the future.  We will continue patient on Symbicort 160.  Will order pulmonary function test to further evaluate COPD and persistent dyspnea.  We will have patient start omeprazole 20 mg daily.  Patient to take 30 minutes to an hour before first meal the day.  Patient reports that she will do this.  Patient also provided information on GERD diet and things to avoid that can exacerbate GERD.  Essential hypertension I would like for you to weigh daily in the morning >>> Go to the bathroom prior to weigh >>> Record your  weights  You must take your furosemide daily as prescribed >>>Do not take half doses of your furosemide or skip doses  Follow low-sodium diet review the literature below  Contact  PCP regarding BP  >>> notify them that you have not been taking clonidine or hydralazine    Chronic diastolic congestive heart failure ([place]) I would like for you to weigh daily in the morning >>> Go to the bathroom prior to weigh >>> Record your weights  You must take your furosemide daily as prescribed >>>Do not take half doses of your furosemide or skip doses  Follow low-sodium diet review the literature below    COPD GOLD II We will order a pulmonary function test for you to complete before next office visit with Dr. Rexene Edison need to wear your oxygen 2 L anytime your oxygenation is below 90% AND with exertion, at night >>> Oxygen 2 L  Continue Symbicort 160 >>> 2 puffs in the morning right when you wake up, rinse out your mouth after use, 12 hours later 2 puffs, rinse after use >>> Take this daily, no matter what >>> This is not a rescue inhaler   Keep follow-up with our office in November/2019 >>>You can contact us and be seen sooner if symptoms worsen    GERD (gastroesophageal reflux disease) We will start omeprazole 20mg   >>>take in the AM 54min to 1 hour before breakfast  >>> Review information regarding GERD/heartburn listed below >>>Review diet information for GERD below      DOE (dyspnea on exertion) We will order a pulmonary function test for you to complete before next office visit with Dr. Julaine Hua is 7 >>> We will have you see 1 of our sleep doctors Dr. Ander Slade  You need to wear your oxygen 2 L anytime your oxygenation is below 90% AND with exertion, at night >>> Oxygen 2 L  Continue Symbicort 160 >>> 2 puffs in the morning right when you wake up, rinse out your mouth after use, 12 hours later 2 puffs, rinse after use >>> Take this daily, no matter what >>> This is not a rescue inhaler   Keep follow-up with our office in November/2019 >>>You can contact us and be seen sooner if symptoms worsen    Chronic respiratory failure with  hypoxia ([place]) We will order a pulmonary function test for you to complete before next office visit with Dr. Julaine Hua is 7 >>> We will have you see 1 of our sleep doctors Dr. Ander Slade  You need to wear your oxygen 2 L anytime your oxygenation is below 90% AND with exertion, at night >>> Oxygen 2 L >>> Purchase new SPO2 monitor in order to monitor oxygenation appropriately>>> if below 90 percent then apply oxygen  Samples provided of nasal saline gel for patient to apply to nostrils to help with dryness  Keep follow-up with our office in November/2019 >>>You can contact us and be seen sooner if symptoms worsen    This appointment was 45 minutes along with her 50% that time in direct face-to-face patient care, assessment, plan of care follow-up and discussion with patient as well as patient's daughter.   Lauraine Rinne, NP 04/07/2018

## 2018-04-07 ENCOUNTER — Ambulatory Visit (INDEPENDENT_AMBULATORY_CARE_PROVIDER_SITE_OTHER): Payer: Medicare Other | Admitting: Pulmonary Disease

## 2018-04-07 ENCOUNTER — Encounter: Payer: Self-pay | Admitting: Pulmonary Disease

## 2018-04-07 VITALS — BP 144/72 | HR 89 | Temp 98.3°F | Ht 64.0 in | Wt 186.2 lb

## 2018-04-07 DIAGNOSIS — K219 Gastro-esophageal reflux disease without esophagitis: Secondary | ICD-10-CM

## 2018-04-07 DIAGNOSIS — I5032 Chronic diastolic (congestive) heart failure: Secondary | ICD-10-CM

## 2018-04-07 DIAGNOSIS — J449 Chronic obstructive pulmonary disease, unspecified: Secondary | ICD-10-CM | POA: Diagnosis not present

## 2018-04-07 DIAGNOSIS — I10 Essential (primary) hypertension: Secondary | ICD-10-CM | POA: Diagnosis not present

## 2018-04-07 DIAGNOSIS — R0609 Other forms of dyspnea: Secondary | ICD-10-CM

## 2018-04-07 DIAGNOSIS — J9611 Chronic respiratory failure with hypoxia: Secondary | ICD-10-CM

## 2018-04-07 MED ORDER — OMEPRAZOLE 20 MG PO CPDR: 20.0000 mg | DELAYED_RELEASE_CAPSULE | Freq: Every day | ORAL | 4 refills | Status: DC

## 2018-04-07 NOTE — Progress Notes (Signed)
Chart and office note reviewed in detail  > agree with a/p as outlined    

## 2018-04-07 NOTE — Assessment & Plan Note (Signed)
I would like for you to weigh daily in the morning >>> Go to the bathroom prior to weigh >>> Record your weights  You must take your furosemide daily as prescribed >>>Do not take half doses of your furosemide or skip doses  Follow low-sodium diet review the literature below

## 2018-04-07 NOTE — Patient Instructions (Addendum)
We will order a pulmonary function test for you to complete before next office visit with Dr. Julaine Hua is 7 >>> We will have you see 1 of our sleep doctors Dr. Ander Slade  I would like for you to weigh daily in the morning >>> Go to the bathroom prior to weigh >>> Record your weights  You must take your furosemide daily as prescribed >>>Do not take half doses of your furosemide or skip doses  Follow low-sodium diet review the literature below  You need to wear your oxygen 2 L anytime your oxygenation is below 90% AND with exertion, at night >>> Oxygen 2 L  We will start omeprazole 20mg   >>>take in the AM 21min to 1 hour before breakfast  >>> Review information regarding GERD/heartburn listed below >>>Review diet information for GERD below  Continue Symbicort 160 >>> 2 puffs in the morning right when you wake up, rinse out your mouth after use, 12 hours later 2 puffs, rinse after use >>> Take this daily, no matter what >>> This is not a rescue inhaler   Contact PCP regarding BP  >>> notify them that you have not been taking clonidine or hydralazine   Keep follow-up with our office in November/2019 >>>You can contact us and be seen sooner if symptoms worsen    Our new address and phone number will be:  [address] [place], [place] [address] Telephone number: [phone]    It is flu season:   >>>Remember to be washing your hands regularly, using hand sanitizer, be careful to use around herself with has contact with people who are sick will increase her chances of getting sick yourself. >>> Best ways to protect herself from the flu: Receive the yearly flu vaccine, practice good hand hygiene washing with soap and also using hand sanitizer when available, eat a nutritious meals, get adequate rest, hydrate appropriately   Please contact the office if your symptoms worsen or you have concerns that you are not improving.   Thank you for choosing [facility] for your healthcare, and for allowing [place] to partner with you on your healthcare journey. I am thankful to be able to provide care to you today.   Wyn Quaker FNP-C     DASH Eating Plan DASH stands for "Dietary Approaches to Stop Hypertension." The DASH eating plan is a healthy eating plan that has been shown to reduce high blood pressure (hypertension). It may also reduce your risk for type 2 diabetes, heart disease, and stroke. The DASH eating plan may also help with weight loss. What are tips for following this plan? General guidelines  Avoid eating more than 2,300 mg (milligrams) of salt (sodium) a day. If you have hypertension, you may need to reduce your sodium intake to 1,500 mg a day.  Limit alcohol intake to no more than 1 drink a day for nonpregnant women and 2 drinks a day for men. One drink equals 12 oz of beer, 5 oz of wine, or 1 oz of hard liquor.  Work with your health care provider to maintain a healthy body weight or to lose weight. Ask what an ideal weight is for you.  Get at least 30 minutes of exercise that causes your heart to beat faster (aerobic exercise) most days of the week. Activities may include walking, swimming, or biking.  Work with your health care provider or diet and nutrition specialist (dietitian) to adjust your eating plan to your individual calorie needs.  Reading food labels  Check food labels for the amount of sodium per serving. Choose foods with less than 5 percent of the Daily Value of sodium. Generally, foods with less than 300 mg of sodium per serving fit into this eating plan.  To find whole grains, look for the word "whole" as the first word in the ingredient list. Shopping  Buy products labeled as "low-sodium" or "no salt added."  Buy fresh foods. Avoid canned foods and premade or frozen meals. Cooking  Avoid adding salt when cooking. Use salt-free seasonings or herbs instead of table salt or sea salt. Check with your  health care provider or pharmacist before using salt substitutes.  Do not fry foods. Cook foods using healthy methods such as baking, boiling, grilling, and broiling instead.  Cook with heart-healthy oils, such as olive, canola, soybean, or sunflower oil. Meal planning   Eat a balanced diet that includes: ? 5 or more servings of fruits and vegetables each day. At each meal, try to fill half of your plate with fruits and vegetables. ? Up to 6-8 servings of whole grains each day. ? Less than 6 oz of lean meat, poultry, or fish each day. A 3-oz serving of meat is about the same size as a deck of cards. One egg equals 1 oz. ? 2 servings of low-fat dairy each day. ? A serving of nuts, seeds, or beans 5 times each week. ? Heart-healthy fats. Healthy fats called Omega-3 fatty acids are found in foods such as flaxseeds and coldwater fish, like sardines, salmon, and mackerel.  Limit how much you eat of the following: ? Canned or prepackaged foods. ? Food that is high in trans fat, such as fried foods. ? Food that is high in saturated fat, such as fatty meat. ? Sweets, desserts, sugary drinks, and other foods with added sugar. ? Full-fat dairy products.  Do not salt foods before eating.  Try to eat at least 2 vegetarian meals each week.  Eat more home-cooked food and less restaurant, buffet, and fast food.  When eating at a restaurant, ask that your food be prepared with less salt or no salt, if possible. What foods are recommended? The items listed may not be a complete list. Talk with your dietitian about what dietary choices are best for you. Grains Whole-grain or whole-wheat bread. Whole-grain or whole-wheat pasta. Brown rice. [place]. Bulgur. Whole-grain and low-sodium cereals. Pita bread. Low-fat, low-sodium crackers. Whole-wheat flour tortillas. Vegetables Fresh or frozen vegetables (raw, steamed, roasted, or grilled). Low-sodium or reduced-sodium tomato and vegetable juice.  Low-sodium or reduced-sodium tomato sauce and tomato paste. Low-sodium or reduced-sodium canned vegetables. Fruits All fresh, dried, or frozen fruit. Canned fruit in natural juice (without added sugar). Meat and other protein foods Skinless chicken or [place]. Ground chicken or [place]. Pork with fat trimmed off. Fish and seafood. Egg whites. Dried beans, peas, or lentils. Unsalted nuts, nut butters, and seeds. Unsalted canned beans. Lean cuts of beef with fat trimmed off. Low-sodium, lean deli meat. Dairy Low-fat (1%) or fat-free (skim) milk. Fat-free, low-fat, or reduced-fat cheeses. Nonfat, low-sodium ricotta or cottage cheese. Low-fat or nonfat yogurt. Low-fat, low-sodium cheese. Fats and oils Soft margarine without trans fats. Vegetable oil. Low-fat, reduced-fat, or light mayonnaise and salad dressings (reduced-sodium). Canola, safflower, olive, soybean, and sunflower oils. Avocado. Seasoning and other foods Herbs. Spices. Seasoning mixes without salt. Unsalted popcorn and pretzels. Fat-free sweets. What foods are not recommended? The items listed may not be a complete list.  Talk with your dietitian about what dietary choices are best for you. Grains Baked goods made with fat, such as croissants, muffins, or some breads. Dry pasta or rice meal packs. Vegetables Creamed or fried vegetables. Vegetables in a cheese sauce. Regular canned vegetables (not low-sodium or reduced-sodium). Regular canned tomato sauce and paste (not low-sodium or reduced-sodium). Regular tomato and vegetable juice (not low-sodium or reduced-sodium). Angie Fava. Olives. Fruits Canned fruit in a light or heavy syrup. Fried fruit. Fruit in cream or butter sauce. Meat and other protein foods Fatty cuts of meat. Ribs. Fried meat. Berniece Salines. Sausage. Bologna and other processed lunch meats. Salami. Fatback. Hotdogs. Bratwurst. Salted nuts and seeds. Canned beans with added salt. Canned or smoked fish. Whole eggs or egg yolks. Chicken  or [place] with skin. Dairy Whole or 2% milk, cream, and half-and-half. Whole or full-fat cream cheese. Whole-fat or sweetened yogurt. Full-fat cheese. Nondairy creamers. Whipped toppings. Processed cheese and cheese spreads. Fats and oils Butter. Stick margarine. Lard. Shortening. Ghee. Bacon fat. Tropical oils, such as coconut, palm kernel, or palm oil. Seasoning and other foods Salted popcorn and pretzels. Onion salt, garlic salt, seasoned salt, table salt, and sea salt. Worcestershire sauce. Tartar sauce. Barbecue sauce. Teriyaki sauce. Soy sauce, including reduced-sodium. Steak sauce. Canned and packaged gravies. Fish sauce. Oyster sauce. Cocktail sauce. Horseradish that you find on the shelf. Ketchup. Mustard. Meat flavorings and tenderizers. Bouillon cubes. Hot sauce and Tabasco sauce. Premade or packaged marinades. Premade or packaged taco seasonings. Relishes. Regular salad dressings. Where to find more information:  National Heart, Lung, and [place]: https://wilson-eaton.com/  American Heart Association: www.heart.org Summary  The DASH eating plan is a healthy eating plan that has been shown to reduce high blood pressure (hypertension). It may also reduce your risk for type 2 diabetes, heart disease, and stroke.  With the DASH eating plan, you should limit salt (sodium) intake to 2,300 mg a day. If you have hypertension, you may need to reduce your sodium intake to 1,500 mg a day.  When on the DASH eating plan, aim to eat more fresh fruits and vegetables, whole grains, lean proteins, low-fat dairy, and heart-healthy fats.  Work with your health care provider or diet and nutrition specialist (dietitian) to adjust your eating plan to your individual calorie needs. This information is not intended to replace advice given to you by your health care provider. Make sure you discuss any questions you have with your health care provider. Document Released: 06/07/2011 Document Revised:  06/11/2016 Document Reviewed: 06/11/2016 Elsevier Interactive Patient Education  2018 [place].    Gastroesophageal Reflux Disease, Adult Normally, food travels down the esophagus and stays in the stomach to be digested. If a person has gastroesophageal reflux disease (GERD), food and stomach acid move back up into the esophagus. When this happens, the esophagus becomes sore and swollen (inflamed). Over time, GERD can make small holes (ulcers) in the lining of the esophagus. Follow these instructions at home: Diet  Follow a diet as told by your doctor. You may need to avoid foods and drinks such as: ? Coffee and tea (with or without caffeine). ? Drinks that contain alcohol. ? Energy drinks and sports drinks. ? Carbonated drinks or sodas. ? Chocolate and cocoa. ? Peppermint and mint flavorings. ? Garlic and onions. ? Horseradish. ? Spicy and acidic foods, such as peppers, chili powder, curry powder, vinegar, hot sauces, and BBQ sauce. ? Citrus fruit juices and citrus fruits, such as oranges, lemons, and limes. ? Tomato-based  foods, such as red sauce, chili, salsa, and pizza with red sauce. ? Fried and fatty foods, such as donuts, french fries, potato chips, and high-fat dressings. ? High-fat meats, such as hot dogs, rib eye steak, sausage, ham, and bacon. ? High-fat dairy items, such as whole milk, butter, and cream cheese.  Eat small meals often. Avoid eating large meals.  Avoid drinking large amounts of liquid with your meals.  Avoid eating meals during the 2-3 hours before bedtime.  Avoid lying down right after you eat.  Do not exercise right after you eat. General instructions  Pay attention to any changes in your symptoms.  Take over-the-counter and prescription medicines only as told by your doctor. Do not take aspirin, ibuprofen, or other NSAIDs unless your doctor says it is okay.  Do not use any tobacco products, including cigarettes, chewing tobacco, and  e-cigarettes. If you need help quitting, ask your doctor.  Wear loose clothes. Do not wear anything tight around your waist.  Raise (elevate) the head of your bed about 6 inches (15 cm).  Try to lower your stress. If you need help doing this, ask your doctor.  If you are overweight, lose an amount of weight that is healthy for you. Ask your doctor about a safe weight loss goal.  Keep all follow-up visits as told by your doctor. This is important. Contact a doctor if:  You have new symptoms.  You lose weight and you do not know why it is happening.  You have trouble swallowing, or it hurts to swallow.  You have wheezing or a cough that keeps happening.  Your symptoms do not get better with treatment.  You have a hoarse voice. Get help right away if:  You have pain in your arms, neck, jaw, teeth, or back.  You feel sweaty, dizzy, or light-headed.  You have chest pain or shortness of breath.  You throw up (vomit) and your throw up looks like blood or coffee grounds.  You pass out (faint).  Your poop (stool) is bloody or black.  You cannot swallow, drink, or eat. This information is not intended to replace advice given to you by your health care provider. Make sure you discuss any questions you have with your health care provider. Document Released: 12/05/2007 Document Revised: 11/24/2015 Document Reviewed: 10/13/2014 Elsevier Interactive Patient Education  2018 [place] for Gastroesophageal Reflux Disease, Adult When you have gastroesophageal reflux disease (GERD), the foods you eat and your eating habits are very important. Choosing the right foods can help ease your discomfort. What guidelines do I need to follow?  Choose fruits, vegetables, whole grains, and low-fat dairy products.  Choose low-fat meat, fish, and poultry.  Limit fats such as oils, salad dressings, butter, nuts, and avocado.  Keep a food diary. This helps you identify foods  that cause symptoms.  Avoid foods that cause symptoms. These may be different for everyone.  Eat small meals often instead of 3 large meals a day.  Eat your meals slowly, in a place where you are relaxed.  Limit fried foods.  Cook foods using methods other than frying.  Avoid drinking alcohol.  Avoid drinking large amounts of liquids with your meals.  Avoid bending over or lying down until 2-3 hours after eating. What foods are not recommended? These are some foods and drinks that may make your symptoms worse: Vegetables Tomatoes. Tomato juice. Tomato and spaghetti sauce. Chili peppers. Onion and garlic. Horseradish. Fruits Oranges,  grapefruit, and lemon (fruit and juice). Meats High-fat meats, fish, and poultry. This includes hot dogs, ribs, ham, sausage, salami, and bacon. Dairy Whole milk and chocolate milk. Sour cream. Cream. Butter. Ice cream. Cream cheese. Drinks Coffee and tea. Bubbly (carbonated) drinks or energy drinks. Condiments Hot sauce. Barbecue sauce. Sweets/Desserts Chocolate and cocoa. Donuts. Peppermint and spearmint. Fats and Oils High-fat foods. This includes [place] fries and potato chips. Other Vinegar. Strong spices. This includes black pepper, white pepper, red pepper, cayenne, curry powder, cloves, ginger, and chili powder. The items listed above may not be a complete list of foods and drinks to avoid. Contact your dietitian for more information. This information is not intended to replace advice given to you by your health care provider. Make sure you discuss any questions you have with your health care provider. Document Released: 12/18/2011 Document Revised: 11/24/2015 Document Reviewed: 04/22/2013 Elsevier Interactive Patient Education  2017 [place] Oxygen Use, Adult When a medical condition keeps you from getting enough oxygen, your health care provider may instruct you to take extra oxygen at home. Your health care provider will  let you know:  When to take oxygen.  For how long to take oxygen.  How quickly oxygen should be delivered (flow rate), in liters per minute (LPM or L/M).  Home oxygen can be given through:  A mask.  A nasal cannula. This is a device or tube that goes in the nostrils.  A transtracheal catheter. This is a small, flexible tube placed in the trachea.  A tracheostomy. This is a surgically made opening in the trachea.  These devices are connected with tubing to an oxygen source, such as:  A tank. Tanks hold oxygen in gas form. They must be replaced when the oxygen is used up.  A liquid oxygen device. This holds oxygen in liquid form. It must be replaced when the oxygen is used up.  An oxygen concentrator machine. This filters oxygen in the room. It uses electricity, so you must have a backup cylinder of oxygen in case the power goes out.  Supplies needed: To use oxygen, you will need:  A mask, nasal cannula, transtracheal catheter, or tracheostomy.  An oxygen tank, a liquid oxygen device, or an oxygen concentrator.  The tape that your health care provider recommends (optional).  If you use a transtracheal catheter and your prescribed flow rate is 1 LPM or greater, you will also need a humidifier. Risks and complications  Fire. This can happen if the oxygen is exposed to a heat source, flame, or spark.  Injury to skin. This can happen if liquid oxygen touches your skin.  Organ damage. This can happen if you get too little oxygen. How to use oxygen Your health care provider will show you how to use your oxygen device. Follow her or his instructions. They may look something like this: 1. Wash your hands. 2. If you use an oxygen concentrator, make sure it is plugged in. 3. Place one end of the tube into the port on the tank, device, or machine. 4. Place the mask over your nose and mouth. Or, place the nasal cannula and secure it with tape if instructed. If you use a tracheostomy  or transtracheal catheter, connect it to the oxygen source as directed. 5. Make sure the liter-flow setting on the machine is at the level prescribed by your health care provider. 6. Turn on the machine or adjust the knob on the tank or device to the  correct liter-flow setting. 7. When you are done, turn off and unplug the machine, or turn the knob to OFF.  How to clean and care for the oxygen supplies Nasal cannula  Clean it with a warm, wet cloth daily or as needed.  Wash it with a liquid soap once a week.  Rinse it thoroughly once or twice a week.  Replace it every 2-4 weeks.  If you have an infection, such as a cold or pneumonia, change the cannula when you get better. Mask  Replace it every 2-4 weeks.  If you have an infection, such as a cold or pneumonia, change the mask when you get better. Humidifier bottle  Wash the bottle between each refill: ? Wash it with soap and warm water. ? Rinse it thoroughly. ? Disinfect it and its top. ? Air-dry it.  Make sure it is dry before you refill it. Oxygen concentrator  Clean the air filter at least twice a week according to directions from your home medical equipment and service company.  Wipe down the cabinet every day. To do this: ? Unplug the unit. ? Wipe down the cabinet with a damp cloth. ? Dry the cabinet. Other equipment  Change any extra tubing every 1-3 months.  Follow instructions from your health care provider about taking care of any other equipment. Safety tips Fire safety tips   Keep your oxygen and oxygen supplies at least 5 ft away from sources of heat, flames, and sparks at all times.  Do not allow smoking near your oxygen. Put up "no smoking" signs in your home.  Do not use materials that can burn (are flammable) while you use oxygen.  When you go to a restaurant with portable oxygen, ask to be seated in the nonsmoking section.  Keep a Data processing manager close by. Let your fire department know that  you have oxygen in your home.  Test your home smoke detectors regularly. General safety tips  If you use an oxygen cylinder, make sure it is in a stand or secured to an object that will not move (fixed object).  If you use liquid oxygen, make sure its container is kept upright.  If you use an oxygen concentrator: ? [employer] movement psychotherapist company. Make sure you are given priority service in the event that your power goes out. ? Avoid using extension cords, if possible. Follow these instructions at home:  Use oxygen only as told by your health care provider.  Do not use alcohol or other drugs that make you relax (sedating drugs) unless instructed. They can slow down your breathing rate and make it hard to get in enough oxygen.  Know how and when to order a refill of oxygen.  Always keep a spare tank of oxygen. Plan ahead for holidays when you may not be able to get a prescription filled.  Use water-based lubricants on your lips or nostrils. Do not use oil-based products like petroleum jelly.  To prevent skin irritation on your cheeks or behind your ears, tuck some gauze under the tubing. Contact a health care provider if:  You get headaches often.  You have shortness of breath.  You have a lasting cough.  You have anxiety.  You are sleepy all the time.  You develop an illness that affects your breathing.  You cannot exercise at your regular level.  You are restless.  You have difficult or irregular breathing, and it is getting worse.  You have a fever.  You have persistent  redness under your nose. Get help right away if:  You are confused.  You have blue lips or fingernails.  You are struggling to breathe. This information is not intended to replace advice given to you by your health care provider. Make sure you discuss any questions you have with your health care provider. Document Released: 09/08/2003 Document Revised: 02/15/2016 Document Reviewed:  01/10/2016 Elsevier Interactive Patient Education  [employer].

## 2018-04-07 NOTE — Assessment & Plan Note (Signed)
I would like for you to weigh daily in the morning >>> Go to the bathroom prior to weigh >>> Record your weights  You must take your furosemide daily as prescribed >>>Do not take half doses of your furosemide or skip doses  Follow low-sodium diet review the literature below  Contact PCP regarding BP  >>> notify them that you have not been taking clonidine or hydralazine

## 2018-04-07 NOTE — Assessment & Plan Note (Signed)
We will order a pulmonary function test for you to complete before next office visit with Dr. Julaine Hua is 7 >>> We will have you see 1 of our sleep doctors Dr. Ander Slade  You need to wear your oxygen 2 L anytime your oxygenation is below 90% AND with exertion, at night >>> Oxygen 2 L  Continue Symbicort 160 >>> 2 puffs in the morning right when you wake up, rinse out your mouth after use, 12 hours later 2 puffs, rinse after use >>> Take this daily, no matter what >>> This is not a rescue inhaler   Keep follow-up with our office in November/2019 >>>You can contact us and be seen sooner if symptoms worsen

## 2018-04-07 NOTE — Assessment & Plan Note (Addendum)
We will order a pulmonary function test for you to complete before next office visit with Dr. Julaine Hua is 7 >>> We will have you see 1 of our sleep doctors Dr. Ander Slade  You need to wear your oxygen 2 L anytime your oxygenation is below 90% AND with exertion, at night >>> Oxygen 2 L >>> Purchase new SPO2 monitor in order to monitor oxygenation appropriately>>> if below 90 percent then apply oxygen  Samples provided of nasal saline gel for patient to apply to nostrils to help with dryness  Keep follow-up with our office in November/2019 >>>You can contact us and be seen sooner if symptoms worsen

## 2018-04-07 NOTE — Assessment & Plan Note (Signed)
We will start omeprazole 20mg   >>>take in the AM 56min to 1 hour before breakfast  >>> Review information regarding GERD/heartburn listed below >>>Review diet information for GERD below

## 2018-04-07 NOTE — Assessment & Plan Note (Signed)
We will order a pulmonary function test for you to complete before next office visit with Dr. Rexene Edison need to wear your oxygen 2 L anytime your oxygenation is below 90% AND with exertion, at night >>> Oxygen 2 L  Continue Symbicort 160 >>> 2 puffs in the morning right when you wake up, rinse out your mouth after use, 12 hours later 2 puffs, rinse after use >>> Take this daily, no matter what >>> This is not a rescue inhaler   Keep follow-up with our office in November/2019 >>>You can contact us and be seen sooner if symptoms worsen

## 2018-04-24 ENCOUNTER — Institutional Professional Consult (permissible substitution): Payer: Medicare Other | Admitting: Pulmonary Disease

## 2018-05-12 ENCOUNTER — Ambulatory Visit: Payer: Medicare Other | Admitting: Internal Medicine

## 2018-05-19 ENCOUNTER — Ambulatory Visit (INDEPENDENT_AMBULATORY_CARE_PROVIDER_SITE_OTHER): Payer: Medicare Other | Admitting: Internal Medicine

## 2018-05-19 ENCOUNTER — Ambulatory Visit: Payer: Medicare Other | Admitting: Internal Medicine

## 2018-05-19 ENCOUNTER — Encounter: Payer: Self-pay | Admitting: Internal Medicine

## 2018-05-19 VITALS — BP 168/68 | HR 92 | Ht 63.0 in | Wt 177.0 lb

## 2018-05-19 DIAGNOSIS — R0609 Other forms of dyspnea: Secondary | ICD-10-CM

## 2018-05-19 DIAGNOSIS — J9611 Chronic respiratory failure with hypoxia: Secondary | ICD-10-CM | POA: Diagnosis not present

## 2018-05-19 DIAGNOSIS — J449 Chronic obstructive pulmonary disease, unspecified: Secondary | ICD-10-CM

## 2018-05-19 LAB — PULMONARY FUNCTION TEST: DL/VA % PRED: 47 %

## 2018-05-19 MED ORDER — GLYCOPYRROLATE-FORMOTEROL 9-4.8 MCG/ACT IN AERO: 2.0000 | INHALATION_SPRAY | Freq: Two times a day (BID) | RESPIRATORY_TRACT | 0 refills | Status: DC

## 2018-05-19 MED ORDER — GLYCOPYRROLATE-FORMOTEROL 9-4.8 MCG/ACT IN AERO: 2.0000 | INHALATION_SPRAY | Freq: Two times a day (BID) | RESPIRATORY_TRACT | 11 refills | Status: DC

## 2018-05-19 NOTE — Progress Notes (Signed)
PFT done today. 

## 2018-05-19 NOTE — Assessment & Plan Note (Signed)
-   PFT's 11/10/2012 FEV1 0.85 (52%)ratio 56 and no better p B 2 with DLCO 34 corrects to 60 - doe x 5-10 min stationery bike > add tudorza 11/10/12 > d/c 05/2013 and no change - PFT's  05/19/2018  FEV1 0.85 (56 % ) ratio 58  p 8 % improvement from saba p symb  prior to study with DLCO  27 % corrects to 47   % for alv volume  And ERV = 0.22  - 05/19/2018  After extensive coaching inhaler device,  effectiveness = 75% (short Ti)      Pt is Group B in terms of symptom/risk and laba/lama therefore appropriate rx at this point> try bevespi 2bid since same technique as symbicort with goal of transferring from curb to building and able to do housework easier but with emphasis on wearing 02 as rec consistently.     Reviewed Formulary restrictions will be an ongoing challenge for the forseable future and I would be happy to pick an alternative if the pt will first  provide me a list of them but pt  will need to return here for training for any new device that is required eg dpi vs hfa vs respimat.    In meantime we can always provide samples so the patient never runs out of any needed respiratory medications.

## 2018-05-19 NOTE — Assessment & Plan Note (Signed)
04/19/14 walked one lap dropped to 80% required 4lpm with walking to maintain sats  ono RA 04/21/14 desat x 7 h x 12 m at < 89%  04/26/2014 rec 2lpm and repeat ono on 2lpm  - 01/10/2015  Walked 2lpmx 2 laps @ 185 ft each stopped due to hip pain, slow pace/ no sob or desat  -Patient Saturations on Room Air at Rest = 95% Patient Saturations on [employer] while Ambulating = 87% Patient Saturations on 2 Liters of oxygen while Ambulating = 97%   - 05/19/2018   Walked 2lpm POC x one lap @ 210 ft  stopped due to sob s desats     Adequate control on present rx, reviewed in detail with pt > no change in rx needed  ? Needs pulmonary rehab?

## 2018-05-19 NOTE — Assessment & Plan Note (Signed)
-   Try off acei effective 10/01/2012  - Echo 12/29/12 Doppler parameters are consistent with abnormal left ventricular relaxation (grade 1 diastolic Dysfunction). - Echo 02/14/2018 - Left ventricle: The cavity size was normal. Wall thickness was increased in a pattern of mild LVH. Systolic function was mildly reduced. The estimated ejection fraction was in the range of 45% to 50%. Diffuse hypokinesis. Doppler parameters are consistent with abnormal left ventricular relaxation (grade 1 diastolic dysfunction). - Aortic valve: Trileaflet; mildly thickened, mildly calcified leaflets. - Left atrium: The atrium was mildly dilated - Pulmonary arteries: Systolic pressure was moderately increased. PA peak pressure: 48 mm Hg (S)   She likely has mild diastolic dysfunction and cor pulmonale with main focus on treating bp/ copd/hypoxemia for now    I had an extended discussion with the patient reviewing all relevant studies completed to date and  lasting 15 to 20 minutes of a 25 minute visit    See device teaching which extended face to face time for this visit.  Each maintenance medication was reviewed in detail including emphasizing most importantly the difference between maintenance and prns and under what circumstances the prns are to be triggered using an action plan format that is not reflected in the computer generated alphabetically organized AVS which I have not found useful in most complex patients, especially with respiratory illnesses  Please see AVS for specific instructions unique to this visit that I personally wrote and verbalized to the the pt in detail and then reviewed with pt  by my nurse highlighting any  changes in therapy recommended at today's visit to their plan of care.

## 2018-05-19 NOTE — Patient Instructions (Addendum)
Plan A = Automatic = change symbicort to Bevespi Take 2 puffs first thing in am and then another 2 puffs about 12 hours later.   Work on Engineer, technical sales technique:  relax and gently blow all the way out then take a nice smooth deep breath back in, triggering the inhaler at same time you start breathing in.  Hold for up to 5 seconds if you can. Blow out thru nose. Rinse and gargle with water when done   Plan B = Backup Only use your albuterol inhaler as a rescue medication to be used if you can't catch your breath by resting or doing a relaxed purse lip breathing pattern.  - The less you use it, the better it will work when you need it. - Ok to use the inhaler up to 2 puffs  every 4 hours if you must but call for appointment if use goes up over your usual need - Don't leave home without it !!  (think of it like the spare tire for your car)    Please schedule a follow up visit in 3 months but call sooner if needed  with all medications /inhalers/ solutions in hand so we can verify exactly what you are taking. This includes all medications from all doctors and over the counters

## 2018-05-19 NOTE — Progress Notes (Signed)
Discussed results with Dr. Melvyn Novas at office visit today.  Nothing further is needed at this time.  Wyn Quaker FNP

## 2018-05-19 NOTE — Progress Notes (Signed)
Subjective:    Patient ID: Mallory Chavez, female    DOB: 07-04-1935 MRN: [id number]  Brief patient profile:  67 yowbf quit smoking around 1994 bothered by some seasonal rhinitis spring > fall x around 1990 then 2012 developed breathing problems dx asthma variably responsive to rx referred 10/01/2012 to pulmonary clinic by Dr Jeanie Cooks and proved to have GOLD II COPD 10/2012    History of Present Illness  10/01/2012 1st pulmonary /Jimy Gates  On ACEi cc abruptly 2012 shortness of breath assoc with cough min productive mostly clear. Sob x across a parking lot. Not responding to multiple asthma meds. rec benicar 40/25 one half daily in place of lisnopril GERD diet    04/19/2014 acute  ov/Khaliel Morey re: GOLD II  copd/ no med calendar/ not really clear on how to use meds  Chief Complaint  Patient presents with  . Acute Visit    Pt c/o increased SOB and wheezing x 2 wks.  She is SOB mainly with exertion such as walking from room to room at home. She is using her proair 2-3 times per day.   baseline doe/fatigue [employer] parking/ [name] or [employer]  Now sob across room  Nose runs some, clariton helps  Only using symbicort once a day  Onset of worsening was indolent/progressive, some better p saba  >>Increase Symbicort  To 160 , and O2 at 4l/m with act , ONO ordered      02/06/2018  f/u ov/Nasiah Polinsky re:  GOLD II/ 02 2lpm hs and prn daytime  Chief Complaint  Patient presents with  . Follow-up    Increased SOB x 1 month. She has been using her albuterol inhaler 2 x daily on average.   Dyspnea:  Uses [employer] parking / struggles to do [employer] on 2lpm. Gradually worse since weather turned hot  Cough: none  SABA use: as above  02: 2lpm does not titrate  rec No change in pulmonary medications Adjust your 02 to keep your saturations above 90% at all times Please schedule a follow up visit in 3 months but call sooner if needed  - add:  Echo needed to r/o cor pulmonale  - Echo 02/14/2018 - Left ventricle: The  cavity size was normal. Wall thickness was increased in a pattern of mild LVH. Systolic function was mildly reduced. The estimated ejection fraction was in the range of 45% to 50%. Diffuse hypokinesis. Doppler parameters are consistent with abnormal left ventricular relaxation (grade 1 diastolic dysfunction). - Aortic valve: Trileaflet; mildly thickened, mildly calcified leaflets. - Left atrium: The atrium was mildly dilated. - Pulmonary arteries: Systolic pressure was moderately increased. PA peak pressure: 48 mm Hg (S).     05/19/2018  f/u ov/Nichalas Coin re:  GOLD II copd criteria/ 02 dep 2lpm 24/7  Chief Complaint  Patient presents with  . Follow-up    PFT's done today.  Breathing is unchanged since her last visit. She is using her proair inhaler 1-2 x per day on average.   Dyspnea:  Much better on 02 and able to do housekeeping/walk at [facility] or [employer], but struggles to get in to buildings carrying 02 across parking lot despite adequate sats on POC Cough: none Sleeping: R side/ bricks under headboard  SABA use: as above 02: as above     No obvious day to day or daytime variability or assoc excess/ purulent sputum or mucus plugs or hemoptysis or cp or chest tightness, subjective wheeze or overt sinus or hb symptoms.  Sleeping as above on 2lpm  without nocturnal  or early am exacerbation  of respiratory  C/o's or AM HA  or need for noct saba. Also denies any obvious fluctuation of symptoms with weather or environmental changes or other aggravating or alleviating factors except as outlined above   No unusual exposure hx or h/o childhood pna/ asthma or knowledge of premature birth.  Current Allergies, Complete Past Medical History, Past Surgical History, Family History, and Social History were reviewed in [employer] record.  ROS  The following are not active complaints unless bolded Hoarseness, sore throat, dysphagia, dental problems, itching,  sneezing,  nasal congestion or discharge of excess mucus or purulent secretions, ear ache,   fever, chills, sweats, unintended wt loss or wt gain, classically pleuritic or exertional cp,  orthopnea pnd or arm/hand swelling  or leg swelling, presyncope, palpitations, abdominal pain, anorexia, nausea, vomiting, diarrhea  or change in bowel habits or change in bladder habits, change in stools or change in urine, dysuria, hematuria,  rash, arthralgias, visual complaints, headache, numbness, weakness or ataxia or problems with walking or coordination,  change in mood or  memory.        Current Meds  Medication Sig  . albuterol (PROAIR HFA) 108 (90 Base) MCG/ACT inhaler Inhale 2 puffs into the lungs every 4 (four) hours as needed for wheezing or shortness of breath.  Marland Kitchen amLODipine (NORVASC) 5 MG tablet Take 5 mg by mouth daily.  Marland Kitchen aspirin 81 MG tablet Take 81 mg by mouth at bedtime.   . cinacalcet (SENSIPAR) 30 MG tablet Take 30 mg by mouth daily.  Marland Kitchen diltiazem (CARDIZEM CD) 360 MG 24 hr capsule Take 360 mg by mouth daily.  . famotidine (PEPCID) 20 MG tablet Take 20 mg by mouth daily.  . furosemide (LASIX) 80 MG tablet Take 80 mg by mouth daily. And 40 mg in the pm  She has 40 mg tablets on hand also  . loratadine (CLARITIN) 10 MG tablet Take 10 mg by mouth daily.   . OXYGEN 2lpm with sleep and exertion  [facility]  .   SYMBICORT 160-4.5 MCG/ACT inhaler INHALE 2 PUFFS BY MOUTH TWICE DAILY            Objective:   Physical Exam  11/10/2012       206 vs 12/19/2012 206  Vs 207 01/16/2013 > 04/27/2013  210 > 06/15/2013  208  >211 07/27/2013 > 04/19/14 201 >203 05/10/2014 > 07/05/2014 201 >10/07/2014 200>  01/10/2015   195 > 09/13/2015  193 > 03/15/2016  192  > 12/20/2016 185 > 08/11/2017  189 > 02/06/2018  186 >  05/19/2018  177    amb bf nad     Vital signs reviewed - Note on arrival 02 sats  99% on 2lpm continuous 02       HEENT:Upper and lower partial dentures/ nl  oropharynx. Nl external ear canals without cough  reflex -  Mild bilateral non-specific turbinate edema     NECK :  without JVD/Nodes/TM/ nl carotid upstrokes bilaterally   LUNGS: no acc muscle use,  Mod barrel  contour chest wall with bilateral  Distant bs s audible wheeze and  without cough on insp or exp maneuver and mod  Hyperresonant  to  percussion bilaterally     CV:  RRR  no s3 or murmur or increase in P2, and no edema   ABD:  soft and nontender with pos mid  insp Hoover's  in the supine  position. No bruits or organomegaly appreciated, bowel sounds nl  MS:   Nl gait/  ext warm without deformities, calf tenderness, cyanosis or clubbing No obvious joint restrictions   SKIN: warm and dry without lesions    NEURO:  alert, approp, nl sensorium with  no motor or cerebellar deficits apparent.                   Assessment & Plan:

## 2018-06-30 ENCOUNTER — Other Ambulatory Visit: Payer: Self-pay | Admitting: Internal Medicine

## 2018-07-19 ENCOUNTER — Other Ambulatory Visit: Payer: Self-pay

## 2018-07-19 ENCOUNTER — Encounter (HOSPITAL_COMMUNITY): Payer: Self-pay | Admitting: Emergency Medicine

## 2018-07-19 ENCOUNTER — Inpatient Hospital Stay (HOSPITAL_COMMUNITY)
Admission: EM | Admit: 2018-07-19 | Discharge: 2018-07-28 | DRG: 264 | Disposition: A | Discharge status: Home Health | Payer: Medicare Other | Attending: Internal Medicine | Admitting: Internal Medicine

## 2018-07-19 ENCOUNTER — Emergency Department (HOSPITAL_COMMUNITY): Payer: Medicare Other

## 2018-07-19 DIAGNOSIS — Z833 Family history of diabetes mellitus: Secondary | ICD-10-CM | POA: Diagnosis not present

## 2018-07-19 DIAGNOSIS — I132 Hypertensive heart and chronic kidney disease with heart failure and with stage 5 chronic kidney disease, or end stage renal disease: Principal | ICD-10-CM | POA: Diagnosis present

## 2018-07-19 DIAGNOSIS — Z7951 Long term (current) use of inhaled steroids: Secondary | ICD-10-CM | POA: Diagnosis not present

## 2018-07-19 DIAGNOSIS — J449 Chronic obstructive pulmonary disease, unspecified: Secondary | ICD-10-CM | POA: Diagnosis present

## 2018-07-19 DIAGNOSIS — Z66 Do not resuscitate: Secondary | ICD-10-CM | POA: Diagnosis present

## 2018-07-19 DIAGNOSIS — Z419 Encounter for procedure for purposes other than remedying health state, unspecified: Secondary | ICD-10-CM

## 2018-07-19 DIAGNOSIS — Z90710 Acquired absence of both cervix and uterus: Secondary | ICD-10-CM | POA: Diagnosis not present

## 2018-07-19 DIAGNOSIS — N2581 Secondary hyperparathyroidism of renal origin: Secondary | ICD-10-CM | POA: Diagnosis present

## 2018-07-19 DIAGNOSIS — N186 End stage renal disease: Secondary | ICD-10-CM | POA: Diagnosis present

## 2018-07-19 DIAGNOSIS — J9621 Acute and chronic respiratory failure with hypoxia: Secondary | ICD-10-CM | POA: Diagnosis present

## 2018-07-19 DIAGNOSIS — D649 Anemia, unspecified: Secondary | ICD-10-CM | POA: Diagnosis present

## 2018-07-19 DIAGNOSIS — I272 Pulmonary hypertension, unspecified: Secondary | ICD-10-CM | POA: Diagnosis present

## 2018-07-19 DIAGNOSIS — R0602 Shortness of breath: Secondary | ICD-10-CM | POA: Diagnosis present

## 2018-07-19 DIAGNOSIS — I5043 Acute on chronic combined systolic (congestive) and diastolic (congestive) heart failure: Secondary | ICD-10-CM | POA: Diagnosis present

## 2018-07-19 DIAGNOSIS — D631 Anemia in chronic kidney disease: Secondary | ICD-10-CM | POA: Diagnosis present

## 2018-07-19 DIAGNOSIS — I10 Essential (primary) hypertension: Secondary | ICD-10-CM | POA: Diagnosis not present

## 2018-07-19 DIAGNOSIS — Z9981 Dependence on supplemental oxygen: Secondary | ICD-10-CM

## 2018-07-19 DIAGNOSIS — Z7982 Long term (current) use of aspirin: Secondary | ICD-10-CM | POA: Diagnosis not present

## 2018-07-19 DIAGNOSIS — Z825 Family history of asthma and other chronic lower respiratory diseases: Secondary | ICD-10-CM

## 2018-07-19 DIAGNOSIS — N179 Acute kidney failure, unspecified: Secondary | ICD-10-CM | POA: Diagnosis present

## 2018-07-19 DIAGNOSIS — I350 Nonrheumatic aortic (valve) stenosis: Secondary | ICD-10-CM | POA: Diagnosis not present

## 2018-07-19 DIAGNOSIS — K219 Gastro-esophageal reflux disease without esophagitis: Secondary | ICD-10-CM | POA: Diagnosis present

## 2018-07-19 DIAGNOSIS — N184 Chronic kidney disease, stage 4 (severe): Secondary | ICD-10-CM | POA: Diagnosis present

## 2018-07-19 DIAGNOSIS — Z515 Encounter for palliative care: Secondary | ICD-10-CM | POA: Diagnosis not present

## 2018-07-19 DIAGNOSIS — N185 Chronic kidney disease, stage 5: Secondary | ICD-10-CM | POA: Diagnosis not present

## 2018-07-19 DIAGNOSIS — Z992 Dependence on renal dialysis: Secondary | ICD-10-CM

## 2018-07-19 DIAGNOSIS — I509 Heart failure, unspecified: Secondary | ICD-10-CM

## 2018-07-19 DIAGNOSIS — E8889 Other specified metabolic disorders: Secondary | ICD-10-CM | POA: Diagnosis present

## 2018-07-19 DIAGNOSIS — Z79899 Other long term (current) drug therapy: Secondary | ICD-10-CM

## 2018-07-19 DIAGNOSIS — N170 Acute kidney failure with tubular necrosis: Secondary | ICD-10-CM | POA: Diagnosis not present

## 2018-07-19 DIAGNOSIS — I5042 Chronic combined systolic (congestive) and diastolic (congestive) heart failure: Secondary | ICD-10-CM | POA: Diagnosis not present

## 2018-07-19 DIAGNOSIS — Z87891 Personal history of nicotine dependence: Secondary | ICD-10-CM | POA: Diagnosis not present

## 2018-07-19 DIAGNOSIS — M79673 Pain in unspecified foot: Secondary | ICD-10-CM

## 2018-07-19 HISTORY — DX: Chronic obstructive pulmonary disease, unspecified: J44.9

## 2018-07-19 LAB — I-STAT VENOUS BLOOD GAS, ED
Acid-base deficit: 10 mmol/L — ABNORMAL HIGH (ref 0.0–2.0)
Bicarbonate: 15 mmol/L — ABNORMAL LOW (ref 20.0–28.0)
O2 Saturation: 99 %
PCO2 VEN: 27.7 mmHg — AB (ref 44.0–60.0)
TCO2: 16 mmol/L — ABNORMAL LOW (ref 22–32)
pH, Ven: 7.343 (ref 7.250–7.430)
pO2, Ven: 119 mmHg — ABNORMAL HIGH (ref 32.0–45.0)

## 2018-07-19 LAB — PREPARE RBC (CROSSMATCH)

## 2018-07-19 LAB — COMPREHENSIVE METABOLIC PANEL
ALK PHOS: 57 U/L (ref 38–126)
ALT: 12 U/L (ref 0–44)
AST: 14 U/L — ABNORMAL LOW (ref 15–41)
Albumin: 3.7 g/dL (ref 3.5–5.0)
Anion gap: 12 (ref 5–15)
BUN: 85 mg/dL — ABNORMAL HIGH (ref 8–23)
CO2: 15 mmol/L — ABNORMAL LOW (ref 22–32)
Calcium: 9.4 mg/dL (ref 8.9–10.3)
Chloride: 108 mmol/L (ref 98–111)
Creatinine, Ser: 5.21 mg/dL — ABNORMAL HIGH (ref 0.44–1.00)
GFR calc Af Amer: 8 mL/min — ABNORMAL LOW (ref >60)
GFR calc non Af Amer: 7 mL/min — ABNORMAL LOW (ref >60)
GLUCOSE: 145 mg/dL — AB (ref 70–99)
Potassium: 4.7 mmol/L (ref 3.5–5.1)
Sodium: 135 mmol/L (ref 135–145)
Total Bilirubin: 0.4 mg/dL (ref 0.3–1.2)
Total Protein: 6.2 g/dL — ABNORMAL LOW (ref 6.5–8.1)

## 2018-07-19 LAB — CBC
HCT: 14.5 % — ABNORMAL LOW (ref 36.0–46.0)
HEMATOCRIT: 14.3 % — AB (ref 36.0–46.0)
HEMOGLOBIN: 4.4 g/dL — AB (ref 12.0–15.0)
Hemoglobin: 4.3 g/dL — CL (ref 12.0–15.0)
MCH: 28.4 pg (ref 26.0–34.0)
MCH: 29.1 pg (ref 26.0–34.0)
MCHC: 30.8 g/dL (ref 30.0–36.0)
MCHC: 29.7 g/dL — ABNORMAL LOW (ref 30.0–36.0)
MCV: 92.3 fL (ref 80.0–100.0)
MCV: 98 fL (ref 80.0–100.0)
Platelets: 218 10*3/uL (ref 150–400)
Platelets: 210 10*3/uL (ref 150–400)
RBC: 1.55 MIL/uL — ABNORMAL LOW (ref 3.87–5.11)
RBC: 1.48 MIL/uL — ABNORMAL LOW (ref 3.87–5.11)
RDW: 17.1 % — ABNORMAL HIGH (ref 11.5–15.5)
RDW: 17.2 % — ABNORMAL HIGH (ref 11.5–15.5)
WBC: 9.2 10*3/uL (ref 4.0–10.5)
WBC: 8.8 10*3/uL (ref 4.0–10.5)
nRBC: 0 % (ref 0.0–0.2)
nRBC: 0 % (ref 0.0–0.2)

## 2018-07-19 LAB — PROTIME-INR
INR: 1.13
Prothrombin Time: 14.4 seconds (ref 11.4–15.2)

## 2018-07-19 LAB — BRAIN NATRIURETIC PEPTIDE: B Natriuretic Peptide: 784.4 pg/mL — ABNORMAL HIGH (ref 0.0–100.0)

## 2018-07-19 LAB — I-STAT TROPONIN, ED: Troponin i, poc: 0.05 ng/mL (ref 0.00–0.08)

## 2018-07-19 LAB — ABO/RH: ABO/RH(D): B POS

## 2018-07-19 LAB — I-STAT CG4 LACTIC ACID, ED: Lactic Acid, Venous: 1.14 mmol/L (ref 0.5–1.9)

## 2018-07-19 MED ORDER — ACETAMINOPHEN 325 MG PO TABS: 650.0000 mg | ORAL_TABLET | ORAL | Status: DC | PRN

## 2018-07-19 MED ORDER — SODIUM CHLORIDE 0.9% FLUSH
3.0000 mL | Freq: Two times a day (BID) | INTRAVENOUS | Status: DC
  Administered 2018-07-19 – 2018-07-27 (×16): 3 mL via INTRAVENOUS

## 2018-07-19 MED ORDER — DILTIAZEM HCL ER COATED BEADS 360 MG PO CP24
360.0000 mg | ORAL_CAPSULE | Freq: Every day | ORAL | Status: DC
  Administered 2018-07-20 – 2018-07-23 (×4): 360 mg via ORAL
  Filled 2018-07-19 (×2): qty 1
  Filled 2018-07-19 (×3): qty 2
  Filled 2018-07-19 (×2): qty 1
  Filled 2018-07-19: qty 2

## 2018-07-19 MED ORDER — CINACALCET HCL 30 MG PO TABS
30.0000 mg | ORAL_TABLET | Freq: Every day | ORAL | Status: DC
  Administered 2018-07-20 – 2018-07-27 (×8): 30 mg via ORAL
  Filled 2018-07-19 (×8): qty 1

## 2018-07-19 MED ORDER — ONDANSETRON HCL 4 MG/2ML IJ SOLN
4.0000 mg | Freq: Four times a day (QID) | INTRAMUSCULAR | Status: DC | PRN
  Administered 2018-07-23: 4 mg via INTRAVENOUS

## 2018-07-19 MED ORDER — AMLODIPINE BESYLATE 10 MG PO TABS
10.0000 mg | ORAL_TABLET | Freq: Every day | ORAL | Status: DC
  Administered 2018-07-20 – 2018-07-27 (×8): 10 mg via ORAL
  Filled 2018-07-19 (×8): qty 1

## 2018-07-19 MED ORDER — SODIUM CHLORIDE 0.9% FLUSH: 3.0000 mL | INTRAVENOUS | Status: DC | PRN

## 2018-07-19 MED ORDER — BOOST / RESOURCE BREEZE PO LIQD CUSTOM
1.0000 | Freq: Three times a day (TID) | ORAL | Status: DC
  Administered 2018-07-20 – 2018-07-28 (×19): 1 via ORAL
  Filled 2018-07-19 (×2): qty 1

## 2018-07-19 MED ORDER — SODIUM CHLORIDE 0.9 % IV SOLN: 10.0000 mL/h | Freq: Once | INTRAVENOUS | Status: DC

## 2018-07-19 MED ORDER — FUROSEMIDE 10 MG/ML IJ SOLN: 40.0000 mg | Freq: Once | INTRAMUSCULAR | Status: DC

## 2018-07-19 MED ORDER — SODIUM CHLORIDE 0.9% IV SOLUTION: Freq: Once | INTRAVENOUS | Status: DC

## 2018-07-19 MED ORDER — SODIUM CHLORIDE 0.9 % IV SOLN
250.0000 mL | INTRAVENOUS | Status: DC | PRN
  Administered 2018-07-23: 11:00:00 via INTRAVENOUS

## 2018-07-19 MED ORDER — SODIUM BICARBONATE 650 MG PO TABS
650.0000 mg | ORAL_TABLET | Freq: Two times a day (BID) | ORAL | Status: DC
  Administered 2018-07-20: 650 mg via ORAL
  Filled 2018-07-19: qty 1

## 2018-07-19 MED ORDER — MOMETASONE FURO-FORMOTEROL FUM 200-5 MCG/ACT IN AERO
2.0000 | INHALATION_SPRAY | Freq: Two times a day (BID) | RESPIRATORY_TRACT | Status: DC
  Administered 2018-07-20 – 2018-07-27 (×14): 2 via RESPIRATORY_TRACT
  Filled 2018-07-19 (×4): qty 8.8

## 2018-07-19 MED ORDER — PANTOPRAZOLE SODIUM 40 MG PO TBEC
40.0000 mg | DELAYED_RELEASE_TABLET | Freq: Every day | ORAL | Status: DC
  Administered 2018-07-20 – 2018-07-27 (×8): 40 mg via ORAL
  Filled 2018-07-19 (×8): qty 1

## 2018-07-19 MED ORDER — FUROSEMIDE 10 MG/ML IJ SOLN
40.0000 mg | Freq: Once | INTRAMUSCULAR | Status: AC
  Administered 2018-07-19: 40 mg via INTRAVENOUS
  Filled 2018-07-19: qty 4

## 2018-07-19 MED ORDER — FUROSEMIDE 40 MG PO TABS: 40.0000 mg | ORAL_TABLET | Freq: Every day | ORAL | Status: DC

## 2018-07-19 MED ORDER — FUROSEMIDE 80 MG PO TABS: 80.0000 mg | ORAL_TABLET | Freq: Every day | ORAL | Status: DC

## 2018-07-19 NOTE — ED Notes (Signed)
Pt resting on bipap, family at bedside, [place] placed.

## 2018-07-19 NOTE — Progress Notes (Signed)
Patient is admitted to 5W 09 with the diagnosis of symptomatic anemia. Alert and oriented x 4. Denied any acute pain at this moment. PRBC infusing without any reaction. Patient tolerating BIPAP well. Full assessment to epic completed. Will continue to monitor.

## 2018-07-19 NOTE — Progress Notes (Signed)
Pt arrived to the floor on NIV via Servo Ventilator.  Pt placed on NIV via V60 BIPAP machine.  Tol well.  Will cont to monitor.

## 2018-07-19 NOTE — ED Triage Notes (Signed)
Pt to ED via [facility] from home, with c/o shortness of breath- increasing over past couple days, resp distress today- on ems arrival- pt was clammy with diminished lung sounds. CPap on arrival, per ems

## 2018-07-19 NOTE — ED Provider Notes (Signed)
[facility] Emergency Department Provider Note MRN:  [id number]  Arrival date & time: 07/19/18     Chief Complaint   Respiratory Distress   History of Present Illness   Mallory Chavez is a 83 y.o. year-old female with a history of CHF, COPD presenting to the ED with chief complaint of respiratory distress.  Several days of mild shortness of breath that became more severe yesterday, severe respiratory distress today.  Mild cough today.  Patient denies recent fever, no chest pain.  Started on CPAP with EMS.  Review of Systems  A complete 10 system review of systems was obtained and all systems are negative except as noted in the HPI and PMH.   Patient's Health History    Past Medical History:  Diagnosis Date  . Acute renal failure (ARF) ([place]) 06/15/2016  . Allergy   . Arthritis   . Asthma   . CHF (congestive heart failure) ([place]) 1980   controlled with meds  . Chronic respiratory failure ([place])   . CKD (chronic kidney disease)   . GERD (gastroesophageal reflux disease)   . Hyperglycemia   . Hyperlipidemia   . Hypertension   . Hyponatremia     Past Surgical History:  Procedure Laterality Date  . [place]  . TUMOR REMOVAL     lower back/benign and has come back  . VAGINAL HYSTERECTOMY      Family History  Problem Relation Age of Onset  . Diabetes Mother   . Emphysema Father     Social History   Socioeconomic History  . Marital status: Widowed    Spouse name: Not on file  . Number of children: 1  . Years of education: Not on file  . Highest education level: Not on file  Occupational History  . Not on file  Social Needs  . Financial resource strain: Not on file  . Food insecurity:    Worry: Not on file    Inability: Not on file  . Transportation needs:    Medical: Not on file    Non-medical: Not on file  Tobacco Use  . Smoking status: Former Smoker    Packs/day: 1.50    Years: 20.00    Pack years: 30.00   Types: Cigarettes    Last attempt to quit: 07/02/1992    Years since quitting: 26.0  . Smokeless tobacco: Never Used  Substance and Sexual Activity  . Alcohol use: No  . Drug use: No  . Sexual activity: Not on file  Lifestyle  . Physical activity:    Days per week: Not on file    Minutes per session: Not on file  . Stress: Not on file  Relationships  . Social connections:    Talks on phone: Not on file    Gets together: Not on file    Attends religious service: Not on file    Active member of club or organization: Not on file    Attends meetings of clubs or organizations: Not on file    Relationship status: Not on file  . Intimate partner violence:    Fear of current or ex partner: Not on file    Emotionally abused: Not on file    Physically abused: Not on file    Forced sexual activity: Not on file  Other Topics Concern  . Not on file  Social History Narrative  . Not on file     Physical Exam  Vital Signs and Nursing  Notes reviewed Vitals:   07/19/18 2015 07/19/18 2103  BP: (!) 135/59 (!) 145/54  Pulse: 70 71  Resp: 17 20  Temp:  97.6 F (36.4 C)  SpO2: 100%     CONSTITUTIONAL: Well-appearing, NAD NEURO:  Alert and oriented x 3, no focal deficits EYES:  eyes equal and reactive ENT/NECK:  no LAD, mild JVD CARDIO: Regular rate, well-perfused, normal S1 and S2 PULM: Crackles bilateral bases, increased work of breathing GI/GU:  normal bowel sounds, non-distended, non-tender MSK/SPINE:  No gross deformities, 1+ pitting edema bilateral lower extremities SKIN:  no rash, atraumatic PSYCH:  Appropriate speech and behavior  Diagnostic and Interventional Summary    EKG Interpretation  Date/Time:  Saturday July 19 2018 17:28:26 EST Ventricular Rate:  82 PR Interval:    QRS Duration: 103 QT Interval:  325 QTC Calculation: 380 R Axis:   28 Text Interpretation:  Sinus rhythm Multiple ventricular premature complexes Repol abnrm suggests ischemia, diffuse leads  Confirmed by Gerlene Fee [phone]) on 07/19/2018 5:33:08 PM      Labs Reviewed  CBC - Abnormal; Notable for the following components:      Result Value   RBC 1.55 (*)    Hemoglobin 4.4 (*)    HCT 14.3 (*)    RDW 17.1 (*)    All other components within normal limits  COMPREHENSIVE METABOLIC PANEL - Abnormal; Notable for the following components:   CO2 15 (*)    Glucose, Bld 145 (*)    BUN 85 (*)    Creatinine, Ser 5.21 (*)    Total Protein 6.2 (*)    AST 14 (*)    GFR calc non Af Amer 7 (*)    GFR calc Af Amer 8 (*)    All other components within normal limits  BRAIN NATRIURETIC PEPTIDE - Abnormal; Notable for the following components:   B Natriuretic Peptide 784.4 (*)    All other components within normal limits  CBC - Abnormal; Notable for the following components:   RBC 1.48 (*)    Hemoglobin 4.3 (*)    HCT 14.5 (*)    MCHC 29.7 (*)    RDW 17.2 (*)    All other components within normal limits  I-STAT VENOUS BLOOD GAS, ED - Abnormal; Notable for the following components:   pCO2, Ven 27.7 (*)    pO2, Ven 119.0 (*)    Bicarbonate 15.0 (*)    TCO2 16 (*)    Acid-base deficit 10.0 (*)    All other components within normal limits  PROTIME-INR  BLOOD GAS, VENOUS  I-STAT TROPONIN, ED  I-STAT CG4 LACTIC ACID, ED  TYPE AND SCREEN  ABO/RH  PREPARE RBC (CROSSMATCH)  PREPARE RBC (CROSSMATCH)    DG Chest Port 1 View  Final Result      Medications  0.9 %  sodium chloride infusion (has no administration in time range)  0.9 %  sodium chloride infusion (Manually program via Guardrails IV Fluids) (has no administration in time range)  furosemide (LASIX) injection 40 mg (40 mg Intravenous Given 07/19/18 2100)     Procedures Emergency Ultrasound Study:   Angiocath insertion Performed by: Maudie Flakes  Consent: Verbal consent obtained. Risks and benefits: risks, benefits and alternatives were discussed Immediately prior to procedure the correct patient, procedure,  equipment, support staff and site/side marked as needed.  Indication: difficult IV access Preparation: Patient was prepped and draped in the usual sterile fashion. Vein Location: The right brachial vein was visualized during assessment  for potential access sites and was found to be patent/ easily compressed with linear ultrasound.  The needle was visualized with real-time ultrasound and guided into the vein. Gauge: 20  Images were not saved due to the time sensitive nature of the situation. Normal blood return.  Patient tolerance: Patient tolerated the procedure well with no immediate complications.  Critical Care Critical Care Documentation Critical care time provided by me (excluding procedures): 38 minutes  Condition necessitating critical care: Symptomatic anemia requiring blood transfusion, CHF exacerbation  Components of critical care management: reviewing of prior records, laboratory and imaging interpretation, frequent re-examination and reassessment of vital signs, administration of packed red blood cells, noninvasive positive pressure ventilation, discussion with consulting services.    ED Course and Medical Decision Making  I have reviewed the triage vital signs and the nursing notes.  Pertinent labs & imaging results that were available during my care of the patient were reviewed by me and considered in my medical decision making (see below for details).  CHF versus COPD exacerbation in this 83 year old female with history of both.  Favoring CHF given the JVD, edema, lack of prodromal viral illness, no wheezing, crackles in the bases.  Much improved after CPAP with EMS, will continue noninvasive positive pressure ventilation, labs and chest x-ray pending.  Patient feeling very comfortable on BiPAP, normal vital signs, labs reveal hemoglobin of 4.4, which is very surprising, denies any blood loss or black stools.  Confirmed with repeat CBC.  Ordered for slow infusion of packed  red blood cells given her fluid overload state, admitted to hospital service for further care.  Barth Kirks. Sedonia Small, [place] [email or web address]  Final Clinical Impressions(s) / ED Diagnoses     ICD-10-CM   1. Symptomatic anemia D64.9   2. SOB (shortness of breath) R06.02 DG Chest [facility] 1 View    DG Chest [place] 1 View  3. Acute on chronic congestive heart failure, unspecified heart failure type [facility]) I50.9     ED Discharge Orders    None         Maudie Flakes, MD 07/19/18 2109

## 2018-07-19 NOTE — Progress Notes (Signed)
Pt placed on NIV, 10/5, 40%.  Tolerating well at this time.

## 2018-07-19 NOTE — H&P (Signed)
History and Physical    Mallory Chavez WCH:[id number] DOB: 10-Jul-1935 DOA: 07/19/2018  PCP: [name], MD   Patient coming from: Home   Chief Complaint: SOB   HPI: Mallory Chavez is a 83 y.o. female with medical history significant for hypertension, chronic combined systolic and diastolic CHF, chronic kidney disease stage IV, and COPD, presented to the emergency department with respiratory distress.  Patient is accompanied by her daughter who assist with the history.  She has been experiencing insidiously worsening shortness of breath over weeks to months, but worsened acutely over the past day.  She denies any significant cough, has not noted any fevers or chills, denies any chest pain or palpitations, and believes her pedal edema may be a little worse than usual over the past few days.  She denies melena, hematochezia, or hematemesis.  Patient was in distress on EMS arrival and was placed on CPAP and brought into the ED.  ED Course: Upon arrival to the ED, patient is found to be afebrile, saturating well on BiPAP, and with vitals otherwise stable.  EKG features a sinus rhythm with PVCs and diffuse repolarization abnormality.  Chest x-ray is notable for stable cardiomegaly, but no acute cardiopulmonary disease.  Chemistry panel features a bicarbonate of 15, BUN 85, and creatinine of 5.21, up from 3.45 two years ago.  CBC is concerning for hemoglobin of 4.4, down from 9.4 on most recent CBC from 2 years ago.  Lactic acid is normal, troponin is normal, and BNP is elevated to 784.  Hospitalist were asked to admit, ED physician agreed to check FOBT.  Review of Systems:  All other systems reviewed and apart from HPI, are negative.  Past Medical History:  Diagnosis Date  . Acute renal failure (ARF) ([place]) 06/15/2016  . Allergy   . Arthritis   . Asthma   . CHF (congestive heart failure) ([place]) 1980   controlled with meds  . Chronic respiratory failure ([place])   . CKD (chronic kidney  disease)   . COPD (chronic obstructive pulmonary disease) ([place])   . GERD (gastroesophageal reflux disease)   . Hyperglycemia   . Hyperlipidemia   . Hypertension   . Hyponatremia     Past Surgical History:  Procedure Laterality Date  . [place]  . TUMOR REMOVAL     lower back/benign and has come back  . VAGINAL HYSTERECTOMY       reports that she quit smoking about 26 years ago. Her smoking use included cigarettes. She has a 30.00 pack-year smoking history. She has never used smokeless tobacco. She reports that she does not drink alcohol or use drugs.  No Active Allergies  Family History  Problem Relation Age of Onset  . Diabetes Mother   . Emphysema Father      Prior to Admission medications   Medication Sig Start Date End Date Taking? Authorizing Provider  amLODipine (NORVASC) 10 MG tablet Take 10 mg by mouth daily after supper.    Yes [provider]  aspirin EC 81 MG tablet Take 81 mg by mouth daily after supper.   Yes [provider]  budesonide-formoterol (SYMBICORT) 160-4.5 MCG/ACT inhaler Inhale 2 puffs into the lungs 2 (two) times daily.   Yes [provider]  cinacalcet (SENSIPAR) 30 MG tablet Take 30 mg by mouth daily.   Yes [provider]  colchicine 0.6 MG tablet Take 0.6 mg by mouth daily as needed (gout attack).   Yes [provider]  diltiazem (CARDIZEM CD) 360 MG 24 hr capsule Take 360 mg by mouth daily. 06/18/16  Yes [provider]  esomeprazole (NEXIUM) 20 MG capsule Take 20 mg by mouth daily at 12 noon.   Yes [provider]  furosemide (LASIX) 40 MG tablet Take 40 mg by mouth daily after lunch.   Yes [provider]  furosemide (LASIX) 80 MG tablet Take 80 mg by mouth daily.    Yes [provider]  loratadine (CLARITIN) 10 MG tablet Take 10 mg by mouth daily after supper.    Yes [provider]  OXYGEN Inhale 2 L into the lungs See admin  instructions. 2lpm with sleep and exertion  [facility]    Yes [provider]  Polyvinyl Alcohol-Povidone (REFRESH OP) Place 1 drop into both eyes daily as needed (dry eyes).   Yes [provider]  PROAIR HFA 108 (90 Base) MCG/ACT inhaler INHALE 2 PUFFS EVERY 4 HOURS IF NEEDED WHEEZING SHORTNESS OF BREATH Patient taking differently: Inhale 2 puffs into the lungs every 4 (four) hours as needed for wheezing or shortness of breath.  06/30/18  Yes Tanda Rockers, MD    Physical Exam: Vitals:   07/19/18 2015 07/19/18 2103 07/19/18 2105 07/19/18 2123  BP: (!) 135/59 (!) 145/54 (!) 145/54 (!) 139/57  Pulse: 70 71 71 70  Resp: 17 20 20 20   Temp:  97.6 F (36.4 C) 97.6 F (36.4 C) (!) 97.4 F (36.3 C)  TempSrc:  Tympanic Tympanic Tympanic  SpO2: 100%  100%     Constitutional: NAD, calm  Eyes: PERTLA, lids and conjunctivae normal ENMT: Mucous membranes are moist. Posterior pharynx clear of any exudate or lesions.   Neck: normal, supple, no masses, no thyromegaly Respiratory: no wheezing, no rhonchi. Dyspnea with speech. No accessory muscle use.  Cardiovascular: S1 & S2 heard, regular rate and rhythm. Pedal edema bilaterally. JVP 10 cmH2O. Abdomen: No distension, no tenderness, soft. Bowel sounds active.  Musculoskeletal: no clubbing / cyanosis. No joint deformity upper and lower extremities.    Skin: no significant rashes, lesions, ulcers. Warm, dry, well-perfused. Neurologic: No facial asymmetry. Sensation intact. Moving all extremities.  Psychiatric: Alert and oriented to person, place, and situation. Pleasant and cooperative.    Labs on Admission: I have personally reviewed following labs and imaging studies  CBC: Recent Labs  Lab 07/19/18 1744 07/19/18 1858  WBC 9.2 8.8  HGB 4.4* 4.3*  HCT 14.3* 14.5*  MCV 92.3 98.0  PLT 218 542   Basic Metabolic Panel: Recent Labs  Lab 07/19/18 1744  NA 135  K 4.7  CL 108  CO2 15*  GLUCOSE 145*  BUN 85*  CREATININE  5.21*  CALCIUM 9.4   GFR: CrCl cannot be calculated (Unknown ideal weight.). Liver Function Tests: Recent Labs  Lab 07/19/18 1744  AST 14*  ALT 12  ALKPHOS 57  BILITOT 0.4  PROT 6.2*  ALBUMIN 3.7   No results for input(s): LIPASE, AMYLASE in the last 168 hours. No results for input(s): AMMONIA in the last 168 hours. Coagulation Profile: Recent Labs  Lab 07/19/18 1744  INR 1.13   Cardiac Enzymes: No results for input(s): CKTOTAL, CKMB, CKMBINDEX, TROPONINI in the last 168 hours. BNP (last 3 results) No results for input(s): PROBNP in the last 8760 hours. HbA1C: No results for input(s): HGBA1C in the last 72 hours. CBG: No results for input(s): GLUCAP in the last 168 hours. Lipid Profile: No results for input(s): CHOL, HDL, LDLCALC, TRIG, CHOLHDL, LDLDIRECT  in the last 72 hours. Thyroid Function Tests: No results for input(s): TSH, T4TOTAL, FREET4, T3FREE, THYROIDAB in the last 72 hours. Anemia Panel: No results for input(s): VITAMINB12, FOLATE, FERRITIN, TIBC, IRON, RETICCTPCT in the last 72 hours. Urine analysis:    Component Value Date/Time   COLORURINE YELLOW 07/02/2016 1058   APPEARANCEUR CLEAR 07/02/2016 1058   LABSPEC 1.010 07/02/2016 1058   PHURINE 6.0 07/02/2016 1058   GLUCOSEU NEGATIVE 07/02/2016 1058   HGBUR NEGATIVE 07/02/2016 1058   BILIRUBINUR NEGATIVE 07/02/2016 1058   KETONESUR NEGATIVE 07/02/2016 1058   PROTEINUR 30 (A) 07/02/2016 1058   NITRITE NEGATIVE 07/02/2016 1058   LEUKOCYTESUR SMALL (A) 07/02/2016 1058   Sepsis Labs: @LABRCNTIP (procalcitonin:4,lacticidven:4) )No results found for this or any previous visit (from the past 240 hour(s)).   Radiological Exams on Admission: Dg Chest Port 1 View  Result Date: 07/19/2018 CLINICAL DATA:  Shortness of breath for 3 days EXAM: PORTABLE CHEST 1 VIEW COMPARISON:  02/06/2018 FINDINGS: Cardiac shadow is enlarged but stable. Lungs are well aerated bilaterally. No focal infiltrate or sizable effusion  is seen. No acute bony abnormality is noted. IMPRESSION: Stable cardiomegaly.  No acute abnormality noted. Electronically Signed   By: Inez Catalina M.D.   On: 07/19/2018 18:26    EKG: Independently reviewed. Sinus rhythm, PVC's, diffuse repolarization abnormality.   Assessment/Plan  1. Symptomatic anemia  - Presents with insidiously worsening SOB over weeks to months, becoming severe today  - Found to have Hgb of 4.3 in ED, down from 9.4 on most recent CBC from 2 yrs ago  - Denies melena, hematochezia, abd pain, or hematemesis  - Check FOBT, will try to add anemia panel on to pre-transfusion blood draw, transfuse 3 units with Lasix between units, check post-transfusion H&H    2. Acute on chronic combined systolic & diastolic CHF  - Presents with insidiously worsening SOB over weeks to months, becoming severe today  - Symptomatic anemia is principal problem, but also appears hypervolemic with elevated JVP and BNP  - Continue cardiac monitoring, diurese with IV Lasix before and after blood transfusions, follow daily wt and I/O's    3. Acute kidney injury superimposed on CKD IV  - SCr is 5.21 on admission, up from 3.45 on most recent chem panel from 2 yrs ago  - Appears hypervolemic on admission  - Continue Lasix, place on bicarb for now, renally-dose medications, avoid nephrotoxins, and repeat chem panel in am    4. Hypertension  - BP at goal  - Continue Norvasc and diltiazem as tolerated    5. COPD  - No cough or wheezing  - Continue ICS/LABA and as-needed albuterol    DVT prophylaxis: SCD's  Code Status: Full  Family Communication: Daughter updated at bedside Consults called: None Admission status: Inpatient     Vianne Bulls, MD [facility] Pager [phone]  If 7PM-7AM, please contact night-coverage [email or web address] Password [facility]  07/19/2018, 9:34 PM

## 2018-07-20 DIAGNOSIS — I5043 Acute on chronic combined systolic (congestive) and diastolic (congestive) heart failure: Secondary | ICD-10-CM

## 2018-07-20 DIAGNOSIS — D649 Anemia, unspecified: Secondary | ICD-10-CM

## 2018-07-20 DIAGNOSIS — Z515 Encounter for palliative care: Secondary | ICD-10-CM

## 2018-07-20 DIAGNOSIS — Z66 Do not resuscitate: Secondary | ICD-10-CM

## 2018-07-20 LAB — BASIC METABOLIC PANEL
Anion gap: 12 (ref 5–15)
BUN: 79 mg/dL — AB (ref 8–23)
CO2: 16 mmol/L — ABNORMAL LOW (ref 22–32)
Calcium: 9.7 mg/dL (ref 8.9–10.3)
Chloride: 108 mmol/L (ref 98–111)
Creatinine, Ser: 5.3 mg/dL — ABNORMAL HIGH (ref 0.44–1.00)
GFR calc Af Amer: 8 mL/min — ABNORMAL LOW (ref >60)
GFR calc non Af Amer: 7 mL/min — ABNORMAL LOW (ref >60)
Glucose, Bld: 108 mg/dL — ABNORMAL HIGH (ref 70–99)
Potassium: 4.3 mmol/L (ref 3.5–5.1)
Sodium: 136 mmol/L (ref 135–145)

## 2018-07-20 LAB — FOLATE: Folate: 17.3 ng/mL (ref >5.9)

## 2018-07-20 LAB — HEMOGLOBIN AND HEMATOCRIT, BLOOD
HCT: 25 % — ABNORMAL LOW (ref 36.0–46.0)
Hemoglobin: 8.3 g/dL — ABNORMAL LOW (ref 12.0–15.0)

## 2018-07-20 LAB — IRON AND TIBC
Iron: 169 ug/dL (ref 28–170)
Iron: 193 ug/dL — ABNORMAL HIGH (ref 28–170)
Saturation Ratios: 51 % — ABNORMAL HIGH (ref 10.4–31.8)
Saturation Ratios: 60 % — ABNORMAL HIGH (ref 10.4–31.8)
TIBC: 332 ug/dL (ref 250–450)
TIBC: 323 ug/dL (ref 250–450)
UIBC: 163 ug/dL
UIBC: 130 ug/dL

## 2018-07-20 LAB — VITAMIN B12: Vitamin B-12: 869 pg/mL (ref 180–914)

## 2018-07-20 LAB — RETICULOCYTES
IMMATURE RETIC FRACT: 25.2 % — AB (ref 2.3–15.9)
RBC.: 2.85 MIL/uL — ABNORMAL LOW (ref 3.87–5.11)
Retic Count, Absolute: 98.9 10*3/uL (ref 19.0–186.0)
Retic Ct Pct: 3.5 % — ABNORMAL HIGH (ref 0.4–3.1)

## 2018-07-20 LAB — PREPARE RBC (CROSSMATCH)

## 2018-07-20 LAB — FERRITIN: Ferritin: 35 ng/mL (ref 11–307)

## 2018-07-20 LAB — MRSA PCR SCREENING: MRSA BY PCR: NEGATIVE

## 2018-07-20 MED ORDER — DARBEPOETIN ALFA 200 MCG/0.4ML IJ SOSY
200.0000 ug | PREFILLED_SYRINGE | INTRAMUSCULAR | Status: DC
  Filled 2018-07-20: qty 0.4

## 2018-07-20 MED ORDER — FUROSEMIDE 80 MG PO TABS
160.0000 mg | ORAL_TABLET | Freq: Three times a day (TID) | ORAL | Status: DC
  Administered 2018-07-20 – 2018-07-23 (×9): 160 mg via ORAL
  Filled 2018-07-20 (×9): qty 2

## 2018-07-20 MED ORDER — SODIUM CHLORIDE 0.9% IV SOLUTION
Freq: Once | INTRAVENOUS | Status: AC
  Administered 2018-07-20: 01:00:00 via INTRAVENOUS

## 2018-07-20 MED ORDER — METHOCARBAMOL 500 MG PO TABS
500.0000 mg | ORAL_TABLET | Freq: Three times a day (TID) | ORAL | Status: DC | PRN
  Administered 2018-07-20: 500 mg via ORAL
  Filled 2018-07-20: qty 1

## 2018-07-20 MED ORDER — FUROSEMIDE 10 MG/ML IJ SOLN
40.0000 mg | INTRAMUSCULAR | Status: AC | PRN
  Administered 2018-07-20 (×3): 40 mg via INTRAVENOUS
  Filled 2018-07-20 (×3): qty 4

## 2018-07-20 MED ORDER — METHOCARBAMOL 1000 MG/10ML IJ SOLN
500.0000 mg | Freq: Three times a day (TID) | INTRAVENOUS | Status: DC | PRN
  Filled 2018-07-20: qty 5

## 2018-07-20 NOTE — Consult Note (Signed)
Consultation Note Date: 07/20/2018   Patient Name: Mallory Chavez  DOB: 06-13-1936  MRN: [id number]  Age / Sex: 83 y.o., female  PCP: Mallory Bowen, MD Referring Physician: Aline August, MD  Reason for Consultation: Establishing goals of care and Psychosocial/spiritual support  HPI/Patient Profile: 83 y.o. female  with past medical history of CKD stage 4, COPD, mixed HF who was admitted on 07/19/2018 with respiratory distress. Initial work up revealed anemia (hgb 4.4 with baseline of 9.0), acute on chronic renal failure (creatinine 5.21) and CHF exacerbation.  The patient has received 3 units of blood.  Unfortunately the patient's creatinine has not improved.  Renal was consulted and conveyed that they have seen the patient several times (outpatient he has expressed that he does not want to pursue hemodialysis).  He is currently a full code.  Palliative Care was consulted for [place].  Clinical Assessment and Goals of Care:  I have reviewed medical records including EPIC notes, labs and imaging, received report from the care team, assessed the patient and discussed diagnosis prognosis, GOC, EOL wishes, disposition and options.  I introduced Palliative Medicine as specialized medical care for people living with serious illness. It focuses on providing relief from the symptoms and stress of a serious illness. The goal is to improve quality of life for both the patient and the family.  We discussed a brief life review of the patient. She has 1 Chavez with whom she is very close.  Her Chavez is her HCPOA.  She is also moving into the same housing complex with the patient.  She has 1 grand Chavez and two great grand sons.  She wants to see her great-grands one day.  Mallory Chavez is very spiritual and very active in her church.  Her pastor is Mallory Chavez of [employer].  She likes to be  busy, on the go and out around people.  She does not like to sit at home.  She loves being active at [employer].  As far as functional and nutritional status she lives in her own home independently.  She ambulates with a rolling walker.  She has been having increasing difficulty with her breathing for the past two months.   We discussed her current illness and what it means in the larger context of her on-going co-morbidities. She has great faith in her Doctors specifically Mallory Chavez and [employer] [employer].  She also has tremendous faith in God.   I attempted to elicit values and goals of care important to the patient.  The difference between aggressive medical intervention and comfort care was considered in light of the patient's goals of care.   Advanced directives, concepts specific to code status, artifical feeding and hydration, and rehospitalization were considered and discussed.  Mallory Chavez explained that she watched her mother go thru hemodialysis and she just did not want to do that.  However this month she has lost two close friends and she is not ready to go herself yet.  She is well  prepared to go when God calls her home.  Her funeral is bought and paid for.  She has openly discussed her plans with her "Mallory Chavez" Mallory Chavez as her real Chavez is reluctant to talk about dying.  Mallory Chavez has completed an advanced directive.  If she were to arrest she would not want to be resuscitated. "I don't want to put my Chavez thru that".  However if she has a pulse or is breathing she wants to come to the Chavez and receive treatment.  I attempted to obtain more thoughts from Mallory Chavez about when she would want to stop HD once it starts.  I was unable to get her to specify and functional criteria (ex.  I'll stop if I can't walk).  However, She tells me with confidence that she would rely on Mallory Chavez and [employer] [employer] to tell her when to stop hemodialysis.  Questions and concerns were addressed.    The patient was encouraged to call with questions or concerns.   Primary Decision Maker:  PATIENT.   Her HCPOA is her Chavez Mallory Chavez.    SUMMARY OF RECOMMENDATIONS    Change code status to DNR with full scope treatment. Patient wants good quality of life (to go and do) but does not have clear goals about when to stop HD.  Robaxin and heath for foot cramps.  Code Status/Advance Care Planning:  DNR   Additional Recommendations (Limitations, Scope, Preferences):  Full Scope Treatment  Palliative Prophylaxis:   Frequent Pain Assessment  Psycho-social/Spiritual:   Desire for further Chaplaincy support: yes  Prognosis:  Unable to determine    Discharge Planning: To Be Determined.  Patient would prefer to avoid SNF.      Primary Diagnoses: Present on Admission: . Symptomatic anemia . Acute on chronic combined systolic and diastolic CHF (congestive heart failure) ([place]) . Acute renal failure superimposed on stage 4 chronic kidney disease ([place]) . COPD GOLD II . Essential hypertension   I have reviewed the medical record, interviewed the patient and family, and examined the patient. The following aspects are pertinent.  Past Medical History:  Diagnosis Date  . Acute renal failure (ARF) ([place]) 06/15/2016  . Allergy   . Arthritis   . Asthma   . CHF (congestive heart failure) ([place]) 1980   controlled with meds  . Chronic respiratory failure ([place])   . CKD (chronic kidney disease)   . COPD (chronic obstructive pulmonary disease) ([place])   . GERD (gastroesophageal reflux disease)   . Hyperglycemia   . Hyperlipidemia   . Hypertension   . Hyponatremia    Social History   Socioeconomic History  . Marital status: Widowed    Spouse name: Not on file  . Number of children: 1  . Years of education: Not on file  . Highest education level: Not on file  Occupational History  . Not on file  Social Needs  . Financial resource strain: Not on file  . Food  insecurity:    Worry: Not on file    Inability: Not on file  . Transportation needs:    Medical: Not on file    Non-medical: Not on file  Tobacco Use  . Smoking status: Former Smoker    Packs/day: 1.50    Years: 20.00    Pack years: 30.00    Types: Cigarettes    Last attempt to quit: 07/02/1992    Years since quitting: 26.0  . Smokeless tobacco: Never Used  Substance and Sexual Activity  .  Alcohol use: No  . Drug use: No  . Sexual activity: Not on file  Lifestyle  . Physical activity:    Days per week: Not on file    Minutes per session: Not on file  . Stress: Not on file  Relationships  . Social connections:    Talks on phone: Not on file    Gets together: Not on file    Attends religious service: Not on file    Active member of club or organization: Not on file    Attends meetings of clubs or organizations: Not on file    Relationship status: Not on file  Other Topics Concern  . Not on file  Social History Narrative  . Not on file   Family History  Problem Relation Age of Onset  . Diabetes Mother   . Emphysema Father    Scheduled Meds: . sodium chloride   Intravenous Once  . amLODipine  10 mg Oral QPC supper  . cinacalcet  30 mg Oral Q breakfast  . diltiazem  360 mg Oral Daily  . feeding supplement  1 Container Oral TID BM  . furosemide  40 mg Oral QPC lunch  . furosemide  80 mg Oral Daily  . mometasone-formoterol  2 puff Inhalation BID  . pantoprazole  40 mg Oral Daily  . sodium bicarbonate  650 mg Oral BID  . sodium chloride flush  3 mL Intravenous Q12H   Continuous Infusions: . sodium chloride    . sodium chloride     PRN Meds:.sodium chloride, acetaminophen, ondansetron (ZOFRAN) IV, sodium chloride flush No Active Allergies Review of Systems foot and neck cramps, increase swelling, increased difficulty sleeping  Physical Exam  Well developed elderly female, awake, alert, very pleasant CV rrr resp increased work of breathing when moving or  speaking.  ++ Accessory muscle use. Abdomen soft, nt, nd, + bs Lower ext. 1+ edema  Vital Signs: BP (!) 168/79   Pulse 72   Temp 97.6 F (36.4 C) (Axillary)   Resp 17   Wt 80.5 kg   SpO2 97%   BMI 31.44 kg/m  Pain Scale: 0-10   Pain Score: 0-No pain   SpO2: SpO2: 97 % O2 Device:SpO2: 97 % O2 Flow Rate: .O2 Flow Rate (L/min): 2 L/min  IO: Intake/output summary:   Intake/Output Summary (Last 24 hours) at 07/20/2018 1208 Last data filed at 07/20/2018 0857 Gross per 24 hour  Intake 958.67 ml  Output 700 ml  Net 258.67 ml    LBM:   Baseline Weight: Weight: 80.5 kg Most recent weight: Weight: 80.5 kg     Palliative Assessment/Data: 50%     Time In: 4:45 Time Out: 6:00 Time Total: 75 mn Greater than 50%  of this time was spent counseling and coordinating care related to the above assessment and plan.  Signed by: Florentina Jenny, PA-C Palliative Medicine Pager: [phone]  Please contact Palliative Medicine Team phone at [phone] for questions and concerns.  For individual provider: See Shea Evans

## 2018-07-20 NOTE — Progress Notes (Signed)
Patient receiving her  3d unit of blood without any transfusion reactions. Lasix 40 mg IV push given in between each. Still tolerating BIPAP. Will continue to monitor.

## 2018-07-20 NOTE — Progress Notes (Signed)
Patient ID: Mallory Chavez, female   DOB: 10-21-1935, 83 y.o.   MRN: [id number]  PROGRESS NOTE    MADISSEN WYSE  JEH:[id number] DOB: 03/03/1936 DOA: 07/19/2018 PCP: [name], MD   Brief Narrative:  83 year old female with history of hypertension, chronic combined systolic and diastolic CHF, chronic kidney disease stage IV and COPD presented on 07/19/2018 with respiratory distress.  She was placed on BiPAP on arrival, creatinine was 5.21 along with hemoglobin of 4.4.  She was admitted for symptomatic anemia and acute on chronic CHF with acute kidney injury and 3 units packed red cells were ordered along with intravenous Lasix.   Assessment & Plan:   Principal Problem:   Symptomatic anemia Active Problems:   Essential hypertension   COPD GOLD II   Acute renal failure superimposed on stage 4 chronic kidney disease (HCC)   Acute on chronic combined systolic and diastolic CHF (congestive heart failure) (HCC)  Acute hypoxic respiratory failure -Probably from anemia/CHF exacerbation/fluid overload.  Required BiPAP on admission.  Wean off as able  Acute on chronic symptomatic anemia -Hemoglobin was 4.3 in the ED; baseline is around and above 9.  No complaints of  melena or hematochezia.  FOBT is pending.  Third unit packed red cell is being transfused.  Will check H&H afterwards and decide accordingly. -Spoke to Dr. Sandi Mariscal GI who recommended that we check her FOBT and for now patient would not be a candidate for any intervention from GI because of her respiratory and renal status.  Acute on chronic combined systolic and diastolic CHF -Strict input and output.  Daily weights.  Continue IV Lasix for now before and after blood transfusion.  Will hold 10 AM oral Lasix and continue 1 PM oral Lasix today.  Cardiology consulted. -Echo showed EF of 45 to 50% with grade 1 diastolic dysfunction and 3/78/5885  Acute kidney injury on chronic kidney disease stage IV -We will monitor  creatinine for today.  Creatinine was 5.21 on admission. -Spoke with Dr. Deterding/nephrology and he stated that patient has had multiple nephrology evaluation recently as outpatient but patient has been resistant to the option of dialysis.  He stated that there is nothing more to add from nephrology rather than give intravenous diuretics.  If patient changed  her mind and wanted to start dialysis, nephrology can be recalled as per him -We will call palliative care evaluation for consideration.  Hypertension -Monitor blood pressure.  Continue Norvasc and Cardizem  COPD -Continue Dulera and as needed albuterol.   DVT prophylaxis: SCDs Code Status: Full Family Communication: None at bedside Disposition Plan: Depends on clinical outcome  Consultants: Cardiology.  Spoke to Dr. Jimmy Footman and Dr. Ardis Hughs on phone as well  Procedures: None  Antimicrobials: None   Subjective: Patient seen and examined at bedside.  She feels slightly better.  She is a poor historian.  No worsening shortness of breath or chest pain reported currently.  No overnight fevers.  Objective: Vitals:   07/20/18 0612 07/20/18 0742 07/20/18 0859 07/20/18 0923  BP: (!) 146/63 (!) 144/77 (!) 168/79   Pulse:  72    Resp: 14 14 17    Temp: 97.7 F (36.5 C) 97.9 F (36.6 C) 97.6 F (36.4 C)   TempSrc: Axillary Oral Axillary   SpO2: 100% 100% 100% 97%  Weight:        Intake/Output Summary (Last 24 hours) at 07/20/2018 1001 Last data filed at 07/20/2018 0857 Gross per 24 hour  Intake 958.67 ml  Output 700  ml  Net 258.67 ml   Filed Weights   07/20/18 0430  Weight: 80.5 kg    Examination:  General exam: Elderly female lying in bed.  On BiPAP.  Poor historian Respiratory system: Bilateral decreased breath sounds at bases with scattered basilar crackles, no wheezing Cardiovascular system: S1 & S2 heard, Rate controlled Gastrointestinal system: Abdomen is nondistended, soft and nontender. Normal bowel sounds  heard. Extremities: No cyanosis, clubbing; 1+ edema Central nervous system: Alert and oriented, poor historian. No focal neurological deficits. Moving extremities Skin: No rashes, lesions or ulcers Psychiatry: Flat affect    Data Reviewed: I have personally reviewed following labs and imaging studies  CBC: Recent Labs  Lab 07/19/18 1744 07/19/18 1858  WBC 9.2 8.8  HGB 4.4* 4.3*  HCT 14.3* 14.5*  MCV 92.3 98.0  PLT 218 010   Basic Metabolic Panel: Recent Labs  Lab 07/19/18 1744  NA 135  K 4.7  CL 108  CO2 15*  GLUCOSE 145*  BUN 85*  CREATININE 5.21*  CALCIUM 9.4   GFR: Estimated Creatinine Clearance: 8.6 mL/min (A) (by C-G formula based on SCr of 5.21 mg/dL (H)). Liver Function Tests: Recent Labs  Lab 07/19/18 1744  AST 14*  ALT 12  ALKPHOS 57  BILITOT 0.4  PROT 6.2*  ALBUMIN 3.7   No results for input(s): LIPASE, AMYLASE in the last 168 hours. No results for input(s): AMMONIA in the last 168 hours. Coagulation Profile: Recent Labs  Lab 07/19/18 1744  INR 1.13   Cardiac Enzymes: No results for input(s): CKTOTAL, CKMB, CKMBINDEX, TROPONINI in the last 168 hours. BNP (last 3 results) No results for input(s): PROBNP in the last 8760 hours. HbA1C: No results for input(s): HGBA1C in the last 72 hours. CBG: No results for input(s): GLUCAP in the last 168 hours. Lipid Profile: No results for input(s): CHOL, HDL, LDLCALC, TRIG, CHOLHDL, LDLDIRECT in the last 72 hours. Thyroid Function Tests: No results for input(s): TSH, T4TOTAL, FREET4, T3FREE, THYROIDAB in the last 72 hours. Anemia Panel: No results for input(s): VITAMINB12, FOLATE, FERRITIN, TIBC, IRON, RETICCTPCT in the last 72 hours. Sepsis Labs: Recent Labs  Lab 07/19/18 1806  LATICACIDVEN 1.14    Recent Results (from the past 240 hour(s))  MRSA PCR Screening     Status: None   Collection Time: 07/19/18 10:08 PM  Result Value Ref Range Status   MRSA by PCR NEGATIVE NEGATIVE Final     Comment:        The GeneXpert MRSA Assay (FDA approved for NASAL specimens only), is one component of a comprehensive MRSA colonization surveillance program. It is not intended to diagnose MRSA infection nor to guide or monitor treatment for MRSA infections. Performed at [facility], [place] [address], [place],  [address]          Radiology Studies: Dg Chest Port 1 View  Result Date: 07/19/2018 CLINICAL DATA:  Shortness of breath for 3 days EXAM: PORTABLE CHEST 1 VIEW COMPARISON:  02/06/2018 FINDINGS: Cardiac shadow is enlarged but stable. Lungs are well aerated bilaterally. No focal infiltrate or sizable effusion is seen. No acute bony abnormality is noted. IMPRESSION: Stable cardiomegaly.  No acute abnormality noted. Electronically Signed   By: Inez Catalina M.D.   On: 07/19/2018 18:26        Scheduled Meds: . sodium chloride   Intravenous Once  . amLODipine  10 mg Oral QPC supper  . cinacalcet  30 mg Oral Q breakfast  . diltiazem  360  mg Oral Daily  . feeding supplement  1 Container Oral TID BM  . furosemide  40 mg Oral QPC lunch  . furosemide  80 mg Oral Daily  . mometasone-formoterol  2 puff Inhalation BID  . pantoprazole  40 mg Oral Daily  . sodium bicarbonate  650 mg Oral BID  . sodium chloride flush  3 mL Intravenous Q12H   Continuous Infusions: . sodium chloride    . sodium chloride       LOS: 1 day        Aline August, MD [facility] Pager [phone]  If 7PM-7AM, please contact night-coverage [email or web address] Password [email or web address] 07/20/2018, 10:01 AM

## 2018-07-20 NOTE — Consult Note (Signed)
Reason for Consult:CKD 5, CHF Referring Physician: Dr. Maryjean Ka Mallory Chavez is an 83 y.o. female.  HPI: 83 yr female known well to me with hx CKD5 from nephrosclerosis, that has been progressive.  She has refused planning for ESRD. Seen this last week with vol xs, anorexia, itching, decreased energy, DOE, edema.  Now presents with worsening of above and severe edema.  Agrees to wanting dialysis now. Constitutional: as above, poor taste also Eyes: glasses, poor vision Ears, nose, mouth, throat, and face: negative Respiratory: SOB lying down and with exertion Cardiovascular: as above Gastrointestinal: N, upset stom, anorexia Genitourinary:noct x 4 Integument/breast: itching Hematologic/lymphatic: anemia Musculoskeletal:negative Neurological: negative Endocrine: primary hyperpara Allergic/Immunologic: negative   . Primary Nephrologist Venancio Chenier   Past Medical History:  Diagnosis Date  . Acute renal failure (ARF) ([place]) 06/15/2016  . Allergy   . Arthritis   . Asthma   . CHF (congestive heart failure) ([facility]) 1980   controlled with meds  . Chronic respiratory failure ([place])   . CKD (chronic kidney disease)   . COPD (chronic obstructive pulmonary disease) ([facility])   . GERD (gastroesophageal reflux disease)   . Hyperglycemia   . Hyperlipidemia   . Hypertension   . Hyponatremia     Past Surgical History:  Procedure Laterality Date  . [place]  . TUMOR REMOVAL     lower back/benign and has come back  . VAGINAL HYSTERECTOMY      Family History  Problem Relation Age of Onset  . Diabetes Mother   . Emphysema Father     Social History:  reports that she quit smoking about 26 years ago. Her smoking use included cigarettes. She has a 30.00 pack-year smoking history. She has never used smokeless tobacco. She reports that she does not drink alcohol or use drugs.  Allergies: No Active Allergies  Medications:  I have reviewed the patient's current  medications. Prior to Admission:  Medications Prior to Admission  Medication Sig Dispense Refill Last Dose  . amLODipine (NORVASC) 10 MG tablet Take 10 mg by mouth daily after supper.    07/18/2018 at pm  . aspirin EC 81 MG tablet Take 81 mg by mouth daily after supper.   07/18/2018 at pm  . budesonide-formoterol (SYMBICORT) 160-4.5 MCG/ACT inhaler Inhale 2 puffs into the lungs 2 (two) times daily.   07/19/2018 at evening  . cinacalcet (SENSIPAR) 30 MG tablet Take 30 mg by mouth daily.   07/19/2018 at am  . colchicine 0.6 MG tablet Take 0.6 mg by mouth daily as needed (gout attack).   few days ago  . diltiazem (CARDIZEM CD) 360 MG 24 hr capsule Take 360 mg by mouth daily.  0 07/19/2018 at am  . esomeprazole (NEXIUM) 20 MG capsule Take 20 mg by mouth daily at 12 noon.   07/19/2018 at am  . furosemide (LASIX) 40 MG tablet Take 40 mg by mouth daily after lunch.   07/19/2018 at afternoon  . furosemide (LASIX) 80 MG tablet Take 80 mg by mouth daily.    07/19/2018 at am  . loratadine (CLARITIN) 10 MG tablet Take 10 mg by mouth daily after supper.    07/18/2018 at pm  . OXYGEN Inhale 2 L into the lungs See admin instructions. 2lpm with sleep and exertion  [facility]    07/19/2018 at Unknown time  . Polyvinyl Alcohol-Povidone (REFRESH OP) Place 1 drop into both eyes daily as needed (dry eyes).   07/18/2018 at Unknown time  .  PROAIR HFA 108 (90 Base) MCG/ACT inhaler INHALE 2 PUFFS EVERY 4 HOURS IF NEEDED WHEEZING SHORTNESS OF BREATH (Patient taking differently: Inhale 2 puffs into the lungs every 4 (four) hours as needed for wheezing or shortness of breath. ) 8.5 g 0 07/18/2018 at Unknown time    Results for orders placed or performed during the hospital encounter of 07/19/18 (from the past 48 hour(s))  CBC     Status: Abnormal   Collection Time: 07/19/18  5:44 PM  Result Value Ref Range   WBC 9.2 4.0 - 10.5 K/uL   RBC 1.55 (L) 3.87 - 5.11 MIL/uL   Hemoglobin 4.4 (LL) 12.0 - 15.0 g/dL    Comment: REPEATED TO  VERIFY THIS CRITICAL RESULT HAS VERIFIED AND BEEN CALLED TO RN M BARBER BY LESLIE BENFIELD ON 01 18 2020 AT 1808, AND HAS BEEN READ BACK.     HCT 14.3 (L) 36.0 - 46.0 %   MCV 92.3 80.0 - 100.0 fL   MCH 28.4 26.0 - 34.0 pg   MCHC 30.8 30.0 - 36.0 g/dL   RDW 17.1 (H) 11.5 - 15.5 %   Platelets 218 150 - 400 K/uL   nRBC 0.0 0.0 - 0.2 %    Comment: Performed at [facility] [address] [place], [place] [address]  Comprehensive metabolic panel     Status: Abnormal   Collection Time: 07/19/18  5:44 PM  Result Value Ref Range   Sodium 135 135 - 145 mmol/L   Potassium 4.7 3.5 - 5.1 mmol/L   Chloride 108 98 - 111 mmol/L   CO2 15 (L) 22 - 32 mmol/L   Glucose, Bld 145 (H) 70 - 99 mg/dL   BUN 85 (H) 8 - 23 mg/dL   Creatinine, Ser 5.21 (H) 0.44 - 1.00 mg/dL   Calcium 9.4 8.9 - 10.3 mg/dL   Total Protein 6.2 (L) 6.5 - 8.1 g/dL   Albumin 3.7 3.5 - 5.0 g/dL   AST 14 (L) 15 - 41 U/L   ALT 12 0 - 44 U/L   Alkaline Phosphatase 57 38 - 126 U/L   Total Bilirubin 0.4 0.3 - 1.2 mg/dL   GFR calc non Af Amer 7 (L) >60 mL/min   GFR calc Af Amer 8 (L) >60 mL/min   Anion gap 12 5 - 15    Comment: Performed at [facility], [place] [address] [place], [place] [address]  Protime-INR     Status: None   Collection Time: 07/19/18  5:44 PM  Result Value Ref Range   Prothrombin Time 14.4 11.4 - 15.2 seconds   INR 1.13     Comment: Performed at [facility] [address], [place], [place] [address]  Brain natriuretic peptide     Status: Abnormal   Collection Time: 07/19/18  5:44 PM  Result Value Ref Range   B Natriuretic Peptide 784.4 (H) 0.0 - 100.0 pg/mL    Comment: Performed at [facility] [address] [place],  [address]  I-stat troponin, ED     Status: None   Collection Time: 07/19/18  6:04 PM  Result Value Ref Range   Troponin i, poc 0.05 0.00 - 0.08 ng/mL   Comment 3            Comment: Due to the release kinetics of cTnI, a negative result within the first  hours of the onset of symptoms does not rule out myocardial infarction with certainty. If myocardial  infarction is still suspected, repeat the test at appropriate intervals.   I-Stat CG4 Lactic Acid, ED     Status: None   Collection Time: 07/19/18  6:06 PM  Result Value Ref Range   Lactic Acid, Venous 1.14 0.5 - 1.9 mmol/L  Type and screen [facility]     Status: None (Preliminary result)   Collection Time: 07/19/18  6:58 PM  Result Value Ref Range   ABO/RH(D) B POS    Antibody Screen NEG    Sample Expiration 07/22/2018    Unit Number [id number]    Blood Component Type RED CELLS,LR    Unit division 00    Status of Unit ISSUED    Transfusion Status OK TO TRANSFUSE    Crossmatch Result Compatible    Unit Number [id number]    Blood Component Type RED CELLS,LR    Unit division 00    Status of Unit ISSUED    Transfusion Status OK TO TRANSFUSE    Crossmatch Result Compatible    Unit Number [id number]    Blood Component Type RED CELLS,LR    Unit division 00    Status of Unit ISSUED,FINAL    Transfusion Status OK TO TRANSFUSE    Crossmatch Result      Compatible Performed at [facility], [place] [address] [place], [place] [address]    Unit Number [id number]    Blood Component Type RBC LR PHER2    Unit division 00    Status of Unit ALLOCATED    Transfusion Status OK TO TRANSFUSE    Crossmatch Result Compatible   CBC     Status: Abnormal   Collection Time: 07/19/18  6:58 PM  Result Value Ref Range   WBC 8.8 4.0 - 10.5 K/uL   RBC 1.48 (L) 3.87 - 5.11 MIL/uL   Hemoglobin 4.3 (LL) 12.0 - 15.0 g/dL    Comment: REPEATED TO VERIFY CRITICAL VALUE NOTED.  VALUE IS CONSISTENT WITH PREVIOUSLY REPORTED AND CALLED VALUE.    HCT 14.5 (L) 36.0 - 46.0 %   MCV 98.0 80.0 - 100.0 fL   MCH 29.1 26.0 - 34.0 pg   MCHC 29.7 (L) 30.0 - 36.0 g/dL   RDW 17.2 (H) 11.5 - 15.5 %   Platelets 210 150 - 400 K/uL   nRBC 0.0 0.0 - 0.2 %    Comment: Performed at [facility] [address] [place], [place] [address]  ABO/Rh     Status: None   Collection Time: 07/19/18  6:58 PM  Result Value Ref Range   ABO/RH(D)      B POS Performed at [facility] [address] [place],  [address]   I-Stat venous blood gas, ED     Status: Abnormal   Collection Time: 07/19/18  7:01 PM  Result Value Ref Range   pH, Ven 7.343 7.250 - 7.430   pCO2, Ven 27.7 (L) 44.0 - 60.0 mmHg   pO2, Ven 119.0 (H) 32.0 - 45.0 mmHg   Bicarbonate 15.0 (L) 20.0 - 28.0 mmol/L   TCO2 16 (L) 22 - 32 mmol/L   O2 Saturation 99.0 %   Acid-base deficit 10.0 (H) 0.0 - 2.0 mmol/L   Patient temperature HIDE    Sample type VENOUS   Prepare RBC     Status: None   Collection Time: 07/19/18  8:30 PM  Result Value Ref Range   Order Confirmation      ORDER PROCESSED BY BLOOD  BANK Performed at [facility], [place] [address], [place], [place] [address]   Prepare RBC     Status: None   Collection Time: 07/19/18  8:44 PM  Result Value Ref Range   Order Confirmation      ORDER PROCESSED BY BLOOD BANK Performed at [facility], [place] [address], [place], [place] [address]   MRSA PCR Screening     Status: None   Collection Time: 07/19/18 10:08 PM  Result Value Ref Range   MRSA by PCR NEGATIVE NEGATIVE    Comment:        The GeneXpert MRSA Assay (FDA approved for NASAL specimens only), is one component of a comprehensive MRSA colonization surveillance program. It is not intended to diagnose MRSA infection nor to guide or monitor treatment for MRSA infections. Performed at [facility], [place] [address] [place], [place] [address]   Prepare RBC     Status: None   Collection Time: 07/20/18 12:15 AM  Result Value Ref Range   Order Confirmation      ORDER PROCESSED BY BLOOD BANK Performed at [facility], [place] [address], [place], [place] [address]   Basic metabolic panel     Status: Abnormal   Collection Time: 07/20/18 11:03 AM  Result Value Ref  Range   Sodium 136 135 - 145 mmol/L   Potassium 4.3 3.5 - 5.1 mmol/L   Chloride 108 98 - 111 mmol/L   CO2 16 (L) 22 - 32 mmol/L   Glucose, Bld 108 (H) 70 - 99 mg/dL   BUN 79 (H) 8 - 23 mg/dL   Creatinine, Ser 5.30 (H) 0.44 - 1.00 mg/dL   Calcium 9.7 8.9 - 10.3 mg/dL   GFR calc non Af Amer 7 (L) >60 mL/min   GFR calc Af Amer 8 (L) >60 mL/min   Anion gap 12 5 - 15    Comment: Performed at [facility], [place] [address] [place], [place] [address]  Vitamin B12     Status: None   Collection Time: 07/20/18 11:03 AM  Result Value Ref Range   Vitamin B-12 869 180 - 914 pg/mL    Comment: (NOTE) This assay is not validated for testing neonatal or myeloproliferative syndrome specimens for Vitamin B12 levels. Performed at [facility], [place] [address], [place], [place] [address]   Folate     Status: None   Collection Time: 07/20/18 11:03 AM  Result Value Ref Range   Folate 17.3 >5.9 ng/mL    Comment: Performed at [facility] [address], [place], [place] [address]  Iron and TIBC     Status: Abnormal   Collection Time: 07/20/18 11:03 AM  Result Value Ref Range   Iron 169 28 - 170 ug/dL   TIBC 332 250 - 450 ug/dL   Saturation Ratios 51 (H) 10.4 - 31.8 %   UIBC 163 ug/dL    Comment: Performed at [facility], [place] [address] [place], [place] [address]  Ferritin     Status: None   Collection Time: 07/20/18 11:03 AM  Result Value Ref Range   Ferritin 35 11 - 307 ng/mL    Comment: Performed at [facility], [place] [address], [place], [place] [address]  Hemoglobin and hematocrit, blood     Status: Abnormal   Collection Time: 07/20/18 11:03 AM  Result Value Ref Range   Hemoglobin 8.3 (L) 12.0 - 15.0 g/dL  Comment: REPEATED TO VERIFY POST TRANSFUSION SPECIMEN    HCT 25.0 (L) 36.0 - 46.0 %    Comment: Performed at [facility], [place] [address], [place], [place] [address]  Reticulocytes     Status: Abnormal   Collection Time: 07/20/18 11:03 AM   Result Value Ref Range   Retic Ct Pct 3.5 (H) 0.4 - 3.1 %   RBC. 2.85 (L) 3.87 - 5.11 MIL/uL   Retic Count, Absolute 98.9 19.0 - 186.0 K/uL   Immature Retic Fract 25.2 (H) 2.3 - 15.9 %    Comment: Performed at [facility] [address], [place], [place] [address]    Dg Chest Port 1 View  Result Date: 07/19/2018 CLINICAL DATA:  Shortness of breath for 3 days EXAM: PORTABLE CHEST 1 VIEW COMPARISON:  02/06/2018 FINDINGS: Cardiac shadow is enlarged but stable. Lungs are well aerated bilaterally. No focal infiltrate or sizable effusion is seen. No acute bony abnormality is noted. IMPRESSION: Stable cardiomegaly.  No acute abnormality noted. Electronically Signed   By: Inez Catalina M.D.   On: 07/19/2018 18:26    ROS Blood pressure (!) 168/79, pulse 72, temperature 97.6 F (36.4 C), temperature source Axillary, resp. rate 17, weight 80.5 kg, SpO2 97 %. Physical Exam Physical Examination: General appearance - chronically ill, mild dyspnea Mental status - alert, oriented to person, place, and time Eyes - pupils equal and reactive, extraocular eye movements intact, HTN changes in fundi Mouth - edentulous Neck - adenopathy noted PCL Lymphatics - posterior cervical nodes Chest - rales noted bibasilar, decreased air entry noted bilat Heart - S1 and S2 normal, S4 present, systolic murmur NT7/0 at 2nd left intercostal space Abdomen - pos bs, liver down 5 cm Extremities - pedal edema 2+ + Skin - dry, neurodermatitis  Assessment/Plan: 1 Vol xs willmax Lasix until HD 2 ESRD: new , has been progressive. Refused planning,  Needs PC, perm access and Hd.Education 3 Hypertension: lower vol ,lower meds 4. Anemia of ESRD: will check Fe add ESA 5. Metabolic Bone Disease: will check, will use [facility], if  Not better PTectomy 6 GIB P esa, VVS to see, HD, max Lasix, check PTH, Fe, Hep studies, Phos  Delayla Hoffmaster 07/20/2018, 12:12 PM

## 2018-07-20 NOTE — Consult Note (Signed)
I will follow up with patient for access planning once vein mapping has been completed  Mallory Chavez

## 2018-07-20 NOTE — Progress Notes (Signed)
Robaxin changed from IV to oral by [facility] NP. Will administer and continue to monitor.

## 2018-07-20 NOTE — Consult Note (Addendum)
Cardiology Consultation:   Patient ID: Mallory Chavez MRN: [id number]; DOB: Oct 27, 1935  Admit date: 07/19/2018 Date of Consult: 07/20/2018  Primary Care Provider: [name], MD Primary Cardiologist: New Primary Electrophysiologist:  None    Patient Profile:   Mallory Chavez is a 83 y.o. female with a hx of CKD stage V, COPD, chronic combined systolic and diastolic heart failure and HTN who is being seen today for the evaluation of acute CHF at the request of Dr. Starla Link.  History of Present Illness:   Mallory Chavez is a 83 y.o. female with a hx of CKD stage V, COPD, chronic combined systolic and diastolic heart failure and HTN.  She was remotely seen by a cardiologist, however she says that she has not been seen by cardiology service more many years.  I am unable to locate any previous notes by cardiology.  She had a Myoview in December 2002 that showed no evidence of ischemia, small fixed defect involving the distal anterior apex representing either small area of scar or attenuation artifact.  Both echocardiogram obtained in April 2014 and January 2018 showed normal EF.  She has been followed by Dr. Melvyn Novas of pulmonology service for COPD.  She says she has been on nighttime and as needed home oxygen for the past 3 years.  A repeat echocardiogram was ordered by Dr. Melvyn Novas on 02/14/2018 which showed mild LVH, EF 45 to 50%, diffuse hypokinesis, grade 1 DD, mild LAE, PA peak pressure 48 mmHg.  Previous lab work in January 2018 showed creatinine of 3.45.  According to nephrologist note, she has refused consideration of dialysis.  For the past 2 weeks, she has had progressive dyspnea.  Initially her dyspnea only occurs with exertion, however later right before she came in, she started having dyspnea even while sitting down.  She also noticed bilateral lower extremity edema as well.  She is to be on 80 mg a.m. and 40 mg p.m. of Lasix, at the instruction of her PCP, she has increased Lasix to 80 mg  twice daily.  Although she did notice increased urination, however she did not notice any significant reduction in the lower extremity edema.  On arrival to [facility], it was noted that her hemoglobin was 4.4.  Repeat blood work showed a hemoglobin 4.3.  Lactic acid was normal.  Creatinine has increased to 5.2.  Troponin negative.  BNP was elevated at 784.  Patient was given 4 units of packed red blood cell.  Hemoglobin has increased to 8.3.  She was given a single dose of 40 mg IV Lasix, she says this improved her lower extremity edema.  Cardiology was consulted for acute heart failure.  Patient at this time has been seen by nephrology service, Lasix has been switched to PO 160 mg 3 times daily.   Past Medical History:  Diagnosis Date  . Acute renal failure (ARF) ([place]) 06/15/2016  . Allergy   . Arthritis   . Asthma   . CHF (congestive heart failure) ([place]) 1980   controlled with meds  . Chronic respiratory failure ([place])   . CKD (chronic kidney disease)   . COPD (chronic obstructive pulmonary disease) ([place])   . GERD (gastroesophageal reflux disease)   . Hyperglycemia   . Hyperlipidemia   . Hypertension   . Hyponatremia     Past Surgical History:  Procedure Laterality Date  . [place]  . TUMOR REMOVAL     lower back/benign and has come back  .  VAGINAL HYSTERECTOMY       Home Medications:  Prior to Admission medications   Medication Sig Start Date End Date Taking? Authorizing Provider  amLODipine (NORVASC) 10 MG tablet Take 10 mg by mouth daily after supper.    Yes [provider]  aspirin EC 81 MG tablet Take 81 mg by mouth daily after supper.   Yes [provider]  budesonide-formoterol (SYMBICORT) 160-4.5 MCG/ACT inhaler Inhale 2 puffs into the lungs 2 (two) times daily.   Yes [provider]  cinacalcet (SENSIPAR) 30 MG tablet Take 30 mg by mouth daily.   Yes [provider]  colchicine 0.6 MG tablet Take 0.6 mg  by mouth daily as needed (gout attack).   Yes [provider]  diltiazem (CARDIZEM CD) 360 MG 24 hr capsule Take 360 mg by mouth daily. 06/18/16  Yes [provider]  esomeprazole (NEXIUM) 20 MG capsule Take 20 mg by mouth daily at 12 noon.   Yes [provider]  furosemide (LASIX) 40 MG tablet Take 40 mg by mouth daily after lunch.   Yes [provider]  furosemide (LASIX) 80 MG tablet Take 80 mg by mouth daily.    Yes [provider]  loratadine (CLARITIN) 10 MG tablet Take 10 mg by mouth daily after supper.    Yes [provider]  OXYGEN Inhale 2 L into the lungs See admin instructions. 2lpm with sleep and exertion  [facility]    Yes [provider]  Polyvinyl Alcohol-Povidone (REFRESH OP) Place 1 drop into both eyes daily as needed (dry eyes).   Yes [provider]  PROAIR HFA 108 (90 Base) MCG/ACT inhaler INHALE 2 PUFFS EVERY 4 HOURS IF NEEDED WHEEZING SHORTNESS OF BREATH Patient taking differently: Inhale 2 puffs into the lungs every 4 (four) hours as needed for wheezing or shortness of breath.  06/30/18  Yes Tanda Rockers, MD    Inpatient Medications: Scheduled Meds: . sodium chloride   Intravenous Once  . amLODipine  10 mg Oral QPC supper  . cinacalcet  30 mg Oral Q breakfast  . [START ON 07/22/2018] darbepoetin (ARANESP) injection - DIALYSIS  200 mcg Intravenous Q Tue-HD  . diltiazem  360 mg Oral Daily  . feeding supplement  1 Container Oral TID BM  . furosemide  160 mg Oral TID  . mometasone-formoterol  2 puff Inhalation BID  . pantoprazole  40 mg Oral Daily  . sodium chloride flush  3 mL Intravenous Q12H   Continuous Infusions: . sodium chloride    . sodium chloride     PRN Meds: sodium chloride, acetaminophen, ondansetron (ZOFRAN) IV, sodium chloride flush  Allergies:   No Active Allergies  Social History:   Social History   Socioeconomic History  . Marital status: Widowed    Spouse name: Not on file   . Number of children: 1  . Years of education: Not on file  . Highest education level: Not on file  Occupational History  . Not on file  Social Needs  . Financial resource strain: Not on file  . Food insecurity:    Worry: Not on file    Inability: Not on file  . Transportation needs:    Medical: Not on file    Non-medical: Not on file  Tobacco Use  . Smoking status: Former Smoker    Packs/day: 1.50    Years: 20.00    Pack years: 30.00    Types: Cigarettes    Last attempt to  quit: 07/02/1992    Years since quitting: 26.0  . Smokeless tobacco: Never Used  Substance and Sexual Activity  . Alcohol use: No  . Drug use: No  . Sexual activity: Not on file  Lifestyle  . Physical activity:    Days per week: Not on file    Minutes per session: Not on file  . Stress: Not on file  Relationships  . Social connections:    Talks on phone: Not on file    Gets together: Not on file    Attends religious service: Not on file    Active member of club or organization: Not on file    Attends meetings of clubs or organizations: Not on file    Relationship status: Not on file  . Intimate partner violence:    Fear of current or ex partner: Not on file    Emotionally abused: Not on file    Physically abused: Not on file    Forced sexual activity: Not on file  Other Topics Concern  . Not on file  Social History Narrative  . Not on file    Family History:    Family History  Problem Relation Age of Onset  . Diabetes Mother   . Emphysema Father      ROS:  Please see the history of present illness.   All other ROS reviewed and negative.     Physical Exam/Data:   Vitals:   07/20/18 0742 07/20/18 0859 07/20/18 0923 07/20/18 1220  BP: (!) 144/77 (!) 168/79  (!) 165/62  Pulse: 72     Resp: 14 17  15   Temp: 97.9 F (36.6 C) 97.6 F (36.4 C)    TempSrc: Oral Axillary    SpO2: 100% 100% 97% 100%  Weight:        Intake/Output Summary (Last 24 hours) at 07/20/2018 1426 Last data  filed at 07/20/2018 1327 Gross per 24 hour  Intake 958.67 ml  Output 702 ml  Net 256.67 ml   Last 3 Weights 07/20/2018 05/19/2018 04/07/2018  Weight (lbs) 177 lb 7.5 oz 177 lb 186 lb 3.2 oz  Weight (kg) 80.5 kg 80.287 kg 84.46 kg     Body mass index is 31.44 kg/m.  General:  Well nourished, well developed, in no acute distress HEENT: normal Lymph: no adenopathy Neck: no JVD Endocrine:  No thryomegaly Vascular: No carotid bruits; FA pulses 2+ bilaterally without bruits  Cardiac:  normal S1, S2; RRR; no murmur  Lungs:  clear to auscultation bilaterally, no wheezing, rhonchi or rales  Abd: soft, nontender, no hepatomegaly  Ext: only trace amount of ankle edema Musculoskeletal:  No deformities, BUE and BLE strength normal and equal Skin: warm and dry  Neuro:  CNs 2-12 intact, no focal abnormalities noted Psych:  Normal affect   EKG:  The EKG was personally reviewed and demonstrates:  NSR with PVCs Telemetry:  Telemetry was personally reviewed and demonstrates:  NSR without significant ventricular ectopy   Relevant CV Studies:  Echo 02/14/2018 LV EF: 45% -   50% Study Conclusions  - Left ventricle: The cavity size was normal. Wall thickness was   increased in a pattern of mild LVH. Systolic function was mildly   reduced. The estimated ejection fraction was in the range of 45%   to 50%. Diffuse hypokinesis. Doppler parameters are consistent   with abnormal left ventricular relaxation (grade 1 diastolic   dysfunction). - Aortic valve: Trileaflet; mildly thickened, mildly calcified   leaflets. - Left atrium:  The atrium was mildly dilated. - Pulmonary arteries: Systolic pressure was moderately increased.   PA peak pressure: 48 mm Hg (S).  Laboratory Data:  Chemistry Recent Labs  Lab 07/19/18 1744 07/20/18 1103  NA 135 136  K 4.7 4.3  CL 108 108  CO2 15* 16*  GLUCOSE 145* 108*  BUN 85* 79*  CREATININE 5.21* 5.30*  CALCIUM 9.4 9.7  GFRNONAA 7* 7*  GFRAA 8* 8*    ANIONGAP 12 12    Recent Labs  Lab 07/19/18 1744  PROT 6.2*  ALBUMIN 3.7  AST 14*  ALT 12  ALKPHOS 57  BILITOT 0.4   Hematology Recent Labs  Lab 07/19/18 1744 07/19/18 1858 07/20/18 1103  WBC 9.2 8.8  --   RBC 1.55* 1.48* 2.85*  HGB 4.4* 4.3* 8.3*  HCT 14.3* 14.5* 25.0*  MCV 92.3 98.0  --   MCH 28.4 29.1  --   MCHC 30.8 29.7*  --   RDW 17.1* 17.2*  --   PLT 218 210  --    Cardiac EnzymesNo results for input(s): TROPONINI in the last 168 hours.  Recent Labs  Lab 07/19/18 1804  TROPIPOC 0.05    BNP Recent Labs  Lab 07/19/18 1744  BNP 784.4*    DDimer No results for input(s): DDIMER in the last 168 hours.  Radiology/Studies:  Dg Chest Port 1 View  Result Date: 07/19/2018 CLINICAL DATA:  Shortness of breath for 3 days EXAM: PORTABLE CHEST 1 VIEW COMPARISON:  02/06/2018 FINDINGS: Cardiac shadow is enlarged but stable. Lungs are well aerated bilaterally. No focal infiltrate or sizable effusion is seen. No acute bony abnormality is noted. IMPRESSION: Stable cardiomegaly.  No acute abnormality noted. Electronically Signed   By: Inez Catalina M.D.   On: 07/19/2018 18:26    Assessment and Plan:   1. Acute on chronic combined systolic and diastolic heart failure  -She used to be on 80 mg a.m. and 40 mg p.m. of Lasix, this was recently increased to 80 mg twice daily without symptomatic relief.  After admission, she received a single dose of IV 40 mg Lasix, however she says her lower extremity edema has significantly improved. I/O likely not accurate.  On physical exam, I only see trace amount of ankle edema.  Her lungs is clear.  -Heart failure symptoms likely exacerbated by symptomatic anemia which is responsible for majority of her dyspnea recently.  -She has been placed on 160 mg 3 times daily of Lasix, will monitor for urine output.  -Repeat echocardiogram.  Unclear why her EF was borderline low on previous echocardiogram, however she would not be a candidate for any  invasive cardiac work-up.  2. Symptomatic anemia: Based on internal medicine's note, it appears the case has been discussed with GI service who felt she would not be a candidate for any intervention because of her respiratory and the renal status.  3. Acute on chronic renal insufficiency: likely start on dialysis  4. COPD: on home O2 at night and as needed.  5. Hypertension: continue amlodipine, repeat echo, if EF still may, may need to switch away form diltiazem given its contraindication with LV dysfunction       For questions or updates, please contact [place] Please consult [email or web address] for contact info under     Hilbert Corrigan, [place]  07/20/2018 2:26 PM   Attending Note:   The patient was seen and examined.  Agree with assessment and plan as noted above.  Changes made to the  above note as needed.  Patient seen and independently examined with Almyra Deforest, PA .   We discussed all aspects of the encounter. I agree with the assessment and plan as stated above.  1.  Acute on chronic combined systolic and diastolic congestive heart failure: She seems to have responded well to the Lasix.  At present she does not have any leg edema and her lungs are clear.  No solution to this will will be for her to start dialysis.  With a creatinine of 5 I do not think that her kidneys will be able to keep up. Dr.  Jimmy Footman has discussed this with her and she now agrees to be worked up for dialysis.  Continue current medications for the time being.  2.  Symptomatic anemia: The patient's hemoglobin on admission was 4.  She notes black tarry stools for the past several months. She has seen GI but was thought not to be a good candidate for days of procedures. She may be a better candidate for invasive GI procedures once she is on dialysis .   I have spent a total of 40 minutes with patient reviewing hospital  notes , telemetry, EKGs, labs and examining patient as well as establishing an  assessment and plan that was discussed with the patient. > 50% of time was spent in direct patient care.    Thayer Headings, Brooke Bonito., MD, [facility] 07/20/2018, 3:05 PM [address],  [place] Pager [phone]

## 2018-07-20 NOTE — Progress Notes (Signed)
Dr. . Myna Hidalgo wrote a note to give 3 units of blood and give lasix in between each blood but didn't put the order in. 1st blood transfusion completed without any reaction. Notified Bodenheimer NP and received new orders for 3 more blood transfusion and lasix  between them. He also gave order to  hold the sodium bicarbonate for tonight. Will continue to monitor.

## 2018-07-21 ENCOUNTER — Inpatient Hospital Stay (HOSPITAL_COMMUNITY): Payer: Medicare Other

## 2018-07-21 DIAGNOSIS — I350 Nonrheumatic aortic (valve) stenosis: Secondary | ICD-10-CM

## 2018-07-21 DIAGNOSIS — N170 Acute kidney failure with tubular necrosis: Secondary | ICD-10-CM

## 2018-07-21 DIAGNOSIS — N184 Chronic kidney disease, stage 4 (severe): Secondary | ICD-10-CM

## 2018-07-21 DIAGNOSIS — N185 Chronic kidney disease, stage 5: Secondary | ICD-10-CM

## 2018-07-21 LAB — CBC WITH DIFFERENTIAL/PLATELET
Abs Immature Granulocytes: 0.06 10*3/uL (ref 0.00–0.07)
Basophils Absolute: 0 10*3/uL (ref 0.0–0.1)
Basophils Relative: 0 %
Eosinophils Absolute: 0.3 10*3/uL (ref 0.0–0.5)
Eosinophils Relative: 3 %
HCT: 25.2 % — ABNORMAL LOW (ref 36.0–46.0)
Hemoglobin: 8.6 g/dL — ABNORMAL LOW (ref 12.0–15.0)
Immature Granulocytes: 1 %
Lymphocytes Relative: 12 %
Lymphs Abs: 1.2 10*3/uL (ref 0.7–4.0)
MCH: 30.3 pg (ref 26.0–34.0)
MCHC: 34.1 g/dL (ref 30.0–36.0)
MCV: 88.7 fL (ref 80.0–100.0)
Monocytes Absolute: 1.1 10*3/uL — ABNORMAL HIGH (ref 0.1–1.0)
Monocytes Relative: 10 %
Neutro Abs: 7.7 10*3/uL (ref 1.7–7.7)
Neutrophils Relative %: 74 %
Platelets: 205 10*3/uL (ref 150–400)
RBC: 2.84 MIL/uL — ABNORMAL LOW (ref 3.87–5.11)
RDW: 16.6 % — ABNORMAL HIGH (ref 11.5–15.5)
WBC: 10.3 10*3/uL (ref 4.0–10.5)
nRBC: 0.2 % (ref 0.0–0.2)

## 2018-07-21 LAB — COMPREHENSIVE METABOLIC PANEL
ALT: 11 U/L (ref 0–44)
AST: 14 U/L — ABNORMAL LOW (ref 15–41)
Albumin: 3.5 g/dL (ref 3.5–5.0)
Alkaline Phosphatase: 62 U/L (ref 38–126)
Anion gap: 13 (ref 5–15)
BILIRUBIN TOTAL: 0.6 mg/dL (ref 0.3–1.2)
BUN: 76 mg/dL — ABNORMAL HIGH (ref 8–23)
CO2: 18 mmol/L — ABNORMAL LOW (ref 22–32)
Calcium: 9.5 mg/dL (ref 8.9–10.3)
Chloride: 108 mmol/L (ref 98–111)
Creatinine, Ser: 5.49 mg/dL — ABNORMAL HIGH (ref 0.44–1.00)
GFR calc Af Amer: 8 mL/min — ABNORMAL LOW (ref >60)
GFR, EST NON AFRICAN AMERICAN: 7 mL/min — AB (ref >60)
Glucose, Bld: 101 mg/dL — ABNORMAL HIGH (ref 70–99)
Potassium: 4.2 mmol/L (ref 3.5–5.1)
Sodium: 139 mmol/L (ref 135–145)
Total Protein: 5.8 g/dL — ABNORMAL LOW (ref 6.5–8.1)

## 2018-07-21 LAB — HEPATITIS B SURFACE ANTIBODY,QUALITATIVE: Hep B S Ab: NONREACTIVE

## 2018-07-21 LAB — HEPATITIS B CORE ANTIBODY, IGM: HEP B C IGM: NEGATIVE

## 2018-07-21 LAB — HEPATITIS B SURFACE ANTIGEN: Hepatitis B Surface Ag: NEGATIVE

## 2018-07-21 LAB — ECHOCARDIOGRAM COMPLETE: Weight: 2776.03 oz

## 2018-07-21 LAB — HCV COMMENT:

## 2018-07-21 LAB — PARATHYROID HORMONE, INTACT (NO CA): PTH: 197 pg/mL — ABNORMAL HIGH (ref 15–65)

## 2018-07-21 LAB — MAGNESIUM: MAGNESIUM: 2 mg/dL (ref 1.7–2.4)

## 2018-07-21 LAB — PHOSPHORUS: Phosphorus: 6.7 mg/dL — ABNORMAL HIGH (ref 2.5–4.6)

## 2018-07-21 LAB — HEPATITIS C ANTIBODY (REFLEX): HCV Ab: <0.1 s/co ratio (ref 0.0–0.9)

## 2018-07-21 MED ORDER — CALCIUM ACETATE (PHOS BINDER) 667 MG PO CAPS
1334.0000 mg | ORAL_CAPSULE | Freq: Three times a day (TID) | ORAL | Status: DC
  Administered 2018-07-21 – 2018-07-27 (×16): 1334 mg via ORAL
  Filled 2018-07-21 (×17): qty 2

## 2018-07-21 NOTE — Progress Notes (Signed)
Bilateral upper extremity vein mapping completed. Refer to "CV Proc" under chart review to view preliminary results.  07/21/2018 12:53 PM Maudry Mayhew, MHA, RVT, RDCS, RDMS

## 2018-07-21 NOTE — Progress Notes (Signed)
RT offered pt BIPAP and pt declined for the night. Pt order is for PRN and pt states she does not wear at home. RT will continue to monitor.

## 2018-07-21 NOTE — Progress Notes (Addendum)
Patient ID: Mallory Chavez, female   DOB: [birth year], 83 y.o.   MRN: [id number]  PROGRESS NOTE    Mallory Chavez  CHE:[id number] DOB: 11-27-1935 DOA: 07/19/2018 PCP: [name], MD   Brief Narrative:  83 year old female with history of hypertension, chronic combined systolic and diastolic CHF, chronic kidney disease stage IV and COPD presented on 07/19/2018 with respiratory distress.  She was placed on BiPAP on arrival, creatinine was 5.21 along with hemoglobin of 4.4.  She was admitted for symptomatic anemia and acute on chronic CHF with acute kidney injury and 3 units packed red cells were ordered along with intravenous Lasix.  Nephrology and cardiology were consulted.   Assessment & Plan:   Principal Problem:   Symptomatic anemia Active Problems:   Essential hypertension   COPD GOLD II   Acute renal failure superimposed on stage 4 chronic kidney disease (HCC)   Acute on chronic combined systolic and diastolic CHF (congestive heart failure) ([place])   Palliative care encounter   DNR (do not resuscitate)  Acute hypoxic respiratory failure -Probably from anemia/CHF exacerbation/fluid overload.  Required BiPAP on admission.  Currently on 2 L oxygen via nasal cannula.  Wean off as able.  Acute on chronic symptomatic anemia -Hemoglobin was 4.3 in the ED; baseline is around and above 9.  No complaints of  melena or hematochezia.  FOBT is pending.   -Status post 3 units packed red cells transfusion since admission.  Hemoglobin is 8.6 this morning.   -Spoke to Dr. Sandi Mariscal GI on phone on 07/20/2018 who recommended that we check her FOBT and for now patient would not be a candidate for any intervention from GI because of her respiratory and renal status.  Might need GI involvement once the patient starts dialysis.  Acute on chronic combined systolic and diastolic CHF -Strict input and output.  Daily weights.  Currently on Lasix 160 mg 3 times a day.  Cardiology and nephrology  following -Echo showed EF of 45 to 50% with grade 1 diastolic dysfunction and 5/36/1443  Acute kidney injury on chronic kidney disease stage IV - Creatinine was 5.21 on admission.  Creatinine is 5.49 today -Nephrology following.  Vascular surgery has been consulted for access placement.  Plans to start dialysis soon. -Palliative care consultation is pending.  CODE STATUS has been changed to DNR by palliative care.  Generalized deconditioning -Overall prognosis is guarded to poor.    Hypertension -Monitor blood pressure.  Continue Norvasc and Cardizem  COPD -Continue Dulera and as needed albuterol.   DVT prophylaxis: SCDs Code Status: DNR  family Communication: None at bedside Disposition Plan: Depends on clinical outcome  Consultants: Cardiology/nephrology.  Spoke to Dr. Elwyn Reach on 07/20/2018 on phone as well  Procedures: None  Antimicrobials: None   Subjective: Patient seen and examined at bedside.  She feels slightly better.  She is off BiPAP.  Her shortness of breath is improving.  No overnight fever, nausea, vomiting, abdominal pain or worsening shortness of breath.  Objective: Vitals:   07/21/18 0419 07/21/18 0607 07/21/18 0651 07/21/18 0735  BP:  (!) 156/66    Pulse:      Resp:  17    Temp: 98.9 F (37.2 C)     TempSrc: Oral     SpO2:    99%  Weight:   78.7 kg     Intake/Output Summary (Last 24 hours) at 07/21/2018 1016 Last data filed at 07/21/2018 0051 Gross per 24 hour  Intake -  Output 702 ml  Net -702 ml   Filed Weights   07/20/18 0430 07/21/18 0651  Weight: 80.5 kg 78.7 kg    Examination:  General exam: Elderly female lying in bed.  Off BiPAP.  No acute distress.  Poor historian Respiratory system: Bilateral decreased breath sounds at bases with basilar crackles cardiovascular system: Rate controlled, S1-S2 heard  gastrointestinal system: Abdomen is nondistended, soft and nontender. Normal bowel sounds heard. Extremities: No cyanosis; 1+  edema   Data Reviewed: I have personally reviewed following labs and imaging studies  CBC: Recent Labs  Lab 07/19/18 1744 07/19/18 1858 07/20/18 1103 07/21/18 0659  WBC 9.2 8.8  --  10.3  NEUTROABS  --   --   --  7.7  HGB 4.4* 4.3* 8.3* 8.6*  HCT 14.3* 14.5* 25.0* 25.2*  MCV 92.3 98.0  --  88.7  PLT 218 210  --  568   Basic Metabolic Panel: Recent Labs  Lab 07/19/18 1744 07/20/18 1103 07/21/18 0659  NA 135 136 139  K 4.7 4.3 4.2  CL 108 108 108  CO2 15* 16* 18*  GLUCOSE 145* 108* 101*  BUN 85* 79* 76*  CREATININE 5.21* 5.30* 5.49*  CALCIUM 9.4 9.7 9.5  MG  --   --  2.0  PHOS  --   --  6.7*   GFR: Estimated Creatinine Clearance: 8.1 mL/min (A) (by C-G formula based on SCr of 5.49 mg/dL (H)). Liver Function Tests: Recent Labs  Lab 07/19/18 1744 07/21/18 0659  AST 14* 14*  ALT 12 11  ALKPHOS 57 62  BILITOT 0.4 0.6  PROT 6.2* 5.8*  ALBUMIN 3.7 3.5   No results for input(s): LIPASE, AMYLASE in the last 168 hours. No results for input(s): AMMONIA in the last 168 hours. Coagulation Profile: Recent Labs  Lab 07/19/18 1744  INR 1.13   Cardiac Enzymes: No results for input(s): CKTOTAL, CKMB, CKMBINDEX, TROPONINI in the last 168 hours. BNP (last 3 results) No results for input(s): PROBNP in the last 8760 hours. HbA1C: No results for input(s): HGBA1C in the last 72 hours. CBG: No results for input(s): GLUCAP in the last 168 hours. Lipid Profile: No results for input(s): CHOL, HDL, LDLCALC, TRIG, CHOLHDL, LDLDIRECT in the last 72 hours. Thyroid Function Tests: No results for input(s): TSH, T4TOTAL, FREET4, T3FREE, THYROIDAB in the last 72 hours. Anemia Panel: Recent Labs    07/20/18 1103 07/20/18 1219  VITAMINB12 869  --   FOLATE 17.3  --   FERRITIN 35  --   TIBC 332 323  IRON 169 193*  RETICCTPCT 3.5*  --    Sepsis Labs: Recent Labs  Lab 07/19/18 1806  LATICACIDVEN 1.14    Recent Results (from the past 240 hour(s))  MRSA PCR Screening      Status: None   Collection Time: 07/19/18 10:08 PM  Result Value Ref Range Status   MRSA by PCR NEGATIVE NEGATIVE Final    Comment:        The GeneXpert MRSA Assay (FDA approved for NASAL specimens only), is one component of a comprehensive MRSA colonization surveillance program. It is not intended to diagnose MRSA infection nor to guide or monitor treatment for MRSA infections. Performed at [facility], [place] [address], [place], [place] [address]          Radiology Studies: Dg Chest Port 1 View  Result Date: 07/19/2018 CLINICAL DATA:  Shortness of breath for 3 days EXAM: PORTABLE CHEST 1 VIEW COMPARISON:  02/06/2018 FINDINGS: Cardiac shadow is  enlarged but stable. Lungs are well aerated bilaterally. No focal infiltrate or sizable effusion is seen. No acute bony abnormality is noted. IMPRESSION: Stable cardiomegaly.  No acute abnormality noted. Electronically Signed   By: Inez Catalina M.D.   On: 07/19/2018 18:26        Scheduled Meds: . sodium chloride   Intravenous Once  . amLODipine  10 mg Oral QPC supper  . cinacalcet  30 mg Oral Q breakfast  . [START ON 07/22/2018] darbepoetin (ARANESP) injection - DIALYSIS  200 mcg Intravenous Q Tue-HD  . diltiazem  360 mg Oral Daily  . feeding supplement  1 Container Oral TID BM  . furosemide  160 mg Oral TID  . mometasone-formoterol  2 puff Inhalation BID  . pantoprazole  40 mg Oral Daily  . sodium chloride flush  3 mL Intravenous Q12H   Continuous Infusions: . sodium chloride    . sodium chloride       LOS: 2 days        Aline August, MD [facility] Pager [phone]  If 7PM-7AM, please contact night-coverage [email or web address] Password [email or web address] 07/21/2018, 10:16 AM

## 2018-07-21 NOTE — Progress Notes (Signed)
Progress Note  Patient Name: Mallory Chavez Date of Encounter: 07/21/2018  Primary Cardiologist: Nahser   Subjective   83 year old female with a history of end-stage renal disease.  Also found to have acute on chronic combined systolic and diastolic congestive heart failure.   She was given him a dose of Lasix yesterday.  Her creatinine continues to increase.  Creatinine is 5.49 today.  Testim is 4.2.  I/O net negative 443 cc during this admission  getting echo this am     Inpatient Medications    Scheduled Meds: . sodium chloride   Intravenous Once  . amLODipine  10 mg Oral QPC supper  . cinacalcet  30 mg Oral Q breakfast  . [START ON 07/22/2018] darbepoetin (ARANESP) injection - DIALYSIS  200 mcg Intravenous Q Tue-HD  . diltiazem  360 mg Oral Daily  . feeding supplement  1 Container Oral TID BM  . furosemide  160 mg Oral TID  . mometasone-formoterol  2 puff Inhalation BID  . pantoprazole  40 mg Oral Daily  . sodium chloride flush  3 mL Intravenous Q12H   Continuous Infusions: . sodium chloride    . sodium chloride     PRN Meds: sodium chloride, acetaminophen, methocarbamol, ondansetron (ZOFRAN) IV, sodium chloride flush   Vital Signs    Vitals:   07/21/18 0419 07/21/18 0607 07/21/18 0651 07/21/18 0735  BP:  (!) 156/66    Pulse:      Resp:  17    Temp: 98.9 F (37.2 C)     TempSrc: Oral     SpO2:    99%  Weight:   78.7 kg     Intake/Output Summary (Last 24 hours) at 07/21/2018 0959 Last data filed at 07/21/2018 0051 Gross per 24 hour  Intake -  Output 702 ml  Net -702 ml   Last 3 Weights 07/21/2018 07/20/2018 05/19/2018  Weight (lbs) 173 lb 8 oz 177 lb 7.5 oz 177 lb  Weight (kg) 78.7 kg 80.5 kg 80.287 kg      Telemetry     NSR - Personally Reviewed  ECG       Physical Exam    GEN:  elderly female, nad  Neck: No JVD Cardiac: RRR,    Respiratory: Clear to auscultation bilaterally. GI: Soft, nontender, non-distended   MS: No edema; No  deformity. Neuro:  Nonfocal  Psych: Normal affect   Labs    Chemistry Recent Labs  Lab 07/19/18 1744 07/20/18 1103 07/21/18 0659  NA 135 136 139  K 4.7 4.3 4.2  CL 108 108 108  CO2 15* 16* 18*  GLUCOSE 145* 108* 101*  BUN 85* 79* 76*  CREATININE 5.21* 5.30* 5.49*  CALCIUM 9.4 9.7 9.5  PROT 6.2*  --  5.8*  ALBUMIN 3.7  --  3.5  AST 14*  --  14*  ALT 12  --  11  ALKPHOS 57  --  62  BILITOT 0.4  --  0.6  GFRNONAA 7* 7* 7*  GFRAA 8* 8* 8*  ANIONGAP 12 12 13      Hematology Recent Labs  Lab 07/19/18 1744 07/19/18 1858 07/20/18 1103 07/21/18 0659  WBC 9.2 8.8  --  10.3  RBC 1.55* 1.48* 2.85* 2.84*  HGB 4.4* 4.3* 8.3* 8.6*  HCT 14.3* 14.5* 25.0* 25.2*  MCV 92.3 98.0  --  88.7  MCH 28.4 29.1  --  30.3  MCHC 30.8 29.7*  --  34.1  RDW 17.1* 17.2*  --  16.6*  PLT 218 210  --  205    Cardiac EnzymesNo results for input(s): TROPONINI in the last 168 hours.  Recent Labs  Lab 07/19/18 1804  TROPIPOC 0.05     BNP Recent Labs  Lab 07/19/18 1744  BNP 784.4*     DDimer No results for input(s): DDIMER in the last 168 hours.   Radiology    Dg Chest Port 1 View  Result Date: 07/19/2018 CLINICAL DATA:  Shortness of breath for 3 days EXAM: PORTABLE CHEST 1 VIEW COMPARISON:  02/06/2018 FINDINGS: Cardiac shadow is enlarged but stable. Lungs are well aerated bilaterally. No focal infiltrate or sizable effusion is seen. No acute bony abnormality is noted. IMPRESSION: Stable cardiomegaly.  No acute abnormality noted. Electronically Signed   By: Inez Catalina M.D.   On: 07/19/2018 18:26    Cardiac Studies      Patient Profile     83 y.o. female with progressive renal failure   Assessment & Plan    1.  Acute on chronic systolic congestive heart failure: She was noted to have mildly reduced left ventricular systolic function during her echo in August, 2019.  Her ejection fraction was 45%.  Repeat echo is being done currently.  At this point I think there is very  little that we can offer from a medical standpoint.  I think that she needs to be started on dialysis.  She has resisted dialysis for quite some time but now seems to be agreeable.  Further plans per the nephrology team.  2.  Anemia: Patient has had black tarry stools.  I suspect that she will need a GI work-up after she is started on dialysis.      For questions or updates, please contact [place] Please consult [email or web address] for contact info under        Signed, Mertie Moores, MD  07/21/2018, 9:59 AM

## 2018-07-21 NOTE — Progress Notes (Signed)
Initial Nutrition Assessment  DOCUMENTATION CODES:   Obesity unspecified  INTERVENTION:   - Continue Boost Breeze TID (each provides 250 kcal and 9 g protein)   NUTRITION DIAGNOSIS:   Inadequate oral intake related to acute illness as evidenced by estimated needs.   GOAL:   Patient will meet greater than or equal to 90% of their needs  MONITOR:   PO intake, Supplement acceptance, Weight trends, Labs, I & O's  REASON FOR ASSESSMENT:   Malnutrition Screening Tool    ASSESSMENT:   83 yo female, admitted with symptomatic anemia. PMH significant for HTN, chronic combined systolic and diastolic CHF, CKD IV, COPD, GERD, HLD.  Labs: BUN 76, Creatinine 5.49, phosphorus 6.7, AST 14, GFR 7%/8%, Hgb 8.6, Hct 25.2% Meds: Sensipar, Boost Breeze TID, Lasix, Protonix EC  Pt resting in bed at time of visit.   Pt reports poor appetite and reduced PO intake for the past 1 week. Feeling too tired to prepare food and too tired to eat, experiencing some taste changes. Typically eats ~2 small meals daily. Family brought her microwave meals last week, so she had something easy to prepare. Does not follow any special diet and does not take vitamin/mineral supplements at home.  UBW ~179#, attributes recent wt loss to Lasix use and change in fluid status.   Denies nausea, current constipation, diarrhea, or difficulty chewing or swallowing. PTA, pt describes gagging and vomiting while eating soup for dinner, but has had no trouble since. Pt has regular daily BM.  Encouraged pt to include protein-rich foods with all meals and snacks, and to eat those foods first if not feeling hungry. Pt amenable to continuing Boost Breeze TID.  NUTRITION - FOCUSED PHYSICAL EXAM:   Most Recent Value  Orbital Region  No depletion  Upper Arm Region  No depletion  Thoracic and Lumbar Region  No depletion  Buccal Region  No depletion  Temple Region  No depletion  Clavicle Bone Region  No depletion  Clavicle and  Acromion Bone Region  No depletion  Scapular Bone Region  No depletion  Dorsal Hand  No depletion  Patellar Region  Unable to assess  Anterior Thigh Region  Unable to assess  Posterior Calf Region  Unable to assess  Edema (RD Assessment)  Unable to assess  Hair  Reviewed  Eyes  Reviewed  Mouth  Reviewed  Skin  Reviewed  Nails  Other (Comment) brittle and darker, per pt report     Diet Order:  No intake noted, per nsg documentation Diet Order            Diet renal with fluid restriction Fluid restriction: 1200 mL Fluid; Room service appropriate? Yes; Fluid consistency: Thin  Diet effective now              EDUCATION NEEDS:  Education needs have been addressed  Skin:  Skin Assessment: Reviewed RN Assessment  Last BM:  1/19  Height:  Ht Readings from Last 1 Encounters:  05/19/18 5\' 3"  (1.6 m)    Weight:  Wt Readings from Last 1 Encounters:  07/21/18 78.7 kg    Ideal Body Weight:  52.3 kg  BMI:  Body mass index is 30.73 kg/m.  Estimated Nutritional Needs:   Kcal:  7939-0300 calories daily (28-32 kcal/kg IBW)  Protein:  79-102 gm daily (1.0-1.3 g/kg ABW)  Fluid:  1200 mL  Althea Grimmer, MS, RDN, LDN Pager: [phone] Available Mondays and Fridays, 9am-2pm

## 2018-07-21 NOTE — Progress Notes (Signed)
  Echocardiogram 2D Echocardiogram has been performed.  Mallory Chavez 07/21/2018, 10:45 AM

## 2018-07-21 NOTE — Progress Notes (Signed)
Chaplain responded to spiritual care consult around lunch. Patient had staff bedside doing procedure.  "Oh, the chaplain!" she said. Chaplain said she would return at more convenient time.  "Please do," she said, [place]  Pager [phone]

## 2018-07-21 NOTE — Progress Notes (Signed)
Patient ID: Mallory Chavez, female   DOB: [birth year], 83 y.o.   MRN: [id number] [facility] Progress Note   Assessment/ Plan:   1.  Progressive chronic kidney disease now end-stage renal disease: We will initiate dialysis during this hospitalization for problems with progressive uremic symptoms and volume overload.  Appreciate assistance from vascular surgery with access planning.  We will discontinue furosemide once dialysis is started. 2.  Hypertension: Blood pressures elevated, monitor with hemodialysis/ultrafiltration. 3.  Anemia of chronic kidney disease: With adequate iron stores, continue ESA to improve hemoglobin/hematocrit.  Reports some tarry stools and will require FOBT. 4.  Secondary hyperparathyroidism: Started on cinacalcet for PTH suppression, begin phosphorus binder-calcium acetate. 5.  Volume overload/CHF exacerbation: Appears relatively refractory to diuretic therapy prompting initiation of hemodialysis.  Subjective:   Reports poor appetite and some intermittent shortness of breath.  Understands plans for dialysis.   Objective:   BP (!) 156/66 (BP Location: Right Arm)   Pulse 72   Temp 98.9 F (37.2 C) (Oral)   Resp 17   Wt 78.7 kg   SpO2 99%   BMI 30.73 kg/m   Intake/Output Summary (Last 24 hours) at 07/21/2018 1008 Last data filed at 07/21/2018 0051 Gross per 24 hour  Intake -  Output 702 ml  Net -702 ml   Weight change: -1.8 kg  Physical Exam: Gen: Comfortably resting in bed, getting echocardiogram CVS: Pulse regular rhythm, normal S1 and S2 without gallop Resp: Diminished breath sounds over bases, no distinct rales Abd: Soft, obese, nontender Ext: 1-2+ lower extremity edema  Imaging: Dg Chest Port 1 View  Result Date: 07/19/2018 CLINICAL DATA:  Shortness of breath for 3 days EXAM: PORTABLE CHEST 1 VIEW COMPARISON:  02/06/2018 FINDINGS: Cardiac shadow is enlarged but stable. Lungs are well aerated bilaterally. No focal infiltrate or  sizable effusion is seen. No acute bony abnormality is noted. IMPRESSION: Stable cardiomegaly.  No acute abnormality noted. Electronically Signed   By: Inez Catalina M.D.   On: 07/19/2018 18:26    Labs: BMET Recent Labs  Lab 07/19/18 1744 07/20/18 1103 07/21/18 0659  NA 135 136 139  K 4.7 4.3 4.2  CL 108 108 108  CO2 15* 16* 18*  GLUCOSE 145* 108* 101*  BUN 85* 79* 76*  CREATININE 5.21* 5.30* 5.49*  CALCIUM 9.4 9.7 9.5  PHOS  --   --  6.7*   CBC Recent Labs  Lab 07/19/18 1744 07/19/18 1858 07/20/18 1103 07/21/18 0659  WBC 9.2 8.8  --  10.3  NEUTROABS  --   --   --  7.7  HGB 4.4* 4.3* 8.3* 8.6*  HCT 14.3* 14.5* 25.0* 25.2*  MCV 92.3 98.0  --  88.7  PLT 218 210  --  205    Medications:    . sodium chloride   Intravenous Once  . amLODipine  10 mg Oral QPC supper  . cinacalcet  30 mg Oral Q breakfast  . [START ON 07/22/2018] darbepoetin (ARANESP) injection - DIALYSIS  200 mcg Intravenous Q Tue-HD  . diltiazem  360 mg Oral Daily  . feeding supplement  1 Container Oral TID BM  . furosemide  160 mg Oral TID  . mometasone-formoterol  2 puff Inhalation BID  . pantoprazole  40 mg Oral Daily  . sodium chloride flush  3 mL Intravenous Q12H   Elmarie Shiley, MD 07/21/2018, 10:08 AM

## 2018-07-22 DIAGNOSIS — N184 Chronic kidney disease, stage 4 (severe): Secondary | ICD-10-CM

## 2018-07-22 DIAGNOSIS — I5042 Chronic combined systolic (congestive) and diastolic (congestive) heart failure: Secondary | ICD-10-CM

## 2018-07-22 DIAGNOSIS — I10 Essential (primary) hypertension: Secondary | ICD-10-CM

## 2018-07-22 DIAGNOSIS — J449 Chronic obstructive pulmonary disease, unspecified: Secondary | ICD-10-CM

## 2018-07-22 LAB — CBC WITH DIFFERENTIAL/PLATELET
Abs Immature Granulocytes: 0.05 10*3/uL (ref 0.00–0.07)
Basophils Absolute: 0 10*3/uL (ref 0.0–0.1)
Basophils Relative: 0 %
EOS ABS: 0.3 10*3/uL (ref 0.0–0.5)
Eosinophils Relative: 3 %
HCT: 28.5 % — ABNORMAL LOW (ref 36.0–46.0)
Hemoglobin: 9.1 g/dL — ABNORMAL LOW (ref 12.0–15.0)
Immature Granulocytes: 0 %
Lymphocytes Relative: 14 %
Lymphs Abs: 1.6 10*3/uL (ref 0.7–4.0)
MCH: 28.7 pg (ref 26.0–34.0)
MCHC: 31.9 g/dL (ref 30.0–36.0)
MCV: 89.9 fL (ref 80.0–100.0)
Monocytes Absolute: 1.2 10*3/uL — ABNORMAL HIGH (ref 0.1–1.0)
Monocytes Relative: 10 %
Neutro Abs: 8.4 10*3/uL — ABNORMAL HIGH (ref 1.7–7.7)
Neutrophils Relative %: 73 %
Platelets: 217 10*3/uL (ref 150–400)
RBC: 3.17 MIL/uL — ABNORMAL LOW (ref 3.87–5.11)
RDW: 16.6 % — AB (ref 11.5–15.5)
WBC: 11.5 10*3/uL — ABNORMAL HIGH (ref 4.0–10.5)
nRBC: 0 % (ref 0.0–0.2)

## 2018-07-22 LAB — RENAL FUNCTION PANEL
Albumin: 3.4 g/dL — ABNORMAL LOW (ref 3.5–5.0)
Anion gap: 11 (ref 5–15)
BUN: 76 mg/dL — ABNORMAL HIGH (ref 8–23)
CALCIUM: 9.9 mg/dL (ref 8.9–10.3)
CO2: 19 mmol/L — ABNORMAL LOW (ref 22–32)
Chloride: 108 mmol/L (ref 98–111)
Creatinine, Ser: 5.61 mg/dL — ABNORMAL HIGH (ref 0.44–1.00)
GFR calc Af Amer: 8 mL/min — ABNORMAL LOW (ref >60)
GFR calc non Af Amer: 7 mL/min — ABNORMAL LOW (ref >60)
Glucose, Bld: 107 mg/dL — ABNORMAL HIGH (ref 70–99)
Phosphorus: 6.3 mg/dL — ABNORMAL HIGH (ref 2.5–4.6)
Potassium: 4 mmol/L (ref 3.5–5.1)
SODIUM: 138 mmol/L (ref 135–145)

## 2018-07-22 LAB — MAGNESIUM: Magnesium: 1.9 mg/dL (ref 1.7–2.4)

## 2018-07-22 MED ORDER — HEPARIN SODIUM (PORCINE) 5000 UNIT/ML IJ SOLN
5000.0000 [IU] | Freq: Three times a day (TID) | INTRAMUSCULAR | Status: DC
  Administered 2018-07-22 – 2018-07-28 (×17): 5000 [IU] via SUBCUTANEOUS
  Filled 2018-07-22 (×16): qty 1

## 2018-07-22 MED ORDER — DARBEPOETIN ALFA 200 MCG/0.4ML IJ SOSY
200.0000 ug | PREFILLED_SYRINGE | INTRAMUSCULAR | Status: DC
  Administered 2018-07-22: 200 ug via SUBCUTANEOUS
  Filled 2018-07-22: qty 0.4

## 2018-07-22 MED ORDER — SODIUM CHLORIDE 0.9 % IV SOLN
1.5000 g | INTRAVENOUS | Status: AC
  Filled 2018-07-22 (×2): qty 1.5

## 2018-07-22 NOTE — Progress Notes (Signed)
Patient ID: Mallory Chavez, female   DOB: Oct 13, 1935, 83 y.o.   MRN: [id number]  PROGRESS NOTE    REBIE PEALE  QPY:[id number] DOB: [birth year] DOA: 07/19/2018 PCP: [name], MD   Brief Narrative:  83 year old female with history of hypertension, chronic combined systolic and diastolic CHF, chronic kidney disease stage IV and COPD presented on 07/19/2018 with respiratory distress.  She was placed on BiPAP on arrival, creatinine was 5.21 along with hemoglobin of 4.4.  She was admitted for symptomatic anemia and acute on chronic CHF with acute kidney injury and 3 units packed red cells were ordered along with intravenous Lasix.  Nephrology and cardiology were consulted.   Assessment & Plan:   Principal Problem:   Symptomatic anemia Active Problems:   Essential hypertension   COPD GOLD II   Acute renal failure superimposed on stage 4 chronic kidney disease (HCC)   Acute on chronic combined systolic and diastolic CHF (congestive heart failure) ([place])   Palliative care encounter   DNR (do not resuscitate)  Acute hypoxic respiratory failure -Probably from anemia/CHF exacerbation/fluid overload.  Required BiPAP on admission.  Currently on 2 L oxygen via nasal cannula.  Wean off as able.  Acute on chronic symptomatic anemia -Hemoglobin was 4.3 in the ED; baseline is around and above 9.  No complaints of  melena or hematochezia.    -Status post 3 units packed red cells transfusion since admission.  Hemoglobin is 9.1 this morning.   -Spoke to Dr. Sandi Mariscal GI on phone on 07/20/2018 who recommended that for now patient would not be a candidate for any intervention from GI because of her respiratory and renal status.  Might need GI involvement once the patient starts dialysis, either inpatient or outpatient.  Acute on chronic combined systolic and diastolic CHF -Strict input and output.  Daily weights.  Currently on Lasix 160 mg 3 times a day.  Cardiology and nephrology  following -Echo showed EF of 45 to 50% with grade 1 diastolic dysfunction and 07/26/5807  Acute kidney injury on chronic kidney disease stage IV-V/no end-stage renal disease - Creatinine was 5.21 on admission.  Creatinine is 5.61 today -Nephrology following.  Vascular surgery is planning for access placement sometime this week.  Plans to start dialysis soon. -Palliative care evaluation appreciated.  Generalized deconditioning -Overall prognosis is guarded to poor.    Hypertension -Monitor blood pressure.  Continue Norvasc and Cardizem  COPD -Continue Dulera and as needed albuterol.   DVT prophylaxis: SCDs.  Will start heparin for DVT prophylaxis as there is no evidence of any overt GI blood loss Code Status: DNR  family Communication: None at bedside Disposition Plan: Depends on clinical outcome  Consultants: Cardiology/nephrology/vascular surgery.  Spoke to Dr. Elwyn Reach on 07/20/2018 on phone as well  Procedures: None  Antimicrobials: None   Subjective: Patient seen and examined at bedside.  She feels slightly better.  Her breathing is improving.  No overnight fever, nausea, vomiting or chest pain.  Objective: Vitals:   07/21/18 2139 07/21/18 2213 07/22/18 0740 07/22/18 0752  BP:      Pulse: 82 80    Resp: 16     Temp:  99.1 F (37.3 C)    TempSrc:  Oral    SpO2: 100% 99% 95% 95%  Weight:   76 kg    No intake or output data in the 24 hours ending 07/22/18 0937 Filed Weights   07/20/18 0430 07/21/18 0651 07/22/18 0740  Weight: 80.5 kg 78.7 kg 76 kg  Examination:  General exam: Elderly female lying in bed.  No distress. Respiratory system: Bilateral decreased breath sounds at bases with basilar crackles, no wheezing  cardiovascular system: S1-S2 heard, rate controlled gastrointestinal system: Abdomen is nondistended, soft and nontender. Normal bowel sounds heard. Extremities: No cyanosis; 1+ edema   Data Reviewed: I have personally reviewed following labs  and imaging studies  CBC: Recent Labs  Lab 07/19/18 1744 07/19/18 1858 07/20/18 1103 07/21/18 0659 07/22/18 0445  WBC 9.2 8.8  --  10.3 11.5*  NEUTROABS  --   --   --  7.7 8.4*  HGB 4.4* 4.3* 8.3* 8.6* 9.1*  HCT 14.3* 14.5* 25.0* 25.2* 28.5*  MCV 92.3 98.0  --  88.7 89.9  PLT 218 210  --  205 151   Basic Metabolic Panel: Recent Labs  Lab 07/19/18 1744 07/20/18 1103 07/21/18 0659 07/22/18 0445  NA 135 136 139 138  K 4.7 4.3 4.2 4.0  CL 108 108 108 108  CO2 15* 16* 18* 19*  GLUCOSE 145* 108* 101* 107*  BUN 85* 79* 76* 76*  CREATININE 5.21* 5.30* 5.49* 5.61*  CALCIUM 9.4 9.7 9.5 9.9  MG  --   --  2.0 1.9  PHOS  --   --  6.7* 6.3*   GFR: Estimated Creatinine Clearance: 7.8 mL/min (A) (by C-G formula based on SCr of 5.61 mg/dL (H)). Liver Function Tests: Recent Labs  Lab 07/19/18 1744 07/21/18 0659 07/22/18 0445  AST 14* 14*  --   ALT 12 11  --   ALKPHOS 57 62  --   BILITOT 0.4 0.6  --   PROT 6.2* 5.8*  --   ALBUMIN 3.7 3.5 3.4*   No results for input(s): LIPASE, AMYLASE in the last 168 hours. No results for input(s): AMMONIA in the last 168 hours. Coagulation Profile: Recent Labs  Lab 07/19/18 1744  INR 1.13   Cardiac Enzymes: No results for input(s): CKTOTAL, CKMB, CKMBINDEX, TROPONINI in the last 168 hours. BNP (last 3 results) No results for input(s): PROBNP in the last 8760 hours. HbA1C: No results for input(s): HGBA1C in the last 72 hours. CBG: No results for input(s): GLUCAP in the last 168 hours. Lipid Profile: No results for input(s): CHOL, HDL, LDLCALC, TRIG, CHOLHDL, LDLDIRECT in the last 72 hours. Thyroid Function Tests: No results for input(s): TSH, T4TOTAL, FREET4, T3FREE, THYROIDAB in the last 72 hours. Anemia Panel: Recent Labs    07/20/18 1103 07/20/18 1219  VITAMINB12 869  --   FOLATE 17.3  --   FERRITIN 35  --   TIBC 332 323  IRON 169 193*  RETICCTPCT 3.5*  --    Sepsis Labs: Recent Labs  Lab 07/19/18 1806   LATICACIDVEN 1.14    Recent Results (from the past 240 hour(s))  MRSA PCR Screening     Status: None   Collection Time: 07/19/18 10:08 PM  Result Value Ref Range Status   MRSA by PCR NEGATIVE NEGATIVE Final    Comment:        The GeneXpert MRSA Assay (FDA approved for NASAL specimens only), is one component of a comprehensive MRSA colonization surveillance program. It is not intended to diagnose MRSA infection nor to guide or monitor treatment for MRSA infections. Performed at [facility], [place] [address], [place], [place] [address]          Radiology Studies: Vas [place] Upper Ext Vein Mapping (pre-op Avf)  Result Date: 07/21/2018 UPPER EXTREMITY VEIN MAPPING  Indications: Pre-access.  Performing Technologist: Maudry Mayhew MHA, RDMS, RVT, RDCS  Examination Guidelines: A complete evaluation includes B-mode imaging, spectral Doppler, color Doppler, and power Doppler as needed of all accessible portions of each vessel. Bilateral testing is considered an integral part of a complete examination. Limited examinations for reoccurring indications may be performed as noted. +-----------------+-------------+----------+--------------+ Right Cephalic   Diameter (cm)Depth (cm)   Findings    +-----------------+-------------+----------+--------------+ Shoulder             0.28        1.00                  +-----------------+-------------+----------+--------------+ Prox upper arm       0.12        0.56                  +-----------------+-------------+----------+--------------+ Mid upper arm        0.15        0.57                  +-----------------+-------------+----------+--------------+ Dist upper arm       0.09        0.49                  +-----------------+-------------+----------+--------------+ Antecubital fossa                       not visualized +-----------------+-------------+----------+--------------+ Prox forearm                            not  visualized +-----------------+-------------+----------+--------------+ Mid forearm                             not visualized +-----------------+-------------+----------+--------------+ Dist forearm                            not visualized +-----------------+-------------+----------+--------------+ Wrist                                   not visualized +-----------------+-------------+----------+--------------+ +-----------------+-------------+----------+--------------+ Right Basilic    Diameter (cm)Depth (cm)   Findings    +-----------------+-------------+----------+--------------+ Shoulder                                not visualized +-----------------+-------------+----------+--------------+ Prox upper arm                          not visualized +-----------------+-------------+----------+--------------+ Mid upper arm        0.40                              +-----------------+-------------+----------+--------------+ Dist upper arm       0.34                 branching    +-----------------+-------------+----------+--------------+ Antecubital fossa    0.23                              +-----------------+-------------+----------+--------------+ Prox forearm         0.17                              +-----------------+-------------+----------+--------------+  Mid forearm                             not visualized +-----------------+-------------+----------+--------------+ Distal forearm                          not visualized +-----------------+-------------+----------+--------------+ Wrist                                   not visualized +-----------------+-------------+----------+--------------+ +-----------------+-------------+----------+---------------------------------+ Left Cephalic    Diameter (cm)Depth (cm)            Findings              +-----------------+-------------+----------+---------------------------------+ Shoulder              0.29        0.71                                     +-----------------+-------------+----------+---------------------------------+ Prox upper arm       0.31        0.42                                     +-----------------+-------------+----------+---------------------------------+ Mid upper arm        0.32        0.43                                     +-----------------+-------------+----------+---------------------------------+ Dist upper arm       0.28        0.48                                     +-----------------+-------------+----------+---------------------------------+ Antecubital fossa    0.27        0.38                                     +-----------------+-------------+----------+---------------------------------+ Prox forearm         0.17        0.62                                     +-----------------+-------------+----------+---------------------------------+ Mid forearm                             not visualized and IV at location +-----------------+-------------+----------+---------------------------------+ Dist forearm         0.12        0.20                                     +-----------------+-------------+----------+---------------------------------+ Wrist  not visualized           +-----------------+-------------+----------+---------------------------------+ +-----------------+-------------+----------+--------------+ Left Basilic     Diameter (cm)Depth (cm)   Findings    +-----------------+-------------+----------+--------------+ Prox upper arm       0.34                              +-----------------+-------------+----------+--------------+ Mid upper arm        0.26                              +-----------------+-------------+----------+--------------+ Dist upper arm       0.31                 branching    +-----------------+-------------+----------+--------------+  Antecubital fossa    0.20                              +-----------------+-------------+----------+--------------+ Prox forearm         0.21                              +-----------------+-------------+----------+--------------+ Mid forearm          0.21                              +-----------------+-------------+----------+--------------+ Distal forearm       0.17                              +-----------------+-------------+----------+--------------+ Wrist                                   not visualized +-----------------+-------------+----------+--------------+ *See table(s) above for measurements and observations.  Diagnosing physician: Ruta Hinds MD Electronically signed by Ruta Hinds MD on 07/21/2018 at 5:25:53 PM.    Final         Scheduled Meds: . amLODipine  10 mg Oral QPC supper  . calcium acetate  1,334 mg Oral TID WC  . cinacalcet  30 mg Oral Q breakfast  . darbepoetin (ARANESP) injection - DIALYSIS  200 mcg Intravenous Q Tue-HD  . diltiazem  360 mg Oral Daily  . feeding supplement  1 Container Oral TID BM  . furosemide  160 mg Oral TID  . mometasone-formoterol  2 puff Inhalation BID  . pantoprazole  40 mg Oral Daily  . sodium chloride flush  3 mL Intravenous Q12H   Continuous Infusions: . sodium chloride    . sodium chloride       LOS: 3 days        Aline August, MD [facility] Pager [phone]  If 7PM-7AM, please contact night-coverage [email or web address] Password [facility] 07/22/2018, 9:37 AM

## 2018-07-22 NOTE — Progress Notes (Signed)
Patient ID: Mallory Chavez, female   DOB: 12-25-1935, 83 y.o.   MRN: [id number] [facility] Progress Note   Assessment/ Plan:   1.  Progressive chronic kidney disease now end-stage renal disease: With progressive development of uremic symptoms and volume overload in the setting of advancing chronic kidney disease for which we will begin dialysis.  Seen earlier today by vascular surgery with plans noted for likely fistula placement in tandem with TDC. 2.  Hypertension: Blood pressures slightly elevated, monitor with ongoing diuretics and thereafter HD/UF. 3.  Anemia of chronic kidney disease: With adequate iron stores, continue ESA to improve hemoglobin/hematocrit.   4.  Secondary hyperparathyroidism: On cinacalcet for PTH suppression, continue calcium acetate for phosphorus binding. 5.  Volume overload/CHF exacerbation: Appears relatively refractory to diuretic therapy prompting initiation of hemodialysis.  Subjective:   Reports to be in fair, still having poor appetite and some dysgeusia.   Objective:   BP (!) 142/76 (BP Location: Left Arm)   Pulse 80   Temp 99.1 F (37.3 C) (Oral)   Resp 16   Wt 76 kg   SpO2 95%   BMI 29.68 kg/m  No intake or output data in the 24 hours ending 07/22/18 0841 Weight change:   Physical Exam: Gen: Comfortably resting in bed, getting echocardiogram CVS: Pulse regular rhythm, normal S1 and S2 without gallop Resp: Diminished breath sounds over bases, no distinct rales Abd: Soft, obese, nontender Ext: 1-2+ lower extremity edema  Imaging: Vas [place] Upper Ext Vein Mapping (pre-op Avf)  Result Date: 07/21/2018 UPPER EXTREMITY VEIN MAPPING  Indications: Pre-access. Performing Technologist: Maudry Mayhew MHA, RDMS, RVT, RDCS  Examination Guidelines: A complete evaluation includes B-mode imaging, spectral Doppler, color Doppler, and power Doppler as needed of all accessible portions of each vessel. Bilateral testing is considered an  integral part of a complete examination. Limited examinations for reoccurring indications may be performed as noted. +-----------------+-------------+----------+--------------+ Right Cephalic   Diameter (cm)Depth (cm)   Findings    +-----------------+-------------+----------+--------------+ Shoulder             0.28        1.00                  +-----------------+-------------+----------+--------------+ Prox upper arm       0.12        0.56                  +-----------------+-------------+----------+--------------+ Mid upper arm        0.15        0.57                  +-----------------+-------------+----------+--------------+ Dist upper arm       0.09        0.49                  +-----------------+-------------+----------+--------------+ Antecubital fossa                       not visualized +-----------------+-------------+----------+--------------+ Prox forearm                            not visualized +-----------------+-------------+----------+--------------+ Mid forearm                             not visualized +-----------------+-------------+----------+--------------+ Dist forearm  not visualized +-----------------+-------------+----------+--------------+ Wrist                                   not visualized +-----------------+-------------+----------+--------------+ +-----------------+-------------+----------+--------------+ Right Basilic    Diameter (cm)Depth (cm)   Findings    +-----------------+-------------+----------+--------------+ Shoulder                                not visualized +-----------------+-------------+----------+--------------+ Prox upper arm                          not visualized +-----------------+-------------+----------+--------------+ Mid upper arm        0.40                              +-----------------+-------------+----------+--------------+ Dist upper arm       0.34                  branching    +-----------------+-------------+----------+--------------+ Antecubital fossa    0.23                              +-----------------+-------------+----------+--------------+ Prox forearm         0.17                              +-----------------+-------------+----------+--------------+ Mid forearm                             not visualized +-----------------+-------------+----------+--------------+ Distal forearm                          not visualized +-----------------+-------------+----------+--------------+ Wrist                                   not visualized +-----------------+-------------+----------+--------------+ +-----------------+-------------+----------+---------------------------------+ Left Cephalic    Diameter (cm)Depth (cm)            Findings              +-----------------+-------------+----------+---------------------------------+ Shoulder             0.29        0.71                                     +-----------------+-------------+----------+---------------------------------+ Prox upper arm       0.31        0.42                                     +-----------------+-------------+----------+---------------------------------+ Mid upper arm        0.32        0.43                                     +-----------------+-------------+----------+---------------------------------+ Dist upper arm       0.28        0.48                                     +-----------------+-------------+----------+---------------------------------+  Antecubital fossa    0.27        0.38                                     +-----------------+-------------+----------+---------------------------------+ Prox forearm         0.17        0.62                                     +-----------------+-------------+----------+---------------------------------+ Mid forearm                             not visualized and IV at  location +-----------------+-------------+----------+---------------------------------+ Dist forearm         0.12        0.20                                     +-----------------+-------------+----------+---------------------------------+ Wrist                                            not visualized           +-----------------+-------------+----------+---------------------------------+ +-----------------+-------------+----------+--------------+ Left Basilic     Diameter (cm)Depth (cm)   Findings    +-----------------+-------------+----------+--------------+ Prox upper arm       0.34                              +-----------------+-------------+----------+--------------+ Mid upper arm        0.26                              +-----------------+-------------+----------+--------------+ Dist upper arm       0.31                 branching    +-----------------+-------------+----------+--------------+ Antecubital fossa    0.20                              +-----------------+-------------+----------+--------------+ Prox forearm         0.21                              +-----------------+-------------+----------+--------------+ Mid forearm          0.21                              +-----------------+-------------+----------+--------------+ Distal forearm       0.17                              +-----------------+-------------+----------+--------------+ Wrist                                   not visualized +-----------------+-------------+----------+--------------+ *See table(s) above for measurements and observations.  Diagnosing physician: Ruta Hinds MD Electronically signed by Juanda Crumble  Fields MD on 07/21/2018 at 5:25:53 PM.    Final     Labs: BMET Recent Labs  Lab 07/19/18 1744 07/20/18 1103 07/21/18 0659 07/22/18 0445  NA 135 136 139 138  K 4.7 4.3 4.2 4.0  CL 108 108 108 108  CO2 15* 16* 18* 19*  GLUCOSE 145* 108* 101* 107*  BUN 85* 79*  76* 76*  CREATININE 5.21* 5.30* 5.49* 5.61*  CALCIUM 9.4 9.7 9.5 9.9  PHOS  --   --  6.7* 6.3*   CBC Recent Labs  Lab 07/19/18 1744 07/19/18 1858 07/20/18 1103 07/21/18 0659 07/22/18 0445  WBC 9.2 8.8  --  10.3 11.5*  NEUTROABS  --   --   --  7.7 8.4*  HGB 4.4* 4.3* 8.3* 8.6* 9.1*  HCT 14.3* 14.5* 25.0* 25.2* 28.5*  MCV 92.3 98.0  --  88.7 89.9  PLT 218 210  --  205 217    Medications:    . amLODipine  10 mg Oral QPC supper  . calcium acetate  1,334 mg Oral TID WC  . cinacalcet  30 mg Oral Q breakfast  . darbepoetin (ARANESP) injection - DIALYSIS  200 mcg Intravenous Q Tue-HD  . diltiazem  360 mg Oral Daily  . feeding supplement  1 Container Oral TID BM  . furosemide  160 mg Oral TID  . mometasone-formoterol  2 puff Inhalation BID  . pantoprazole  40 mg Oral Daily  . sodium chloride flush  3 mL Intravenous Q12H   Elmarie Shiley, MD 07/22/2018, 8:41 AM

## 2018-07-22 NOTE — Progress Notes (Signed)
Progress Note  Patient Name: Mallory Chavez Date of Encounter: 07/22/2018  Primary Cardiologist: Yolunda Kloos   Subjective   83 year old female with a history of end-stage renal disease.  Also found to have acute on chronic combined systolic and diastolic congestive heart failure.   She was given him a dose of Lasix yesterday.  Her creatinine continues to increase.    She has gradually increasing creatinine.   Has been resistant to high dose lasix   Inpatient Medications    Scheduled Meds: . amLODipine  10 mg Oral QPC supper  . calcium acetate  1,334 mg Oral TID WC  . cinacalcet  30 mg Oral Q breakfast  . darbepoetin (ARANESP) injection - NON-DIALYSIS  200 mcg Subcutaneous Q Tue-1800  . diltiazem  360 mg Oral Daily  . feeding supplement  1 Container Oral TID BM  . furosemide  160 mg Oral TID  . heparin injection (subcutaneous)  5,000 Units Subcutaneous Q8H  . mometasone-formoterol  2 puff Inhalation BID  . pantoprazole  40 mg Oral Daily  . sodium chloride flush  3 mL Intravenous Q12H   Continuous Infusions: . sodium chloride    . sodium chloride    . [START ON 07/23/2018] cefUROXime (ZINACEF)  IV     PRN Meds: sodium chloride, acetaminophen, methocarbamol, ondansetron (ZOFRAN) IV, sodium chloride flush   Vital Signs    Vitals:   07/21/18 2213 07/22/18 0740 07/22/18 0745 07/22/18 0752  BP:   (!) 147/82   Pulse: 80  80   Resp:   16   Temp: 99.1 F (37.3 C)  98.1 F (36.7 C)   TempSrc: Oral  Oral   SpO2: 99% 95% 95% 95%  Weight:  76 kg     No intake or output data in the 24 hours ending 07/22/18 1056 Last 3 Weights 07/22/2018 07/21/2018 07/20/2018  Weight (lbs) 167 lb 8.8 oz 173 lb 8 oz 177 lb 7.5 oz  Weight (kg) 76 kg 78.7 kg 80.5 kg      Telemetry     NSR - Personally Reviewed  ECG       Physical Exam    Physical Exam: Blood pressure (!) 147/82, pulse 80, temperature 98.1 F (36.7 C), temperature source Oral, resp. rate 16, weight 76 kg, SpO2 95  %.  GEN:   Elderly female,  HEENT: Normal NECK: No JVD; No carotid bruits LYMPHATICS: No lymphadenopathy CARDIAC: RRR  , soft systolic murmur  RESPIRATORY:  Clear to auscultation without rales, wheezing or rhonchi  ABDOMEN: Soft, non-tender, non-distended MUSCULOSKELETAL:  No edema; No deformity  SKIN: Warm and dry NEUROLOGIC:  Alert and oriented x 3   Labs    Chemistry Recent Labs  Lab 07/19/18 1744 07/20/18 1103 07/21/18 0659 07/22/18 0445  NA 135 136 139 138  K 4.7 4.3 4.2 4.0  CL 108 108 108 108  CO2 15* 16* 18* 19*  GLUCOSE 145* 108* 101* 107*  BUN 85* 79* 76* 76*  CREATININE 5.21* 5.30* 5.49* 5.61*  CALCIUM 9.4 9.7 9.5 9.9  PROT 6.2*  --  5.8*  --   ALBUMIN 3.7  --  3.5 3.4*  AST 14*  --  14*  --   ALT 12  --  11  --   ALKPHOS 57  --  62  --   BILITOT 0.4  --  0.6  --   GFRNONAA 7* 7* 7* 7*  GFRAA 8* 8* 8* 8*  ANIONGAP 12 12 13  11  Hematology Recent Labs  Lab 07/19/18 1858 07/20/18 1103 07/21/18 0659 07/22/18 0445  WBC 8.8  --  10.3 11.5*  RBC 1.48* 2.85* 2.84* 3.17*  HGB 4.3* 8.3* 8.6* 9.1*  HCT 14.5* 25.0* 25.2* 28.5*  MCV 98.0  --  88.7 89.9  MCH 29.1  --  30.3 28.7  MCHC 29.7*  --  34.1 31.9  RDW 17.2*  --  16.6* 16.6*  PLT 210  --  205 217    Cardiac EnzymesNo results for input(s): TROPONINI in the last 168 hours.  Recent Labs  Lab 07/19/18 1804  TROPIPOC 0.05     BNP Recent Labs  Lab 07/19/18 1744  BNP 784.4*     DDimer No results for input(s): DDIMER in the last 168 hours.   Radiology    Vas [place] Upper Ext Vein Mapping (pre-op Avf)  Result Date: 07/21/2018 UPPER EXTREMITY VEIN MAPPING  Indications: Pre-access. Performing Technologist: Maudry Mayhew MHA, RDMS, RVT, RDCS  Examination Guidelines: A complete evaluation includes B-mode imaging, spectral Doppler, color Doppler, and power Doppler as needed of all accessible portions of each vessel. Bilateral testing is considered an integral part of a complete examination.  Limited examinations for reoccurring indications may be performed as noted. +-----------------+-------------+----------+--------------+ Right Cephalic   Diameter (cm)Depth (cm)   Findings    +-----------------+-------------+----------+--------------+ Shoulder             0.28        1.00                  +-----------------+-------------+----------+--------------+ Prox upper arm       0.12        0.56                  +-----------------+-------------+----------+--------------+ Mid upper arm        0.15        0.57                  +-----------------+-------------+----------+--------------+ Dist upper arm       0.09        0.49                  +-----------------+-------------+----------+--------------+ Antecubital fossa                       not visualized +-----------------+-------------+----------+--------------+ Prox forearm                            not visualized +-----------------+-------------+----------+--------------+ Mid forearm                             not visualized +-----------------+-------------+----------+--------------+ Dist forearm                            not visualized +-----------------+-------------+----------+--------------+ Wrist                                   not visualized +-----------------+-------------+----------+--------------+ +-----------------+-------------+----------+--------------+ Right Basilic    Diameter (cm)Depth (cm)   Findings    +-----------------+-------------+----------+--------------+ Shoulder                                not visualized +-----------------+-------------+----------+--------------+ Prox upper arm  not visualized +-----------------+-------------+----------+--------------+ Mid upper arm        0.40                              +-----------------+-------------+----------+--------------+ Dist upper arm       0.34                 branching     +-----------------+-------------+----------+--------------+ Antecubital fossa    0.23                              +-----------------+-------------+----------+--------------+ Prox forearm         0.17                              +-----------------+-------------+----------+--------------+ Mid forearm                             not visualized +-----------------+-------------+----------+--------------+ Distal forearm                          not visualized +-----------------+-------------+----------+--------------+ Wrist                                   not visualized +-----------------+-------------+----------+--------------+ +-----------------+-------------+----------+---------------------------------+ Left Cephalic    Diameter (cm)Depth (cm)            Findings              +-----------------+-------------+----------+---------------------------------+ Shoulder             0.29        0.71                                     +-----------------+-------------+----------+---------------------------------+ Prox upper arm       0.31        0.42                                     +-----------------+-------------+----------+---------------------------------+ Mid upper arm        0.32        0.43                                     +-----------------+-------------+----------+---------------------------------+ Dist upper arm       0.28        0.48                                     +-----------------+-------------+----------+---------------------------------+ Antecubital fossa    0.27        0.38                                     +-----------------+-------------+----------+---------------------------------+ Prox forearm         0.17        0.62                                     +-----------------+-------------+----------+---------------------------------+  Mid forearm                             not visualized and IV at location  +-----------------+-------------+----------+---------------------------------+ Dist forearm         0.12        0.20                                     +-----------------+-------------+----------+---------------------------------+ Wrist                                            not visualized           +-----------------+-------------+----------+---------------------------------+ +-----------------+-------------+----------+--------------+ Left Basilic     Diameter (cm)Depth (cm)   Findings    +-----------------+-------------+----------+--------------+ Prox upper arm       0.34                              +-----------------+-------------+----------+--------------+ Mid upper arm        0.26                              +-----------------+-------------+----------+--------------+ Dist upper arm       0.31                 branching    +-----------------+-------------+----------+--------------+ Antecubital fossa    0.20                              +-----------------+-------------+----------+--------------+ Prox forearm         0.21                              +-----------------+-------------+----------+--------------+ Mid forearm          0.21                              +-----------------+-------------+----------+--------------+ Distal forearm       0.17                              +-----------------+-------------+----------+--------------+ Wrist                                   not visualized +-----------------+-------------+----------+--------------+ *See table(s) above for measurements and observations.  Diagnosing physician: Ruta Hinds MD Electronically signed by Ruta Hinds MD on 07/21/2018 at 5:25:53 PM.    Final     Cardiac Studies      Patient Profile     83 y.o. female with progressive renal failure   Assessment & Plan    1.  Acute on chronic systolic congestive heart failure: She was noted to have mildly reduced left ventricular  systolic function during her echo in August, 2019.    Echocardiogram from yesterday is consistent with previous echo.  Her ejection fraction is mildly decreased with an EF of 50%.  She has grade 1 diastolic dysfunction. There is mild aortic stenosis. She has  moderate pulmonary hypertension with an estimated PA pressure of 62 mmHg.  She has been resistant to diuretics.  Plan is to start dialysis tomorrow. SPECT that she will improve significantly with dialysis.  2.  Anemia: Her work-up per internal medicine team   For questions or updates, please contact [place] Please consult [email or web address] for contact info under        Signed, Mertie Moores, MD  07/22/2018, 10:56 AM

## 2018-07-22 NOTE — Progress Notes (Signed)
Returned pt daughter call back and notified her of pt procedure time as requested.

## 2018-07-22 NOTE — Care Management Important Message (Signed)
Important Message  Patient Details  Name: Mallory Chavez MRN: [id number] Date of Birth: [birth year]   Medicare Important Message Given:  Yes    [name] 07/22/2018, 3:29 PM

## 2018-07-22 NOTE — Consult Note (Addendum)
[facility] NOTE   MRN : [id number]  Reason for Consult: progressive CKD  Referring Physician: Nephrologist  History of Present Illness: 83 y/o female with progressive AKI on CKD.  We have been asked to provide permanent HD access.  The plan is to start HD this admission per Nephrology note from Dr. Posey Pronto.  Past medical history:  hypertension, chronic combined systolic and diastolic CHF, chronic kidney disease stage IV, and COPD.       Current Facility-Administered Medications  Medication Dose Route Frequency Provider Last Rate Last Dose  . 0.9 %  sodium chloride infusion  10 mL/hr Intravenous Once Opyd, Timothy S, MD      . 0.9 %  sodium chloride infusion  250 mL Intravenous PRN Opyd, Mallory Qua, MD      . acetaminophen (TYLENOL) tablet 650 mg  650 mg Oral Q4H PRN Opyd, Mallory Qua, MD      . amLODipine (NORVASC) tablet 10 mg  10 mg Oral QPC supper Opyd, Mallory Qua, MD   10 mg at 07/21/18 1911  . calcium acetate (PHOSLO) capsule 1,334 mg  1,334 mg Oral TID WC Elmarie Shiley, MD   1,334 mg at 07/21/18 1910  . cinacalcet (SENSIPAR) tablet 30 mg  30 mg Oral Q breakfast Opyd, Mallory Qua, MD   30 mg at 07/21/18 0900  . Darbepoetin Alfa (ARANESP) injection 200 mcg  200 mcg Intravenous Q Tue-HD Deterding, Jeneen Rinks, MD      . diltiazem (CARDIZEM CD) 24 hr capsule 360 mg  360 mg Oral Daily Opyd, Mallory Qua, MD   360 mg at 07/21/18 0901  . feeding supplement (BOOST / RESOURCE BREEZE) liquid 1 Container  1 Container Oral TID BM Opyd, Mallory Qua, MD   1 Container at 07/21/18 2214  . furosemide (LASIX) tablet 160 mg  160 mg Oral TID Mauricia Area, MD   160 mg at 07/21/18 2215  . methocarbamol (ROBAXIN) tablet 500 mg  500 mg Oral Q8H PRN Bodenheimer, Charles A, NP   500 mg at 07/20/18 2209  . mometasone-formoterol (DULERA) 200-5 MCG/ACT inhaler 2 puff  2 puff Inhalation BID Opyd, Mallory Qua, MD   2 puff at 07/22/18 0752  . ondansetron (ZOFRAN) injection 4 mg  4 mg Intravenous  Q6H PRN Opyd, Mallory Qua, MD      . pantoprazole (PROTONIX) EC tablet 40 mg  40 mg Oral Daily Opyd, Mallory Qua, MD   40 mg at 07/21/18 0901  . sodium chloride flush (NS) 0.9 % injection 3 mL  3 mL Intravenous Q12H Opyd, Mallory Qua, MD   3 mL at 07/21/18 2216  . sodium chloride flush (NS) 0.9 % injection 3 mL  3 mL Intravenous PRN Opyd, Mallory Qua, MD        Pt meds include: Statin :No Betablocker: No ASA: Yes Other anticoagulants/antiplatelets: none  Past Medical History:  Diagnosis Date  . Acute renal failure (ARF) ([place]) 06/15/2016  . Allergy   . Arthritis   . Asthma   . CHF (congestive heart failure) ([place]) 1980   controlled with meds  . Chronic respiratory failure ([place])   . CKD (chronic kidney disease)   . COPD (chronic obstructive pulmonary disease) ([place])   . GERD (gastroesophageal reflux disease)   . Hyperglycemia   . Hyperlipidemia   . Hypertension   . Hyponatremia     Past Surgical History:  Procedure Laterality Date  . [place]  . TUMOR REMOVAL  lower back/benign and has come back  . VAGINAL HYSTERECTOMY      Social History Social History   Tobacco Use  . Smoking status: Former Smoker    Packs/day: 1.50    Years: 20.00    Pack years: 30.00    Types: Cigarettes    Last attempt to quit: 07/02/1992    Years since quitting: 26.0  . Smokeless tobacco: Never Used  Substance Use Topics  . Alcohol use: No  . Drug use: No    Family History Family History  Problem Relation Age of Onset  . Diabetes Mother   . Emphysema Father     No Active Allergies   REVIEW OF SYSTEMS  General: [ ]  Weight loss, [ ]  Fever, [ ]  chills Neurologic: [ ]  Dizziness, [ ]  Blackouts, [ ]  Seizure [ ]  Stroke, [ ]  "Mini stroke", [ ]  Slurred speech, [ ]  Temporary blindness; [ ]  weakness in arms or legs, [ ]  Hoarseness [ ]  Dysphagia Cardiac: [ ]  Chest pain/pressure, [ ]  Shortness of breath at rest [ ]  Shortness of breath with exertion, [ ]  Atrial fibrillation or  irregular heartbeat  Vascular: [ ]  Pain in legs with walking, [ ]  Pain in legs at rest, [ ]  Pain in legs at night,  [ ]  Non-healing ulcer, [ ]  Blood clot in vein/DVT,   Pulmonary: [ ]  Home oxygen, [ ]  Productive cough, [ ]  Coughing up blood, [ ]  Asthma,  [ ]  Wheezing [x ] COPD Musculoskeletal:  [ ]  Arthritis, [ ]  Low back pain, [ ]  Joint pain Hematologic: [ ]  Easy Bruising, [x ] Anemia; [ ]  Hepatitis Gastrointestinal: [ ]  Blood in stool, [ ]  Gastroesophageal Reflux/heartburn, Urinary: [x ] chronic Kidney disease, [ ]  on HD - [ ]  MWF or [ ]  TTHS, [ ]  Burning with urination, [ ]  Difficulty urinating Skin: [ ]  Rashes, [ ]  Wounds Psychological: [ ]  Anxiety, [ ]  Depression  Physical Examination Vitals:   07/21/18 2139 07/21/18 2213 07/22/18 0740 07/22/18 0752  BP:      Pulse: 82 80    Resp: 16     Temp:  99.1 F (37.3 C)    TempSrc:  Oral    SpO2: 100% 99% 95% 95%  Weight:   76 kg    Body mass index is 29.68 kg/m.  General:  WDWN in NAD HENT: WNL, normocephalic Eyes: Pupils equal Pulmonary: normal non-labored breathing , without Rales, rhonchi,  wheezing Cardiac: RRR, without  Murmurs, rubs or gallops; No carotid bruits Abdomen: soft, NT, no masses Skin: no rashes, ulcers noted;  no Gangrene , no cellulitis; no open wounds;   Vascular Exam/Pulses:palapble radial pulses B UE   Musculoskeletal: no muscle wasting or atrophy; no edema  Neurologic: A&O X 3; Appropriate Affect ;  SENSATION: normal; MOTOR FUNCTION: 5/5 Symmetric Speech is fluent/normal   Significant Diagnostic Studies: CBC Lab Results  Component Value Date   WBC 11.5 (H) 07/22/2018   HGB 9.1 (L) 07/22/2018   HCT 28.5 (L) 07/22/2018   MCV 89.9 07/22/2018   PLT 217 07/22/2018    BMET    Component Value Date/Time   NA 138 07/22/2018 0445   K 4.0 07/22/2018 0445   CL 108 07/22/2018 0445   CO2 19 (L) 07/22/2018 0445   GLUCOSE 107 (H) 07/22/2018 0445   BUN 76 (H) 07/22/2018 0445   CREATININE 5.61 (H)  07/22/2018 0445   CALCIUM 9.9 07/22/2018 0445   GFRNONAA 7 (L) 07/22/2018 0445  GFRAA 8 (L) 07/22/2018 0445   Estimated Creatinine Clearance: 7.8 mL/min (A) (by C-G formula based on SCr of 5.61 mg/dL (H)).  COAG Lab Results  Component Value Date   INR 1.13 07/19/2018   INR 1.05 07/03/2016     Non-Invasive Vascular Imaging:  +-----------------+-------------+----------+--------------+ Right Cephalic   Diameter (cm)Depth (cm)   Findings    +-----------------+-------------+----------+--------------+ Shoulder             0.28        1.00                  +-----------------+-------------+----------+--------------+ Prox upper arm       0.12        0.56                  +-----------------+-------------+----------+--------------+ Mid upper arm        0.15        0.57                  +-----------------+-------------+----------+--------------+ Dist upper arm       0.09        0.49                  +-----------------+-------------+----------+--------------+ Antecubital fossa                       not visualized +-----------------+-------------+----------+--------------+ Prox forearm                            not visualized +-----------------+-------------+----------+--------------+ Mid forearm                             not visualized +-----------------+-------------+----------+--------------+ Dist forearm                            not visualized +-----------------+-------------+----------+--------------+ Wrist                                   not visualized +-----------------+-------------+----------+--------------+  +-----------------+-------------+----------+--------------+ Right Basilic    Diameter (cm)Depth (cm)   Findings    +-----------------+-------------+----------+--------------+ Shoulder                                not visualized +-----------------+-------------+----------+--------------+ Prox upper arm                           not visualized +-----------------+-------------+----------+--------------+ Mid upper arm        0.40                              +-----------------+-------------+----------+--------------+ Dist upper arm       0.34                 branching    +-----------------+-------------+----------+--------------+ Antecubital fossa    0.23                              +-----------------+-------------+----------+--------------+ Prox forearm         0.17                              +-----------------+-------------+----------+--------------+  Mid forearm                             not visualized +-----------------+-------------+----------+--------------+ Distal forearm                          not visualized +-----------------+-------------+----------+--------------+ Wrist                                   not visualized +-----------------+-------------+----------+--------------+  +-----------------+-------------+----------+---------------------------------+ Left Cephalic    Diameter (cm)Depth (cm)            Findings              +-----------------+-------------+----------+---------------------------------+ Shoulder             0.29        0.71                                     +-----------------+-------------+----------+---------------------------------+ Prox upper arm       0.31        0.42                                     +-----------------+-------------+----------+---------------------------------+ Mid upper arm        0.32        0.43                                     +-----------------+-------------+----------+---------------------------------+ Dist upper arm       0.28        0.48                                     +-----------------+-------------+----------+---------------------------------+ Antecubital fossa    0.27        0.38                                      +-----------------+-------------+----------+---------------------------------+ Prox forearm         0.17        0.62                                     +-----------------+-------------+----------+---------------------------------+ Mid forearm                             not visualized and IV at location +-----------------+-------------+----------+---------------------------------+ Dist forearm         0.12        0.20                                     +-----------------+-------------+----------+---------------------------------+ Wrist  not visualized           +-----------------+-------------+----------+---------------------------------+  +-----------------+-------------+----------+--------------+ Left Basilic     Diameter (cm)Depth (cm)   Findings    +-----------------+-------------+----------+--------------+ Prox upper arm       0.34                              +-----------------+-------------+----------+--------------+ Mid upper arm        0.26                              +-----------------+-------------+----------+--------------+ Dist upper arm       0.31                 branching    +-----------------+-------------+----------+--------------+ Antecubital fossa    0.20                              +-----------------+-------------+----------+--------------+ Prox forearm         0.21                              +-----------------+-------------+----------+--------------+ Mid forearm          0.21                              +-----------------+-------------+----------+--------------+ Distal forearm       0.17                              +-----------------+-------------+----------+--------------+ Wrist                                   not visualized +-----------------+-------------+----------+--------------+   ASSESSMENT/PLAN:  AKI on CKI Plan for left UE AV fistula  brachiocephalic fistula verses graft and placement of TDC.  She is right hand dominant.  Will have IV moved from left UE.  Pending scheduling for some time this week.  Patient NPO with consent for surgery tomorrow.   [facility] 07/22/2018 7:59 AM   I agree with the above.  I have seen and examined the patient.  She is a 83 yo right handed female, now with ESRD needing dialysis.  Her vein mapping indicates that she may have an adequate vein for fistula creation.  I spoke with her regarding fistula vs graft, a decision that will be made I the OR.  At the same time we will place a Catheter.  She will be NPO after midnight.  All questions answered.  [facility]

## 2018-07-22 NOTE — Progress Notes (Signed)
Educated pt on injection of new medication Darbepoetin Alpha. Pt stated that this is a new weekly medication to be started for her. She was unable to inject herself and states that she cannot bear to stick herself with a needle. She said that family may be able to assist her if injections are necessary after D/C. She was accepting, and able to teach back about location of injection site.

## 2018-07-23 ENCOUNTER — Inpatient Hospital Stay (HOSPITAL_COMMUNITY): Payer: Medicare Other

## 2018-07-23 ENCOUNTER — Inpatient Hospital Stay (HOSPITAL_COMMUNITY): Payer: Medicare Other | Admitting: Anesthesiology

## 2018-07-23 ENCOUNTER — Telehealth: Payer: Self-pay | Admitting: Vascular Surgery

## 2018-07-23 ENCOUNTER — Encounter (HOSPITAL_COMMUNITY): Payer: Self-pay | Admitting: Anesthesiology

## 2018-07-23 ENCOUNTER — Encounter (HOSPITAL_COMMUNITY)
Admission: EM | Disposition: A | Discharge status: Home Health | Payer: Self-pay | Source: Home / Self Care | Attending: Internal Medicine

## 2018-07-23 DIAGNOSIS — N179 Acute kidney failure, unspecified: Secondary | ICD-10-CM

## 2018-07-23 HISTORY — PX: INSERTION OF DIALYSIS CATHETER: SHX1324

## 2018-07-23 HISTORY — PX: AV FISTULA PLACEMENT: SHX1204

## 2018-07-23 LAB — BPAM RBC
BLOOD PRODUCT EXPIRATION DATE: 202002132359
Blood Product Expiration Date: 202002132359
Blood Product Expiration Date: 202002132359
Blood Product Expiration Date: 202002142359
ISSUE DATE / TIME: 202001182059
ISSUE DATE / TIME: 202001190121
ISSUE DATE / TIME: 202001190537
Unit Type and Rh: 7300
Unit Type and Rh: 7300
Unit Type and Rh: 7300
Unit Type and Rh: 7300

## 2018-07-23 LAB — CBC
HCT: 29.3 % — ABNORMAL LOW (ref 36.0–46.0)
Hemoglobin: 9.6 g/dL — ABNORMAL LOW (ref 12.0–15.0)
MCH: 29 pg (ref 26.0–34.0)
MCHC: 32.8 g/dL (ref 30.0–36.0)
MCV: 88.5 fL (ref 80.0–100.0)
NRBC: 0 % (ref 0.0–0.2)
Platelets: 253 10*3/uL (ref 150–400)
RBC: 3.31 MIL/uL — AB (ref 3.87–5.11)
RDW: 16.2 % — ABNORMAL HIGH (ref 11.5–15.5)
WBC: 12.3 10*3/uL — ABNORMAL HIGH (ref 4.0–10.5)

## 2018-07-23 LAB — TYPE AND SCREEN
ABO/RH(D): B POS
Antibody Screen: NEGATIVE
Unit division: 0
Unit division: 0
Unit division: 0
Unit division: 0

## 2018-07-23 LAB — RENAL FUNCTION PANEL
ANION GAP: 14 (ref 5–15)
Albumin: 3.4 g/dL — ABNORMAL LOW (ref 3.5–5.0)
BUN: 85 mg/dL — ABNORMAL HIGH (ref 8–23)
CO2: 19 mmol/L — ABNORMAL LOW (ref 22–32)
Calcium: 10.3 mg/dL (ref 8.9–10.3)
Chloride: 104 mmol/L (ref 98–111)
Creatinine, Ser: 5.7 mg/dL — ABNORMAL HIGH (ref 0.44–1.00)
GFR calc Af Amer: 8 mL/min — ABNORMAL LOW (ref >60)
GFR calc non Af Amer: 6 mL/min — ABNORMAL LOW (ref >60)
Glucose, Bld: 104 mg/dL — ABNORMAL HIGH (ref 70–99)
POTASSIUM: 3.7 mmol/L (ref 3.5–5.1)
Phosphorus: 6.3 mg/dL — ABNORMAL HIGH (ref 2.5–4.6)
Sodium: 137 mmol/L (ref 135–145)

## 2018-07-23 LAB — MAGNESIUM: Magnesium: 1.8 mg/dL (ref 1.7–2.4)

## 2018-07-23 LAB — PROTIME-INR
INR: 1.07
Prothrombin Time: 13.8 seconds (ref 11.4–15.2)

## 2018-07-23 SURGERY — ARTERIOVENOUS (AV) FISTULA CREATION
Anesthesia: Monitor Anesthesia Care | Site: Neck | Laterality: Right

## 2018-07-23 MED ORDER — HEPARIN SODIUM (PORCINE) 1000 UNIT/ML IJ SOLN
INTRAMUSCULAR | Status: DC | PRN
  Administered 2018-07-23: 1000 [IU] via INTRAVENOUS

## 2018-07-23 MED ORDER — ACETAMINOPHEN 325 MG PO TABS: 325.0000 mg | ORAL_TABLET | Freq: Once | ORAL | Status: DC

## 2018-07-23 MED ORDER — LIDOCAINE-EPINEPHRINE 0.5 %-1:200000 IJ SOLN
INTRAMUSCULAR | Status: DC | PRN
  Administered 2018-07-23: 30 mL

## 2018-07-23 MED ORDER — FENTANYL CITRATE (PF) 250 MCG/5ML IJ SOLN
INTRAMUSCULAR | Status: AC
  Filled 2018-07-23: qty 5

## 2018-07-23 MED ORDER — 0.9 % SODIUM CHLORIDE (POUR BTL) OPTIME
TOPICAL | Status: DC | PRN
  Administered 2018-07-23: 1000 mL

## 2018-07-23 MED ORDER — HEPARIN SODIUM (PORCINE) 1000 UNIT/ML IJ SOLN
INTRAMUSCULAR | Status: AC
  Filled 2018-07-23: qty 1

## 2018-07-23 MED ORDER — PROPOFOL 10 MG/ML IV BOLUS
INTRAVENOUS | Status: DC | PRN
  Administered 2018-07-23: 10 mg via INTRAVENOUS

## 2018-07-23 MED ORDER — ACETAMINOPHEN 160 MG/5ML PO SOLN: 325.0000 mg | Freq: Once | ORAL | Status: DC

## 2018-07-23 MED ORDER — SODIUM CHLORIDE 0.9 % IV SOLN
INTRAVENOUS | Status: AC
  Filled 2018-07-23: qty 1.2

## 2018-07-23 MED ORDER — HEPARIN SODIUM (PORCINE) 1000 UNIT/ML DIALYSIS
1000.0000 [IU] | INTRAMUSCULAR | Status: DC | PRN
  Filled 2018-07-23: qty 1

## 2018-07-23 MED ORDER — LIDOCAINE-PRILOCAINE 2.5-2.5 % EX CREA
1.0000 "application " | TOPICAL_CREAM | CUTANEOUS | Status: DC | PRN
  Filled 2018-07-23: qty 5

## 2018-07-23 MED ORDER — ALTEPLASE 2 MG IJ SOLR
2.0000 mg | Freq: Once | INTRAMUSCULAR | Status: DC | PRN
  Filled 2018-07-23: qty 2

## 2018-07-23 MED ORDER — PROPOFOL 10 MG/ML IV BOLUS
INTRAVENOUS | Status: AC
  Filled 2018-07-23: qty 20

## 2018-07-23 MED ORDER — SODIUM CHLORIDE 0.9 % IV SOLN: 100.0000 mL | INTRAVENOUS | Status: DC | PRN

## 2018-07-23 MED ORDER — PENTAFLUOROPROP-TETRAFLUOROETH EX AERO: 1.0000 "application " | INHALATION_SPRAY | CUTANEOUS | Status: DC | PRN

## 2018-07-23 MED ORDER — SODIUM CHLORIDE 0.9 % IV SOLN
INTRAVENOUS | Status: DC | PRN
  Administered 2018-07-23: 11:00:00

## 2018-07-23 MED ORDER — MEPERIDINE HCL 50 MG/ML IJ SOLN: 6.2500 mg | INTRAMUSCULAR | Status: DC | PRN

## 2018-07-23 MED ORDER — DIPHENHYDRAMINE HCL 50 MG/ML IJ SOLN
INTRAMUSCULAR | Status: DC | PRN
  Administered 2018-07-23: 12.5 mg via INTRAVENOUS

## 2018-07-23 MED ORDER — LIDOCAINE 2% (20 MG/ML) 5 ML SYRINGE
INTRAMUSCULAR | Status: DC | PRN
  Administered 2018-07-23: 40 mg via INTRAVENOUS

## 2018-07-23 MED ORDER — ACETAMINOPHEN 10 MG/ML IV SOLN: 1000.0000 mg | Freq: Once | INTRAVENOUS | Status: DC | PRN

## 2018-07-23 MED ORDER — PROMETHAZINE HCL 25 MG/ML IJ SOLN: 6.2500 mg | INTRAMUSCULAR | Status: DC | PRN

## 2018-07-23 MED ORDER — LIDOCAINE HCL (PF) 1 % IJ SOLN
5.0000 mL | INTRAMUSCULAR | Status: DC | PRN
  Filled 2018-07-23: qty 5

## 2018-07-23 MED ORDER — FENTANYL CITRATE (PF) 100 MCG/2ML IJ SOLN: 25.0000 ug | INTRAMUSCULAR | Status: DC | PRN

## 2018-07-23 MED ORDER — HEPARIN SODIUM (PORCINE) 1000 UNIT/ML DIALYSIS
40.0000 [IU]/kg | INTRAMUSCULAR | Status: DC | PRN
  Filled 2018-07-23: qty 3

## 2018-07-23 MED ORDER — LIDOCAINE HCL (PF) 1 % IJ SOLN
INTRAMUSCULAR | Status: AC
  Filled 2018-07-23: qty 30

## 2018-07-23 MED ORDER — LACTATED RINGERS IV SOLN: INTRAVENOUS | Status: DC

## 2018-07-23 MED ORDER — LIDOCAINE-EPINEPHRINE (PF) 1 %-1:200000 IJ SOLN
INTRAMUSCULAR | Status: AC
  Filled 2018-07-23: qty 30

## 2018-07-23 MED ORDER — FENTANYL CITRATE (PF) 250 MCG/5ML IJ SOLN
INTRAMUSCULAR | Status: DC | PRN
  Administered 2018-07-23: 50 ug via INTRAVENOUS
  Administered 2018-07-23 (×2): 25 ug via INTRAVENOUS
  Administered 2018-07-23: 50 ug via INTRAVENOUS

## 2018-07-23 MED ORDER — CHLORHEXIDINE GLUCONATE CLOTH 2 % EX PADS
6.0000 | MEDICATED_PAD | Freq: Every day | CUTANEOUS | Status: DC
  Administered 2018-07-23 – 2018-07-28 (×6): 6 via TOPICAL

## 2018-07-23 MED ORDER — HYDROCODONE-ACETAMINOPHEN 5-325 MG PO TABS
1.0000 | ORAL_TABLET | ORAL | Status: DC | PRN
  Administered 2018-07-23 – 2018-07-25 (×3): 2 via ORAL
  Filled 2018-07-23 (×3): qty 2

## 2018-07-23 MED ORDER — PROPOFOL 1000 MG/100ML IV EMUL
INTRAVENOUS | Status: AC
  Filled 2018-07-23: qty 100

## 2018-07-23 MED ORDER — PROPOFOL 500 MG/50ML IV EMUL
INTRAVENOUS | Status: DC | PRN
  Administered 2018-07-23: 50 ug/kg/min via INTRAVENOUS

## 2018-07-23 MED ORDER — HYDRALAZINE HCL 20 MG/ML IJ SOLN: 10.0000 mg | Freq: Four times a day (QID) | INTRAMUSCULAR | Status: DC | PRN

## 2018-07-23 SURGICAL SUPPLY — 54 items
ADH SKN CLS APL DERMABOND .7 (GAUZE/BANDAGES/DRESSINGS) ×4
ARMBAND PINK RESTRICT EXTREMIT (MISCELLANEOUS) ×5 IMPLANT
BAG DECANTER FOR FLEXI CONT (MISCELLANEOUS) ×3 IMPLANT
BIOPATCH RED 1 DISK 7.0 (GAUZE/BANDAGES/DRESSINGS) ×3 IMPLANT
CANISTER SUCT 3000ML PPV (MISCELLANEOUS) ×3 IMPLANT
CANNULA VESSEL 3MM 2 BLNT TIP (CANNULA) ×3 IMPLANT
CATH PALINDROME RT-P 15FX19CM (CATHETERS) ×1 IMPLANT
CATH PALINDROME RT-P 15FX23CM (CATHETERS) IMPLANT
CATH PALINDROME RT-P 15FX28CM (CATHETERS) IMPLANT
CATH PALINDROME RT-P 15FX55CM (CATHETERS) IMPLANT
CLIP LIGATING EXTRA MED SLVR (CLIP) ×3 IMPLANT
CLIP LIGATING EXTRA SM BLUE (MISCELLANEOUS) ×3 IMPLANT
COVER PROBE W GEL 5X96 (DRAPES) ×3 IMPLANT
COVER SURGICAL LIGHT HANDLE (MISCELLANEOUS) ×3 IMPLANT
COVER WAND RF STERILE (DRAPES) ×3 IMPLANT
DECANTER SPIKE VIAL GLASS SM (MISCELLANEOUS) ×3 IMPLANT
DERMABOND ADVANCED (GAUZE/BANDAGES/DRESSINGS) ×2
DERMABOND ADVANCED .7 DNX12 (GAUZE/BANDAGES/DRESSINGS) ×2 IMPLANT
DRAPE C-ARM 42X72 X-RAY (DRAPES) ×3 IMPLANT
DRAPE CHEST BREAST 15X10 FENES (DRAPES) ×3 IMPLANT
ELECT REM PT RETURN 9FT ADLT (ELECTROSURGICAL) ×3
ELECTRODE REM PT RTRN 9FT ADLT (ELECTROSURGICAL) ×2 IMPLANT
GAUZE 4X4 16PLY RFD (DISPOSABLE) ×3 IMPLANT
GLOVE BIOGEL PI IND STRL 6.5 (GLOVE) IMPLANT
GLOVE BIOGEL PI INDICATOR 6.5 (GLOVE) ×3
GLOVE SS BIOGEL STRL SZ 7.5 (GLOVE) ×2 IMPLANT
GLOVE SUPERSENSE BIOGEL SZ 7.5 (GLOVE) ×1
GOWN STRL REUS W/ TWL LRG LVL3 (GOWN DISPOSABLE) ×6 IMPLANT
GOWN STRL REUS W/TWL LRG LVL3 (GOWN DISPOSABLE) ×9
KIT BASIN OR (CUSTOM PROCEDURE TRAY) ×3 IMPLANT
KIT TURNOVER KIT B (KITS) ×3 IMPLANT
NDL 18GX1X1/2 (RX/OR ONLY) (NEEDLE) ×2 IMPLANT
NDL HYPO 25GX1X1/2 BEV (NEEDLE) ×2 IMPLANT
NEEDLE 18GX1X1/2 (RX/OR ONLY) (NEEDLE) ×3 IMPLANT
NEEDLE 22X1 1/2 (OR ONLY) (NEEDLE) IMPLANT
NEEDLE HYPO 25GX1X1/2 BEV (NEEDLE) ×3 IMPLANT
NS IRRIG 1000ML POUR BTL (IV SOLUTION) ×3 IMPLANT
PACK CV ACCESS (CUSTOM PROCEDURE TRAY) ×3 IMPLANT
PACK SURGICAL SETUP 50X90 (CUSTOM PROCEDURE TRAY) ×3 IMPLANT
PAD ARMBOARD 7.5X6 YLW CONV (MISCELLANEOUS) ×6 IMPLANT
SOAP 2 % CHG 4 OZ (WOUND CARE) ×2 IMPLANT
SUT ETHILON 3 0 PS 1 (SUTURE) ×4 IMPLANT
SUT PROLENE 6 0 CC (SUTURE) ×4 IMPLANT
SUT VIC AB 3-0 SH 27 (SUTURE) ×6
SUT VIC AB 3-0 SH 27X BRD (SUTURE) ×2 IMPLANT
SUT VICRYL 4-0 PS2 18IN ABS (SUTURE) ×3 IMPLANT
SYR 10ML LL (SYRINGE) ×3 IMPLANT
SYR 20CC LL (SYRINGE) ×3 IMPLANT
SYR 5ML LL (SYRINGE) ×6 IMPLANT
SYR CONTROL 10ML LL (SYRINGE) ×3 IMPLANT
TOWEL GREEN STERILE (TOWEL DISPOSABLE) ×6 IMPLANT
TOWEL GREEN STERILE FF (TOWEL DISPOSABLE) ×3 IMPLANT
UNDERPAD 30X30 (UNDERPADS AND DIAPERS) ×3 IMPLANT
WATER STERILE IRR 1000ML POUR (IV SOLUTION) ×3 IMPLANT

## 2018-07-23 NOTE — Telephone Encounter (Signed)
-----   Message from Dagoberto Ligas, PA-C sent at 07/23/2018 11:59 AM EST -----  Can you schedule an appt for this pt in 4-6 weeks with fistula duplex for Dr. Donzetta Matters.  PO L brachiocephalic. Thanks, [facility]

## 2018-07-23 NOTE — Op Note (Signed)
    Patient name: [facility] MRN: [id number] DOB: Apr 24, 1936 Sex: female  07/23/2018 Pre-operative Diagnosis: End-stage renal disease Post-operative diagnosis:  Same Surgeon:  Erlene Quan C. Donzetta Matters, MD Assistant: Arlee Muslim, PA Procedure Performed: 1.  Placement of right IJ 19 cm tunneled dialysis catheter with ultrasound guidance 2.  Left arm brachiocephalic AV fistula creation  Indications: 83 year old female with end-stage renal disease now indicated for permanent access as well as tunneled dialysis catheter.  Findings: Right IJ was patent cannulated with ultrasound guidance and 19 cm catheter placed to the atrial innominate junction.  The cephalic vein in the upper arm was greater than 3 mm below the antecubitum easily dilated to 3.5 mm.  At completion there is good thrill in the vein palpable radial pulse the wrist.   Procedure:  The patient was identified in the holding area and taken to the operating room where she is placed to bilateral pain table and MAC anesthesia induced.  She was sterilely prepped and draped in the bilateral neck and left upper extremity in the usual fashion.  Antibiotics were administered and a timeout was called.  We began using ultrasound to identify the right internal jugular vein.  The area was anesthetized 1% lidocaine with epinephrine.  The IJ was cannulate with direct ultrasound visualization with 18-gauge needle and a wire was passed centrally.  A counterincision was made and a 19 cm catheter was tunneled.  The wire tract was serially dilated and the catheter was introduced to the atrial innominate junction and the catheter was then assembled.  Was flushed with heparinized saline affixed the skin with 3-0 nylon suture and the neck incision was closed with 4-0 Monocryl.  Dermabond was placed at both sites and a sterile dressing was placed.  Catheter was then locked with concentrated heparin 1.5 cc in either port.  Attention was then turned to the left upper  extremity.  Ultrasound was used to identify the cephalic vein which was noted be approximately 3 mm below the antecubital.  The artery was readily palpable just below that.  The area was anesthetized 1% lidocaine with epinephrine.  After analgesia was obtained transverse incision was made.  We dissected out the vein margin for orientation.  I then dissected deeper through the fascia identified the artery which was noted to be free of disease and a vessel loop was placed around this.  The vein was then transected distally and tied off.  It easily dilated to 3.5 mm was flushed heparinized saline and clamped.  The artery was clamped distally proximally opened longitudinally flushed heparinized saline both directions.  Vein was then sewn end-to-side with 6-0 Prolene suture.  Prior to the ablation anastomosis we allowed flushing all directions.  Upon completion there was actually really good thrill in the runoff vein and a palpable radial pulse the wrist both confirmed with Doppler.  Satisfied with this we obtained hemostasis in the wound and closed in layers with Vicryl Monocryl.  She was then allowed awaken anesthesia having tolerated procedure without immediate complication.  All counts were correct at completion.  EBL: 20 cc.   Tayshun Gappa C. Donzetta Matters, MD [facility] [place] Office: [phone] Pager: [phone]

## 2018-07-23 NOTE — Progress Notes (Addendum)
PROGRESS NOTE        PATIENT DETAILS Name: Mallory Chavez Age: 83 y.o. Sex: female Date of Birth: [birth year] Admit Date: 07/19/2018 Admitting Physician Vianne Bulls, MD VHQ:IONGE, Annie Main, MD  Brief Narrative: Patient is a 83 y.o. female with history of chronic diastolic and systolic heart failure, CKD stage IV, with shortness of breath-was found to have acute hypoxic respiratory failure in the setting of decompensated heart failure with worsening renal function.  Point-of-care, diuretics-followed closely by cardiology/nephrology-now thought to have ESRD.  See below for further details.  Subjective: No chest pain or shortness of breath-feels much better.  Assessment/Plan: Acute hypoxic respiratory failure: Secondary to decompensated heart failure, respiratory failure has improved-she initially required BiPAP when she first presented to the hospital.  Currently on 2 L of oxygen via nasal cannula-we will ask RN to slowly titrate off oxygen.  Acute on chronic combined systolic and diastolic heart failure: Volume status has somewhat improved-Per nephrology-she is now ESRD- as of volume issues seem to be refractory to diuretic therapy.  Plans are to start HD once access is placed.  Acute kidney injury on CKD stage IV-now with ESRD: Nephrology following-with plans to start HD once access is placed.  Vascular surgery planning on placing dialysis catheter and left arm aVF versus graft later today.  Anemia: Hb 4.3 on admission-patient denies any overt GI bleeding as she did not have any history of melena or hematochezia.  She was transfused a total of 3 units of PRBC with significant improvement in her hemoglobin.  Prior MD (Dr Starla Link) discussed with GI MD (Dr. Edison Nasuti) on 1/19-given tenuous respiratory/renal status-GI deferred any further endoscopic evaluation at this time.  Since there is no overt bleeding-hemoglobin is now stable over the past few days-suspect this can  be pursued in the outpatient setting.  She could have had occult/unnoticed GI bleeding with anemia of CKD contributing.  Nephrology has started darbepoetin injections as well.  Hypertension: Continue amlodipine-stop Cardizem-if blood pressure becomes an issue will start a alternate antihypertensive agent.  COPD: Stable without any signs of exacerbation-continue bronchodilators  GERD: Continue PPI  Deconditioning/debility: Obtain PT evaluation.  DVT Prophylaxis: Prophylactic Heparin   Code Status:  DNR  Family Communication: None at bedside  Disposition Plan: Remain inpatient  Antimicrobial agents: Anti-infectives (From admission, onward)   Start     Dose/Rate Route Frequency Ordered Stop   07/23/18 0600  [MAR Hold]  cefUROXime (ZINACEF) 1.5 g in sodium chloride 0.9 % 100 mL IVPB     (MAR Hold since Wed 07/23/2018 at 0947. Reason: Transfer to a Procedural area.)   1.5 g 200 mL/hr over 30 Minutes Intravenous On call to O.R. 07/22/18 9528 07/24/18 0559      Procedures: None  CONSULTS:  cardiology, GI and vascular surgery  Time spent: 25- minutes-Greater than 50% of this time was spent in counseling, explanation of diagnosis, planning of further management, and coordination of care.  MEDICATIONS: Scheduled Meds: . [MAR Hold] amLODipine  10 mg Oral QPC supper  . [MAR Hold] calcium acetate  1,334 mg Oral TID WC  . [MAR Hold] cinacalcet  30 mg Oral Q breakfast  . [MAR Hold] darbepoetin (ARANESP) injection - NON-DIALYSIS  200 mcg Subcutaneous Q Tue-1800  . [MAR Hold] diltiazem  360 mg Oral Daily  . [MAR Hold] feeding supplement  1 Container Oral TID BM  . [  MAR Hold] furosemide  160 mg Oral TID  . [MAR Hold] heparin injection (subcutaneous)  5,000 Units Subcutaneous Q8H  . [MAR Hold] mometasone-formoterol  2 puff Inhalation BID  . [MAR Hold] pantoprazole  40 mg Oral Daily  . [MAR Hold] sodium chloride flush  3 mL Intravenous Q12H   Continuous Infusions: . [MAR Hold]  sodium chloride    . [MAR Hold] sodium chloride    . [MAR Hold] cefUROXime (ZINACEF)  IV     PRN Meds:.[MAR Hold] sodium chloride, [MAR Hold] acetaminophen, [MAR Hold] methocarbamol, [MAR Hold] ondansetron (ZOFRAN) IV, [MAR Hold] sodium chloride flush   PHYSICAL EXAM: Vital signs: Vitals:   07/22/18 2044 07/22/18 2215 07/23/18 0508 07/23/18 0850  BP:  (!) 143/72 (!) 144/62   Pulse:  88 77   Resp:  16 16   Temp:  98.2 F (36.8 C) 98.8 F (37.1 C)   TempSrc:  Oral Oral   SpO2: 98% 100% 100% 98%  Weight:   75.9 kg    Filed Weights   07/21/18 0651 07/22/18 0740 07/23/18 0508  Weight: 78.7 kg 76 kg 75.9 kg   Body mass index is 29.64 kg/m.   General appearance :Awake, alert, not in any distress.  Eyes: pupils equally reactive to light and accomodation,no scleral icterus. HEENT: Atraumatic and Normocephalic Neck: supple Resp:Good air entry bilaterally, no added sounds  CVS: S1 S2 regular GI: Bowel sounds present, Non tender and not distended with no gaurding, rigidity or rebound.No organomegaly Extremities: B/L Lower Ext shows no edema, both legs are warm to touch Neurology:  speech clear,Non focal, sensation is grossly intact. Psychiatric: Normal judgment and insight. Alert and oriented x 3. Normal mood. Musculoskeletal:No digital cyanosis Skin:No Rash, warm and dry Wounds:N/A  I have personally reviewed following labs and imaging studies  LABORATORY DATA: CBC: Recent Labs  Lab 07/19/18 1744 07/19/18 1858 07/20/18 1103 07/21/18 0659 07/22/18 0445 07/23/18 0416  WBC 9.2 8.8  --  10.3 11.5* 12.3*  NEUTROABS  --   --   --  7.7 8.4*  --   HGB 4.4* 4.3* 8.3* 8.6* 9.1* 9.6*  HCT 14.3* 14.5* 25.0* 25.2* 28.5* 29.3*  MCV 92.3 98.0  --  88.7 89.9 88.5  PLT 218 210  --  205 217 614    Basic Metabolic Panel: Recent Labs  Lab 07/19/18 1744 07/20/18 1103 07/21/18 0659 07/22/18 0445 07/23/18 0416  NA 135 136 139 138 137  K 4.7 4.3 4.2 4.0 3.7  CL 108 108 108 108  104  CO2 15* 16* 18* 19* 19*  GLUCOSE 145* 108* 101* 107* 104*  BUN 85* 79* 76* 76* 85*  CREATININE 5.21* 5.30* 5.49* 5.61* 5.70*  CALCIUM 9.4 9.7 9.5 9.9 10.3  MG  --   --  2.0 1.9 1.8  PHOS  --   --  6.7* 6.3* 6.3*    GFR: Estimated Creatinine Clearance: 7.7 mL/min (A) (by C-G formula based on SCr of 5.7 mg/dL (H)).  Liver Function Tests: Recent Labs  Lab 07/19/18 1744 07/21/18 0659 07/22/18 0445 07/23/18 0416  AST 14* 14*  --   --   ALT 12 11  --   --   ALKPHOS 57 62  --   --   BILITOT 0.4 0.6  --   --   PROT 6.2* 5.8*  --   --   ALBUMIN 3.7 3.5 3.4* 3.4*   No results for input(s): LIPASE, AMYLASE in the last 168 hours. No results for input(s): AMMONIA  in the last 168 hours.  Coagulation Profile: Recent Labs  Lab 07/19/18 1744 07/23/18 0416  INR 1.13 1.07    Cardiac Enzymes: No results for input(s): CKTOTAL, CKMB, CKMBINDEX, TROPONINI in the last 168 hours.  BNP (last 3 results) No results for input(s): PROBNP in the last 8760 hours.  HbA1C: No results for input(s): HGBA1C in the last 72 hours.  CBG: No results for input(s): GLUCAP in the last 168 hours.  Lipid Profile: No results for input(s): CHOL, HDL, LDLCALC, TRIG, CHOLHDL, LDLDIRECT in the last 72 hours.  Thyroid Function Tests: No results for input(s): TSH, T4TOTAL, FREET4, T3FREE, THYROIDAB in the last 72 hours.  Anemia Panel: Recent Labs    07/20/18 1103 07/20/18 1219  VITAMINB12 869  --   FOLATE 17.3  --   FERRITIN 35  --   TIBC 332 323  IRON 169 193*  RETICCTPCT 3.5*  --     Urine analysis:    Component Value Date/Time   COLORURINE YELLOW 07/02/2016 [place] 07/02/2016 1058   LABSPEC 1.010 07/02/2016 1058   PHURINE 6.0 07/02/2016 1058   GLUCOSEU NEGATIVE 07/02/2016 1058   HGBUR NEGATIVE 07/02/2016 1058   BILIRUBINUR NEGATIVE 07/02/2016 1058   [place] 07/02/2016 1058   PROTEINUR 30 (A) 07/02/2016 1058   NITRITE NEGATIVE 07/02/2016 1058    LEUKOCYTESUR SMALL (A) 07/02/2016 1058    Sepsis Labs: Lactic Acid, Venous    Component Value Date/Time   LATICACIDVEN 1.14 07/19/2018 1806    MICROBIOLOGY: Recent Results (from the past 240 hour(s))  MRSA PCR Screening     Status: None   Collection Time: 07/19/18 10:08 PM  Result Value Ref Range Status   MRSA by PCR NEGATIVE NEGATIVE Final    Comment:        The GeneXpert MRSA Assay (FDA approved for NASAL specimens only), is one component of a comprehensive MRSA colonization surveillance program. It is not intended to diagnose MRSA infection nor to guide or monitor treatment for MRSA infections. Performed at [facility], [place] [address], [place], [place] [address]     RADIOLOGY STUDIES/RESULTS: Dg Chest Port 1 View  Result Date: 07/19/2018 CLINICAL DATA:  Shortness of breath for 3 days EXAM: PORTABLE CHEST 1 VIEW COMPARISON:  02/06/2018 FINDINGS: Cardiac shadow is enlarged but stable. Lungs are well aerated bilaterally. No focal infiltrate or sizable effusion is seen. No acute bony abnormality is noted. IMPRESSION: Stable cardiomegaly.  No acute abnormality noted. Electronically Signed   By: Inez Catalina M.D.   On: 07/19/2018 18:26   Vas [place] Upper Ext Vein Mapping (pre-op Avf)  Result Date: 07/21/2018 UPPER EXTREMITY VEIN MAPPING  Indications: Pre-access. Performing Technologist: Maudry Mayhew MHA, RDMS, RVT, RDCS  Examination Guidelines: A complete evaluation includes B-mode imaging, spectral Doppler, color Doppler, and power Doppler as needed of all accessible portions of each vessel. Bilateral testing is considered an integral part of a complete examination. Limited examinations for reoccurring indications may be performed as noted. +-----------------+-------------+----------+--------------+ Right Cephalic   Diameter (cm)Depth (cm)   Findings    +-----------------+-------------+----------+--------------+ Shoulder             0.28        1.00                   +-----------------+-------------+----------+--------------+ Prox upper arm       0.12        0.56                  +-----------------+-------------+----------+--------------+  Mid upper arm        0.15        0.57                  +-----------------+-------------+----------+--------------+ Dist upper arm       0.09        0.49                  +-----------------+-------------+----------+--------------+ Antecubital fossa                       not visualized +-----------------+-------------+----------+--------------+ Prox forearm                            not visualized +-----------------+-------------+----------+--------------+ Mid forearm                             not visualized +-----------------+-------------+----------+--------------+ Dist forearm                            not visualized +-----------------+-------------+----------+--------------+ Wrist                                   not visualized +-----------------+-------------+----------+--------------+ +-----------------+-------------+----------+--------------+ Right Basilic    Diameter (cm)Depth (cm)   Findings    +-----------------+-------------+----------+--------------+ Shoulder                                not visualized +-----------------+-------------+----------+--------------+ Prox upper arm                          not visualized +-----------------+-------------+----------+--------------+ Mid upper arm        0.40                              +-----------------+-------------+----------+--------------+ Dist upper arm       0.34                 branching    +-----------------+-------------+----------+--------------+ Antecubital fossa    0.23                              +-----------------+-------------+----------+--------------+ Prox forearm         0.17                              +-----------------+-------------+----------+--------------+ Mid forearm                              not visualized +-----------------+-------------+----------+--------------+ Distal forearm                          not visualized +-----------------+-------------+----------+--------------+ Wrist                                   not visualized +-----------------+-------------+----------+--------------+ +-----------------+-------------+----------+---------------------------------+ Left Cephalic    Diameter (cm)Depth (cm)            Findings              +-----------------+-------------+----------+---------------------------------+  Shoulder             0.29        0.71                                     +-----------------+-------------+----------+---------------------------------+ Prox upper arm       0.31        0.42                                     +-----------------+-------------+----------+---------------------------------+ Mid upper arm        0.32        0.43                                     +-----------------+-------------+----------+---------------------------------+ Dist upper arm       0.28        0.48                                     +-----------------+-------------+----------+---------------------------------+ Antecubital fossa    0.27        0.38                                     +-----------------+-------------+----------+---------------------------------+ Prox forearm         0.17        0.62                                     +-----------------+-------------+----------+---------------------------------+ Mid forearm                             not visualized and IV at location +-----------------+-------------+----------+---------------------------------+ Dist forearm         0.12        0.20                                     +-----------------+-------------+----------+---------------------------------+ Wrist                                            not visualized            +-----------------+-------------+----------+---------------------------------+ +-----------------+-------------+----------+--------------+ Left Basilic     Diameter (cm)Depth (cm)   Findings    +-----------------+-------------+----------+--------------+ Prox upper arm       0.34                              +-----------------+-------------+----------+--------------+ Mid upper arm        0.26                              +-----------------+-------------+----------+--------------+ Dist upper arm       0.31  branching    +-----------------+-------------+----------+--------------+ Antecubital fossa    0.20                              +-----------------+-------------+----------+--------------+ Prox forearm         0.21                              +-----------------+-------------+----------+--------------+ Mid forearm          0.21                              +-----------------+-------------+----------+--------------+ Distal forearm       0.17                              +-----------------+-------------+----------+--------------+ Wrist                                   not visualized +-----------------+-------------+----------+--------------+ *See table(s) above for measurements and observations.  Diagnosing physician: Ruta Hinds MD Electronically signed by Ruta Hinds MD on 07/21/2018 at 5:25:53 PM.    Final      LOS: 4 days   Oren Binet, MD  [facility]  If 7PM-7AM, please contact night-coverage  Please page via [email or web address]-Password [email or web address]-click on MD name and type text message  07/23/2018, 10:47 AM

## 2018-07-23 NOTE — Anesthesia Procedure Notes (Signed)
Procedure Name: MAC Date/Time: 07/23/2018 10:52 AM Performed by: Mariea Clonts, CRNA Pre-anesthesia Checklist: Patient identified, Emergency Drugs available, Suction available, Patient being monitored and Timeout performed Patient Re-evaluated:Patient Re-evaluated prior to induction Oxygen Delivery Method: Nasal cannula

## 2018-07-23 NOTE — Progress Notes (Signed)
Patient ID: Mallory Chavez, female   DOB: 19-May-1936, 83 y.o.   MRN: [id number]  [facility] Progress Note   Assessment/ Plan:   1.  Progressive chronic kidney disease now end-stage renal disease: With progressive development of uremic symptoms and volume overload in the setting of advancing chronic kidney disease for which we will begin dialysis today via right IJ [facility] and concomitant process for outpatient dialysis unit placement.  She has a left BCF created today. 2.  Hypertension: Blood pressures slightly elevated, monitor with ongoing diuretics and thereafter HD/UF. 3.  Anemia of chronic kidney disease: Iron saturation septuple range but low ferritin, continue ESA to improve hemoglobin/hematocrit status post PRBC.   4.  Secondary hyperparathyroidism: On cinacalcet for PTH suppression, continue calcium acetate for phosphorus binding. 5.  Volume overload/CHF exacerbation: Discontinue furosemide today and begin hemodialysis with ultrafiltration.  Subjective:   Reports to be tired today from "busy morning getting surgery", denies chest pain or shortness of breath.   Objective:   BP 138/62 (BP Location: Right Arm)   Pulse 77   Temp 98 F (36.7 C) (Oral)   Resp 18   Wt 75.9 kg   SpO2 98%   BMI 29.64 kg/m   Intake/Output Summary (Last 24 hours) at 07/23/2018 1450 Last data filed at 07/23/2018 1200 Gross per 24 hour  Intake 250 ml  Output 875 ml  Net -625 ml   Weight change:   Physical Exam: Gen: Comfortably resting in bed family at bedside CVS: Pulse regular rhythm, normal S1 and S2 without gallop Resp: Diminished breath sounds over bases, no distinct rales.  Right IJ TDC Abd: Soft, obese, nontender Ext: 1-2+ lower extremity edema.  Left BCF with palpable thrill  Imaging: Dg Chest Port 1 View  Result Date: 07/23/2018 CLINICAL DATA:  Status post dialysis catheter insertion. EXAM: PORTABLE CHEST 1 VIEW COMPARISON:  07/19/2018 FINDINGS: Right chest wall dialysis  catheter is noted with tip at the cavoatrial junction. No pneumothorax identified. Mild cardiac enlargement and aortic atherosclerosis. Scarring is identified within both lower lobes. No pleural effusion or edema identified. IMPRESSION: 1. No complications identified status post right chest wall dialysis catheter placement. 2. Cardiac enlargement 3. Bilateral lower lobe scarring. Electronically Signed   By: Kerby Moors M.D.   On: 07/23/2018 12:49   Dg Fluoro Guide Cv Line-no Report  Result Date: 07/23/2018 Fluoroscopy was utilized by the requesting physician.  No radiographic interpretation.    Labs: BMET Recent Labs  Lab 07/19/18 1744 07/20/18 1103 07/21/18 0659 07/22/18 0445 07/23/18 0416  NA 135 136 139 138 137  K 4.7 4.3 4.2 4.0 3.7  CL 108 108 108 108 104  CO2 15* 16* 18* 19* 19*  GLUCOSE 145* 108* 101* 107* 104*  BUN 85* 79* 76* 76* 85*  CREATININE 5.21* 5.30* 5.49* 5.61* 5.70*  CALCIUM 9.4 9.7 9.5 9.9 10.3  PHOS  --   --  6.7* 6.3* 6.3*   CBC Recent Labs  Lab 07/19/18 1858 07/20/18 1103 07/21/18 0659 07/22/18 0445 07/23/18 0416  WBC 8.8  --  10.3 11.5* 12.3*  NEUTROABS  --   --  7.7 8.4*  --   HGB 4.3* 8.3* 8.6* 9.1* 9.6*  HCT 14.5* 25.0* 25.2* 28.5* 29.3*  MCV 98.0  --  88.7 89.9 88.5  PLT 210  --  205 217 253    Medications:    . amLODipine  10 mg Oral QPC supper  . calcium acetate  1,334 mg Oral TID WC  .  Chlorhexidine Gluconate Cloth  6 each Topical Q0600  . cinacalcet  30 mg Oral Q breakfast  . darbepoetin (ARANESP) injection - NON-DIALYSIS  200 mcg Subcutaneous Q Tue-1800  . feeding supplement  1 Container Oral TID BM  . heparin injection (subcutaneous)  5,000 Units Subcutaneous Q8H  . mometasone-formoterol  2 puff Inhalation BID  . pantoprazole  40 mg Oral Daily  . sodium chloride flush  3 mL Intravenous Q12H   Elmarie Shiley, MD 07/23/2018, 2:50 PM

## 2018-07-23 NOTE — Transfer of Care (Signed)
Immediate Anesthesia Transfer of Care Note  Patient: Mallory Chavez  Procedure(s) Performed: ARTERIOVENOUS (AV) FISTULA CREATION (Left ) INSERTION OF DIALYSIS CATHETER (Right Neck)  Patient Location: PACU    Anesthesia Type:MAC  Level of Consciousness: awake, alert  and oriented  Airway & Oxygen Therapy: Patient Spontanous Breathing and Patient connected to nasal cannula oxygen  Post-op Assessment: Report given to RN, Post -op Vital signs reviewed and stable and Patient moving all extremities X 4  Post vital signs: Reviewed and stable  Last Vitals:  Vitals Value Taken Time  BP 126/100 07/23/2018 12:09 PM  Temp    Pulse 79 07/23/2018 12:11 PM  Resp 11 07/23/2018 12:11 PM  SpO2 100 % 07/23/2018 12:11 PM  Vitals shown include unvalidated device data.  Last Pain:  Vitals:   07/23/18 0750  TempSrc:   PainSc: 0-No pain         Complications: No apparent anesthesia complications

## 2018-07-23 NOTE — Anesthesia Preprocedure Evaluation (Addendum)
Anesthesia Evaluation  Patient identified by MRN, date of birth, ID band Patient awake    Reviewed: Allergy & Precautions, NPO status , Patient's Chart, lab work & pertinent test results  Airway Mallampati: II  TM Distance: >3 FB Neck ROM: Full    Dental  (+) Partial Lower, Partial Upper, Dental Advisory Given   Pulmonary asthma , COPD,  COPD inhaler, former smoker,    breath sounds clear to auscultation       Cardiovascular hypertension, Pt. on medications +CHF   Rhythm:Regular Rate:Normal     Neuro/Psych negative neurological ROS     GI/Hepatic GERD  Medicated,  Endo/Other    Renal/GU      Musculoskeletal  (+) Arthritis ,   Abdominal Normal abdominal exam  (+)   Peds  Hematology   Anesthesia Other Findings   Reproductive/Obstetrics                            Lab Results  Component Value Date   WBC 12.3 (H) 07/23/2018   HGB 9.6 (L) 07/23/2018   HCT 29.3 (L) 07/23/2018   MCV 88.5 07/23/2018   PLT 253 07/23/2018   Lab Results  Component Value Date   CREATININE 5.70 (H) 07/23/2018   BUN 85 (H) 07/23/2018   NA 137 07/23/2018   K 3.7 07/23/2018   CL 104 07/23/2018   CO2 19 (L) 07/23/2018   Lab Results  Component Value Date   INR 1.07 07/23/2018   INR 1.13 07/19/2018   INR 1.05 07/03/2016   Echo: - Left ventricle: The cavity size was normal. Wall thickness was   increased in a pattern of moderate LVH. The estimated ejection   fraction was 50%. Diffuse hypokinesis. Doppler parameters are   consistent with abnormal left ventricular relaxation (grade 1   diastolic dysfunction). - Aortic valve: Trileaflet; moderately calcified leaflets. There   was mild stenosis. Mean gradient (S): 12 mm Hg. - Mitral valve: There was trivial regurgitation. - Left atrium: The atrium was mildly dilated. - Right ventricle: The cavity size was normal. Systolic function   was normal. - Tricuspid  valve: Peak RV-RA gradient (S): 54 mm Hg. - Pulmonary arteries: PA peak pressure: 62 mm Hg (S). - Systemic veins: IVC measured 2.2 cm with > 50% respirophasic   variation, suggesting RA pressure 8 mmHg. - Pericardium, extracardiac: A trivial pericardial effusion was   identified.  EKG: normal sinus rhythm.  Anesthesia Physical Anesthesia Plan  ASA: III  Anesthesia Plan: MAC   Post-op Pain Management:    Induction: Intravenous  PONV Risk Score and Plan: 3 and Ondansetron, Dexamethasone and Midazolam  Airway Management Planned: Simple Face Mask and Natural Airway  Additional Equipment: None  Intra-op Plan:   Post-operative Plan: Extubation in OR  Informed Consent: I have reviewed the patients History and Physical, chart, labs and discussed the procedure including the risks, benefits and alternatives for the proposed anesthesia with the patient or authorized representative who has indicated his/her understanding and acceptance.       Plan Discussed with: CRNA  Anesthesia Plan Comments:        Anesthesia Quick Evaluation

## 2018-07-23 NOTE — Anesthesia Postprocedure Evaluation (Signed)
Anesthesia Post Note  Patient: Mallory Chavez  Procedure(s) Performed: ARTERIOVENOUS (AV) FISTULA CREATION (Left ) INSERTION OF DIALYSIS CATHETER (Right Neck)     Patient location during evaluation: PACU Anesthesia Type: MAC Level of consciousness: awake and alert Pain management: pain level controlled Vital Signs Assessment: post-procedure vital signs reviewed and stable Respiratory status: spontaneous breathing, nonlabored ventilation, respiratory function stable and patient connected to nasal cannula oxygen Cardiovascular status: stable and blood pressure returned to baseline Postop Assessment: no apparent nausea or vomiting Anesthetic complications: no    Last Vitals:  Vitals:   07/23/18 1238 07/23/18 1240  BP: (!) 141/62   Pulse: 78   Resp: 17   Temp:  36.5 C  SpO2: 100%     Last Pain:  Vitals:   07/23/18 1240  TempSrc:   PainSc: 0-No pain                 Effie Berkshire

## 2018-07-23 NOTE — Telephone Encounter (Signed)
sch appt lvm mld ltr 09/05/2018 10am Dialysis Duplex 11am p/o MD

## 2018-07-23 NOTE — Telephone Encounter (Signed)
sch appt lvm mldl ltr 09/05/2018 10am Dialysis duplex 11am p/o MD

## 2018-07-23 NOTE — Progress Notes (Signed)
Progress Note  Patient Name: Mallory Chavez Date of Encounter: 07/23/2018  Primary Cardiologist: Ndea Kilroy   Subjective   83 year old female with a history of end-stage renal disease.  Also found to have acute on chronic combined systolic and diastolic congestive heart failure.   She was given him a dose of Lasix yesterday.  Her creatinine continues to increase.    She is scheduled for fistula placement and dialysis catheter placement today     Inpatient Medications    Scheduled Meds: . amLODipine  10 mg Oral QPC supper  . calcium acetate  1,334 mg Oral TID WC  . cinacalcet  30 mg Oral Q breakfast  . darbepoetin (ARANESP) injection - NON-DIALYSIS  200 mcg Subcutaneous Q Tue-1800  . diltiazem  360 mg Oral Daily  . feeding supplement  1 Container Oral TID BM  . furosemide  160 mg Oral TID  . heparin injection (subcutaneous)  5,000 Units Subcutaneous Q8H  . mometasone-formoterol  2 puff Inhalation BID  . pantoprazole  40 mg Oral Daily  . sodium chloride flush  3 mL Intravenous Q12H   Continuous Infusions: . sodium chloride    . sodium chloride    . cefUROXime (ZINACEF)  IV     PRN Meds: sodium chloride, acetaminophen, methocarbamol, ondansetron (ZOFRAN) IV, sodium chloride flush   Vital Signs    Vitals:   07/22/18 2044 07/22/18 2215 07/23/18 0508 07/23/18 0850  BP:  (!) 143/72 (!) 144/62   Pulse:  88 77   Resp:  16 16   Temp:  98.2 F (36.8 C) 98.8 F (37.1 C)   TempSrc:  Oral Oral   SpO2: 98% 100% 100% 98%  Weight:   75.9 kg     Intake/Output Summary (Last 24 hours) at 07/23/2018 0934 Last data filed at 07/23/2018 0507 Gross per 24 hour  Intake 118 ml  Output 850 ml  Net -732 ml   Last 3 Weights 07/23/2018 07/22/2018 07/21/2018  Weight (lbs) 167 lb 5.3 oz 167 lb 8.8 oz 173 lb 8 oz  Weight (kg) 75.9 kg 76 kg 78.7 kg      Telemetry     NSR - Personally Reviewed  ECG       Physical Exam    Physical Exam: Blood pressure (!) 144/62, pulse 77,  temperature 98.8 F (37.1 C), temperature source Oral, resp. rate 16, weight 75.9 kg, SpO2 98 %.  GEN:  Well nourished, well developed in no acute distress HEENT: Normal NECK: No JVD; No carotid bruits LYMPHATICS: No lymphadenopathy CARDIAC: RRR , no murmurs, rubs, gallops RESPIRATORY:  Clear to auscultation without rales, wheezing or rhonchi  ABDOMEN: Soft, non-tender, non-distended MUSCULOSKELETAL:  No edema; No deformity  SKIN: Warm and dry NEUROLOGIC:  Alert and oriented x 3    Labs    Chemistry Recent Labs  Lab 07/19/18 1744  07/21/18 0659 07/22/18 0445 07/23/18 0416  NA 135   < > 139 138 137  K 4.7   < > 4.2 4.0 3.7  CL 108   < > 108 108 104  CO2 15*   < > 18* 19* 19*  GLUCOSE 145*   < > 101* 107* 104*  BUN 85*   < > 76* 76* 85*  CREATININE 5.21*   < > 5.49* 5.61* 5.70*  CALCIUM 9.4   < > 9.5 9.9 10.3  PROT 6.2*  --  5.8*  --   --   ALBUMIN 3.7  --  3.5 3.4* 3.4*  AST 14*  --  14*  --   --   ALT 12  --  11  --   --   ALKPHOS 57  --  62  --   --   BILITOT 0.4  --  0.6  --   --   GFRNONAA 7*   < > 7* 7* 6*  GFRAA 8*   < > 8* 8* 8*  ANIONGAP 12   < > 13 11 14    < > = values in this interval not displayed.     Hematology Recent Labs  Lab 07/21/18 0659 07/22/18 0445 07/23/18 0416  WBC 10.3 11.5* 12.3*  RBC 2.84* 3.17* 3.31*  HGB 8.6* 9.1* 9.6*  HCT 25.2* 28.5* 29.3*  MCV 88.7 89.9 88.5  MCH 30.3 28.7 29.0  MCHC 34.1 31.9 32.8  RDW 16.6* 16.6* 16.2*  PLT 205 217 253    Cardiac EnzymesNo results for input(s): TROPONINI in the last 168 hours.  Recent Labs  Lab 07/19/18 1804  TROPIPOC 0.05     BNP Recent Labs  Lab 07/19/18 1744  BNP 784.4*     DDimer No results for input(s): DDIMER in the last 168 hours.   Radiology    Vas [place] Upper Ext Vein Mapping (pre-op Avf)  Result Date: 07/21/2018 UPPER EXTREMITY VEIN MAPPING  Indications: Pre-access. Performing Technologist: Maudry Mayhew MHA, RDMS, RVT, RDCS  Examination Guidelines: A complete  evaluation includes B-mode imaging, spectral Doppler, color Doppler, and power Doppler as needed of all accessible portions of each vessel. Bilateral testing is considered an integral part of a complete examination. Limited examinations for reoccurring indications may be performed as noted. +-----------------+-------------+----------+--------------+ Right Cephalic   Diameter (cm)Depth (cm)   Findings    +-----------------+-------------+----------+--------------+ Shoulder             0.28        1.00                  +-----------------+-------------+----------+--------------+ Prox upper arm       0.12        0.56                  +-----------------+-------------+----------+--------------+ Mid upper arm        0.15        0.57                  +-----------------+-------------+----------+--------------+ Dist upper arm       0.09        0.49                  +-----------------+-------------+----------+--------------+ Antecubital fossa                       not visualized +-----------------+-------------+----------+--------------+ Prox forearm                            not visualized +-----------------+-------------+----------+--------------+ Mid forearm                             not visualized +-----------------+-------------+----------+--------------+ Dist forearm                            not visualized +-----------------+-------------+----------+--------------+ Wrist  not visualized +-----------------+-------------+----------+--------------+ +-----------------+-------------+----------+--------------+ Right Basilic    Diameter (cm)Depth (cm)   Findings    +-----------------+-------------+----------+--------------+ Shoulder                                not visualized +-----------------+-------------+----------+--------------+ Prox upper arm                          not visualized  +-----------------+-------------+----------+--------------+ Mid upper arm        0.40                              +-----------------+-------------+----------+--------------+ Dist upper arm       0.34                 branching    +-----------------+-------------+----------+--------------+ Antecubital fossa    0.23                              +-----------------+-------------+----------+--------------+ Prox forearm         0.17                              +-----------------+-------------+----------+--------------+ Mid forearm                             not visualized +-----------------+-------------+----------+--------------+ Distal forearm                          not visualized +-----------------+-------------+----------+--------------+ Wrist                                   not visualized +-----------------+-------------+----------+--------------+ +-----------------+-------------+----------+---------------------------------+ Left Cephalic    Diameter (cm)Depth (cm)            Findings              +-----------------+-------------+----------+---------------------------------+ Shoulder             0.29        0.71                                     +-----------------+-------------+----------+---------------------------------+ Prox upper arm       0.31        0.42                                     +-----------------+-------------+----------+---------------------------------+ Mid upper arm        0.32        0.43                                     +-----------------+-------------+----------+---------------------------------+ Dist upper arm       0.28        0.48                                     +-----------------+-------------+----------+---------------------------------+ Antecubital fossa  0.27        0.38                                     +-----------------+-------------+----------+---------------------------------+ Prox forearm          0.17        0.62                                     +-----------------+-------------+----------+---------------------------------+ Mid forearm                             not visualized and IV at location +-----------------+-------------+----------+---------------------------------+ Dist forearm         0.12        0.20                                     +-----------------+-------------+----------+---------------------------------+ Wrist                                            not visualized           +-----------------+-------------+----------+---------------------------------+ +-----------------+-------------+----------+--------------+ Left Basilic     Diameter (cm)Depth (cm)   Findings    +-----------------+-------------+----------+--------------+ Prox upper arm       0.34                              +-----------------+-------------+----------+--------------+ Mid upper arm        0.26                              +-----------------+-------------+----------+--------------+ Dist upper arm       0.31                 branching    +-----------------+-------------+----------+--------------+ Antecubital fossa    0.20                              +-----------------+-------------+----------+--------------+ Prox forearm         0.21                              +-----------------+-------------+----------+--------------+ Mid forearm          0.21                              +-----------------+-------------+----------+--------------+ Distal forearm       0.17                              +-----------------+-------------+----------+--------------+ Wrist                                   not visualized +-----------------+-------------+----------+--------------+ *See table(s) above for measurements and observations.  Diagnosing physician: Ruta Hinds MD Electronically signed by Ruta Hinds MD on 07/21/2018 at  5:25:53 PM.    Final     Cardiac Studies        Patient Profile     83 y.o. female with progressive renal failure   Assessment & Plan    1.  Acute on chronic systolic congestive heart failure: Scheduled to have dialysis catheter placed today  Anticipate initiation of dialysis soon  I suspect her CHF symptoms will significantly improve with dialysis   2.  Anemia: Her work-up per internal medicine team   [facility] will sign off.   Medication Recommendations:  Per IM and nephrology  Other recommendations (labs, testing, etc):   Follow up as an outpatient:  With primary MD and with nephrology . She may follow up with [place] in 4-6 weeks .   For questions or updates, please contact [place] Please consult [email or web address] for contact info under        Signed, Mertie Moores, MD  07/23/2018, 9:34 AM

## 2018-07-23 NOTE — Progress Notes (Signed)
  Progress Note    07/23/2018 10:32 AM Day of Surgery  Subjective:  No overnight issues  Vitals:   07/23/18 0508 07/23/18 0850  BP: (!) 144/62   Pulse: 77   Resp: 16   Temp: 98.8 F (37.1 C)   SpO2: 100% 98%    Physical Exam: aaox3 Non labored respirations Palpable left radial pulse  CBC    Component Value Date/Time   WBC 12.3 (H) 07/23/2018 0416   RBC 3.31 (L) 07/23/2018 0416   HGB 9.6 (L) 07/23/2018 0416   HCT 29.3 (L) 07/23/2018 0416   PLT 253 07/23/2018 0416   MCV 88.5 07/23/2018 0416   MCH 29.0 07/23/2018 0416   MCHC 32.8 07/23/2018 0416   RDW 16.2 (H) 07/23/2018 0416   LYMPHSABS 1.6 07/22/2018 0445   MONOABS 1.2 (H) 07/22/2018 0445   EOSABS 0.3 07/22/2018 0445   BASOSABS 0.0 07/22/2018 0445    BMET    Component Value Date/Time   NA 137 07/23/2018 0416   K 3.7 07/23/2018 0416   CL 104 07/23/2018 0416   CO2 19 (L) 07/23/2018 0416   GLUCOSE 104 (H) 07/23/2018 0416   BUN 85 (H) 07/23/2018 0416   CREATININE 5.70 (H) 07/23/2018 0416   CALCIUM 10.3 07/23/2018 0416   GFRNONAA 6 (L) 07/23/2018 0416   GFRAA 8 (L) 07/23/2018 0416    INR    Component Value Date/Time   INR 1.07 07/23/2018 0416     Intake/Output Summary (Last 24 hours) at 07/23/2018 1032 Last data filed at 07/23/2018 0507 Gross per 24 hour  Intake 118 ml  Output 850 ml  Net -732 ml     Assessment:  83 y.o. female is here with need for hd access  Plan: OR today for [facility] and left arm avf vs graft   Nareh Matzke C. Donzetta Matters, MD [facility] Office: [phone] Pager: [phone]  07/23/2018 10:32 AM

## 2018-07-24 ENCOUNTER — Encounter (HOSPITAL_COMMUNITY): Payer: Self-pay | Admitting: Vascular Surgery

## 2018-07-24 LAB — CBC
HCT: 27.4 % — ABNORMAL LOW (ref 36.0–46.0)
Hemoglobin: 8.7 g/dL — ABNORMAL LOW (ref 12.0–15.0)
MCH: 28.5 pg (ref 26.0–34.0)
MCHC: 31.8 g/dL (ref 30.0–36.0)
MCV: 89.8 fL (ref 80.0–100.0)
Platelets: 224 10*3/uL (ref 150–400)
RBC: 3.05 MIL/uL — ABNORMAL LOW (ref 3.87–5.11)
RDW: 16.3 % — ABNORMAL HIGH (ref 11.5–15.5)
WBC: 11.4 10*3/uL — ABNORMAL HIGH (ref 4.0–10.5)
nRBC: 0 % (ref 0.0–0.2)

## 2018-07-24 LAB — RENAL FUNCTION PANEL
ALBUMIN: 3.4 g/dL — AB (ref 3.5–5.0)
Anion gap: 12 (ref 5–15)
BUN: 53 mg/dL — ABNORMAL HIGH (ref 8–23)
CO2: 22 mmol/L (ref 22–32)
Calcium: 10.2 mg/dL (ref 8.9–10.3)
Chloride: 102 mmol/L (ref 98–111)
Creatinine, Ser: 4.11 mg/dL — ABNORMAL HIGH (ref 0.44–1.00)
GFR calc Af Amer: 11 mL/min — ABNORMAL LOW (ref >60)
GFR calc non Af Amer: 10 mL/min — ABNORMAL LOW (ref >60)
Glucose, Bld: 115 mg/dL — ABNORMAL HIGH (ref 70–99)
PHOSPHORUS: 5.4 mg/dL — AB (ref 2.5–4.6)
Potassium: 4.3 mmol/L (ref 3.5–5.1)
Sodium: 136 mmol/L (ref 135–145)

## 2018-07-24 LAB — HEPATITIS B SURFACE ANTIGEN: Hepatitis B Surface Ag: NEGATIVE

## 2018-07-24 LAB — HEPATITIS B SURFACE ANTIBODY,QUALITATIVE: Hep B S Ab: NONREACTIVE

## 2018-07-24 LAB — GLUCOSE, CAPILLARY: Glucose-Capillary: 107 mg/dL — ABNORMAL HIGH (ref 70–99)

## 2018-07-24 LAB — HEPATITIS B CORE ANTIBODY, TOTAL: HEP B C TOTAL AB: NEGATIVE

## 2018-07-24 MED ORDER — HEPARIN SODIUM (PORCINE) 1000 UNIT/ML IJ SOLN
1000.0000 [IU] | INTRAMUSCULAR | Status: DC | PRN
  Administered 2018-07-24: 3000 [IU] via INTRAVENOUS
  Administered 2018-07-25: 1000 [IU] via INTRAVENOUS
  Filled 2018-07-24 (×3): qty 1

## 2018-07-24 MED ORDER — RENA-VITE PO TABS
1.0000 | ORAL_TABLET | Freq: Every day | ORAL | Status: DC
  Administered 2018-07-24 – 2018-07-27 (×4): 1 via ORAL
  Filled 2018-07-24 (×4): qty 1

## 2018-07-24 MED ORDER — HEPARIN SODIUM (PORCINE) 1000 UNIT/ML IJ SOLN
INTRAMUSCULAR | Status: AC
  Administered 2018-07-24: 3000 [IU] via INTRAVENOUS
  Filled 2018-07-24: qty 3

## 2018-07-24 NOTE — Progress Notes (Signed)
PROGRESS NOTE        PATIENT DETAILS Name: Mallory Chavez Age: 83 y.o. Sex: female Date of Birth: [birth year] Admit Date: 07/19/2018 Admitting Physician Vianne Bulls, MD [id number], Annie Main, MD  Brief Narrative: Patient is a 83 y.o. female with history of chronic diastolic and systolic heart failure, CKD stage IV, with shortness of breath-was found to have acute hypoxic respiratory failure in the setting of decompensated heart failure with worsening renal function.  Point-of-care, diuretics-followed closely by cardiology/nephrology-now thought to have ESRD.  See below for further details.  Subjective: Lying comfortably in bed-no major events overnight.  Denies any chest pain or shortness of breath-but has not yet ambulated this morning.  Assessment/Plan: Acute hypoxic respiratory failure: Secondary to decompensated combined systolic and diastolic heart failure-she was initially on BiPAP but has been liberated and is stable on just 2 L of nasal cannula.  Titrate slowly off oxygen.    Acute on chronic combined systolic and diastolic heart failure: Volume status is improved-she was refractory to diuretic therapy-and was started on HD.  Volume status was markedly improved  Acute kidney injury on CKD stage IV-now with ESRD: Has been started on HD-nephrology following.  Clipping process underway for outpatient HD chair.  Was evaluated by VS-underwent tunneled dialysis catheter placement and left arm brachiocephalic AV fistula on 0/10.  Anemia: Hemoglobin was 4.3 on admission-she denies any overt GI bleeding-suspicion for worsening CKD and possible occult GI bleeding as the etiology.  She did require a total of 3 units of PRBC-hemoglobin has improved and is stable over the past few days.  Nephrology following and dosing IV iron and Aranesp.  Prior MD (Dr Starla Link) discussed with GI MD (Dr. Edison Nasuti) on 1/19-given tenuous respiratory/renal status-GI deferred any further  endoscopic evaluation at this time.  Since there is no overt bleeding-hemoglobin is now stable over the past few days-suspect this can be pursued in the outpatient setting.    Hypertension: Controlled with amlodipine.  Follow.  COPD: Stable without any signs of exacerbation-continue bronchodilators  GERD: Continue PPI  Deconditioning/debility: Await PT evaluation  DVT Prophylaxis: Prophylactic Heparin   Code Status:  DNR  Family Communication: None at bedside  Disposition Plan: Remain inpatient-home when outpatient HD chair is available.  Await PT evaluation as well  Antimicrobial agents: Anti-infectives (From admission, onward)   Start     Dose/Rate Route Frequency Ordered Stop   07/23/18 0600  cefUROXime (ZINACEF) 1.5 g in sodium chloride 0.9 % 100 mL IVPB     1.5 g 200 mL/hr over 30 Minutes Intravenous On call to O.R. 07/22/18 9323 07/24/18 0559      Procedures: None  CONSULTS:  cardiology, GI and vascular surgery  Time spent: 25- minutes-Greater than 50% of this time was spent in counseling, explanation of diagnosis, planning of further management, and coordination of care.  MEDICATIONS: Scheduled Meds: . amLODipine  10 mg Oral QPC supper  . calcium acetate  1,334 mg Oral TID WC  . Chlorhexidine Gluconate Cloth  6 each Topical Q0600  . cinacalcet  30 mg Oral Q breakfast  . darbepoetin (ARANESP) injection - NON-DIALYSIS  200 mcg Subcutaneous Q Tue-1800  . feeding supplement  1 Container Oral TID BM  . heparin injection (subcutaneous)  5,000 Units Subcutaneous Q8H  . mometasone-formoterol  2 puff Inhalation BID  . pantoprazole  40 mg Oral Daily  .  sodium chloride flush  3 mL Intravenous Q12H   Continuous Infusions: . sodium chloride    . sodium chloride     PRN Meds:.sodium chloride, acetaminophen, hydrALAZINE, HYDROcodone-acetaminophen, methocarbamol, ondansetron (ZOFRAN) IV, sodium chloride flush   PHYSICAL EXAM: Vital signs: Vitals:   07/23/18 2239  07/23/18 2240 07/24/18 0538 07/24/18 0818  BP: (!) 162/70  135/73   Pulse: 99  86   Resp: 12  12   Temp: 98.8 F (37.1 C)  99.3 F (37.4 C)   TempSrc: Oral  Oral   SpO2: 100% 100% 100% 98%  Weight:   75.4 kg    Filed Weights   07/23/18 1846 07/23/18 2135 07/24/18 0538  Weight: 75.1 kg 74.6 kg 75.4 kg   Body mass index is 29.45 kg/m.   General appearance:Awake, alert, not in any distress.  Eyes:no scleral icterus. HEENT: Atraumatic and Normocephalic Neck: supple, no JVD. Resp:Good air entry bilaterally,no rales or rhonchi CVS: S1 S2 regular, no murmurs.  GI: Bowel sounds present, Non tender and not distended with no gaurding, rigidity or rebound. Extremities: B/L Lower Ext shows no edema, both legs are warm to touch Neurology:  Non focal Psychiatric: Normal judgment and insight. Normal mood. Musculoskeletal:No digital cyanosis Skin:No Rash, warm and dry Wounds:N/A  I have personally reviewed following labs and imaging studies  LABORATORY DATA: CBC: Recent Labs  Lab 07/19/18 1744 07/19/18 1858 07/20/18 1103 07/21/18 0659 07/22/18 0445 07/23/18 0416  WBC 9.2 8.8  --  10.3 11.5* 12.3*  NEUTROABS  --   --   --  7.7 8.4*  --   HGB 4.4* 4.3* 8.3* 8.6* 9.1* 9.6*  HCT 14.3* 14.5* 25.0* 25.2* 28.5* 29.3*  MCV 92.3 98.0  --  88.7 89.9 88.5  PLT 218 210  --  205 217 106    Basic Metabolic Panel: Recent Labs  Lab 07/20/18 1103 07/21/18 0659 07/22/18 0445 07/23/18 0416 07/24/18 0508  NA 136 139 138 137 136  K 4.3 4.2 4.0 3.7 4.3  CL 108 108 108 104 102  CO2 16* 18* 19* 19* 22  GLUCOSE 108* 101* 107* 104* 115*  BUN 79* 76* 76* 85* 53*  CREATININE 5.30* 5.49* 5.61* 5.70* 4.11*  CALCIUM 9.7 9.5 9.9 10.3 10.2  MG  --  2.0 1.9 1.8  --   PHOS  --  6.7* 6.3* 6.3* 5.4*    GFR: Estimated Creatinine Clearance: 10.6 mL/min (A) (by C-G formula based on SCr of 4.11 mg/dL (H)).  Liver Function Tests: Recent Labs  Lab 07/19/18 1744 07/21/18 0659 07/22/18 0445  07/23/18 0416 07/24/18 0508  AST 14* 14*  --   --   --   ALT 12 11  --   --   --   ALKPHOS 57 62  --   --   --   BILITOT 0.4 0.6  --   --   --   PROT 6.2* 5.8*  --   --   --   ALBUMIN 3.7 3.5 3.4* 3.4* 3.4*   No results for input(s): LIPASE, AMYLASE in the last 168 hours. No results for input(s): AMMONIA in the last 168 hours.  Coagulation Profile: Recent Labs  Lab 07/19/18 1744 07/23/18 0416  INR 1.13 1.07    Cardiac Enzymes: No results for input(s): CKTOTAL, CKMB, CKMBINDEX, TROPONINI in the last 168 hours.  BNP (last 3 results) No results for input(s): PROBNP in the last 8760 hours.  HbA1C: No results for input(s): HGBA1C in the last 72 hours.  CBG: Recent Labs  Lab 07/23/18 1212  GLUCAP 107*    Lipid Profile: No results for input(s): CHOL, HDL, LDLCALC, TRIG, CHOLHDL, LDLDIRECT in the last 72 hours.  Thyroid Function Tests: No results for input(s): TSH, T4TOTAL, FREET4, T3FREE, THYROIDAB in the last 72 hours.  Anemia Panel: No results for input(s): VITAMINB12, FOLATE, FERRITIN, TIBC, IRON, RETICCTPCT in the last 72 hours.  Urine analysis:    Component Value Date/Time   COLORURINE YELLOW 07/02/2016 1058   APPEARANCEUR CLEAR 07/02/2016 1058   LABSPEC 1.010 07/02/2016 1058   PHURINE 6.0 07/02/2016 1058   GLUCOSEU NEGATIVE 07/02/2016 1058   HGBUR NEGATIVE 07/02/2016 1058   BILIRUBINUR NEGATIVE 07/02/2016 1058   [place] 07/02/2016 1058   PROTEINUR 30 (A) 07/02/2016 1058   NITRITE NEGATIVE 07/02/2016 1058   LEUKOCYTESUR SMALL (A) 07/02/2016 1058    Sepsis Labs: Lactic Acid, Venous    Component Value Date/Time   LATICACIDVEN 1.14 07/19/2018 1806    MICROBIOLOGY: Recent Results (from the past 240 hour(s))  MRSA PCR Screening     Status: None   Collection Time: 07/19/18 10:08 PM  Result Value Ref Range Status   MRSA by PCR NEGATIVE NEGATIVE Final    Comment:        The GeneXpert MRSA Assay (FDA approved for NASAL specimens only), is  one component of a comprehensive MRSA colonization surveillance program. It is not intended to diagnose MRSA infection nor to guide or monitor treatment for MRSA infections. Performed at [facility], [place] [address], [place], [place] [address]     RADIOLOGY STUDIES/RESULTS: Dg Chest Port 1 View  Result Date: 07/23/2018 CLINICAL DATA:  Status post dialysis catheter insertion. EXAM: PORTABLE CHEST 1 VIEW COMPARISON:  07/19/2018 FINDINGS: Right chest wall dialysis catheter is noted with tip at the cavoatrial junction. No pneumothorax identified. Mild cardiac enlargement and aortic atherosclerosis. Scarring is identified within both lower lobes. No pleural effusion or edema identified. IMPRESSION: 1. No complications identified status post right chest wall dialysis catheter placement. 2. Cardiac enlargement 3. Bilateral lower lobe scarring. Electronically Signed   By: Kerby Moors M.D.   On: 07/23/2018 12:49   Dg Chest Port 1 View  Result Date: 07/19/2018 CLINICAL DATA:  Shortness of breath for 3 days EXAM: PORTABLE CHEST 1 VIEW COMPARISON:  02/06/2018 FINDINGS: Cardiac shadow is enlarged but stable. Lungs are well aerated bilaterally. No focal infiltrate or sizable effusion is seen. No acute bony abnormality is noted. IMPRESSION: Stable cardiomegaly.  No acute abnormality noted. Electronically Signed   By: Inez Catalina M.D.   On: 07/19/2018 18:26   Dg Fluoro Guide Cv Line-no Report  Result Date: 07/23/2018 Fluoroscopy was utilized by the requesting physician.  No radiographic interpretation.   Vas [place] Upper Ext Vein Mapping (pre-op Avf)  Result Date: 07/21/2018 UPPER EXTREMITY VEIN MAPPING  Indications: Pre-access. Performing Technologist: Maudry Mayhew MHA, RDMS, RVT, RDCS  Examination Guidelines: A complete evaluation includes B-mode imaging, spectral Doppler, color Doppler, and power Doppler as needed of all accessible portions of each vessel. Bilateral testing is considered an  integral part of a complete examination. Limited examinations for reoccurring indications may be performed as noted. +-----------------+-------------+----------+--------------+ Right Cephalic   Diameter (cm)Depth (cm)   Findings    +-----------------+-------------+----------+--------------+ Shoulder             0.28        1.00                  +-----------------+-------------+----------+--------------+ Prox  upper arm       0.12        0.56                  +-----------------+-------------+----------+--------------+ Mid upper arm        0.15        0.57                  +-----------------+-------------+----------+--------------+ Dist upper arm       0.09        0.49                  +-----------------+-------------+----------+--------------+ Antecubital fossa                       not visualized +-----------------+-------------+----------+--------------+ Prox forearm                            not visualized +-----------------+-------------+----------+--------------+ Mid forearm                             not visualized +-----------------+-------------+----------+--------------+ Dist forearm                            not visualized +-----------------+-------------+----------+--------------+ Wrist                                   not visualized +-----------------+-------------+----------+--------------+ +-----------------+-------------+----------+--------------+ Right Basilic    Diameter (cm)Depth (cm)   Findings    +-----------------+-------------+----------+--------------+ Shoulder                                not visualized +-----------------+-------------+----------+--------------+ Prox upper arm                          not visualized +-----------------+-------------+----------+--------------+ Mid upper arm        0.40                              +-----------------+-------------+----------+--------------+ Dist upper arm       0.34                  branching    +-----------------+-------------+----------+--------------+ Antecubital fossa    0.23                              +-----------------+-------------+----------+--------------+ Prox forearm         0.17                              +-----------------+-------------+----------+--------------+ Mid forearm                             not visualized +-----------------+-------------+----------+--------------+ Distal forearm                          not visualized +-----------------+-------------+----------+--------------+ Wrist  not visualized +-----------------+-------------+----------+--------------+ +-----------------+-------------+----------+---------------------------------+ Left Cephalic    Diameter (cm)Depth (cm)            Findings              +-----------------+-------------+----------+---------------------------------+ Shoulder             0.29        0.71                                     +-----------------+-------------+----------+---------------------------------+ Prox upper arm       0.31        0.42                                     +-----------------+-------------+----------+---------------------------------+ Mid upper arm        0.32        0.43                                     +-----------------+-------------+----------+---------------------------------+ Dist upper arm       0.28        0.48                                     +-----------------+-------------+----------+---------------------------------+ Antecubital fossa    0.27        0.38                                     +-----------------+-------------+----------+---------------------------------+ Prox forearm         0.17        0.62                                     +-----------------+-------------+----------+---------------------------------+ Mid forearm                             not visualized and IV at  location +-----------------+-------------+----------+---------------------------------+ Dist forearm         0.12        0.20                                     +-----------------+-------------+----------+---------------------------------+ Wrist                                            not visualized           +-----------------+-------------+----------+---------------------------------+ +-----------------+-------------+----------+--------------+ Left Basilic     Diameter (cm)Depth (cm)   Findings    +-----------------+-------------+----------+--------------+ Prox upper arm       0.34                              +-----------------+-------------+----------+--------------+ Mid upper arm        0.26                              +-----------------+-------------+----------+--------------+  Dist upper arm       0.31                 branching    +-----------------+-------------+----------+--------------+ Antecubital fossa    0.20                              +-----------------+-------------+----------+--------------+ Prox forearm         0.21                              +-----------------+-------------+----------+--------------+ Mid forearm          0.21                              +-----------------+-------------+----------+--------------+ Distal forearm       0.17                              +-----------------+-------------+----------+--------------+ Wrist                                   not visualized +-----------------+-------------+----------+--------------+ *See table(s) above for measurements and observations.  Diagnosing physician: Ruta Hinds MD Electronically signed by Ruta Hinds MD on 07/21/2018 at 5:25:53 PM.    Final      LOS: 5 days   Oren Binet, MD  [facility]  If 7PM-7AM, please contact night-coverage  Please page via [email or web address]-Password [email or web address]-click on MD name and type text message  07/24/2018, 10:05 AM

## 2018-07-24 NOTE — Progress Notes (Signed)
Progress Note  Patient Name: Mallory Chavez Date of Encounter: 07/24/2018  Primary Cardiologist: Kalinda Romaniello   Subjective   83 year old female with a history of end-stage renal disease.  Also found to have acute on chronic combined systolic and diastolic congestive heart failure.   Started dialysis yesterday   Feeling better.    Inpatient Medications    Scheduled Meds: . amLODipine  10 mg Oral QPC supper  . calcium acetate  1,334 mg Oral TID WC  . Chlorhexidine Gluconate Cloth  6 each Topical Q0600  . cinacalcet  30 mg Oral Q breakfast  . darbepoetin (ARANESP) injection - NON-DIALYSIS  200 mcg Subcutaneous Q Tue-1800  . feeding supplement  1 Container Oral TID BM  . heparin injection (subcutaneous)  5,000 Units Subcutaneous Q8H  . mometasone-formoterol  2 puff Inhalation BID  . pantoprazole  40 mg Oral Daily  . sodium chloride flush  3 mL Intravenous Q12H   Continuous Infusions: . sodium chloride    . sodium chloride     PRN Meds: sodium chloride, acetaminophen, hydrALAZINE, HYDROcodone-acetaminophen, methocarbamol, ondansetron (ZOFRAN) IV, sodium chloride flush   Vital Signs    Vitals:   07/23/18 2239 07/23/18 2240 07/24/18 0538 07/24/18 0818  BP: (!) 162/70  135/73   Pulse: 99  86   Resp: 12  12   Temp: 98.8 F (37.1 C)  99.3 F (37.4 C)   TempSrc: Oral  Oral   SpO2: 100% 100% 100% 98%  Weight:   75.4 kg     Intake/Output Summary (Last 24 hours) at 07/24/2018 1027 Last data filed at 07/23/2018 2135 Gross per 24 hour  Intake 250 ml  Output 1000 ml  Net -750 ml   Last 3 Weights 07/24/2018 07/23/2018 07/23/2018  Weight (lbs) 166 lb 3.6 oz 164 lb 7.4 oz 165 lb 9.1 oz  Weight (kg) 75.4 kg 74.6 kg 75.1 kg      Telemetry     NSR - Personally Reviewed  ECG       Physical Exam   Physical Exam: Blood pressure 135/73, pulse 86, temperature 99.3 F (37.4 C), temperature source Oral, resp. rate 12, weight 75.4 kg, SpO2 98 %.  GEN:   Elderly female,  NAD    HEENT: Normal NECK: No JVD; No carotid bruits LYMPHATICS: No lymphadenopathy CARDIAC:  RR  RESPIRATORY:  Clear to auscultation without rales, wheezing or rhonchi  ABDOMEN: Soft, non-tender, non-distended MUSCULOSKELETAL:  No edema; No deformity  SKIN: Warm and dry NEUROLOGIC:  Alert and oriented x 3     Labs    Chemistry Recent Labs  Lab 07/19/18 1744  07/21/18 0659 07/22/18 0445 07/23/18 0416 07/24/18 0508  NA 135   < > 139 138 137 136  K 4.7   < > 4.2 4.0 3.7 4.3  CL 108   < > 108 108 104 102  CO2 15*   < > 18* 19* 19* 22  GLUCOSE 145*   < > 101* 107* 104* 115*  BUN 85*   < > 76* 76* 85* 53*  CREATININE 5.21*   < > 5.49* 5.61* 5.70* 4.11*  CALCIUM 9.4   < > 9.5 9.9 10.3 10.2  PROT 6.2*  --  5.8*  --   --   --   ALBUMIN 3.7  --  3.5 3.4* 3.4* 3.4*  AST 14*  --  14*  --   --   --   ALT 12  --  11  --   --   --  ALKPHOS 57  --  62  --   --   --   BILITOT 0.4  --  0.6  --   --   --   GFRNONAA 7*   < > 7* 7* 6* 10*  GFRAA 8*   < > 8* 8* 8* 11*  ANIONGAP 12   < > 13 11 14 12    < > = values in this interval not displayed.     Hematology Recent Labs  Lab 07/21/18 0659 07/22/18 0445 07/23/18 0416  WBC 10.3 11.5* 12.3*  RBC 2.84* 3.17* 3.31*  HGB 8.6* 9.1* 9.6*  HCT 25.2* 28.5* 29.3*  MCV 88.7 89.9 88.5  MCH 30.3 28.7 29.0  MCHC 34.1 31.9 32.8  RDW 16.6* 16.6* 16.2*  PLT 205 217 253    Cardiac EnzymesNo results for input(s): TROPONINI in the last 168 hours.  Recent Labs  Lab 07/19/18 1804  TROPIPOC 0.05     BNP Recent Labs  Lab 07/19/18 1744  BNP 784.4*     DDimer No results for input(s): DDIMER in the last 168 hours.   Radiology    Dg Chest Port 1 View  Result Date: 07/23/2018 CLINICAL DATA:  Status post dialysis catheter insertion. EXAM: PORTABLE CHEST 1 VIEW COMPARISON:  07/19/2018 FINDINGS: Right chest wall dialysis catheter is noted with tip at the cavoatrial junction. No pneumothorax identified. Mild cardiac enlargement and aortic  atherosclerosis. Scarring is identified within both lower lobes. No pleural effusion or edema identified. IMPRESSION: 1. No complications identified status post right chest wall dialysis catheter placement. 2. Cardiac enlargement 3. Bilateral lower lobe scarring. Electronically Signed   By: Kerby Moors M.D.   On: 07/23/2018 12:49   Dg Fluoro Guide Cv Line-no Report  Result Date: 07/23/2018 Fluoroscopy was utilized by the requesting physician.  No radiographic interpretation.    Cardiac Studies      Patient Profile     83 y.o. female with progressive renal failure   Assessment & Plan    1.  Acute on chronic systolic congestive heart failure: I suspect most / all of her CHF symptoms will improve significantly with dialysis.    [facility] will sign off.   Medication Recommendations:  Per IM and nephrology  Other recommendations (labs, testing, etc):   Follow up as an outpatient:  With primary MD and with nephrology . She may follow up with [place] in 4-6 weeks .   For questions or updates, please contact [place] Please consult [email or web address] for contact info under        Signed, Mertie Moores, MD  07/24/2018, 10:27 AM

## 2018-07-24 NOTE — Progress Notes (Signed)
  Progress Note    07/24/2018 9:11 AM 1 Day Post-Op  Subjective:  Denies signs or symptoms of steal syndrome L hand; patient states TDC "had some clots" during dialysis last night but was corrected when flushed.   Vitals:   07/24/18 0538 07/24/18 0818  BP: 135/73   Pulse: 86   Resp: 12   Temp: 99.3 F (37.4 C)   SpO2: 100% 98%   Physical Exam: Lungs:  Non labored Incisions:  R TDC incision c/d/i; L AC fossa incision c/d/i, local edema but soft Extremities:  Palpable L radial pulse; palpable thrill in distal forearm, audible bruit throughout Neurologic: grip strength symmetrical; sensation L hand intact  CBC    Component Value Date/Time   WBC 12.3 (H) 07/23/2018 0416   RBC 3.31 (L) 07/23/2018 0416   HGB 9.6 (L) 07/23/2018 0416   HCT 29.3 (L) 07/23/2018 0416   PLT 253 07/23/2018 0416   MCV 88.5 07/23/2018 0416   MCH 29.0 07/23/2018 0416   MCHC 32.8 07/23/2018 0416   RDW 16.2 (H) 07/23/2018 0416   LYMPHSABS 1.6 07/22/2018 0445   MONOABS 1.2 (H) 07/22/2018 0445   EOSABS 0.3 07/22/2018 0445   BASOSABS 0.0 07/22/2018 0445    BMET    Component Value Date/Time   NA 136 07/24/2018 0508   K 4.3 07/24/2018 0508   CL 102 07/24/2018 0508   CO2 22 07/24/2018 0508   GLUCOSE 115 (H) 07/24/2018 0508   BUN 53 (H) 07/24/2018 0508   CREATININE 4.11 (H) 07/24/2018 0508   CALCIUM 10.2 07/24/2018 0508   GFRNONAA 10 (L) 07/24/2018 0508   GFRAA 11 (L) 07/24/2018 0508    INR    Component Value Date/Time   INR 1.07 07/23/2018 0416     Intake/Output Summary (Last 24 hours) at 07/24/2018 0911 Last data filed at 07/23/2018 2135 Gross per 24 hour  Intake 250 ml  Output 1000 ml  Net -750 ml     Assessment/Plan:  83 y.o. female is s/p R IJ TDC and L brachiocephalic fistula 1 Day Post-Op   Patent L arm fistula without steal signs or symptoms Fistula duplex in 4-6 weeks; office will call to arrange Call if [facility] not functioning properly Vascular will sign off   Dagoberto Ligas, PA-C [facility] [phone] 07/24/2018 9:11 AM

## 2018-07-24 NOTE — Progress Notes (Signed)
Patient ID: Mallory Chavez, female   DOB: [birth year], 83 y.o.   MRN: [id number] [facility] Progress Note   Assessment/ Plan:   1.  Progressive chronic kidney disease now end-stage renal disease: With progressive development of uremic symptoms and volume overload in the setting of advancing chronic kidney disease for which he underwent right IJ TDC, left BCF and initiation of hemodialysis yesterday with second treatment due today.  The process is underway for outpatient dialysis unit placement. 2.  Hypertension: Blood pressures slightly elevated, monitor with ongoing diuretics and thereafter HD/UF. 3.  Anemia of chronic kidney disease: Iron saturation septuple range but low ferritin, continue ESA to improve hemoglobin/hematocrit status post PRBC.   4.  Secondary hyperparathyroidism: On cinacalcet for PTH suppression, continue calcium acetate for phosphorus binding. 5.  Volume overload/CHF exacerbation: Monitor with ultrafiltration/hemodialysis.  Subjective:   Reports to be feeling well, denies any chest pain or shortness of breath.   Objective:   BP 135/73 (BP Location: Right Arm)   Pulse 86   Temp 99.3 F (37.4 C) (Oral)   Resp 12   Wt 75.4 kg   SpO2 98%   BMI 29.45 kg/m   Intake/Output Summary (Last 24 hours) at 07/24/2018 1058 Last data filed at 07/23/2018 2135 Gross per 24 hour  Intake 250 ml  Output 1000 ml  Net -750 ml   Weight change: -0.9 kg  Physical Exam: Gen: Comfortably resting in bed, watching TV CVS: Pulse regular rhythm, normal S1 and S2 without gallop Resp: Diminished breath sounds over bases, no distinct rales.  Right IJ TDC Abd: Soft, obese, nontender Ext: 1-2+ lower extremity edema.  Left BCF with palpable thrill  Imaging: Dg Chest Port 1 View  Result Date: 07/23/2018 CLINICAL DATA:  Status post dialysis catheter insertion. EXAM: PORTABLE CHEST 1 VIEW COMPARISON:  07/19/2018 FINDINGS: Right chest wall dialysis catheter is noted with tip at  the cavoatrial junction. No pneumothorax identified. Mild cardiac enlargement and aortic atherosclerosis. Scarring is identified within both lower lobes. No pleural effusion or edema identified. IMPRESSION: 1. No complications identified status post right chest wall dialysis catheter placement. 2. Cardiac enlargement 3. Bilateral lower lobe scarring. Electronically Signed   By: Kerby Moors M.D.   On: 07/23/2018 12:49   Dg Fluoro Guide Cv Line-no Report  Result Date: 07/23/2018 Fluoroscopy was utilized by the requesting physician.  No radiographic interpretation.    Labs: BMET Recent Labs  Lab 07/19/18 1744 07/20/18 1103 07/21/18 0659 07/22/18 0445 07/23/18 0416 07/24/18 0508  NA 135 136 139 138 137 136  K 4.7 4.3 4.2 4.0 3.7 4.3  CL 108 108 108 108 104 102  CO2 15* 16* 18* 19* 19* 22  GLUCOSE 145* 108* 101* 107* 104* 115*  BUN 85* 79* 76* 76* 85* 53*  CREATININE 5.21* 5.30* 5.49* 5.61* 5.70* 4.11*  CALCIUM 9.4 9.7 9.5 9.9 10.3 10.2  PHOS  --   --  6.7* 6.3* 6.3* 5.4*   CBC Recent Labs  Lab 07/19/18 1858 07/20/18 1103 07/21/18 0659 07/22/18 0445 07/23/18 0416  WBC 8.8  --  10.3 11.5* 12.3*  NEUTROABS  --   --  7.7 8.4*  --   HGB 4.3* 8.3* 8.6* 9.1* 9.6*  HCT 14.5* 25.0* 25.2* 28.5* 29.3*  MCV 98.0  --  88.7 89.9 88.5  PLT 210  --  205 217 253    Medications:    . amLODipine  10 mg Oral QPC supper  . calcium acetate  1,334 mg  Oral TID WC  . Chlorhexidine Gluconate Cloth  6 each Topical Q0600  . cinacalcet  30 mg Oral Q breakfast  . darbepoetin (ARANESP) injection - NON-DIALYSIS  200 mcg Subcutaneous Q Tue-1800  . feeding supplement  1 Container Oral TID BM  . heparin injection (subcutaneous)  5,000 Units Subcutaneous Q8H  . mometasone-formoterol  2 puff Inhalation BID  . pantoprazole  40 mg Oral Daily  . sodium chloride flush  3 mL Intravenous Q12H   Elmarie Shiley, MD 07/24/2018, 10:58 AM

## 2018-07-24 NOTE — Progress Notes (Signed)
  Progress Note    07/24/2018 1:21 PM 1 Day Post-Op  Subjective: No overnight issues  Vitals:   07/24/18 0538 07/24/18 0818  BP: 135/73   Pulse: 86   Resp: 12   Temp: 99.3 F (37.4 C)   SpO2: 100% 98%    Physical Exam: Awake alert oriented Nonlabored respirations Right chest catheter without hematoma Palpable left upper arm thrill with minimal pulsatility Palpable left radial pulse Left hand is sensorimotor intact  CBC    Component Value Date/Time   WBC 12.3 (H) 07/23/2018 0416   RBC 3.31 (L) 07/23/2018 0416   HGB 9.6 (L) 07/23/2018 0416   HCT 29.3 (L) 07/23/2018 0416   PLT 253 07/23/2018 0416   MCV 88.5 07/23/2018 0416   MCH 29.0 07/23/2018 0416   MCHC 32.8 07/23/2018 0416   RDW 16.2 (H) 07/23/2018 0416   LYMPHSABS 1.6 07/22/2018 0445   MONOABS 1.2 (H) 07/22/2018 0445   EOSABS 0.3 07/22/2018 0445   BASOSABS 0.0 07/22/2018 0445    BMET    Component Value Date/Time   NA 136 07/24/2018 0508   K 4.3 07/24/2018 0508   CL 102 07/24/2018 0508   CO2 22 07/24/2018 0508   GLUCOSE 115 (H) 07/24/2018 0508   BUN 53 (H) 07/24/2018 0508   CREATININE 4.11 (H) 07/24/2018 0508   CALCIUM 10.2 07/24/2018 0508   GFRNONAA 10 (L) 07/24/2018 0508   GFRAA 11 (L) 07/24/2018 0508    INR    Component Value Date/Time   INR 1.07 07/23/2018 0416     Intake/Output Summary (Last 24 hours) at 07/24/2018 1321 Last data filed at 07/23/2018 2135 Gross per 24 hour  Intake -  Output 975 ml  Net -975 ml     Assessment:  83 y.o. female is status post placement right IJ tunneled dialysis catheter creation left upper arm fistula doing well.  Plan: Catheter okay for use She will follow-up in 4 to 6 weeks with dialysis duplex of her left upper extremity.   Onyx Edgley C. Donzetta Matters, MD [facility] Office: [phone] Pager: [phone]  07/24/2018 1:21 PM

## 2018-07-24 NOTE — Progress Notes (Signed)
PT Cancellation Note  Patient Details Name: Mallory Chavez MRN: [id number] DOB: March 16, 1936   Cancelled Treatment:    Reason Eval/Treat Not Completed: Patient at procedure or test/unavailable(pt leaving room to go to HD.  Will follow. )   Philomena Doheny PT 07/24/2018  Acute Rehabilitation Services Pager [phone] Office [phone]

## 2018-07-25 LAB — RENAL FUNCTION PANEL
Albumin: 3 g/dL — ABNORMAL LOW (ref 3.5–5.0)
Anion gap: 11 (ref 5–15)
BUN: 37 mg/dL — ABNORMAL HIGH (ref 8–23)
CO2: 24 mmol/L (ref 22–32)
Calcium: 10.3 mg/dL (ref 8.9–10.3)
Chloride: 102 mmol/L (ref 98–111)
Creatinine, Ser: 3.86 mg/dL — ABNORMAL HIGH (ref 0.44–1.00)
GFR calc Af Amer: 12 mL/min — ABNORMAL LOW (ref >60)
GFR calc non Af Amer: 10 mL/min — ABNORMAL LOW (ref >60)
GLUCOSE: 94 mg/dL (ref 70–99)
Phosphorus: 5.2 mg/dL — ABNORMAL HIGH (ref 2.5–4.6)
Potassium: 3.9 mmol/L (ref 3.5–5.1)
SODIUM: 137 mmol/L (ref 135–145)

## 2018-07-25 LAB — CBC
HCT: 29.4 % — ABNORMAL LOW (ref 36.0–46.0)
Hemoglobin: 9.4 g/dL — ABNORMAL LOW (ref 12.0–15.0)
MCH: 28.8 pg (ref 26.0–34.0)
MCHC: 32 g/dL (ref 30.0–36.0)
MCV: 90.2 fL (ref 80.0–100.0)
Platelets: 233 10*3/uL (ref 150–400)
RBC: 3.26 MIL/uL — ABNORMAL LOW (ref 3.87–5.11)
RDW: 16.3 % — AB (ref 11.5–15.5)
WBC: 11 10*3/uL — ABNORMAL HIGH (ref 4.0–10.5)
nRBC: 0 % (ref 0.0–0.2)

## 2018-07-25 MED ORDER — DARBEPOETIN ALFA 200 MCG/0.4ML IJ SOSY
200.0000 ug | PREFILLED_SYRINGE | INTRAMUSCULAR | Status: DC
  Administered 2018-07-28: 200 ug via INTRAVENOUS
  Filled 2018-07-25: qty 0.4

## 2018-07-25 MED ORDER — ORAL CARE MOUTH RINSE
15.0000 mL | Freq: Two times a day (BID) | OROMUCOSAL | Status: DC
  Administered 2018-07-25 – 2018-07-27 (×5): 15 mL via OROMUCOSAL

## 2018-07-25 NOTE — Care Management Important Message (Signed)
Important Message  Patient Details  Name: Mallory Chavez MRN: [id number] Date of Birth: 1935-11-20   Medicare Important Message Given:  Yes    Khloie Hamada P [place] 07/25/2018, 11:26 AM

## 2018-07-25 NOTE — Progress Notes (Signed)
Patient ID: Mallory Chavez, female   DOB: 10-28-1935, 83 y.o.   MRN: [id number] [facility] Progress Note   Assessment/ Plan:   1.  Progressive chronic kidney disease now end-stage renal disease: Started on hemodialysis for development of progressive uremic symptoms and volume overload, today will be her third dialysis treatment via right IJ [facility] and she is status post left BCF placement.  Awaiting outpatient dialysis unit placement. 2.  Hypertension: Blood pressures slightly elevated, monitor with ongoing diuretics and thereafter HD/UF. 3.  Anemia of chronic kidney disease: Iron saturation in acceptable range but low ferritin, continue ESA to improve hemoglobin/hematocrit status post PRBC.   4.  Secondary hyperparathyroidism: On cinacalcet for PTH suppression, continue calcium acetate for phosphorus binding. 5.  Volume overload/CHF exacerbation: Monitor with ultrafiltration/hemodialysis.  Subjective:   Tolerating hemodialysis without problem and reports that her appetite is slightly better.   Objective:   BP (!) 147/76 (BP Location: Right Arm)   Pulse 90   Temp 98.7 F (37.1 C) (Oral)   Resp 16   Ht 5\' 3"  (1.6 m)   Wt 76 kg   SpO2 97%   BMI 29.68 kg/m   Intake/Output Summary (Last 24 hours) at 07/25/2018 0950 Last data filed at 07/25/2018 0700 Gross per 24 hour  Intake 120 ml  Output 1000 ml  Net -880 ml   Weight change: 0.4 kg  Physical Exam: Gen: Comfortably resting in bed, watching TV CVS: Pulse regular rhythm, normal S1 and S2 without gallop Resp: Diminished breath sounds over bases, no distinct rales.  Right IJ TDC Abd: Soft, obese, nontender Ext: 1-2+ lower extremity edema.  Left BCF with palpable thrill  Imaging: Dg Chest Port 1 View  Result Date: 07/23/2018 CLINICAL DATA:  Status post dialysis catheter insertion. EXAM: PORTABLE CHEST 1 VIEW COMPARISON:  07/19/2018 FINDINGS: Right chest wall dialysis catheter is noted with tip at the cavoatrial  junction. No pneumothorax identified. Mild cardiac enlargement and aortic atherosclerosis. Scarring is identified within both lower lobes. No pleural effusion or edema identified. IMPRESSION: 1. No complications identified status post right chest wall dialysis catheter placement. 2. Cardiac enlargement 3. Bilateral lower lobe scarring. Electronically Signed   By: Kerby Moors M.D.   On: 07/23/2018 12:49   Dg Fluoro Guide Cv Line-no Report  Result Date: 07/23/2018 Fluoroscopy was utilized by the requesting physician.  No radiographic interpretation.    Labs: BMET Recent Labs  Lab 07/19/18 1744 07/20/18 1103 07/21/18 0659 07/22/18 0445 07/23/18 0416 07/24/18 0508 07/25/18 0519  NA 135 136 139 138 137 136 137  K 4.7 4.3 4.2 4.0 3.7 4.3 3.9  CL 108 108 108 108 104 102 102  CO2 15* 16* 18* 19* 19* 22 24  GLUCOSE 145* 108* 101* 107* 104* 115* 94  BUN 85* 79* 76* 76* 85* 53* 37*  CREATININE 5.21* 5.30* 5.49* 5.61* 5.70* 4.11* 3.86*  CALCIUM 9.4 9.7 9.5 9.9 10.3 10.2 10.3  PHOS  --   --  6.7* 6.3* 6.3* 5.4* 5.2*   CBC Recent Labs  Lab 07/21/18 0659 07/22/18 0445 07/23/18 0416 07/24/18 1312 07/25/18 0519  WBC 10.3 11.5* 12.3* 11.4* 11.0*  NEUTROABS 7.7 8.4*  --   --   --   HGB 8.6* 9.1* 9.6* 8.7* 9.4*  HCT 25.2* 28.5* 29.3* 27.4* 29.4*  MCV 88.7 89.9 88.5 89.8 90.2  PLT 205 217 253 224 233    Medications:    . amLODipine  10 mg Oral QPC supper  . calcium  acetate  1,334 mg Oral TID WC  . Chlorhexidine Gluconate Cloth  6 each Topical Q0600  . cinacalcet  30 mg Oral Q breakfast  . darbepoetin (ARANESP) injection - NON-DIALYSIS  200 mcg Subcutaneous Q Tue-1800  . feeding supplement  1 Container Oral TID BM  . heparin injection (subcutaneous)  5,000 Units Subcutaneous Q8H  . mouth rinse  15 mL Mouth Rinse BID  . mometasone-formoterol  2 puff Inhalation BID  . multivitamin  1 tablet Oral QHS  . pantoprazole  40 mg Oral Daily  . sodium chloride flush  3 mL Intravenous Q12H    Elmarie Shiley, MD 07/25/2018, 9:50 AM

## 2018-07-25 NOTE — Procedures (Signed)
Patient seen on Hemodialysis. BP 133/72 (BP Location: Right Arm)   Pulse 88   Temp (!) 97.4 F (36.3 C) (Oral)   Resp 13   Ht 5\' 3"  (1.6 m)   Wt 75.8 kg   SpO2 97%   BMI 29.60 kg/m   QB 300, UF goal 1L Tolerating treatment without complaints at this time.   Elmarie Shiley MD [facility]. Office # [phone] Pager # [phone] 1:49 PM

## 2018-07-25 NOTE — Progress Notes (Signed)
PT Cancellation Note  Patient Details Name: Mallory Chavez MRN: [id number] DOB: [birth year]   Cancelled Treatment:    Reason Eval/Treat Not Completed: Other (comment).  Pt refused over fatigue, asked PT to return at another time.  Will try later as time and pt allow.   Ramond Dial 07/25/2018, 12:32 PM   Mee Hives, PT MS Acute Rehab Dept. Number: [place] and [place]

## 2018-07-25 NOTE — Progress Notes (Signed)
PROGRESS NOTE    Mallory Chavez  OYD:[id number] DOB: 05/02/1936 DOA: 07/19/2018 PCP: [name], MD   Brief Narrative:  History of combined diastolic and systolic congestive heart failure, CKD stage IV presented initially with hypoxic respiratory failure and shortness of breath.  Patient was found to be in decompensated congestive heart failure and eventually worsening renal failure progressed to ESRD requiring hemodialysis.  Vascular surgery was consulted and patient had dialysis catheter placed along with AV fistula on 1/22.  Nephrology was consulted and patient was started on hemodialysis.  During the hospitalization she was also noted to be significantly anemic secondary to her chronic disease but no obvious blood loss was noted.  She was started on IV iron and Aranesp.   Assessment & Plan:   Principal Problem:   Symptomatic anemia Active Problems:   Essential hypertension   COPD GOLD II   Acute renal failure superimposed on stage 4 chronic kidney disease (HCC)   Acute on chronic combined systolic and diastolic CHF (congestive heart failure) ([place])   Palliative care encounter   DNR (do not resuscitate)  Acute hypoxic respiratory failure, improving Acute on chronic combined systolic and diastolic congestive heart failure -She is improved with hemodialysis.  Initially she was refractory to diuretic therapy.  Appreciate cardiology input. - Echocardiogram from 07/21/2018 showed ejection fraction 50% with grade 1 diastolic dysfunction, diffuse hypokinesis noted.  Worsening chronic kidney disease stage V Now end-stage renal disease on hemodialysis - Status post 2 hemodialysis treatment, planned for third treatment today.  Has a right IJ dialysis catheter in place, fistula placed on 1/22.  Appreciate vascular surgery input - Nephrology team following.  Currently clip process in place.  Anemia of chronic disease -No obvious loss of blood loss noted.  Continue Aranesp.  GERD -PPI  daily  Transient hypotension -Norvasc 10 mg orally daily, IV hydralazine PRN as needed  History of COPD -Bronchodilators PRN.  Appears to be stable at this time.  Generalized deconditioning -PT/OT consulted-awaiting their recommendations.   DVT prophylaxis: Subcutaneous heparin Code Status: DO NOT RESUSCITATE Family Communication: None at bedside  disposition Plan: Maintain inpatient stay for evaluation by physical therapy and outpatient arrangements for hemodialysis.  Consultants:   Neurology  Cardiology  Gastroenterology  Vascular surgery  Procedures:   Right brachiocephalic fistula placed on 1/22  Antimicrobials:   Perioperative   Subjective: Patient is any complaints besides feeling generally weak.  Overall she has made some improvement since her hospitalization especially after getting multiple dialysis treatment.  Review of Systems Otherwise negative except as per HPI, including: General: Denies fever, chills, night sweats or unintended weight loss. Resp: Denies cough, wheezing, shortness of breath. Cardiac: Denies chest pain, palpitations, orthopnea, paroxysmal nocturnal dyspnea. GI: Denies abdominal pain, nausea, vomiting, diarrhea or constipation GU: Denies dysuria, frequency, hesitancy or incontinence MS: Denies muscle aches, joint pain or swelling Neuro: Denies headache, neurologic deficits (focal weakness, numbness, tingling), abnormal gait Psych: Denies anxiety, depression, SI/HI/AVH Skin: Denies new rashes or lesions ID: Denies sick contacts, exotic exposures, travel  Objective: Vitals:   07/24/18 2219 07/25/18 0500 07/25/18 0513 07/25/18 0809  BP: (!) 148/56  (!) 147/76   Pulse: (!) 48  90   Resp:      Temp: 98.9 F (37.2 C)  98.7 F (37.1 C)   TempSrc: Oral  Oral   SpO2: 98%  99% 97%  Weight:  76 kg    Height:        Intake/Output Summary (Last 24 hours) at 07/25/2018  [place] filed at 07/25/2018 0700 Gross per 24 hour  Intake  120 ml  Output 1000 ml  Net -880 ml   Filed Weights   07/24/18 1300 07/24/18 1628 07/25/18 0500  Weight: 75.5 kg 74.2 kg 76 kg    Examination:  General exam: Appears calm and comfortable  Respiratory system: Bibasilar crackles. Cardiovascular system: S1 & S2 heard, RRR. No JVD, murmurs, rubs, gallops or clicks. No pedal edema. Gastrointestinal system: Abdomen is nondistended, soft and nontender. No organomegaly or masses felt. Normal bowel sounds heard. Central nervous system: Alert and oriented. No focal neurological deficits. Extremities: Symmetric 4 x 5 power. Skin: No rashes, lesions or ulcers Psychiatry: Judgement and insight appear normal. Mood & affect appropriate.  Right upper extremity fistula site noted-no obvious evidence of infection or bleeding.   Data Reviewed:   CBC: Recent Labs  Lab 07/21/18 0659 07/22/18 0445 07/23/18 0416 07/24/18 1312 07/25/18 0519  WBC 10.3 11.5* 12.3* 11.4* 11.0*  NEUTROABS 7.7 8.4*  --   --   --   HGB 8.6* 9.1* 9.6* 8.7* 9.4*  HCT 25.2* 28.5* 29.3* 27.4* 29.4*  MCV 88.7 89.9 88.5 89.8 90.2  PLT 205 217 253 224 536   Basic Metabolic Panel: Recent Labs  Lab 07/21/18 0659 07/22/18 0445 07/23/18 0416 07/24/18 0508 07/25/18 0519  NA 139 138 137 136 137  K 4.2 4.0 3.7 4.3 3.9  CL 108 108 104 102 102  CO2 18* 19* 19* 22 24  GLUCOSE 101* 107* 104* 115* 94  BUN 76* 76* 85* 53* 37*  CREATININE 5.49* 5.61* 5.70* 4.11* 3.86*  CALCIUM 9.5 9.9 10.3 10.2 10.3  MG 2.0 1.9 1.8  --   --   PHOS 6.7* 6.3* 6.3* 5.4* 5.2*   GFR: Estimated Creatinine Clearance: 11.3 mL/min (A) (by C-G formula based on SCr of 3.86 mg/dL (H)). Liver Function Tests: Recent Labs  Lab 07/19/18 1744 07/21/18 0659 07/22/18 0445 07/23/18 0416 07/24/18 0508 07/25/18 0519  AST 14* 14*  --   --   --   --   ALT 12 11  --   --   --   --   ALKPHOS 57 62  --   --   --   --   BILITOT 0.4 0.6  --   --   --   --   PROT 6.2* 5.8*  --   --   --   --   ALBUMIN 3.7  3.5 3.4* 3.4* 3.4* 3.0*   No results for input(s): LIPASE, AMYLASE in the last 168 hours. No results for input(s): AMMONIA in the last 168 hours. Coagulation Profile: Recent Labs  Lab 07/19/18 1744 07/23/18 0416  INR 1.13 1.07   Cardiac Enzymes: No results for input(s): CKTOTAL, CKMB, CKMBINDEX, TROPONINI in the last 168 hours. BNP (last 3 results) No results for input(s): PROBNP in the last 8760 hours. HbA1C: No results for input(s): HGBA1C in the last 72 hours. CBG: Recent Labs  Lab 07/23/18 1212  GLUCAP 107*   Lipid Profile: No results for input(s): CHOL, HDL, LDLCALC, TRIG, CHOLHDL, LDLDIRECT in the last 72 hours. Thyroid Function Tests: No results for input(s): TSH, T4TOTAL, FREET4, T3FREE, THYROIDAB in the last 72 hours. Anemia Panel: No results for input(s): VITAMINB12, FOLATE, FERRITIN, TIBC, IRON, RETICCTPCT in the last 72 hours. Sepsis Labs: Recent Labs  Lab 07/19/18 1806  LATICACIDVEN 1.14    Recent Results (from the past 240 hour(s))  MRSA PCR Screening     Status:  None   Collection Time: 07/19/18 10:08 PM  Result Value Ref Range Status   MRSA by PCR NEGATIVE NEGATIVE Final    Comment:        The GeneXpert MRSA Assay (FDA approved for NASAL specimens only), is one component of a comprehensive MRSA colonization surveillance program. It is not intended to diagnose MRSA infection nor to guide or monitor treatment for MRSA infections. Performed at [facility], [place] [address] [place], [place] [address]          Radiology Studies: Dg Chest [facility] 1 View  Result Date: 07/23/2018 CLINICAL DATA:  Status post dialysis catheter insertion. EXAM: PORTABLE CHEST 1 VIEW COMPARISON:  07/19/2018 FINDINGS: Right chest wall dialysis catheter is noted with tip at the cavoatrial junction. No pneumothorax identified. Mild cardiac enlargement and aortic atherosclerosis. Scarring is identified within both lower lobes. No pleural effusion or edema identified.  IMPRESSION: 1. No complications identified status post right chest wall dialysis catheter placement. 2. Cardiac enlargement 3. Bilateral lower lobe scarring. Electronically Signed   By: Kerby Moors M.D.   On: 07/23/2018 12:49   Dg Fluoro Guide Cv Line-no Report  Result Date: 07/23/2018 Fluoroscopy was utilized by the requesting physician.  No radiographic interpretation.        Scheduled Meds: . amLODipine  10 mg Oral QPC supper  . calcium acetate  1,334 mg Oral TID WC  . Chlorhexidine Gluconate Cloth  6 each Topical Q0600  . cinacalcet  30 mg Oral Q breakfast  . [START ON 07/28/2018] darbepoetin (ARANESP) injection - DIALYSIS  200 mcg Intravenous Q Mon-HD  . feeding supplement  1 Container Oral TID BM  . heparin injection (subcutaneous)  5,000 Units Subcutaneous Q8H  . mouth rinse  15 mL Mouth Rinse BID  . mometasone-formoterol  2 puff Inhalation BID  . multivitamin  1 tablet Oral QHS  . pantoprazole  40 mg Oral Daily  . sodium chloride flush  3 mL Intravenous Q12H   Continuous Infusions: . sodium chloride    . sodium chloride       LOS: 6 days   Time spent= 25 mins     Arsenio Loader, MD [facility]  If 7PM-7AM, please contact night-coverage [email or web address] 07/25/2018, 10:53 AM

## 2018-07-26 ENCOUNTER — Inpatient Hospital Stay (HOSPITAL_COMMUNITY): Payer: Medicare Other

## 2018-07-26 LAB — RENAL FUNCTION PANEL
Albumin: 2.9 g/dL — ABNORMAL LOW (ref 3.5–5.0)
Anion gap: 13 (ref 5–15)
BUN: 29 mg/dL — ABNORMAL HIGH (ref 8–23)
CO2: 25 mmol/L (ref 22–32)
Calcium: 10.1 mg/dL (ref 8.9–10.3)
Chloride: 97 mmol/L — ABNORMAL LOW (ref 98–111)
Creatinine, Ser: 3.39 mg/dL — ABNORMAL HIGH (ref 0.44–1.00)
GFR calc Af Amer: 14 mL/min — ABNORMAL LOW (ref >60)
GFR calc non Af Amer: 12 mL/min — ABNORMAL LOW (ref >60)
Glucose, Bld: 80 mg/dL (ref 70–99)
PHOSPHORUS: 4.6 mg/dL (ref 2.5–4.6)
Potassium: 3.7 mmol/L (ref 3.5–5.1)
Sodium: 135 mmol/L (ref 135–145)

## 2018-07-26 MED ORDER — DICLOFENAC SODIUM 1 % TD GEL
2.0000 g | Freq: Four times a day (QID) | TRANSDERMAL | Status: DC
  Administered 2018-07-26 – 2018-07-28 (×6): 2 g via TOPICAL
  Filled 2018-07-26: qty 100

## 2018-07-26 MED ORDER — COLCHICINE 0.6 MG PO TABS
0.6000 mg | ORAL_TABLET | ORAL | Status: DC
  Administered 2018-07-26: 0.6 mg via ORAL
  Filled 2018-07-26: qty 1

## 2018-07-26 NOTE — Progress Notes (Signed)
PT Cancellation Note  Patient Details Name: Mallory Chavez MRN: [id number] DOB: 06/06/1936   Cancelled Treatment:    Reason Eval/Treat Not Completed: Patient declined, no reason specified Patient declining participation with PT. PT attempting to educate patient on importance of OOB mobility with patient continuing to decline stating "I can't do it right now, my feet hurt." PT to attempt later in the day as time allows.    Lanney Gins, PT, DPT Supplemental Physical Therapist 07/26/18 8:46 AM Pager: [phone] Office: [phone]

## 2018-07-26 NOTE — Progress Notes (Signed)
Patient ID: Mallory Chavez, female   DOB: Jun 17, 1936, 83 y.o.   MRN: [id number] [facility] Progress Note   Assessment/ Plan:   1.  Progressive chronic kidney disease now end-stage renal disease: Started on hemodialysis for development of progressive uremic symptoms and volume overload, yesterday status post third treatment via right IJ [facility] and she is status post left BCF placement.  Awaiting outpatient dialysis unit placement.  Plan for her next hemodialysis treatment on Monday 2.  Hypertension: Blood pressures slightly elevated, monitor with ongoing diuretics and thereafter HD/UF. 3.  Anemia of chronic kidney disease: Iron saturation in acceptable range but low ferritin, continue ESA to improve hemoglobin/hematocrit status post PRBC.   4.  Secondary hyperparathyroidism: On cinacalcet for PTH suppression, continue calcium acetate for phosphorus binding. 5.  Left ankle pain: Previous history of gouty arthritis and as needed use of colchicine- will reorder.  Subjective:   Complains of some left ankle pain that is limiting weightbearing and ambulation.   Objective:   BP 130/85 (BP Location: Right Arm)   Pulse 90   Temp 98.1 F (36.7 C) (Oral)   Resp 18   Ht 5\' 3"  (1.6 m)   Wt 75.3 kg   SpO2 99%   BMI 29.41 kg/m   Intake/Output Summary (Last 24 hours) at 07/26/2018 0937 Last data filed at 07/26/2018 1025 Gross per 24 hour  Intake 243 ml  Output 1315 ml  Net -1072 ml   Weight change: 0.3 kg  Physical Exam: Gen: Comfortably sitting in recliner CVS: Pulse regular rhythm, normal S1 and S2 without gallop Resp: Diminished breath sounds over bases, no distinct rales.  Right IJ TDC Abd: Soft, obese, nontender Ext: No lower extremity edema.  Left BCF with palpable thrill  Imaging: No results found.  Labs: BMET Recent Labs  Lab 07/20/18 1103 07/21/18 0659 07/22/18 0445 07/23/18 0416 07/24/18 0508 07/25/18 0519 07/26/18 0328  NA 136 139 138 137 136 137 135  K  4.3 4.2 4.0 3.7 4.3 3.9 3.7  CL 108 108 108 104 102 102 97*  CO2 16* 18* 19* 19* 22 24 25   GLUCOSE 108* 101* 107* 104* 115* 94 80  BUN 79* 76* 76* 85* 53* 37* 29*  CREATININE 5.30* 5.49* 5.61* 5.70* 4.11* 3.86* 3.39*  CALCIUM 9.7 9.5 9.9 10.3 10.2 10.3 10.1  PHOS  --  6.7* 6.3* 6.3* 5.4* 5.2* 4.6   CBC Recent Labs  Lab 07/21/18 0659 07/22/18 0445 07/23/18 0416 07/24/18 1312 07/25/18 0519  WBC 10.3 11.5* 12.3* 11.4* 11.0*  NEUTROABS 7.7 8.4*  --   --   --   HGB 8.6* 9.1* 9.6* 8.7* 9.4*  HCT 25.2* 28.5* 29.3* 27.4* 29.4*  MCV 88.7 89.9 88.5 89.8 90.2  PLT 205 217 253 224 233    Medications:    . amLODipine  10 mg Oral QPC supper  . calcium acetate  1,334 mg Oral TID WC  . Chlorhexidine Gluconate Cloth  6 each Topical Q0600  . cinacalcet  30 mg Oral Q breakfast  . [START ON 07/28/2018] darbepoetin (ARANESP) injection - DIALYSIS  200 mcg Intravenous Q Mon-HD  . feeding supplement  1 Container Oral TID BM  . heparin injection (subcutaneous)  5,000 Units Subcutaneous Q8H  . mouth rinse  15 mL Mouth Rinse BID  . mometasone-formoterol  2 puff Inhalation BID  . multivitamin  1 tablet Oral QHS  . pantoprazole  40 mg Oral Daily  . sodium chloride flush  3 mL Intravenous Q12H  Elmarie Shiley, MD 07/26/2018, 9:37 AM

## 2018-07-26 NOTE — Progress Notes (Signed)
PROGRESS NOTE    Mallory Chavez  PXT:[id number] DOB: [birth year] DOA: 07/19/2018 PCP: [name], MD   Brief Narrative:  History of combined diastolic and systolic congestive heart failure, CKD stage IV presented initially with hypoxic respiratory failure and shortness of breath.  Patient was found to be in decompensated congestive heart failure and eventually worsening renal failure progressed to ESRD requiring hemodialysis.  Vascular surgery was consulted and patient had dialysis catheter placed along with AV fistula on 1/22.  Nephrology was consulted and patient was started on hemodialysis.  During the hospitalization she was also noted to be significantly anemic secondary to her chronic disease but no obvious blood loss was noted.  She was started on IV iron and Aranesp.  Assessment & Plan:   Principal Problem:   Symptomatic anemia Active Problems:   Essential hypertension   COPD GOLD II   Acute renal failure superimposed on stage 4 chronic kidney disease (HCC)   Acute on chronic combined systolic and diastolic CHF (congestive heart failure) ([place])   Palliative care encounter   DNR (do not resuscitate)   Acute hypoxic respiratory failure, improving Acute on chronic combined systolic and diastolic congestive heart failure -She is improved with hemodialysis.  Initially she was refractory to diuretic therapy.  Appreciate cardiology input (now signed off). - Echocardiogram from 07/21/2018 showed ejection fraction 50% with grade 1 diastolic dysfunction, diffuse hypokinesis noted.  Worsening chronic kidney disease stage V Now end-stage renal disease on hemodialysis - Status post 3 hemodialysis treatments - Next HD on Monday.   - Has Jet T[place] right IJ dialysis catheter in place, fistula placed on 1/22.  Appreciate vascular surgery input - Nephrology team following.  Currently clip process in place.  Right Heel Pain - she has hx of gout, but this seems Avin Gibbons bit atypical - her colchicine  has been restarted, q 3 days - imaging with changes c/w gout at first MTP joint, but as noted above, her pain today is at heel, continue to monitor with colchicine  Anemia of chronic disease -No obvious loss of blood loss noted.  Continue Aranesp. - s/p 3 units pRBC - Will need outpatient GI follow up  GERD -PPI daily  Transient hypotension -Norvasc 10 mg orally daily  History of COPD -Bronchodilators PRN.  Appears to be stable at this time.  Generalized deconditioning -PT/OT consulted-awaiting their recommendations.  DVT prophylaxis: heparin  Code Status: DNR Family Communication: none at bedside Disposition Plan: (specify when and where you expect patient to be discharged). Include barriers to DC in this tab.   Consultants:   Cardiology  Vascular  Nephrology  Gastroenterology  Procedures:   Left brachiocephalic AV fistula creation  R IJ tunneled dialysis catheter  Antimicrobials: Anti-infectives (From admission, onward)   Start     Dose/Rate Route Frequency Ordered Stop   07/23/18 0600  cefUROXime (ZINACEF) 1.5 g in sodium chloride 0.9 % 100 mL IVPB     1.5 g 200 mL/hr over 30 Minutes Intravenous On call to O.R. 07/22/18 6270 07/24/18 0559     Subjective: C/o R heel pain Different than gouty pain.  Objective: Vitals:   07/26/18 0500 07/26/18 0503 07/26/18 1428 07/26/18 1658  BP:  130/85 (!) 124/53 (!) 157/78  Pulse:  90 69   Resp:  18 16   Temp:  98.1 F (36.7 C) 97.9 F (36.6 C)   TempSrc:  Oral Oral   SpO2:  99% 93%   Weight: 75.3 kg     Height:  Intake/Output Summary (Last 24 hours) at 07/26/2018 1746 Last data filed at 07/26/2018 0900 Gross per 24 hour  Intake 463 ml  Output 250 ml  Net 213 ml   Filed Weights   07/25/18 1309 07/25/18 1650 07/26/18 0500  Weight: 75.8 kg 74.8 kg 75.3 kg    Examination:  General exam: Appears calm and comfortable  Respiratory system: Clear to auscultation. Respiratory effort  normal. Cardiovascular system: S1 & S2 heard, RRR Gastrointestinal system: Abdomen is nondistended, soft and nontender.  Central nervous system: Alert and oriented. No focal neurological deficits. Extremities: TTP to R heel. Skin: No rashes, lesions or ulcers Psychiatry: Judgement and insight appear normal. Mood & affect appropriate.     Data Reviewed: I have personally reviewed following labs and imaging studies  CBC: Recent Labs  Lab 07/21/18 0659 07/22/18 0445 07/23/18 0416 07/24/18 1312 07/25/18 0519  WBC 10.3 11.5* 12.3* 11.4* 11.0*  NEUTROABS 7.7 8.4*  --   --   --   HGB 8.6* 9.1* 9.6* 8.7* 9.4*  HCT 25.2* 28.5* 29.3* 27.4* 29.4*  MCV 88.7 89.9 88.5 89.8 90.2  PLT 205 217 253 224 017   Basic Metabolic Panel: Recent Labs  Lab 07/21/18 0659 07/22/18 0445 07/23/18 0416 07/24/18 0508 07/25/18 0519 07/26/18 0328  NA 139 138 137 136 137 135  K 4.2 4.0 3.7 4.3 3.9 3.7  CL 108 108 104 102 102 97*  CO2 18* 19* 19* 22 24 25   GLUCOSE 101* 107* 104* 115* 94 80  BUN 76* 76* 85* 53* 37* 29*  CREATININE 5.49* 5.61* 5.70* 4.11* 3.86* 3.39*  CALCIUM 9.5 9.9 10.3 10.2 10.3 10.1  MG 2.0 1.9 1.8  --   --   --   PHOS 6.7* 6.3* 6.3* 5.4* 5.2* 4.6   GFR: Estimated Creatinine Clearance: 12.9 mL/min (Dare Spillman) (by C-G formula based on SCr of 3.39 mg/dL (H)). Liver Function Tests: Recent Labs  Lab 07/21/18 0659 07/22/18 0445 07/23/18 0416 07/24/18 0508 07/25/18 0519 07/26/18 0328  AST 14*  --   --   --   --   --   ALT 11  --   --   --   --   --   ALKPHOS 62  --   --   --   --   --   BILITOT 0.6  --   --   --   --   --   PROT 5.8*  --   --   --   --   --   ALBUMIN 3.5 3.4* 3.4* 3.4* 3.0* 2.9*   No results for input(s): LIPASE, AMYLASE in the last 168 hours. No results for input(s): AMMONIA in the last 168 hours. Coagulation Profile: Recent Labs  Lab 07/23/18 0416  INR 1.07   Cardiac Enzymes: No results for input(s): CKTOTAL, CKMB, CKMBINDEX, TROPONINI in the last 168  hours. BNP (last 3 results) No results for input(s): PROBNP in the last 8760 hours. HbA1C: No results for input(s): HGBA1C in the last 72 hours. CBG: Recent Labs  Lab 07/23/18 1212  GLUCAP 107*   Lipid Profile: No results for input(s): CHOL, HDL, LDLCALC, TRIG, CHOLHDL, LDLDIRECT in the last 72 hours. Thyroid Function Tests: No results for input(s): TSH, T4TOTAL, FREET4, T3FREE, THYROIDAB in the last 72 hours. Anemia Panel: No results for input(s): VITAMINB12, FOLATE, FERRITIN, TIBC, IRON, RETICCTPCT in the last 72 hours. Sepsis Labs: Recent Labs  Lab 07/19/18 1806  LATICACIDVEN 1.14    Recent Results (from the past 240  hour(s))  MRSA PCR Screening     Status: None   Collection Time: 07/19/18 10:08 PM  Result Value Ref Range Status   MRSA by PCR NEGATIVE NEGATIVE Final    Comment:        The GeneXpert MRSA Assay (FDA approved for NASAL specimens only), is one component of Jahid Weida comprehensive MRSA colonization surveillance program. It is not intended to diagnose MRSA infection nor to guide or monitor treatment for MRSA infections. Performed at [facility], [place] [address], [place], [place] [address]          Radiology Studies: Dg Foot Complete Left  Result Date: 07/26/2018 CLINICAL DATA:  Pain in heel for about one week, h/o gout. No injury. EXAM: LEFT FOOT - COMPLETE 3+ VIEW COMPARISON:  None. FINDINGS: Chronic erosive changes about the first MTP joint compatible with the given history of gout. Remainder of the joint spaces throughout the LEFT foot appear well maintained. No acute appearing osseous abnormality. Adjacent soft tissues are unremarkable. IMPRESSION: 1. No acute findings. 2. Chronic erosive changes at the first MTP joint, compatible with sequela of gout. Electronically Signed   By: Franki Cabot M.D.   On: 07/26/2018 11:41        Scheduled Meds: . amLODipine  10 mg Oral QPC supper  . calcium acetate  1,334 mg Oral TID WC  . Chlorhexidine  Gluconate Cloth  6 each Topical Q0600  . cinacalcet  30 mg Oral Q breakfast  . colchicine  0.6 mg Oral Q72H  . [START ON 07/28/2018] darbepoetin (ARANESP) injection - DIALYSIS  200 mcg Intravenous Q Mon-HD  . feeding supplement  1 Container Oral TID BM  . heparin injection (subcutaneous)  5,000 Units Subcutaneous Q8H  . mouth rinse  15 mL Mouth Rinse BID  . mometasone-formoterol  2 puff Inhalation BID  . multivitamin  1 tablet Oral QHS  . pantoprazole  40 mg Oral Daily  . sodium chloride flush  3 mL Intravenous Q12H   Continuous Infusions: . sodium chloride    . sodium chloride       LOS: 7 days    Time spent: over 30 min    Fayrene Helper, MD [facility] Pager AMION  If 7PM-7AM, please contact night-coverage [email or web address] Password [facility] 07/26/2018, 5:46 PM

## 2018-07-27 LAB — CBC
HCT: 27.6 % — ABNORMAL LOW (ref 36.0–46.0)
Hemoglobin: 9 g/dL — ABNORMAL LOW (ref 12.0–15.0)
MCH: 29.3 pg (ref 26.0–34.0)
MCHC: 32.6 g/dL (ref 30.0–36.0)
MCV: 89.9 fL (ref 80.0–100.0)
Platelets: 211 10*3/uL (ref 150–400)
RBC: 3.07 MIL/uL — ABNORMAL LOW (ref 3.87–5.11)
RDW: 15.6 % — ABNORMAL HIGH (ref 11.5–15.5)
WBC: 13.3 10*3/uL — ABNORMAL HIGH (ref 4.0–10.5)
nRBC: 0 % (ref 0.0–0.2)

## 2018-07-27 LAB — RENAL FUNCTION PANEL
Albumin: 3 g/dL — ABNORMAL LOW (ref 3.5–5.0)
Anion gap: 13 (ref 5–15)
BUN: 47 mg/dL — ABNORMAL HIGH (ref 8–23)
CO2: 25 mmol/L (ref 22–32)
Calcium: 10.5 mg/dL — ABNORMAL HIGH (ref 8.9–10.3)
Chloride: 95 mmol/L — ABNORMAL LOW (ref 98–111)
Creatinine, Ser: 4.73 mg/dL — ABNORMAL HIGH (ref 0.44–1.00)
GFR calc Af Amer: 9 mL/min — ABNORMAL LOW (ref >60)
GFR calc non Af Amer: 8 mL/min — ABNORMAL LOW (ref >60)
GLUCOSE: 107 mg/dL — AB (ref 70–99)
PHOSPHORUS: 5.4 mg/dL — AB (ref 2.5–4.6)
Potassium: 3.8 mmol/L (ref 3.5–5.1)
Sodium: 133 mmol/L — ABNORMAL LOW (ref 135–145)

## 2018-07-27 MED ORDER — PREDNISONE 50 MG PO TABS
50.0000 mg | ORAL_TABLET | Freq: Every day | ORAL | Status: DC
  Administered 2018-07-27: 50 mg via ORAL
  Filled 2018-07-27: qty 1

## 2018-07-27 NOTE — Progress Notes (Signed)
Patient ID: Mallory Chavez, female   DOB: 1935-10-31, 83 y.o.   MRN: [id number] [facility] Progress Note   Assessment/ Plan:   1.  Progressive chronic kidney disease now end-stage renal disease: Started on hemodialysis via right IJ TDC and she is status post left BCF placement.  I have ordered for hemodialysis again tomorrow as we await outpatient dialysis unit placement. 2.  Hypertension: Blood pressures improving with hemodialysis/UF and status post pain management. 3.  Anemia of chronic kidney disease: Iron saturation in acceptable range but low ferritin, continue ESA to improve hemoglobin/hematocrit status post PRBC.   4.  Secondary hyperparathyroidism: On cinacalcet for PTH suppression, continue calcium acetate for phosphorus binding. 5.  Left ankle pain: Suspected to be from acute gout, monitor on prednisone/colchicine.  Subjective:   Reports some improvement of her ankle pain overnight.   Objective:   BP 139/62 (BP Location: Right Arm)   Pulse 87   Temp 97.8 F (36.6 C) (Oral)   Resp 16   Ht 5\' 3"  (1.6 m)   Wt 75.3 kg   SpO2 99%   BMI 29.41 kg/m  No intake or output data in the 24 hours ending 07/27/18 0959 Weight change:   Physical Exam: Gen: Comfortably resting in bed, watching television CVS: Pulse regular rhythm, normal S1 and S2 without gallop Resp: Diminished breath sounds over bases, no distinct rales.  Right IJ TDC Abd: Soft, obese, nontender Ext: No lower extremity edema.  Left BCF with palpable thrill  Imaging: Dg Foot Complete Left  Result Date: 07/26/2018 CLINICAL DATA:  Pain in heel for about one week, h/o gout. No injury. EXAM: LEFT FOOT - COMPLETE 3+ VIEW COMPARISON:  None. FINDINGS: Chronic erosive changes about the first MTP joint compatible with the given history of gout. Remainder of the joint spaces throughout the LEFT foot appear well maintained. No acute appearing osseous abnormality. Adjacent soft tissues are unremarkable.  IMPRESSION: 1. No acute findings. 2. Chronic erosive changes at the first MTP joint, compatible with sequela of gout. Electronically Signed   By: Franki Cabot M.D.   On: 07/26/2018 11:41    Labs: BMET Recent Labs  Lab 07/21/18 0659 07/22/18 0445 07/23/18 0416 07/24/18 0508 07/25/18 0519 07/26/18 0328 07/27/18 0347  NA 139 138 137 136 137 135 133*  K 4.2 4.0 3.7 4.3 3.9 3.7 3.8  CL 108 108 104 102 102 97* 95*  CO2 18* 19* 19* 22 24 25 25   GLUCOSE 101* 107* 104* 115* 94 80 107*  BUN 76* 76* 85* 53* 37* 29* 47*  CREATININE 5.49* 5.61* 5.70* 4.11* 3.86* 3.39* 4.73*  CALCIUM 9.5 9.9 10.3 10.2 10.3 10.1 10.5*  PHOS 6.7* 6.3* 6.3* 5.4* 5.2* 4.6 5.4*   CBC Recent Labs  Lab 07/21/18 0659 07/22/18 0445 07/23/18 0416 07/24/18 1312 07/25/18 0519 07/27/18 0347  WBC 10.3 11.5* 12.3* 11.4* 11.0* 13.3*  NEUTROABS 7.7 8.4*  --   --   --   --   HGB 8.6* 9.1* 9.6* 8.7* 9.4* 9.0*  HCT 25.2* 28.5* 29.3* 27.4* 29.4* 27.6*  MCV 88.7 89.9 88.5 89.8 90.2 89.9  PLT 205 217 253 224 233 211    Medications:    . amLODipine  10 mg Oral QPC supper  . calcium acetate  1,334 mg Oral TID WC  . Chlorhexidine Gluconate Cloth  6 each Topical Q0600  . cinacalcet  30 mg Oral Q breakfast  . colchicine  0.6 mg Oral Q72H  . [START ON 07/28/2018]  darbepoetin (ARANESP) injection - DIALYSIS  200 mcg Intravenous Q Mon-HD  . diclofenac sodium  2 g Topical QID  . feeding supplement  1 Container Oral TID BM  . heparin injection (subcutaneous)  5,000 Units Subcutaneous Q8H  . mouth rinse  15 mL Mouth Rinse BID  . mometasone-formoterol  2 puff Inhalation BID  . multivitamin  1 tablet Oral QHS  . pantoprazole  40 mg Oral Daily  . predniSONE  50 mg Oral Q breakfast  . sodium chloride flush  3 mL Intravenous Q12H   Elmarie Shiley, MD 07/27/2018, 9:59 AM

## 2018-07-27 NOTE — Progress Notes (Signed)
PROGRESS NOTE        PATIENT DETAILS Name: Mallory Chavez Age: 83 y.o. Sex: female Date of Birth: 1935-11-01 Admit Date: 07/19/2018 Admitting Physician Vianne Bulls, MD [facility]:QPRFF, Annie Main, MD  Brief Narrative: Patient is a 83 y.o. female with history of chronic diastolic and systolic heart failure, CKD stage IV, with shortness of breath-was found to have acute hypoxic respiratory failure in the setting of decompensated heart failure with worsening renal function.  Point-of-care, diuretics-followed closely by cardiology/nephrology-now thought to have ESRD.  See below for further details.  Subjective: Complaining of right heel pain-she claims this is very similar to her usual pain of gout.  Assessment/Plan: Acute hypoxic respiratory failure: Secondary to decompensated combined systolic and diastolic heart failure in the setting of worsening renal function.  She initially was on BiPAP-but now on just 2 L of nasal cannula.  Continue to titrate off oxygen.   Acute on chronic combined systolic and diastolic heart failure: Volume status is improved after she was started on hemodialysis.  She was initially refractory to 2 diuretic therapy.  Acute kidney injury on CKD stage IV-now with ESRD: Nephrology following and directing HD care.  And now has been started on HD-nephrology following.  Was evaluated by [facility]-underwent tunneled dialysis catheter placement and left arm brachiocephalic AV fistula on 6/38.  Left heel pain: Per patient this is very reminiscent of her gout flare-start prednisone for a few days.  Follow response  Anemia: Hemoglobin of 4.3 on admission-no overt bleeding-suspicion for worsening CKD as cause of anemia.  Did require total of 3 units of PRBC this admission.  Hemoglobin currently stable with darbepoetin and IV iron dose per nephrology.  Prior MD (Dr Starla Link) discussed with GI MD (Dr. Edison Nasuti) on 1/19-given tenuous respiratory/renal status-GI deferred  any further endoscopic evaluation at this time.  Since there is no overt bleeding-hemoglobin is now stable over the past few days-suspect this can be pursued in the outpatient setting.    Hypertension: Controlled-continue amlodipine.   COPD: Stable without any signs of exacerbation-continue bronchodilators  GERD: Continue PPI  Deconditioning/debility: Await PT evaluation  DVT Prophylaxis: Prophylactic Heparin   Code Status:  DNR  Family Communication: None at bedside  Disposition Plan: Remain inpatient-home when outpatient HD chair is available.  Has refused PT evaluation numerous times-probably will require home health services on discharge.  Antimicrobial agents: Anti-infectives (From admission, onward)   Start     Dose/Rate Route Frequency Ordered Stop   07/23/18 0600  cefUROXime (ZINACEF) 1.5 g in sodium chloride 0.9 % 100 mL IVPB     1.5 g 200 mL/hr over 30 Minutes Intravenous On call to O.R. 07/22/18 4665 07/24/18 0559      Procedures: None  CONSULTS:  cardiology, GI and vascular surgery  Time spent: 25- minutes-Greater than 50% of this time was spent in counseling, explanation of diagnosis, planning of further management, and coordination of care.  MEDICATIONS: Scheduled Meds: . amLODipine  10 mg Oral QPC supper  . calcium acetate  1,334 mg Oral TID WC  . Chlorhexidine Gluconate Cloth  6 each Topical Q0600  . cinacalcet  30 mg Oral Q breakfast  . colchicine  0.6 mg Oral Q72H  . [START ON 07/28/2018] darbepoetin (ARANESP) injection - DIALYSIS  200 mcg Intravenous Q Mon-HD  . diclofenac sodium  2 g Topical QID  . feeding supplement  1 Container Oral TID BM  . heparin injection (subcutaneous)  5,000 Units Subcutaneous Q8H  . mouth rinse  15 mL Mouth Rinse BID  . mometasone-formoterol  2 puff Inhalation BID  . multivitamin  1 tablet Oral QHS  . pantoprazole  40 mg Oral Daily  . predniSONE  50 mg Oral Q breakfast  . sodium chloride flush  3 mL Intravenous Q12H    Continuous Infusions: . sodium chloride    . sodium chloride     PRN Meds:.sodium chloride, acetaminophen, heparin, HYDROcodone-acetaminophen, methocarbamol, ondansetron (ZOFRAN) IV, sodium chloride flush   PHYSICAL EXAM: Vital signs: Vitals:   07/26/18 1934 07/26/18 2022 07/27/18 0510 07/27/18 0831  BP:  (!) 164/55 139/62   Pulse:  (!) 102 87   Resp:  18 16   Temp:  98.2 F (36.8 C) 97.8 F (36.6 C)   TempSrc:  Oral Oral   SpO2: 97% 99% 99% 99%  Weight:      Height:       Filed Weights   07/25/18 1309 07/25/18 1650 07/26/18 0500  Weight: 75.8 kg 74.8 kg 75.3 kg   Body mass index is 29.41 kg/m.   General appearance:Awake, alert, not in any distress.  Eyes:no scleral icterus. HEENT: Atraumatic and Normocephalic Neck: supple, no JVD. Resp:Good air entry bilaterally,no rales or rhonchi CVS: S1 S2 regular, no murmurs.  GI: Bowel sounds present, Non tender and not distended with no gaurding, rigidity or rebound. Extremities: B/L Lower Ext shows no edema, both legs are warm to touch.  Left heel somewhat tender but no overlying erythema/swelling Neurology:  Non focal Psychiatric: Normal judgment and insight. Normal mood. Musculoskeletal:No digital cyanosis Skin:No Rash, warm and dry Wounds:N/A  I have personally reviewed following labs and imaging studies  LABORATORY DATA: CBC: Recent Labs  Lab 07/21/18 0659 07/22/18 0445 07/23/18 0416 07/24/18 1312 07/25/18 0519 07/27/18 0347  WBC 10.3 11.5* 12.3* 11.4* 11.0* 13.3*  NEUTROABS 7.7 8.4*  --   --   --   --   HGB 8.6* 9.1* 9.6* 8.7* 9.4* 9.0*  HCT 25.2* 28.5* 29.3* 27.4* 29.4* 27.6*  MCV 88.7 89.9 88.5 89.8 90.2 89.9  PLT 205 217 253 224 233 940    Basic Metabolic Panel: Recent Labs  Lab 07/21/18 0659 07/22/18 0445 07/23/18 0416 07/24/18 0508 07/25/18 0519 07/26/18 0328 07/27/18 0347  NA 139 138 137 136 137 135 133*  K 4.2 4.0 3.7 4.3 3.9 3.7 3.8  CL 108 108 104 102 102 97* 95*  CO2 18* 19* 19* 22  24 25 25   GLUCOSE 101* 107* 104* 115* 94 80 107*  BUN 76* 76* 85* 53* 37* 29* 47*  CREATININE 5.49* 5.61* 5.70* 4.11* 3.86* 3.39* 4.73*  CALCIUM 9.5 9.9 10.3 10.2 10.3 10.1 10.5*  MG 2.0 1.9 1.8  --   --   --   --   PHOS 6.7* 6.3* 6.3* 5.4* 5.2* 4.6 5.4*    GFR: Estimated Creatinine Clearance: 9.2 mL/min (A) (by C-G formula based on SCr of 4.73 mg/dL (H)).  Liver Function Tests: Recent Labs  Lab 07/21/18 7680  07/23/18 0416 07/24/18 0508 07/25/18 0519 07/26/18 0328 07/27/18 0347  AST 14*  --   --   --   --   --   --   ALT 11  --   --   --   --   --   --   ALKPHOS 62  --   --   --   --   --   --  BILITOT 0.6  --   --   --   --   --   --   PROT 5.8*  --   --   --   --   --   --   ALBUMIN 3.5   < > 3.4* 3.4* 3.0* 2.9* 3.0*   < > = values in this interval not displayed.   No results for input(s): LIPASE, AMYLASE in the last 168 hours. No results for input(s): AMMONIA in the last 168 hours.  Coagulation Profile: Recent Labs  Lab 07/23/18 0416  INR 1.07    Cardiac Enzymes: No results for input(s): CKTOTAL, CKMB, CKMBINDEX, TROPONINI in the last 168 hours.  BNP (last 3 results) No results for input(s): PROBNP in the last 8760 hours.  HbA1C: No results for input(s): HGBA1C in the last 72 hours.  CBG: Recent Labs  Lab 07/23/18 1212  GLUCAP 107*    Lipid Profile: No results for input(s): CHOL, HDL, LDLCALC, TRIG, CHOLHDL, LDLDIRECT in the last 72 hours.  Thyroid Function Tests: No results for input(s): TSH, T4TOTAL, FREET4, T3FREE, THYROIDAB in the last 72 hours.  Anemia Panel: No results for input(s): VITAMINB12, FOLATE, FERRITIN, TIBC, IRON, RETICCTPCT in the last 72 hours.  Urine analysis:    Component Value Date/Time   COLORURINE YELLOW 07/02/2016 1058   APPEARANCEUR CLEAR 07/02/2016 1058   LABSPEC 1.010 07/02/2016 1058   PHURINE 6.0 07/02/2016 1058   GLUCOSEU NEGATIVE 07/02/2016 1058   HGBUR NEGATIVE 07/02/2016 1058   BILIRUBINUR NEGATIVE  07/02/2016 1058   [place] 07/02/2016 1058   PROTEINUR 30 (A) 07/02/2016 1058   NITRITE NEGATIVE 07/02/2016 1058   LEUKOCYTESUR SMALL (A) 07/02/2016 1058    Sepsis Labs: Lactic Acid, Venous    Component Value Date/Time   LATICACIDVEN 1.14 07/19/2018 1806    MICROBIOLOGY: Recent Results (from the past 240 hour(s))  MRSA PCR Screening     Status: None   Collection Time: 07/19/18 10:08 PM  Result Value Ref Range Status   MRSA by PCR NEGATIVE NEGATIVE Final    Comment:        The GeneXpert MRSA Assay (FDA approved for NASAL specimens only), is one component of a comprehensive MRSA colonization surveillance program. It is not intended to diagnose MRSA infection nor to guide or monitor treatment for MRSA infections. Performed at [facility], [place] [address] [place], [place] [address]     RADIOLOGY STUDIES/RESULTS: Dg Chest Port 1 View  Result Date: 07/23/2018 CLINICAL DATA:  Status post dialysis catheter insertion. EXAM: PORTABLE CHEST 1 VIEW COMPARISON:  07/19/2018 FINDINGS: Right chest wall dialysis catheter is noted with tip at the cavoatrial junction. No pneumothorax identified. Mild cardiac enlargement and aortic atherosclerosis. Scarring is identified within both lower lobes. No pleural effusion or edema identified. IMPRESSION: 1. No complications identified status post right chest wall dialysis catheter placement. 2. Cardiac enlargement 3. Bilateral lower lobe scarring. Electronically Signed   By: Kerby Moors M.D.   On: 07/23/2018 12:49   Dg Chest Port 1 View  Result Date: 07/19/2018 CLINICAL DATA:  Shortness of breath for 3 days EXAM: PORTABLE CHEST 1 VIEW COMPARISON:  02/06/2018 FINDINGS: Cardiac shadow is enlarged but stable. Lungs are well aerated bilaterally. No focal infiltrate or sizable effusion is seen. No acute bony abnormality is noted. IMPRESSION: Stable cardiomegaly.  No acute abnormality noted. Electronically Signed   By: Inez Catalina M.D.    On: 07/19/2018 18:26   Dg Foot Complete Left  Result Date:  07/26/2018 CLINICAL DATA:  Pain in heel for about one week, h/o gout. No injury. EXAM: LEFT FOOT - COMPLETE 3+ VIEW COMPARISON:  None. FINDINGS: Chronic erosive changes about the first MTP joint compatible with the given history of gout. Remainder of the joint spaces throughout the LEFT foot appear well maintained. No acute appearing osseous abnormality. Adjacent soft tissues are unremarkable. IMPRESSION: 1. No acute findings. 2. Chronic erosive changes at the first MTP joint, compatible with sequela of gout. Electronically Signed   By: Franki Cabot M.D.   On: 07/26/2018 11:41   Dg Fluoro Guide Cv Line-no Report  Result Date: 07/23/2018 Fluoroscopy was utilized by the requesting physician.  No radiographic interpretation.   Vas [place] Upper Ext Vein Mapping (pre-op Avf)  Result Date: 07/21/2018 UPPER EXTREMITY VEIN MAPPING  Indications: Pre-access. Performing Technologist: Maudry Mayhew MHA, RDMS, RVT, RDCS  Examination Guidelines: A complete evaluation includes B-mode imaging, spectral Doppler, color Doppler, and power Doppler as needed of all accessible portions of each vessel. Bilateral testing is considered an integral part of a complete examination. Limited examinations for reoccurring indications may be performed as noted. +-----------------+-------------+----------+--------------+ Right Cephalic   Diameter (cm)Depth (cm)   Findings    +-----------------+-------------+----------+--------------+ Shoulder             0.28        1.00                  +-----------------+-------------+----------+--------------+ Prox upper arm       0.12        0.56                  +-----------------+-------------+----------+--------------+ Mid upper arm        0.15        0.57                  +-----------------+-------------+----------+--------------+ Dist upper arm       0.09        0.49                   +-----------------+-------------+----------+--------------+ Antecubital fossa                       not visualized +-----------------+-------------+----------+--------------+ Prox forearm                            not visualized +-----------------+-------------+----------+--------------+ Mid forearm                             not visualized +-----------------+-------------+----------+--------------+ Dist forearm                            not visualized +-----------------+-------------+----------+--------------+ Wrist                                   not visualized +-----------------+-------------+----------+--------------+ +-----------------+-------------+----------+--------------+ Right Basilic    Diameter (cm)Depth (cm)   Findings    +-----------------+-------------+----------+--------------+ Shoulder                                not visualized +-----------------+-------------+----------+--------------+ Prox upper arm  not visualized +-----------------+-------------+----------+--------------+ Mid upper arm        0.40                              +-----------------+-------------+----------+--------------+ Dist upper arm       0.34                 branching    +-----------------+-------------+----------+--------------+ Antecubital fossa    0.23                              +-----------------+-------------+----------+--------------+ Prox forearm         0.17                              +-----------------+-------------+----------+--------------+ Mid forearm                             not visualized +-----------------+-------------+----------+--------------+ Distal forearm                          not visualized +-----------------+-------------+----------+--------------+ Wrist                                   not visualized +-----------------+-------------+----------+--------------+  +-----------------+-------------+----------+---------------------------------+ Left Cephalic    Diameter (cm)Depth (cm)            Findings              +-----------------+-------------+----------+---------------------------------+ Shoulder             0.29        0.71                                     +-----------------+-------------+----------+---------------------------------+ Prox upper arm       0.31        0.42                                     +-----------------+-------------+----------+---------------------------------+ Mid upper arm        0.32        0.43                                     +-----------------+-------------+----------+---------------------------------+ Dist upper arm       0.28        0.48                                     +-----------------+-------------+----------+---------------------------------+ Antecubital fossa    0.27        0.38                                     +-----------------+-------------+----------+---------------------------------+ Prox forearm         0.17        0.62                                     +-----------------+-------------+----------+---------------------------------+  Mid forearm                             not visualized and IV at location +-----------------+-------------+----------+---------------------------------+ Dist forearm         0.12        0.20                                     +-----------------+-------------+----------+---------------------------------+ Wrist                                            not visualized           +-----------------+-------------+----------+---------------------------------+ +-----------------+-------------+----------+--------------+ Left Basilic     Diameter (cm)Depth (cm)   Findings    +-----------------+-------------+----------+--------------+ Prox upper arm       0.34                               +-----------------+-------------+----------+--------------+ Mid upper arm        0.26                              +-----------------+-------------+----------+--------------+ Dist upper arm       0.31                 branching    +-----------------+-------------+----------+--------------+ Antecubital fossa    0.20                              +-----------------+-------------+----------+--------------+ Prox forearm         0.21                              +-----------------+-------------+----------+--------------+ Mid forearm          0.21                              +-----------------+-------------+----------+--------------+ Distal forearm       0.17                              +-----------------+-------------+----------+--------------+ Wrist                                   not visualized +-----------------+-------------+----------+--------------+ *See table(s) above for measurements and observations.  Diagnosing physician: Ruta Hinds MD Electronically signed by Ruta Hinds MD on 07/21/2018 at 5:25:53 PM.    Final      LOS: 8 days   Oren Binet, MD  [facility]  If 7PM-7AM, please contact night-coverage  Please page via [email or web address]-Password [email or web address]-click on MD name and type text message  07/27/2018, 1:00 PM

## 2018-07-27 NOTE — Evaluation (Signed)
Physical Therapy Evaluation Patient Details Name: Mallory Chavez MRN: [id number] DOB: [birth year] Today's Date: 07/27/2018   History of Present Illness  83 y.o. female with medical history significant for hypertension, chronic combined systolic and diastolic CHF, chronic kidney disease stage IV, and COPD, presented to the emergency department with respiratory distress.  Dx of anemia, acute on chronic CHF.    Clinical Impression  Pt presents with moderate limitations to functional mobility primarily due to acute pain in foot limiting ability to tolerate weight bearing for standing and walking.  Able to perform simple transfers to bedside commode/chair and back to bed unassisted, cannot tolerate walking.  Hopeful medication will resolve pain, and expect once foot pain is managed pt will return to independent mobility.  Can go home with daughter and recommend consider HHPT to address weakness associated with prolonged bedrest.  PT will reassess mobility in next 24-48 hours (as able with HD schedule) and update d/c recommendations as needed.  Pt asking for CSW to assist with [facility] for transportation to/from HD upon dc.  Please order.     Follow Up Recommendations Home health PT    Equipment Recommendations  None recommended by PT    Recommendations for Other Services       Precautions / Restrictions Precautions Precautions: Fall Precaution Comments: up with assist      Mobility  Bed Mobility Overal bed mobility: Modified Independent             General bed mobility comments: with incr time, HOB up, in and out unassisted  Transfers Overall transfer level: Modified independent Equipment used: None(furniture/external support)             General transfer comment: able to pivot to bsc/chair and back and off-load right heel, unable to tolerate standing or walking currently  Ambulation/Gait             General Gait Details: unable due to severe foot pain  right  Stairs            Wheelchair Mobility    Modified Rankin (Stroke Patients Only)       Balance Overall balance assessment: No apparent balance deficits (not formally assessed)                                           Pertinent Vitals/Pain Pain Assessment: 0-10 Pain Score: 7  Pain Location: right foot Pain Intervention(s): Limited activity within patient's tolerance;Monitored during session    Home Living Family/patient expects to be discharged to:: Private residence Living Arrangements: Alone Available Help at Discharge: Available PRN/intermittently Type of Home: House Home Access: Stairs to enter Entrance Stairs-Rails: None Entrance Stairs-Number of Steps: 4 Home Layout: One level Home Equipment: [place] - single point;Walker - 4 wheels;Grab bars - tub/shower      Prior Function Level of Independence: Independent with assistive device(s)         Comments: rollator primarily.  when gout flares, stays at home with feet up     Hand Dominance        Extremity/Trunk Assessment   Upper Extremity Assessment Upper Extremity Assessment: Defer to OT evaluation;Generalized weakness    Lower Extremity Assessment Lower Extremity Assessment: Generalized weakness(right heel pain - gout? waiting for medication to work)       Communication   Communication: No difficulties  Cognition Arousal/Alertness: Awake/alert Behavior During Therapy: [facility] for tasks assessed/performed  Overall Cognitive Status: Within Functional Limits for tasks assessed                                        General Comments General comments (skin integrity, edema, etc.): less than 24 hours into medication regimine to treat foot pain, reassess gait once pain improves    Exercises     Assessment/Plan    PT Assessment Patient needs continued PT services  PT Problem List Pain;Decreased activity tolerance;Decreased mobility;Obesity       PT  Treatment Interventions DME instruction;Gait training;Stair training;Functional mobility training;Therapeutic activities;Patient/family education    PT Goals (Current goals can be found in the Care Plan section)  Acute Rehab PT Goals Patient Stated Goal: no pain PT Goal Formulation: With patient Time For Goal Achievement: 08/10/18 Potential to Achieve Goals: Good    Frequency Min 3X/week   Barriers to discharge   can go home with daughter    Co-evaluation               AM-PAC PT "6 Clicks" Mobility  Outcome Measure Help needed turning from your back to your side while in a flat bed without using bedrails?: None Help needed moving from lying on your back to sitting on the side of a flat bed without using bedrails?: None Help needed moving to and from a bed to a chair (including a wheelchair)?: None Help needed standing up from a chair using your arms (e.g., wheelchair or bedside chair)?: A Little Help needed to walk in hospital room?: A Lot Help needed climbing 3-5 steps with a railing? : Total 6 Click Score: 18    End of Session   Activity Tolerance: Patient limited by pain Patient left: in bed;with call bell/phone within reach;with nursing/sitter in room;with family/visitor present Nurse Communication: Mobility status PT Visit Diagnosis: Difficulty in walking, not elsewhere classified (R26.2);Pain Pain - Right/Left: Right Pain - part of body: Ankle and joints of foot    Time: 6754-4920 PT Time Calculation (min) (ACUTE ONLY): 20 min   Charges:   PT Evaluation $PT Eval Low Complexity: 1 Low         Kearney Hard, PT, DPT, MS Board Certified Geriatric Clinical Specialist  Herbie Drape 07/27/2018, 4:21 PM

## 2018-07-28 LAB — CBC
HCT: 26.6 % — ABNORMAL LOW (ref 36.0–46.0)
Hemoglobin: 8.7 g/dL — ABNORMAL LOW (ref 12.0–15.0)
MCH: 29.5 pg (ref 26.0–34.0)
MCHC: 32.7 g/dL (ref 30.0–36.0)
MCV: 90.2 fL (ref 80.0–100.0)
Platelets: 224 10*3/uL (ref 150–400)
RBC: 2.95 MIL/uL — AB (ref 3.87–5.11)
RDW: 15.3 % (ref 11.5–15.5)
WBC: 14.4 10*3/uL — ABNORMAL HIGH (ref 4.0–10.5)
nRBC: 0 % (ref 0.0–0.2)

## 2018-07-28 LAB — RENAL FUNCTION PANEL
Albumin: 3.1 g/dL — ABNORMAL LOW (ref 3.5–5.0)
Anion gap: 15 (ref 5–15)
BUN: 63 mg/dL — ABNORMAL HIGH (ref 8–23)
CHLORIDE: 93 mmol/L — AB (ref 98–111)
CO2: 22 mmol/L (ref 22–32)
Calcium: 11.1 mg/dL — ABNORMAL HIGH (ref 8.9–10.3)
Creatinine, Ser: 5.15 mg/dL — ABNORMAL HIGH (ref 0.44–1.00)
GFR calc Af Amer: 8 mL/min — ABNORMAL LOW (ref >60)
GFR, EST NON AFRICAN AMERICAN: 7 mL/min — AB (ref >60)
Glucose, Bld: 142 mg/dL — ABNORMAL HIGH (ref 70–99)
Phosphorus: 4.6 mg/dL (ref 2.5–4.6)
Potassium: 4.1 mmol/L (ref 3.5–5.1)
Sodium: 130 mmol/L — ABNORMAL LOW (ref 135–145)

## 2018-07-28 MED ORDER — SEVELAMER CARBONATE 800 MG PO TABS: 2400.0000 mg | ORAL_TABLET | Freq: Three times a day (TID) | ORAL | 0 refills | Status: DC

## 2018-07-28 MED ORDER — PREDNISONE 20 MG PO TABS: 20.0000 mg | ORAL_TABLET | Freq: Every day | ORAL | Status: DC

## 2018-07-28 MED ORDER — HEPARIN SODIUM (PORCINE) 1000 UNIT/ML IJ SOLN
INTRAMUSCULAR | Status: AC
  Administered 2018-07-28: 3000 [IU] via INTRAVENOUS_CENTRAL
  Filled 2018-07-28: qty 3

## 2018-07-28 MED ORDER — COLCHICINE 0.6 MG PO TABS: 0.6000 mg | ORAL_TABLET | ORAL | 0 refills | Status: DC

## 2018-07-28 MED ORDER — DARBEPOETIN ALFA 200 MCG/0.4ML IJ SOSY
PREFILLED_SYRINGE | INTRAMUSCULAR | Status: AC
  Administered 2018-07-28: 200 ug via INTRAVENOUS
  Filled 2018-07-28: qty 0.4

## 2018-07-28 MED ORDER — PREDNISONE 20 MG PO TABS: 20.0000 mg | ORAL_TABLET | Freq: Every day | ORAL | 0 refills | Status: DC

## 2018-07-28 MED ORDER — SEVELAMER CARBONATE 800 MG PO TABS
2400.0000 mg | ORAL_TABLET | Freq: Three times a day (TID) | ORAL | Status: DC
  Administered 2018-07-28: 2400 mg via ORAL
  Filled 2018-07-28 (×2): qty 3

## 2018-07-28 MED ORDER — HEPARIN SODIUM (PORCINE) 1000 UNIT/ML DIALYSIS
40.0000 [IU]/kg | INTRAMUSCULAR | Status: DC | PRN
  Administered 2018-07-28 (×2): 3000 [IU] via INTRAVENOUS_CENTRAL
  Filled 2018-07-28 (×2): qty 3

## 2018-07-28 NOTE — Discharge Instructions (Signed)
AV Fistula Placement, Care After  This sheet gives you information about how to care for yourself after your procedure. Your health care provider may also give you more specific instructions. If you have problems or questions, contact your health care provider.  What can I expect after the procedure?  After the procedure, it is common to:  · Feel sore.  · Feel a vibration (thrill) over the fistula.  Follow these instructions at home:  Incision care         · Follow instructions from your health care provider about how to take care of your incision. Make sure you:  ? Wash your hands with soap and water before and after you change your bandage (dressing). If soap and water are not available, use hand sanitizer.  ? Change your dressing as told by your health care provider.  ? Leave stitches (sutures), skin glue, or adhesive strips in place. These skin closures may need to stay in place for 2 weeks or longer. If adhesive strip edges start to loosen and curl up, you may trim the loose edges. Do not remove adhesive strips completely unless your health care provider tells you to do that.  Fistula care  · Check your fistula site every day to make sure the thrill feels the same.  · Check your fistula site every day for signs of infection. Check for:  ? Redness, swelling, or pain.  ? Fluid or blood.  ? Warmth.  ? Pus or a bad smell.  · Raise (elevate) the affected area above the level of your heart while you are sitting or lying down.  · Do not lift anything that is heavier than 10 lb (4.5 kg), or the limit that you are told, until your health care provider says that it is safe.  · Do not lie down on your fistula arm.  · Do not let anyone draw blood or take a blood pressure reading on your fistula arm. This is important.  · Do not wear tight jewelry or clothing over your fistula arm.  Bathing  · Do not take baths, swim, or use a hot tub until your health care provider approves. Ask your health care provider if you may take  showers. You may only be allowed to take sponge baths.  · Keep the area around your incision clean and dry.  Medicines  · Take over-the-counter and prescription medicines only as told by your health care provider.  · Ask your health care provider if any medicine prescribed to you can cause constipation. You may need to take steps to prevent or treat constipation, such as:  ? Drink enough fluid to keep your urine pale yellow.  ? Take over-the-counter or prescription medicines.  ? Eat foods that are high in fiber, such as beans, whole grains, and fresh fruits and vegetables.  ? Limit foods that are high in fat and processed sugars, such as fried or sweet foods.  General instructions  · Rest at home for a day or two.  · Return to your normal activities as told by your health care provider. Ask your health care provider what activities are safe for you.  · Keep all follow-up visits as told by your health care provider. This is important.  Contact a health care provider if:  · You have redness, swelling, or pain around your fistula site.  · Your fistula site feels warm to the touch.  · You have pus or a bad smell coming from your   pain.  Have trouble breathing. Summary  Follow instructions from your health care provider about how to take care of your incision.  Do not let anyone draw blood or take a blood pressure reading on your fistula arm. This is important.  Return to your normal activities as told by your health care provider. Ask your health care provider what activities are safe for you.  Contact a health care provider if you have a change in the thrill or have any signs of infection at your fistula site.  Keep all follow-up visits as told by your health care provider. This is  important. This information is not intended to replace advice given to you by your health care provider. Make sure you discuss any questions you have with your health care provider. Document Released: 06/18/2005 Document Revised: 12/23/2017 Document Reviewed: 12/23/2017 Elsevier Interactive Patient Education  2019 [employer].   [facility]  Discharge Instructions  AV Fistula or Graft Surgery for Dialysis Access  Please refer to the following instructions for your post-procedure care. Your surgeon or physician assistant will discuss any changes with you.  Activity  You may drive the day following your surgery, if you are comfortable and no longer taking prescription pain medication. Resume full activity as the soreness in your incision resolves.  Bathing/Showering  You may shower after you go home. Keep your incision dry for 48 hours. Do not soak in a bathtub, hot tub, or swim until the incision heals completely. You may not shower if you have a hemodialysis catheter.  Incision Care  Clean your incision with mild soap and water after 48 hours. Pat the area dry with a clean towel. You do not need a bandage unless otherwise instructed. Do not apply any ointments or creams to your incision. You may have skin glue on your incision. Do not peel it off. It will come off on its own in about one week. Your arm may swell a bit after surgery. To reduce swelling use pillows to elevate your arm so it is above your heart. Your doctor will tell you if you need to lightly wrap your arm with an ACE bandage.  Diet  Resume your normal diet. There are not special food restrictions following this procedure. In order to heal from your surgery, it is CRITICAL to get adequate nutrition. Your body requires vitamins, minerals, and protein. Vegetables are the best source of vitamins and minerals. Vegetables also provide the perfect balance of protein. Processed food has little  nutritional value, so try to avoid this.  Medications  Resume taking all of your medications. If your incision is causing pain, you may take over-the counter pain relievers such as acetaminophen (Tylenol). If you were prescribed a stronger pain medication, please be aware these medications can cause nausea and constipation. Prevent nausea by taking the medication with a snack or meal. Avoid constipation by drinking plenty of fluids and eating foods with high amount of fiber, such as fruits, vegetables, and grains. Do not take Tylenol if you are taking prescription pain medications.     Follow up Your surgeon may want to see you in the office following your access surgery. If so, this will be arranged at the time of your surgery.  Please call us immediately for any of the following conditions:  Increased pain, redness, drainage (pus) from your incision site Fever of 101 degrees or higher Severe or worsening pain at your incision site Hand pain or numbness.  Reduce  your risk of vascular disease:  Stop smoking. If you would like help, call [facility] at 1-800-QUIT-NOW 4458032415) or [place] [facility] [place] your cholesterol Maintain a desired weight Control your diabetes Keep your blood pressure down  Dialysis  It will take several weeks to several months for your new dialysis access to be ready for use. Your surgeon will determine when it is OK to use it. Your nephrologist will continue to direct your dialysis. You can continue to use your Permcath until your new access is ready for use.  If you have any questions, please call the office at [phone].

## 2018-07-28 NOTE — Progress Notes (Signed)
PROGRESS NOTE        PATIENT DETAILS Name: Mallory Chavez Age: 83 y.o. Sex: female Date of Birth: Jan 23, 1936 Admit Date: 07/19/2018 Admitting Physician Vianne Bulls, MD YTK:[id number], Annie Main, MD  Brief Narrative: Patient is a 83 y.o. female with history of chronic diastolic and systolic heart failure, CKD stage IV, with shortness of breath-was found to have acute hypoxic respiratory failure in the setting of decompensated heart failure with worsening renal function.  Point-of-care, diuretics-followed closely by cardiology/nephrology-now thought to have ESRD.  See below for further details.  Subjective: Right heel pain is much better following initiation of prednisone.  Denies any chest pain or shortness of breath.  t.  Assessment/Plan: Acute hypoxic respiratory failure: Secondary to decompensated combined systolic and diastolic heart failure in the setting of worsening renal function.  She initially was on BiPAP-but now on just 2 L of nasal cannula.  Continue to titrate off oxygen.   Acute on chronic combined systolic and diastolic heart failure: Volume status is improved after she was started on hemodialysis.  She was initially refractory to 2 diuretic therapy.  Acute kidney injury on CKD stage IV-now with ESRD: Nephrology following and directing HD care.  And now has been started on HD-nephrology following.  Was evaluated by [facility]-underwent tunneled dialysis catheter placement and left arm brachiocephalic AV fistula on 9/32.  Left heel pain likely secondary to acute gouty flare: Much improved after initiation of prednisone-last dose of prednisone on 1/28  Anemia: Hemoglobin of 4.3 on admission-no overt bleeding-suspicion for worsening CKD as cause of anemia.  Did require total of 3 units of PRBC this admission.  Hemoglobin currently stable with darbepoetin and IV iron dose per nephrology.  Prior MD (Dr Starla Link) discussed with GI MD (Dr. Edison Nasuti) on 1/19-given tenuous  respiratory/renal status-GI deferred any further endoscopic evaluation at this time.  Since there is no overt bleeding-hemoglobin is now stable over the past few days-suspect this can be pursued in the outpatient setting.    Hypertension: Controlled-continue amlodipine.    COPD: Stable-lungs are clear-continue bronchodilators.   GERD: Continue PPI.  Deconditioning/debility: Appreciate PT evaluation-plans are for home health on discharge.    DVT Prophylaxis: Prophylactic Heparin   Code Status:  DNR  Family Communication: None at bedside  Disposition Plan: Remain inpatient-home when outpatient HD chair is available.  Will require home health services on discharge.  Antimicrobial agents: Anti-infectives (From admission, onward)   Start     Dose/Rate Route Frequency Ordered Stop   07/23/18 0600  cefUROXime (ZINACEF) 1.5 g in sodium chloride 0.9 % 100 mL IVPB     1.5 g 200 mL/hr over 30 Minutes Intravenous On call to O.R. 07/22/18 3557 07/24/18 0559      Procedures: None  CONSULTS:  cardiology, GI and vascular surgery  Time spent: 25- minutes-Greater than 50% of this time was spent in counseling, explanation of diagnosis, planning of further management, and coordination of care.  MEDICATIONS: Scheduled Meds: . amLODipine  10 mg Oral QPC supper  . Chlorhexidine Gluconate Cloth  6 each Topical Q0600  . cinacalcet  30 mg Oral Q breakfast  . colchicine  0.6 mg Oral Q72H  . darbepoetin (ARANESP) injection - DIALYSIS  200 mcg Intravenous Q Mon-HD  . diclofenac sodium  2 g Topical QID  . feeding supplement  1 Container Oral TID BM  .  heparin injection (subcutaneous)  5,000 Units Subcutaneous Q8H  . mouth rinse  15 mL Mouth Rinse BID  . mometasone-formoterol  2 puff Inhalation BID  . multivitamin  1 tablet Oral QHS  . pantoprazole  40 mg Oral Daily  . predniSONE  50 mg Oral Q breakfast  . sevelamer carbonate  2,400 mg Oral TID WC  . sodium chloride flush  3 mL Intravenous  Q12H   Continuous Infusions: . sodium chloride    . sodium chloride     PRN Meds:.sodium chloride, acetaminophen, heparin, HYDROcodone-acetaminophen, methocarbamol, ondansetron (ZOFRAN) IV, sodium chloride flush   PHYSICAL EXAM: Vital signs: Vitals:   07/28/18 1030 07/28/18 1100 07/28/18 1130 07/28/18 1132  BP: 124/64 (!) 130/43 (!) 153/74 (!) 151/46  Pulse: 66 68 68 92  Resp:    15  Temp:    98.2 F (36.8 C)  TempSrc:    Oral  SpO2:    98%  Weight:    74.5 kg  Height:       Filed Weights   07/26/18 0500 07/28/18 0700 07/28/18 1132  Weight: 75.3 kg 78.1 kg 74.5 kg   Body mass index is 29.09 kg/m.   General appearance:Awake, alert, not in any distress.  Eyes:no scleral icterus. HEENT: Atraumatic and Normocephalic Neck: supple, no JVD. Resp:Good air entry bilaterally,no rales or rhonchi CVS: S1 S2 regular GI: Bowel sounds present, Non tender and not distended with no gaurding, rigidity or rebound. Extremities: B/L Lower Ext shows no edema, both legs are warm to touch-left heel is hardly tender today compared to yesterday. Neurology:  Non focal Psychiatric: Normal judgment and insight. Normal mood. Musculoskeletal:No digital cyanosis Skin:No Rash, warm and dry Wounds:N/A  I have personally reviewed following labs and imaging studies  LABORATORY DATA: CBC: Recent Labs  Lab 07/22/18 0445 07/23/18 0416 07/24/18 1312 07/25/18 0519 07/27/18 0347 07/28/18 0700  WBC 11.5* 12.3* 11.4* 11.0* 13.3* 14.4*  NEUTROABS 8.4*  --   --   --   --   --   HGB 9.1* 9.6* 8.7* 9.4* 9.0* 8.7*  HCT 28.5* 29.3* 27.4* 29.4* 27.6* 26.6*  MCV 89.9 88.5 89.8 90.2 89.9 90.2  PLT 217 253 224 233 211 267    Basic Metabolic Panel: Recent Labs  Lab 07/22/18 0445 07/23/18 0416 07/24/18 0508 07/25/18 0519 07/26/18 0328 07/27/18 0347 07/28/18 0422  NA 138 137 136 137 135 133* 130*  K 4.0 3.7 4.3 3.9 3.7 3.8 4.1  CL 108 104 102 102 97* 95* 93*  CO2 19* 19* 22 24 25 25 22   GLUCOSE  107* 104* 115* 94 80 107* 142*  BUN 76* 85* 53* 37* 29* 47* 63*  CREATININE 5.61* 5.70* 4.11* 3.86* 3.39* 4.73* 5.15*  CALCIUM 9.9 10.3 10.2 10.3 10.1 10.5* 11.1*  MG 1.9 1.8  --   --   --   --   --   PHOS 6.3* 6.3* 5.4* 5.2* 4.6 5.4* 4.6    GFR: Estimated Creatinine Clearance: 8.4 mL/min (A) (by C-G formula based on SCr of 5.15 mg/dL (H)).  Liver Function Tests: Recent Labs  Lab 07/24/18 0508 07/25/18 0519 07/26/18 0328 07/27/18 0347 07/28/18 0422  ALBUMIN 3.4* 3.0* 2.9* 3.0* 3.1*   No results for input(s): LIPASE, AMYLASE in the last 168 hours. No results for input(s): AMMONIA in the last 168 hours.  Coagulation Profile: Recent Labs  Lab 07/23/18 0416  INR 1.07    Cardiac Enzymes: No results for input(s): CKTOTAL, CKMB, CKMBINDEX, TROPONINI in the last 168  hours.  BNP (last 3 results) No results for input(s): PROBNP in the last 8760 hours.  HbA1C: No results for input(s): HGBA1C in the last 72 hours.  CBG: Recent Labs  Lab 07/23/18 1212  GLUCAP 107*    Lipid Profile: No results for input(s): CHOL, HDL, LDLCALC, TRIG, CHOLHDL, LDLDIRECT in the last 72 hours.  Thyroid Function Tests: No results for input(s): TSH, T4TOTAL, FREET4, T3FREE, THYROIDAB in the last 72 hours.  Anemia Panel: No results for input(s): VITAMINB12, FOLATE, FERRITIN, TIBC, IRON, RETICCTPCT in the last 72 hours.  Urine analysis:    Component Value Date/Time   COLORURINE YELLOW 07/02/2016 1058   APPEARANCEUR CLEAR 07/02/2016 1058   LABSPEC 1.010 07/02/2016 1058   PHURINE 6.0 07/02/2016 1058   GLUCOSEU NEGATIVE 07/02/2016 1058   HGBUR NEGATIVE 07/02/2016 1058   BILIRUBINUR NEGATIVE 07/02/2016 1058   [place] 07/02/2016 1058   PROTEINUR 30 (A) 07/02/2016 1058   NITRITE NEGATIVE 07/02/2016 1058   LEUKOCYTESUR SMALL (A) 07/02/2016 1058    Sepsis Labs: Lactic Acid, Venous    Component Value Date/Time   LATICACIDVEN 1.14 07/19/2018 1806    MICROBIOLOGY: Recent  Results (from the past 240 hour(s))  MRSA PCR Screening     Status: None   Collection Time: 07/19/18 10:08 PM  Result Value Ref Range Status   MRSA by PCR NEGATIVE NEGATIVE Final    Comment:        The GeneXpert MRSA Assay (FDA approved for NASAL specimens only), is one component of a comprehensive MRSA colonization surveillance program. It is not intended to diagnose MRSA infection nor to guide or monitor treatment for MRSA infections. Performed at [facility], [place] [address] [place], [place] [address]     RADIOLOGY STUDIES/RESULTS: Dg Chest Port 1 View  Result Date: 07/23/2018 CLINICAL DATA:  Status post dialysis catheter insertion. EXAM: PORTABLE CHEST 1 VIEW COMPARISON:  07/19/2018 FINDINGS: Right chest wall dialysis catheter is noted with tip at the cavoatrial junction. No pneumothorax identified. Mild cardiac enlargement and aortic atherosclerosis. Scarring is identified within both lower lobes. No pleural effusion or edema identified. IMPRESSION: 1. No complications identified status post right chest wall dialysis catheter placement. 2. Cardiac enlargement 3. Bilateral lower lobe scarring. Electronically Signed   By: Kerby Moors M.D.   On: 07/23/2018 12:49   Dg Chest Port 1 View  Result Date: 07/19/2018 CLINICAL DATA:  Shortness of breath for 3 days EXAM: PORTABLE CHEST 1 VIEW COMPARISON:  02/06/2018 FINDINGS: Cardiac shadow is enlarged but stable. Lungs are well aerated bilaterally. No focal infiltrate or sizable effusion is seen. No acute bony abnormality is noted. IMPRESSION: Stable cardiomegaly.  No acute abnormality noted. Electronically Signed   By: Inez Catalina M.D.   On: 07/19/2018 18:26   Dg Foot Complete Left  Result Date: 07/26/2018 CLINICAL DATA:  Pain in heel for about one week, h/o gout. No injury. EXAM: LEFT FOOT - COMPLETE 3+ VIEW COMPARISON:  None. FINDINGS: Chronic erosive changes about the first MTP joint compatible with the given history of gout.  Remainder of the joint spaces throughout the LEFT foot appear well maintained. No acute appearing osseous abnormality. Adjacent soft tissues are unremarkable. IMPRESSION: 1. No acute findings. 2. Chronic erosive changes at the first MTP joint, compatible with sequela of gout. Electronically Signed   By: Franki Cabot M.D.   On: 07/26/2018 11:41   Dg Fluoro Guide Cv Line-no Report  Result Date: 07/23/2018 Fluoroscopy was utilized by the requesting physician.  No radiographic interpretation.   Vas [place] Upper Ext Vein Mapping (pre-op Avf)  Result Date: 07/21/2018 UPPER EXTREMITY VEIN MAPPING  Indications: Pre-access. Performing Technologist: Maudry Mayhew MHA, RDMS, RVT, RDCS  Examination Guidelines: A complete evaluation includes B-mode imaging, spectral Doppler, color Doppler, and power Doppler as needed of all accessible portions of each vessel. Bilateral testing is considered an integral part of a complete examination. Limited examinations for reoccurring indications may be performed as noted. +-----------------+-------------+----------+--------------+ Right Cephalic   Diameter (cm)Depth (cm)   Findings    +-----------------+-------------+----------+--------------+ Shoulder             0.28        1.00                  +-----------------+-------------+----------+--------------+ Prox upper arm       0.12        0.56                  +-----------------+-------------+----------+--------------+ Mid upper arm        0.15        0.57                  +-----------------+-------------+----------+--------------+ Dist upper arm       0.09        0.49                  +-----------------+-------------+----------+--------------+ Antecubital fossa                       not visualized +-----------------+-------------+----------+--------------+ Prox forearm                            not visualized +-----------------+-------------+----------+--------------+ Mid forearm                              not visualized +-----------------+-------------+----------+--------------+ Dist forearm                            not visualized +-----------------+-------------+----------+--------------+ Wrist                                   not visualized +-----------------+-------------+----------+--------------+ +-----------------+-------------+----------+--------------+ Right Basilic    Diameter (cm)Depth (cm)   Findings    +-----------------+-------------+----------+--------------+ Shoulder                                not visualized +-----------------+-------------+----------+--------------+ Prox upper arm                          not visualized +-----------------+-------------+----------+--------------+ Mid upper arm        0.40                              +-----------------+-------------+----------+--------------+ Dist upper arm       0.34                 branching    +-----------------+-------------+----------+--------------+ Antecubital fossa    0.23                              +-----------------+-------------+----------+--------------+ Prox forearm  0.17                              +-----------------+-------------+----------+--------------+ Mid forearm                             not visualized +-----------------+-------------+----------+--------------+ Distal forearm                          not visualized +-----------------+-------------+----------+--------------+ Wrist                                   not visualized +-----------------+-------------+----------+--------------+ +-----------------+-------------+----------+---------------------------------+ Left Cephalic    Diameter (cm)Depth (cm)            Findings              +-----------------+-------------+----------+---------------------------------+ Shoulder             0.29        0.71                                      +-----------------+-------------+----------+---------------------------------+ Prox upper arm       0.31        0.42                                     +-----------------+-------------+----------+---------------------------------+ Mid upper arm        0.32        0.43                                     +-----------------+-------------+----------+---------------------------------+ Dist upper arm       0.28        0.48                                     +-----------------+-------------+----------+---------------------------------+ Antecubital fossa    0.27        0.38                                     +-----------------+-------------+----------+---------------------------------+ Prox forearm         0.17        0.62                                     +-----------------+-------------+----------+---------------------------------+ Mid forearm                             not visualized and IV at location +-----------------+-------------+----------+---------------------------------+ Dist forearm         0.12        0.20                                     +-----------------+-------------+----------+---------------------------------+ Wrist  not visualized           +-----------------+-------------+----------+---------------------------------+ +-----------------+-------------+----------+--------------+ Left Basilic     Diameter (cm)Depth (cm)   Findings    +-----------------+-------------+----------+--------------+ Prox upper arm       0.34                              +-----------------+-------------+----------+--------------+ Mid upper arm        0.26                              +-----------------+-------------+----------+--------------+ Dist upper arm       0.31                 branching    +-----------------+-------------+----------+--------------+ Antecubital fossa    0.20                               +-----------------+-------------+----------+--------------+ Prox forearm         0.21                              +-----------------+-------------+----------+--------------+ Mid forearm          0.21                              +-----------------+-------------+----------+--------------+ Distal forearm       0.17                              +-----------------+-------------+----------+--------------+ Wrist                                   not visualized +-----------------+-------------+----------+--------------+ *See table(s) above for measurements and observations.  Diagnosing physician: Ruta Hinds MD Electronically signed by Ruta Hinds MD on 07/21/2018 at 5:25:53 PM.    Final      LOS: 9 days   Oren Binet, MD  [facility]  If 7PM-7AM, please contact night-coverage  Please page via [email or web address]-Password [email or web address]-click on MD name and type text message  07/28/2018, 1:39 PM

## 2018-07-28 NOTE — Discharge Summary (Signed)
PATIENT DETAILS Name: Mallory Chavez Age: 83 y.o. Sex: female Date of Birth: [birth year] MRN: [id number]. Admitting Physician: Vianne Bulls, MD SJG:GEZMO, Annie Main, MD  Admit Date: 07/19/2018 Discharge date: 07/28/2018  Recommendations for Outpatient Follow-up:  1. Follow up with PCP in 1-2 weeks 2. Please obtain BMP/CBC in one week 3. Please ensure outpatient GI consultation for consideration of endoscopic evaluation  Admitted From:  Home  Disposition: Home with home health services   Home Health: Yes  Equipment/Devices: None  Discharge Condition: Stable  CODE STATUS: DNR  Diet recommendation:  Heart Healthy   Brief Summary: See H&P, Labs, Consult and Test reports for all details in brief,Patient is a 83 y.o. female with history of chronic diastolic and systolic heart failure, CKD stage IV, with shortness of breath-was found to have acute hypoxic respiratory failure in the setting of decompensated heart failure with worsening renal function.  Point-of-care, diuretics-followed closely by cardiology/nephrology-now thought to have ESRD.  See below for further details.  Brief Hospital Course: Acute on chronic hypoxic respiratory failure: Secondary to decompensated combined systolic and diastolic heart failure in the setting of worsening renal function.  She initially was on BiPAP-but now on just 2 L of nasal cannula.  Patient is on home O2-mostly with ambulation and at night.  Acute on chronic combined systolic and diastolic heart failure: Volume status is improved after she was started on hemodialysis.  She was initially refractory to 2 diuretic therapy.  Acute kidney injury on CKD stage IV-now with ESRD: Nephrology following and directing HD care.  And now has been started on HD-nephrology followed closely during this hospital stay.  Has been accepted at outpatient HD-first HD this coming Thursday.  Spoke with Dr. Posey Pronto over the phone-okay to discharge today-patient  instructed to follow at HD center for outpatient HD this coming Thursday.  Note-patient was evaluated by [facility]-underwent tunneled dialysis catheter placement and left arm brachiocephalic AV fistula on 2/94.  Left heel pain likely secondary to acute gouty flare: Much improved after initiation of prednisone-last dose of prednisone on 1/28  Anemia: had a hemoglobin of 4.3 on admission-no overt bleeding-suspicion for worsening CKD as cause of anemia.  Did require total of 3 units of PRBC this admission.  Hemoglobin currently stable with darbepoetin and IV iron dose per nephrology.  Prior MD (Dr Starla Link) discussed with GI MD (Dr. Edison Nasuti) on 1/19-given tenuous respiratory/renal status-GI deferred any further endoscopic evaluation at this time.  Since there is no overt bleeding-hemoglobin is now stable over the past few days-suspect this can be pursued in the outpatient setting.    Hypertension: Controlled-continue amlodipine.    COPD: Stable-lungs are clear-continue bronchodilators.   GERD: Continue PPI.  Deconditioning/debility: Appreciate PT evaluation-plans are for home health on discharge.    Procedures/Studies: None  Discharge Diagnoses:  Principal Problem:   Symptomatic anemia Active Problems:   Essential hypertension   COPD GOLD II   Acute renal failure superimposed on stage 4 chronic kidney disease (HCC)   Acute on chronic combined systolic and diastolic CHF (congestive heart failure) ([place])   Palliative care encounter   DNR (do not resuscitate)   Discharge Instructions:  Activity:  As tolerated  Discharge Instructions    Diet - low sodium heart healthy   Complete by:  As directed    Discharge instructions   Complete by:  As directed    Follow with Primary MD  [name], MD in 1 week  Follow at HD center this Thursday to start  dialysis  Follow with Gastroenterology for consideration of outpatient Colonoscopy  Please get a complete blood count and chemistry panel  checked by your Primary MD at your next visit, and again as instructed by your Primary MD.  Get Medicines reviewed and adjusted: Please take all your medications with you for your next visit with your Primary MD  Laboratory/radiological data: Please request your Primary MD to go over all hospital tests and procedure/radiological results at the follow up, please ask your Primary MD to get all Hospital records sent to his/her office.  In some cases, they will be blood work, cultures and biopsy results pending at the time of your discharge. Please request that your primary care M.D. follows up on these results.  Also Note the following: If you experience worsening of your admission symptoms, develop shortness of breath, life threatening emergency, suicidal or homicidal thoughts you must seek medical attention immediately by calling 911 or calling your MD immediately  if symptoms less severe.  You must read complete instructions/literature along with all the possible adverse reactions/side effects for all the Medicines you take and that have been prescribed to you. Take any new Medicines after you have completely understood and accpet all the possible adverse reactions/side effects.   Do not drive when taking Pain medications or sleeping medications (Benzodaizepines)  Do not take more than prescribed Pain, Sleep and Anxiety Medications. It is not advisable to combine anxiety,sleep and pain medications without talking with your primary care practitioner  Special Instructions: If you have smoked or chewed Tobacco  in the last 2 yrs please stop smoking, stop any regular Alcohol  and or any Recreational drug use.  Wear Seat belts while driving.  Please note: You were cared for by a hospitalist during your hospital stay. Once you are discharged, your primary care physician will handle any further medical issues. Please note that NO REFILLS for any discharge medications will be authorized once you are  discharged, as it is imperative that you return to your primary care physician (or establish a relationship with a primary care physician if you do not have one) for your post hospital discharge needs so that they can reassess your need for medications and monitor your lab values.   Increase activity slowly   Complete by:  As directed      Allergies as of 07/28/2018   No Active Allergies     Medication List    STOP taking these medications   diltiazem 360 MG 24 hr capsule Commonly known as:  CARDIZEM CD   furosemide 40 MG tablet Commonly known as:  LASIX   furosemide 80 MG tablet Commonly known as:  LASIX     TAKE these medications   amLODipine 10 MG tablet Commonly known as:  NORVASC Take 10 mg by mouth daily after supper.   aspirin EC 81 MG tablet Take 81 mg by mouth daily after supper.   budesonide-formoterol 160-4.5 MCG/ACT inhaler Commonly known as:  SYMBICORT Inhale 2 puffs into the lungs 2 (two) times daily.   cinacalcet 30 MG tablet Commonly known as:  SENSIPAR Take 30 mg by mouth daily.   colchicine 0.6 MG tablet Take 1 tablet (0.6 mg total) by mouth every 3 (three) days. What changed:    when to take this  reasons to take this   esomeprazole 20 MG capsule Commonly known as:  NEXIUM Take 20 mg by mouth daily at 12 noon.   loratadine 10 MG tablet Commonly known as:  CLARITIN  Take 10 mg by mouth daily after supper.   OXYGEN Inhale 2 L into the lungs See admin instructions. 2lpm with sleep and exertion  [facility]   predniSONE 20 MG tablet Commonly known as:  DELTASONE Take 1 tablet (20 mg total) by mouth daily with breakfast. Start taking on:  July 29, 2018   PROAIR HFA 108 (90 Base) MCG/ACT inhaler Generic drug:  albuterol INHALE 2 PUFFS EVERY 4 HOURS IF NEEDED WHEEZING SHORTNESS OF BREATH What changed:  See the new instructions.   REFRESH OP Place 1 drop into both eyes daily as needed (dry eyes).   sevelamer carbonate 800 MG tablet Commonly  known as:  RENVELA Take 3 tablets (2,400 mg total) by mouth 3 (three) times daily with meals.      Follow-up Information    Waynetta Sandy, MD Follow up in 5 week(s).   Specialties:  Vascular Surgery, Cardiology Contact information: [place] [address] [phone]        [name], MD. Schedule an appointment as soon as possible for a visit in 1 week(s).   Specialty:  Endocrinology Contact information: [place] [address] [phone]        Otis Brace, MD. Schedule an appointment as soon as possible for a visit in 1 week(s).   Specialty:  Gastroenterology Why:  for outpatient endoscopic/colonoscopic eval Contact information: [place] [address] [phone]        Hemodialysis center Follow up.   Why:  [facility] [address] . 1st treatment Thursday ,January 30,2020 at 11:00 am         No Active Allergies  Consultations:   nephrology and vascular surgery   Other Procedures/Studies: Dg Chest Port 1 View  Result Date: 07/23/2018 CLINICAL DATA:  Status post dialysis catheter insertion. EXAM: PORTABLE CHEST 1 VIEW COMPARISON:  07/19/2018 FINDINGS: Right chest wall dialysis catheter is noted with tip at the cavoatrial junction. No pneumothorax identified. Mild cardiac enlargement and aortic atherosclerosis. Scarring is identified within both lower lobes. No pleural effusion or edema identified. IMPRESSION: 1. No complications identified status post right chest wall dialysis catheter placement. 2. Cardiac enlargement 3. Bilateral lower lobe scarring. Electronically Signed   By: Kerby Moors M.D.   On: 07/23/2018 12:49   Dg Chest Port 1 View  Result Date: 07/19/2018 CLINICAL DATA:  Shortness of breath for 3 days EXAM: PORTABLE CHEST 1 VIEW COMPARISON:  02/06/2018 FINDINGS: Cardiac shadow is enlarged but stable. Lungs are well aerated bilaterally. No focal infiltrate  or sizable effusion is seen. No acute bony abnormality is noted. IMPRESSION: Stable cardiomegaly.  No acute abnormality noted. Electronically Signed   By: Inez Catalina M.D.   On: 07/19/2018 18:26   Dg Foot Complete Left  Result Date: 07/26/2018 CLINICAL DATA:  Pain in heel for about one week, h/o gout. No injury. EXAM: LEFT FOOT - COMPLETE 3+ VIEW COMPARISON:  None. FINDINGS: Chronic erosive changes about the first MTP joint compatible with the given history of gout. Remainder of the joint spaces throughout the LEFT foot appear well maintained. No acute appearing osseous abnormality. Adjacent soft tissues are unremarkable. IMPRESSION: 1. No acute findings. 2. Chronic erosive changes at the first MTP joint, compatible with sequela of gout. Electronically Signed   By: Franki Cabot M.D.   On: 07/26/2018 11:41   Dg Fluoro Guide Cv Line-no Report  Result Date: 07/23/2018 Fluoroscopy was utilized by the requesting physician.  No  radiographic interpretation.   Vas [place] Upper Ext Vein Mapping (pre-op Avf)  Result Date: 07/21/2018 UPPER EXTREMITY VEIN MAPPING  Indications: Pre-access. Performing Technologist: Maudry Mayhew MHA, RDMS, RVT, RDCS  Examination Guidelines: A complete evaluation includes B-mode imaging, spectral Doppler, color Doppler, and power Doppler as needed of all accessible portions of each vessel. Bilateral testing is considered an integral part of a complete examination. Limited examinations for reoccurring indications may be performed as noted. +-----------------+-------------+----------+--------------+ Right Cephalic   Diameter (cm)Depth (cm)   Findings    +-----------------+-------------+----------+--------------+ Shoulder             0.28        1.00                  +-----------------+-------------+----------+--------------+ Prox upper arm       0.12        0.56                  +-----------------+-------------+----------+--------------+ Mid upper arm        0.15         0.57                  +-----------------+-------------+----------+--------------+ Dist upper arm       0.09        0.49                  +-----------------+-------------+----------+--------------+ Antecubital fossa                       not visualized +-----------------+-------------+----------+--------------+ Prox forearm                            not visualized +-----------------+-------------+----------+--------------+ Mid forearm                             not visualized +-----------------+-------------+----------+--------------+ Dist forearm                            not visualized +-----------------+-------------+----------+--------------+ Wrist                                   not visualized +-----------------+-------------+----------+--------------+ +-----------------+-------------+----------+--------------+ Right Basilic    Diameter (cm)Depth (cm)   Findings    +-----------------+-------------+----------+--------------+ Shoulder                                not visualized +-----------------+-------------+----------+--------------+ Prox upper arm                          not visualized +-----------------+-------------+----------+--------------+ Mid upper arm        0.40                              +-----------------+-------------+----------+--------------+ Dist upper arm       0.34                 branching    +-----------------+-------------+----------+--------------+ Antecubital fossa    0.23                              +-----------------+-------------+----------+--------------+ Prox forearm  0.17                              +-----------------+-------------+----------+--------------+ Mid forearm                             not visualized +-----------------+-------------+----------+--------------+ Distal forearm                          not visualized +-----------------+-------------+----------+--------------+  Wrist                                   not visualized +-----------------+-------------+----------+--------------+ +-----------------+-------------+----------+---------------------------------+ Left Cephalic    Diameter (cm)Depth (cm)            Findings              +-----------------+-------------+----------+---------------------------------+ Shoulder             0.29        0.71                                     +-----------------+-------------+----------+---------------------------------+ Prox upper arm       0.31        0.42                                     +-----------------+-------------+----------+---------------------------------+ Mid upper arm        0.32        0.43                                     +-----------------+-------------+----------+---------------------------------+ Dist upper arm       0.28        0.48                                     +-----------------+-------------+----------+---------------------------------+ Antecubital fossa    0.27        0.38                                     +-----------------+-------------+----------+---------------------------------+ Prox forearm         0.17        0.62                                     +-----------------+-------------+----------+---------------------------------+ Mid forearm                             not visualized and IV at location +-----------------+-------------+----------+---------------------------------+ Dist forearm         0.12        0.20                                     +-----------------+-------------+----------+---------------------------------+ Wrist  not visualized           +-----------------+-------------+----------+---------------------------------+ +-----------------+-------------+----------+--------------+ Left Basilic     Diameter (cm)Depth (cm)   Findings     +-----------------+-------------+----------+--------------+ Prox upper arm       0.34                              +-----------------+-------------+----------+--------------+ Mid upper arm        0.26                              +-----------------+-------------+----------+--------------+ Dist upper arm       0.31                 branching    +-----------------+-------------+----------+--------------+ Antecubital fossa    0.20                              +-----------------+-------------+----------+--------------+ Prox forearm         0.21                              +-----------------+-------------+----------+--------------+ Mid forearm          0.21                              +-----------------+-------------+----------+--------------+ Distal forearm       0.17                              +-----------------+-------------+----------+--------------+ Wrist                                   not visualized +-----------------+-------------+----------+--------------+ *See table(s) above for measurements and observations.  Diagnosing physician: Ruta Hinds MD Electronically signed by Ruta Hinds MD on 07/21/2018 at 5:25:53 PM.    Final       TODAY-DAY OF DISCHARGE:  Subjective:   Mallory Chavez today has no headache,no chest abdominal pain,no new weakness tingling or numbness, feels much better wants to go home today.   Objective:   Blood pressure (!) 151/46, pulse 92, temperature 98.2 F (36.8 C), temperature source Oral, resp. rate 15, height 5\' 3"  (1.6 m), weight 74.5 kg, SpO2 98 %.  Intake/Output Summary (Last 24 hours) at 07/28/2018 1431 Last data filed at 07/28/2018 1132 Gross per 24 hour  Intake -  Output 2500 ml  Net -2500 ml   Filed Weights   07/26/18 0500 07/28/18 0700 07/28/18 1132  Weight: 75.3 kg 78.1 kg 74.5 kg    Exam: Awake Alert, Oriented *3, No new F.N deficits, Normal affect [place].AT,PERRAL Supple Neck,No JVD, No cervical  lymphadenopathy appriciated.  Symmetrical Chest wall movement, Good air movement bilaterally, CTAB RRR,No Gallops,Rubs or new Murmurs, No Parasternal Heave +ve B.Sounds, Abd Soft, Non tender, No organomegaly appriciated, No rebound -guarding or rigidity. No Cyanosis, Clubbing or edema, No new Rash or bruise   PERTINENT RADIOLOGIC STUDIES: Dg Chest Port 1 View  Result Date: 07/23/2018 CLINICAL DATA:  Status post dialysis catheter insertion. EXAM: PORTABLE CHEST 1 VIEW COMPARISON:  07/19/2018 FINDINGS: Right chest wall dialysis catheter is noted with tip at the cavoatrial junction. No pneumothorax identified. Mild cardiac enlargement  and aortic atherosclerosis. Scarring is identified within both lower lobes. No pleural effusion or edema identified. IMPRESSION: 1. No complications identified status post right chest wall dialysis catheter placement. 2. Cardiac enlargement 3. Bilateral lower lobe scarring. Electronically Signed   By: Kerby Moors M.D.   On: 07/23/2018 12:49   Dg Chest Port 1 View  Result Date: 07/19/2018 CLINICAL DATA:  Shortness of breath for 3 days EXAM: PORTABLE CHEST 1 VIEW COMPARISON:  02/06/2018 FINDINGS: Cardiac shadow is enlarged but stable. Lungs are well aerated bilaterally. No focal infiltrate or sizable effusion is seen. No acute bony abnormality is noted. IMPRESSION: Stable cardiomegaly.  No acute abnormality noted. Electronically Signed   By: Inez Catalina M.D.   On: 07/19/2018 18:26   Dg Foot Complete Left  Result Date: 07/26/2018 CLINICAL DATA:  Pain in heel for about one week, h/o gout. No injury. EXAM: LEFT FOOT - COMPLETE 3+ VIEW COMPARISON:  None. FINDINGS: Chronic erosive changes about the first MTP joint compatible with the given history of gout. Remainder of the joint spaces throughout the LEFT foot appear well maintained. No acute appearing osseous abnormality. Adjacent soft tissues are unremarkable. IMPRESSION: 1. No acute findings. 2. Chronic erosive changes  at the first MTP joint, compatible with sequela of gout. Electronically Signed   By: Franki Cabot M.D.   On: 07/26/2018 11:41   Dg Fluoro Guide Cv Line-no Report  Result Date: 07/23/2018 Fluoroscopy was utilized by the requesting physician.  No radiographic interpretation.   Vas [place] Upper Ext Vein Mapping (pre-op Avf)  Result Date: 07/21/2018 UPPER EXTREMITY VEIN MAPPING  Indications: Pre-access. Performing Technologist: Maudry Mayhew MHA, RDMS, RVT, RDCS  Examination Guidelines: A complete evaluation includes B-mode imaging, spectral Doppler, color Doppler, and power Doppler as needed of all accessible portions of each vessel. Bilateral testing is considered an integral part of a complete examination. Limited examinations for reoccurring indications may be performed as noted. +-----------------+-------------+----------+--------------+ Right Cephalic   Diameter (cm)Depth (cm)   Findings    +-----------------+-------------+----------+--------------+ Shoulder             0.28        1.00                  +-----------------+-------------+----------+--------------+ Prox upper arm       0.12        0.56                  +-----------------+-------------+----------+--------------+ Mid upper arm        0.15        0.57                  +-----------------+-------------+----------+--------------+ Dist upper arm       0.09        0.49                  +-----------------+-------------+----------+--------------+ Antecubital fossa                       not visualized +-----------------+-------------+----------+--------------+ Prox forearm                            not visualized +-----------------+-------------+----------+--------------+ Mid forearm                             not visualized +-----------------+-------------+----------+--------------+ Dist forearm  not visualized +-----------------+-------------+----------+--------------+ Wrist                                    not visualized +-----------------+-------------+----------+--------------+ +-----------------+-------------+----------+--------------+ Right Basilic    Diameter (cm)Depth (cm)   Findings    +-----------------+-------------+----------+--------------+ Shoulder                                not visualized +-----------------+-------------+----------+--------------+ Prox upper arm                          not visualized +-----------------+-------------+----------+--------------+ Mid upper arm        0.40                              +-----------------+-------------+----------+--------------+ Dist upper arm       0.34                 branching    +-----------------+-------------+----------+--------------+ Antecubital fossa    0.23                              +-----------------+-------------+----------+--------------+ Prox forearm         0.17                              +-----------------+-------------+----------+--------------+ Mid forearm                             not visualized +-----------------+-------------+----------+--------------+ Distal forearm                          not visualized +-----------------+-------------+----------+--------------+ Wrist                                   not visualized +-----------------+-------------+----------+--------------+ +-----------------+-------------+----------+---------------------------------+ Left Cephalic    Diameter (cm)Depth (cm)            Findings              +-----------------+-------------+----------+---------------------------------+ Shoulder             0.29        0.71                                     +-----------------+-------------+----------+---------------------------------+ Prox upper arm       0.31        0.42                                     +-----------------+-------------+----------+---------------------------------+ Mid upper arm         0.32        0.43                                     +-----------------+-------------+----------+---------------------------------+ Dist upper arm       0.28        0.48                                     +-----------------+-------------+----------+---------------------------------+  Antecubital fossa    0.27        0.38                                     +-----------------+-------------+----------+---------------------------------+ Prox forearm         0.17        0.62                                     +-----------------+-------------+----------+---------------------------------+ Mid forearm                             not visualized and IV at location +-----------------+-------------+----------+---------------------------------+ Dist forearm         0.12        0.20                                     +-----------------+-------------+----------+---------------------------------+ Wrist                                            not visualized           +-----------------+-------------+----------+---------------------------------+ +-----------------+-------------+----------+--------------+ Left Basilic     Diameter (cm)Depth (cm)   Findings    +-----------------+-------------+----------+--------------+ Prox upper arm       0.34                              +-----------------+-------------+----------+--------------+ Mid upper arm        0.26                              +-----------------+-------------+----------+--------------+ Dist upper arm       0.31                 branching    +-----------------+-------------+----------+--------------+ Antecubital fossa    0.20                              +-----------------+-------------+----------+--------------+ Prox forearm         0.21                              +-----------------+-------------+----------+--------------+ Mid forearm          0.21                               +-----------------+-------------+----------+--------------+ Distal forearm       0.17                              +-----------------+-------------+----------+--------------+ Wrist                                   not visualized +-----------------+-------------+----------+--------------+ *See table(s) above for measurements and observations.  Diagnosing physician: Ruta Hinds MD Electronically signed by Juanda Crumble  Fields MD on 07/21/2018 at 5:25:53 PM.    Final      PERTINENT LAB RESULTS: CBC: Recent Labs    07/27/18 0347 07/28/18 0700  WBC 13.3* 14.4*  HGB 9.0* 8.7*  HCT 27.6* 26.6*  PLT 211 224   CMET CMP     Component Value Date/Time   NA 130 (L) 07/28/2018 0422   K 4.1 07/28/2018 0422   CL 93 (L) 07/28/2018 0422   CO2 22 07/28/2018 0422   GLUCOSE 142 (H) 07/28/2018 0422   BUN 63 (H) 07/28/2018 0422   CREATININE 5.15 (H) 07/28/2018 0422   CALCIUM 11.1 (H) 07/28/2018 0422   PROT 5.8 (L) 07/21/2018 0659   ALBUMIN 3.1 (L) 07/28/2018 0422   AST 14 (L) 07/21/2018 0659   ALT 11 07/21/2018 0659   ALKPHOS 62 07/21/2018 0659   BILITOT 0.6 07/21/2018 0659   GFRNONAA 7 (L) 07/28/2018 0422   GFRAA 8 (L) 07/28/2018 0422    GFR Estimated Creatinine Clearance: 8.4 mL/min (A) (by C-G formula based on SCr of 5.15 mg/dL (H)). No results for input(s): LIPASE, AMYLASE in the last 72 hours. No results for input(s): CKTOTAL, CKMB, CKMBINDEX, TROPONINI in the last 72 hours. Invalid input(s): POCBNP No results for input(s): DDIMER in the last 72 hours. No results for input(s): HGBA1C in the last 72 hours. No results for input(s): CHOL, HDL, LDLCALC, TRIG, CHOLHDL, LDLDIRECT in the last 72 hours. No results for input(s): TSH, T4TOTAL, T3FREE, THYROIDAB in the last 72 hours.  Invalid input(s): FREET3 No results for input(s): VITAMINB12, FOLATE, FERRITIN, TIBC, IRON, RETICCTPCT in the last 72 hours. Coags: No results for input(s): INR in the last 72 hours.  Invalid input(s):  PT Microbiology: Recent Results (from the past 240 hour(s))  MRSA PCR Screening     Status: None   Collection Time: 07/19/18 10:08 PM  Result Value Ref Range Status   MRSA by PCR NEGATIVE NEGATIVE Final    Comment:        The GeneXpert MRSA Assay (FDA approved for NASAL specimens only), is one component of a comprehensive MRSA colonization surveillance program. It is not intended to diagnose MRSA infection nor to guide or monitor treatment for MRSA infections. Performed at [facility], [place] [address] [place], [place] [address]     FURTHER DISCHARGE INSTRUCTIONS:  Get Medicines reviewed and adjusted: Please take all your medications with you for your next visit with your Primary MD  Laboratory/radiological data: Please request your Primary MD to go over all hospital tests and procedure/radiological results at the follow up, please ask your Primary MD to get all Hospital records sent to his/her office.  In some cases, they will be blood work, cultures and biopsy results pending at the time of your discharge. Please request that your primary care M.D. goes through all the records of your hospital data and follows up on these results.  Also Note the following: If you experience worsening of your admission symptoms, develop shortness of breath, life threatening emergency, suicidal or homicidal thoughts you must seek medical attention immediately by calling 911 or calling your MD immediately  if symptoms less severe.  You must read complete instructions/literature along with all the possible adverse reactions/side effects for all the Medicines you take and that have been prescribed to you. Take any new Medicines after you have completely understood and accpet all the possible adverse reactions/side effects.   Do not drive when taking Pain medications or sleeping medications (Benzodaizepines)  Do  not take more than prescribed Pain, Sleep and Anxiety Medications. It is not  advisable to combine anxiety,sleep and pain medications without talking with your primary care practitioner  Special Instructions: If you have smoked or chewed Tobacco  in the last 2 yrs please stop smoking, stop any regular Alcohol  and or any Recreational drug use.  Wear Seat belts while driving.  Please note: You were cared for by a hospitalist during your hospital stay. Once you are discharged, your primary care physician will handle any further medical issues. Please note that NO REFILLS for any discharge medications will be authorized once you are discharged, as it is imperative that you return to your primary care physician (or establish a relationship with a primary care physician if you do not have one) for your post hospital discharge needs so that they can reassess your need for medications and monitor your lab values.  Total Time spent coordinating discharge including counseling, education and face to face time equals 35 minutes.  SignedOren Binet 07/28/2018 2:31 PM

## 2018-07-28 NOTE — Progress Notes (Signed)
Nutrition Follow-up  DOCUMENTATION CODES:   Obesity unspecified  INTERVENTON:   - Continue Boost Breeze po TID, each supplement provides 250 kcal and 9 grams of protein (orange flavor)  - Continue rena-vit daily  - Snacks BID between meals (RD ordered via [place])  - Provided brief education regarding nutrition therapy for ESRD on HD  NUTRITION DIAGNOSIS:   Inadequate oral intake related to acute illness as evidenced by estimated needs.  Progressing  GOAL:   Patient will meet greater than or equal to 90% of their needs  Progressing  MONITOR:   PO intake, Supplement acceptance, Weight trends, Labs, I & O's  REASON FOR ASSESSMENT:   Malnutrition Screening Tool    ASSESSMENT:   83 yo female, admitted with symptomatic anemia. PMH significant for HTN, chronic combined systolic and diastolic CHF, CKD IV, COPD, GERD, HLD.  Pt now with ESRD requiring HD.  1/22 - s/p R IJ TDC placement, s/p L AV fistula, started HD  Pre-HD weight today 78.1 kg. Post-HD weight 74.5 kg. Weight overall down 6 kg since admission.  Spoke with pt at bedside who reports having "pain all over" and being exhausted from HD. Pt reports that her appetite is improving and states that she occasionally feels "empty" during the day and during HD. Pt requesting snacks between meals. RD to order.  Pt also reports that she had half of a sausage and egg English muffin this morning for breakfast.  Pt drinking orange Boost Breeze at time of RD visit. Pt states that she is enjoying it and drinking it when it is offered. Pt would like to continue receiving Boost Breeze.  Discussed importance of adequate intake with pt and provided brief nutrition education regarding nutrition therapy for ESRD on HD.  Meal Completion: 50-100% x last 5 recorded meals  Medications reviewed and include: Sensipar, Boost Breeze TID, rena-vit, Protonix, Renvela  Labs reviewed: sodium 130 (L), hemoglobin 8.7  (L)  Diet Order:   Diet Order            Diet renal with fluid restriction Fluid restriction: 1200 mL Fluid; Room service appropriate? Yes; Fluid consistency: Thin  Diet effective now              EDUCATION NEEDS:   Education needs have been addressed  Skin:  Skin Assessment: Skin Integrity Issues: Incisions: L arm, R neck  Last BM:  1/26  Height:   Ht Readings from Last 1 Encounters:  07/24/18 5\' 3"  (1.6 m)    Weight:   Wt Readings from Last 1 Encounters:  07/28/18 74.5 kg    Ideal Body Weight:  52.3 kg  BMI:  Body mass index is 29.09 kg/m.  Estimated Nutritional Needs:   Kcal:  2706-2376 calories daily (28-32 kcal/kg IBW)  Protein:  79-102 gm daily (1.0-1.3 g/kg ABW)  Fluid:  1200 mL    Gaynell Face, MS, RD, LDN Inpatient Clinical Dietitian Pager: [phone] Weekend/After Hours: [phone]

## 2018-07-28 NOTE — Procedures (Signed)
Patient seen on Hemodialysis. BP (!) 166/57   Pulse 62   Temp 98.5 F (36.9 C) (Oral)   Resp 18   Ht 5\' 3"  (1.6 m)   Wt 78.1 kg   SpO2 98%   BMI 30.50 kg/m   QB 350, UF goal 2.5L Tolerating treatment without complaints at this time- ankle pain better overnight. Awaiting HD unit placement.   Elmarie Shiley MD [facility]. Office # [phone] Pager # [phone] 9:33 AM

## 2018-07-28 NOTE — Progress Notes (Signed)
Accepted at [facility] [address] . 1st treatment Thursday ,January 30,2020 at 11:00 am .Schedule and chair time Tuesday,Thursday,Saturday at 11:555 am .

## 2018-08-06 ENCOUNTER — Other Ambulatory Visit: Payer: Self-pay

## 2018-08-06 DIAGNOSIS — N179 Acute kidney failure, unspecified: Secondary | ICD-10-CM

## 2018-08-19 ENCOUNTER — Ambulatory Visit: Payer: Medicare Other | Admitting: Internal Medicine

## 2018-08-22 ENCOUNTER — Ambulatory Visit: Payer: Medicare Other | Admitting: Internal Medicine

## 2018-08-25 ENCOUNTER — Encounter: Payer: Self-pay | Admitting: Internal Medicine

## 2018-08-25 ENCOUNTER — Ambulatory Visit: Payer: Medicare Other | Admitting: Internal Medicine

## 2018-08-25 DIAGNOSIS — J9611 Chronic respiratory failure with hypoxia: Secondary | ICD-10-CM

## 2018-08-25 DIAGNOSIS — J449 Chronic obstructive pulmonary disease, unspecified: Secondary | ICD-10-CM | POA: Diagnosis not present

## 2018-08-25 NOTE — Patient Instructions (Signed)
No change in medications.  The goal for your oxygenation is to keep it above 90% while walking - ok to adjust your settings   Please schedule a follow up visit in 6 months but call sooner if needed

## 2018-08-25 NOTE — Progress Notes (Signed)
Subjective:    Patient ID: Mallory Chavez, female    DOB: 12-Feb-1936 MRN: [id number]  Brief patient profile:  12 yowbf quit smoking around 1994 bothered by some seasonal rhinitis spring > fall x around 1990 then 2012 developed breathing problems dx asthma variably responsive to rx referred 10/01/2012 to pulmonary clinic by Dr Jeanie Cooks and proved to have GOLD II COPD 10/2012    History of Present Illness  10/01/2012 1st pulmonary /[facility]  On ACEi cc abruptly 2012 shortness of breath assoc with cough min productive mostly clear. Sob x across a parking lot. Not responding to multiple asthma meds. rec benicar 40/25 one half daily in place of lisnopril GERD diet    04/19/2014 acute  ov/[facility]ert re: GOLD II  copd/ no med calendar/ not really clear on how to use meds  Chief Complaint  Patient presents with  . Acute Visit    Pt c/o increased SOB and wheezing x 2 wks.  She is SOB mainly with exertion such as walking from room to room at home. She is using her proair 2-3 times per day.   baseline doe/fatigue [facility] parking/ [name] or [employer]  Now sob across room  Nose runs some, clariton helps  Only using symbicort once a day  Onset of worsening was indolent/progressive, some better p saba  >>Increase Symbicort  To 160 , and O2 at 4l/m with act , ONO ordered      02/06/2018  f/u ov/Wert re:  GOLD II/ 02 2lpm hs and prn daytime  Chief Complaint  Patient presents with  . Follow-up    Increased SOB x 1 month. She has been using her albuterol inhaler 2 x daily on average.   Dyspnea:  Uses hc parking / struggles to do [employer] on 2lpm. Gradually worse since weather turned hot  Cough: none  SABA use: as above  02: 2lpm does not titrate  rec No change in pulmonary medications Adjust your 02 to keep your saturations above 90% at all times Please schedule a follow up visit in 3 months but call sooner if needed  - add:  Echo needed to r/o cor pulmonale  - Echo 02/14/2018 - Left ventricle: The  cavity size was normal. Wall thickness was increased in a pattern of mild LVH. Systolic function was mildly reduced. The estimated ejection fraction was in the range of 45% to 50%. Diffuse hypokinesis. Doppler parameters are consistent with abnormal left ventricular relaxation (grade 1 diastolic dysfunction). - Aortic valve: Trileaflet; mildly thickened, mildly calcified leaflets. - Left atrium: The atrium was mildly dilated. - Pulmonary arteries: Systolic pressure was moderately increased. PA peak pressure: 48 mm Hg (S).     05/19/2018  f/u ov/Wert re:  GOLD II copd criteria/ 02 dep 2lpm 24/7  Chief Complaint  Patient presents with  . Follow-up    PFT's done today.  Breathing is unchanged since her last visit. She is using her proair inhaler 1-2 x per day on average.   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 This includes all medications from all doctors and over the counters    08/25/2018  f/u ov/Wert re:  GOLD II  Spirometry / 02 dep symb 160 2bid   Chief Complaint  Patient presents with  . Follow-up    Breathing has improved since the last visit. She is using her o2 less. She is using her albuterol inhaler 3-4 x per wk on average.    Dyspnea:  [employer] with 02 set in one liter but not checking sats  Cough: none   SABA use: as above  02: 1lpm hs    No obvious day to day or daytime variability or assoc excess/ purulent sputum or mucus plugs  or hemoptysis or cp or chest tightness, subjective wheeze or overt sinus or hb symptoms.   Sleep on 2 bricks/ one pillow without nocturnal  or early am exacerbation  of respiratory  c/o's or need for noct saba. Also denies any obvious fluctuation of symptoms with weather or environmental changes or other aggravating or alleviating factors except as outlined above   No unusual exposure hx or h/o childhood pna/ asthma or knowledge of premature birth.  Current Allergies, Complete Past Medical History, Past Surgical History, Family History, and Social History were reviewed in [employer] record.  ROS  The following are not active complaints unless bolded Hoarseness, sore throat, dysphagia, dental problems, itching, sneezing,  nasal congestion or discharge of excess mucus or purulent secretions, ear ache,   fever, chills, sweats, unintended wt loss or wt gain, classically pleuritic or exertional cp,  orthopnea pnd or arm/hand swelling  or leg swelling, presyncope, palpitations, abdominal pain, anorexia, nausea, vomiting, diarrhea  or change in bowel habits or change in bladder habits, change in stools or change in urine, dysuria, hematuria,  rash, arthralgias, visual complaints, headache, numbness, weakness or ataxia or problems with walking or coordination,  change in mood or  memory.        Current Meds  Medication Sig  . amLODipine (NORVASC) 10 MG tablet Take 10 mg by mouth daily after supper.   Marland Kitchen aspirin EC 81 MG tablet Take 81 mg by mouth daily after supper.  . budesonide-formoterol (SYMBICORT) 160-4.5 MCG/ACT inhaler Inhale 2 puffs into the lungs 2 (two) times daily.  . cinacalcet (SENSIPAR) 30 MG tablet Take 30 mg by mouth daily.  . colchicine 0.6 MG tablet Take 1 tablet (0.6 mg total) by mouth every 3 (three) days.  Marland Kitchen loratadine (CLARITIN) 10 MG tablet Take 10 mg by mouth daily after supper.   . OXYGEN Inhale 2 L into the lungs See admin instructions. 2lpm with sleep and  exertion  [facility]   . PROAIR HFA 108 (90 Base) MCG/ACT inhaler INHALE 2 PUFFS EVERY 4 HOURS IF NEEDED WHEEZING SHORTNESS OF BREATH (Patient taking differently: Inhale 2 puffs into the lungs every 4 (four) hours as needed for wheezing or shortness of breath. )  . sevelamer carbonate (RENVELA) 800 MG tablet Take 3 tablets (2,400 mg total) by mouth 3 (three) times daily with meals.             Objective:   Physical Exam  11/10/2012       206 vs 12/19/2012 206  Vs 207 01/16/2013 > 04/27/2013  210 > 06/15/2013  208  >211 07/27/2013 > 04/19/14 201 >203 05/10/2014 > 07/05/2014 201 >10/07/2014  200>  01/10/2015   195 > 09/13/2015  193 > 03/15/2016  192  > 12/20/2016 185 > 08/11/2017  189 > 02/06/2018  186 >  05/19/2018  177 > 08/25/2018   170    amb bf nad   Vital signs reviewed - Note on arrival 02 sats  92% on RA       HEENT: upper and lower partial dentures/ nl  oropharynx. Nl external ear canals without cough reflex -  Mild bilateral non-specific turbinate edema     NECK :  without JVD/Nodes/TM/ nl carotid upstrokes bilaterally   LUNGS: no acc muscle use,  Mod barrel  contour chest wall with bilateral  Distant bs s audible wheeze and  without cough on insp or exp maneuver and mod  Hyperresonant  to  percussion bilaterally     CV:  RRR  no s3 or murmur or increase in P2, and no edema   ABD:  soft and nontender with pos mid  insp Hoover's  in the supine position. No bruits or organomegaly appreciated, bowel sounds nl  MS:   Nl gait/  ext warm without deformities, calf tenderness, cyanosis or clubbing No obvious joint restrictions   SKIN: warm and dry without lesions    NEURO:  alert, approp, nl sensorium with  no motor or cerebellar deficits apparent.                   Assessment & Plan:

## 2018-08-31 ENCOUNTER — Encounter: Payer: Self-pay | Admitting: Internal Medicine

## 2018-08-31 NOTE — Assessment & Plan Note (Addendum)
Quit smoking  1994 - PFT's 11/10/2012 FEV1 0.85 (52%)ratio 56 and no better p B 2 with DLCO 34 corrects to 60 - doe x 5-10 min stationery bike > add tudorza 11/10/12 > d/c 05/2013 and no change - PFT's  05/19/2018  FEV1 0.85 (56 % ) ratio 58  p 8 % improvement from saba p symb  prior to study with DLCO  27 % corrects to 47   % for alv volume  And ERV = 0.22  - 08/25/2018  After extensive coaching inhaler device,  effectiveness =    75% (short ti)    Adequate control on present rx, reviewed in detail with pt > no change in rx needed  As no symptomatic  response to lama previously and no tendency to aecopd

## 2018-08-31 NOTE — Assessment & Plan Note (Signed)
Onset 2015 04/19/14 walked one lap dropped to 80% required 4lpm with walking to maintain sats  ono RA 04/21/14 desat x 7 h x 12 m at < 89%  04/26/2014 rec 2lpm and repeat ono on 2lpm  - 01/10/2015  Walked 2lpmx 2 laps @ 185 ft each stopped due to hip pain, slow pace/ no sob or desat  -Patient Saturations on Room Air at Rest = 95% Patient Saturations on [employer] while Ambulating = 87% Patient Saturations on 2 Liters of oxygen while Ambulating = 97% - 05/19/2018   Walked 2lpm POC x one lap @ 210 ft  stopped due to sob s desats      As of 08/25/2018  rec is for 02 2lpm hs and prn daytime to keep sats > 90%    I had an extended discussion with the patient reviewing all relevant studies completed to date and  lasting 10 minutes of a  15 minute visit    See device teaching which extended face to face time for this visit.  Each maintenance medication was reviewed in detail including emphasizing most importantly the difference between maintenance and prns and under what circumstances the prns are to be triggered using an action plan format that is not reflected in the computer generated alphabetically organized AVS which I have not found useful in most complex patients, especially with respiratory illnesses  Please see AVS for specific instructions unique to this visit that I personally wrote and verbalized to the the pt in detail and then reviewed with pt  by my nurse highlighting any  changes in therapy recommended at today's visit to their plan of care.

## 2018-09-05 ENCOUNTER — Encounter: Payer: Self-pay | Admitting: Vascular Surgery

## 2018-09-05 ENCOUNTER — Ambulatory Visit (HOSPITAL_COMMUNITY)
Admit: 2018-09-05 | Discharge: 2018-09-05 | Disposition: A | Discharge status: Home / Self Care | Payer: Medicare Other | Attending: Vascular Surgery | Admitting: Vascular Surgery

## 2018-09-05 ENCOUNTER — Other Ambulatory Visit: Payer: Self-pay

## 2018-09-05 ENCOUNTER — Ambulatory Visit (INDEPENDENT_AMBULATORY_CARE_PROVIDER_SITE_OTHER): Payer: Medicare Other | Admitting: Vascular Surgery

## 2018-09-05 VITALS — BP 144/76 | HR 93 | Temp 97.3°F | Resp 20 | Ht 63.0 in | Wt 170.0 lb

## 2018-09-05 DIAGNOSIS — Z992 Dependence on renal dialysis: Secondary | ICD-10-CM

## 2018-09-05 DIAGNOSIS — N186 End stage renal disease: Secondary | ICD-10-CM

## 2018-09-05 DIAGNOSIS — N179 Acute kidney failure, unspecified: Secondary | ICD-10-CM | POA: Diagnosis not present

## 2018-09-05 NOTE — Progress Notes (Signed)
    Subjective:     Patient ID: JENI DULING, female   DOB: 1936/01/22, 83 y.o.   MRN: [id number]  HPI 83 year old female status post left arm brachiocephalic fistula.  Currently on dialysis via catheter.  Wound is healed well.  Left hand feeling well without issues.  She does not have complaints related to today's visit.   Review of Systems No complaints    Objective:   Physical Exam  Vitals:   09/05/18 1036  BP: (!) 144/76  Pulse: 93  Resp: 20  Temp: (!) 97.3 F (36.3 C)  SpO2: 95%   Awake alert oriented Nonlabored respirations Left arm with strong thrill in the upper arm and vein feels torturous Palpable left radial pulse   I have independently interpreted her dialysis duplex which demonstrates flow volume 630 mL/min with diameter up to 0.65 cm.    Assessment/plan     83 year old female status post placement left upper extremity AV fistula.  This appears to be maturing well not ready yet.  Will give another 6 weeks prior to sticking.  If at that time he cannot be cannulated given tortuosity or low flow we can perform fistulogram.  She can see me on an as-needed basis otherwise.        Kamorie Aldous C. Donzetta Matters, MD [facility] Office: [phone] Pager: [phone]

## 2018-09-15 ENCOUNTER — Other Ambulatory Visit: Payer: Self-pay | Admitting: Podiatry

## 2018-09-15 ENCOUNTER — Other Ambulatory Visit: Payer: Self-pay

## 2018-09-15 ENCOUNTER — Ambulatory Visit (INDEPENDENT_AMBULATORY_CARE_PROVIDER_SITE_OTHER): Payer: Medicare Other

## 2018-09-15 ENCOUNTER — Ambulatory Visit: Payer: Medicare Other | Admitting: Podiatry

## 2018-09-15 ENCOUNTER — Encounter: Payer: Self-pay | Admitting: Podiatry

## 2018-09-15 VITALS — BP 133/72 | HR 104

## 2018-09-15 DIAGNOSIS — M659 Synovitis and tenosynovitis, unspecified: Secondary | ICD-10-CM

## 2018-09-15 DIAGNOSIS — M25571 Pain in right ankle and joints of right foot: Secondary | ICD-10-CM

## 2018-09-15 DIAGNOSIS — M65971 Unspecified synovitis and tenosynovitis, right ankle and foot: Secondary | ICD-10-CM

## 2018-09-15 DIAGNOSIS — M76821 Posterior tibial tendinitis, right leg: Secondary | ICD-10-CM

## 2018-09-15 DIAGNOSIS — M79671 Pain in right foot: Secondary | ICD-10-CM

## 2018-09-17 NOTE — Progress Notes (Signed)
    HPI: 83 year old female presenting today as a new patient with a chief complaint of pain to the medial and lateral aspects of the right ankle that began about one year ago. She reports associated swelling. Walking increases the pain. She has been taking OTC Aleve for treatment. She denies any known trauma or injury. Patient is here for further evaluation and treatment.  Past Medical History:  Diagnosis Date  . Acute renal failure (ARF) ([place]) 06/15/2016  . Allergy   . Arthritis   . Asthma   . CHF (congestive heart failure) ([place]) 1980   controlled with meds  . Chronic respiratory failure ([place])   . CKD (chronic kidney disease)   . COPD (chronic obstructive pulmonary disease) ([place])   . GERD (gastroesophageal reflux disease)   . Hyperglycemia   . Hyperlipidemia   . Hypertension   . Hyponatremia        Physical Exam: General: The patient is alert and oriented x3 in no acute distress.  Dermatology: Skin is warm, dry and supple bilateral lower extremities. Negative for open lesions or macerations.  Vascular: Palpable pedal pulses bilaterally. No edema or erythema noted. Capillary refill within normal limits.  Neurological: Epicritic and protective threshold grossly intact bilaterally.   Musculoskeletal Exam: Pain on palpation noted to the posterior tibial tendon of the right foot as well as the lateral aspect of the right ankle. Range of motion within normal limits. Muscle strength 5/5 in all muscle groups bilateral lower extremities.  Radiographic Exam:  Normal osseous mineralization. Joint spaces preserved. No fracture or dislocation identified.    Assessment: 1. Posterior tibial tendinitis right 2. Lateral right ankle synovitis    Plan of Care:  1. Patient was evaluated. Radiographs were reviewed today. 2. Declined injections.  3. Compression anklet dispensed.  4. Continue taking OTC Aleve.  5. Recommended good shoe gear.  6. Return to clinic in 3 weeks. If not better,  she will consider injections.    Edrick Kins, DPM [facility]  Dr. Edrick Kins, [place]                                        [place], [place] [address]                Office [phone]  Fax [phone]

## 2018-10-06 ENCOUNTER — Encounter: Payer: Self-pay | Admitting: Podiatry

## 2018-10-06 ENCOUNTER — Other Ambulatory Visit: Payer: Self-pay

## 2018-10-06 ENCOUNTER — Ambulatory Visit: Payer: Medicare Other | Admitting: Podiatry

## 2018-10-06 DIAGNOSIS — G5791 Unspecified mononeuropathy of right lower limb: Secondary | ICD-10-CM | POA: Diagnosis not present

## 2018-10-06 DIAGNOSIS — M5431 Sciatica, right side: Secondary | ICD-10-CM

## 2018-10-06 MED ORDER — GABAPENTIN 100 MG PO CAPS: 100.0000 mg | ORAL_CAPSULE | Freq: Every day | ORAL | 1 refills | Status: DC

## 2018-10-06 MED ORDER — CYCLOBENZAPRINE HCL 5 MG PO TABS: 5.0000 mg | ORAL_TABLET | Freq: Every day | ORAL | 1 refills | Status: DC

## 2018-10-06 NOTE — Progress Notes (Signed)
   HPI: 83 year old female presents the office today for follow-up evaluation regarding right lower extremity pain.  Patient has a significant amount of pain to the right lower extremity she was diagnosed last visit with posterior tibial tendinitis as well as right lateral ankle synovitis.  Today she states that the pain extends proximal up to her hip.  She notices buttocks pain as well as thigh pain especially at night when she sleeps.  Minimal symptoms during the day.  She has been taking over-the-counter Aleve with minimal relief.  She presents for further treatment evaluation  Past Medical History:  Diagnosis Date  . Acute renal failure (ARF) ([place]) 06/15/2016  . Allergy   . Arthritis   . Asthma   . CHF (congestive heart failure) ([place]) 1980   controlled with meds  . Chronic respiratory failure ([place])   . CKD (chronic kidney disease)   . COPD (chronic obstructive pulmonary disease) ([place])   . GERD (gastroesophageal reflux disease)   . Hyperglycemia   . Hyperlipidemia   . Hypertension   . Hyponatremia      Physical Exam: General: The patient is alert and oriented x3 in no acute distress.  Dermatology: Skin is warm, dry and supple bilateral lower extremities. Negative for open lesions or macerations.  Vascular: Palpable pedal pulses bilaterally. No edema or erythema noted. Capillary refill within normal limits.  Neurological: Epicritic and protective threshold grossly intact bilaterally.   Musculoskeletal Exam: Range of motion within normal limits to all pedal and ankle joints bilateral. Muscle strength 5/5 in all groups bilateral.  There continues to be some pain on palpation along the posterior tibial tendon and lateral aspect of the right ankle   Assessment: 1.  Suspected sciatica right lower extremity 2.  Posterior tibial tendinitis 3.  Right ankle synovitis   Plan of Care:  1. Patient evaluated. 2.  Based on the patient's discomfort while sleeping and pain that extends all  the way up to the entire right lower extremity I do believe her symptoms are more sciatica related than it is posterior tibial tendon or directly related to the ankle. 3.  Prescription for gabapentin 100 mg nightly 4.  Prescription for Flexeril 5 mg nightly 5.  Return to clinic in 4 weeks      Edrick Kins, DPM [facility]  Dr. Edrick Kins, DPM    [address], [place] [address]                Office [phone]  Fax [phone]

## 2018-11-10 ENCOUNTER — Encounter: Payer: Self-pay | Admitting: Podiatry

## 2018-11-10 ENCOUNTER — Other Ambulatory Visit: Payer: Self-pay

## 2018-11-10 ENCOUNTER — Ambulatory Visit: Payer: Medicare Other | Admitting: Podiatry

## 2018-11-10 VITALS — Temp 96.6°F

## 2018-11-10 DIAGNOSIS — G5791 Unspecified mononeuropathy of right lower limb: Secondary | ICD-10-CM

## 2018-11-10 DIAGNOSIS — M5431 Sciatica, right side: Secondary | ICD-10-CM | POA: Diagnosis not present

## 2018-11-10 DIAGNOSIS — M76821 Posterior tibial tendinitis, right leg: Secondary | ICD-10-CM

## 2018-11-10 MED ORDER — GABAPENTIN 100 MG PO CAPS: 100.0000 mg | ORAL_CAPSULE | Freq: Every day | ORAL | 2 refills | Status: DC

## 2018-11-10 MED ORDER — CYCLOBENZAPRINE HCL 5 MG PO TABS: 5.0000 mg | ORAL_TABLET | Freq: Every day | ORAL | 2 refills | Status: DC

## 2018-11-13 NOTE — Progress Notes (Signed)
   HPI: 83 year old female presents the office today for follow-up evaluation regarding right lower extremity pain. She states the right foot and ankle pain has improved significantly. She has been taking Gabapentin and Flexeril as directed. There are no worsening factors noted at this time. Patient is here for further evaluation and treatment.   Past Medical History:  Diagnosis Date  . Acute renal failure (ARF) ([place]) 06/15/2016  . Allergy   . Arthritis   . Asthma   . CHF (congestive heart failure) ([place]) 1980   controlled with meds  . Chronic respiratory failure ([place])   . CKD (chronic kidney disease)   . COPD (chronic obstructive pulmonary disease) ([place])   . GERD (gastroesophageal reflux disease)   . Hyperglycemia   . Hyperlipidemia   . Hypertension   . Hyponatremia      Physical Exam: General: The patient is alert and oriented x3 in no acute distress.  Dermatology: Skin is warm, dry and supple bilateral lower extremities. Negative for open lesions or macerations.  Vascular: Palpable pedal pulses bilaterally. No edema or erythema noted. Capillary refill within normal limits.  Neurological: Epicritic and protective threshold grossly intact bilaterally.   Musculoskeletal Exam: Range of motion within normal limits to all pedal and ankle joints bilateral. Muscle strength 5/5 in all groups bilateral.    Assessment: 1.  Suspected sciatica right lower extremity 2.  Posterior tibial tendinitis - resolved  3.  Right ankle synovitis - resolved    Plan of Care:  1. Patient evaluated. 2. Refill prescription for Gabapentin 100 mg QHS provided to patient.  3. Refill prescription for Flexeril 5 mg QHS provided to patient.  4. Recommended referral to ortho specialist for right hip pain.  5. Return to clinic as needed.       Edrick Kins, DPM [facility]  Dr. Edrick Kins, DPM    [address], [place] [address]                 Office [phone]  Fax [phone]

## 2018-12-14 LAB — PULMONARY FUNCTION TEST
DL/VA % pred: 60 %
DL/VA: 2.84 ml/min/mmHg/L
DLCO UNC % PRED: 34 %
DLCO unc: 7.89 ml/min/mmHg
FEF 25-75 POST: 0.33 L/s
FEF 25-75 Pre: 0.36 L/sec
FEF2575-%CHANGE-POST: -10 %
FEF2575-%PRED-POST: 22 %
FEF2575-%Pred-Pre: 24 %
FEV1-%Change-Post: -2 %
FEV1-%PRED-POST: 51 %
FEV1-%PRED-PRE: 52 %
FEV1-POST: 0.83 L
FEV1-Pre: 0.85 L
FEV1FVC-%Change-Post: 7 %
FEV1FVC-%PRED-PRE: 73 %
FEV6-%Change-Post: -6 %
FEV6-%PRED-POST: 67 %
FEV6-%Pred-Pre: 72 %
FEV6-PRE: 1.47 L
FEV6-Post: 1.37 L
FEV6FVC-%Change-Post: 2 %
FEV6FVC-%PRED-PRE: 101 %
FEV6FVC-%Pred-Post: 104 %
FVC-%Change-Post: -9 %
FVC-%PRED-POST: 65 %
FVC-%Pred-Pre: 71 %
FVC-POST: 1.37 L
POST FEV1/FVC RATIO: 61 %
Post FEV6/FVC ratio: 100 %
Pre FEV1/FVC ratio: 56 %
Pre FEV6/FVC Ratio: 97 %

## 2019-02-23 ENCOUNTER — Ambulatory Visit (INDEPENDENT_AMBULATORY_CARE_PROVIDER_SITE_OTHER): Payer: Medicare Other | Admitting: Internal Medicine

## 2019-02-23 ENCOUNTER — Encounter: Payer: Self-pay | Admitting: Internal Medicine

## 2019-02-23 ENCOUNTER — Other Ambulatory Visit: Payer: Self-pay

## 2019-02-23 DIAGNOSIS — J9611 Chronic respiratory failure with hypoxia: Secondary | ICD-10-CM

## 2019-02-23 DIAGNOSIS — J449 Chronic obstructive pulmonary disease, unspecified: Secondary | ICD-10-CM

## 2019-02-23 NOTE — Patient Instructions (Signed)
No change recommendations  Check your oxygen level with activity to be sure it stays above 90%   Please schedule a follow up visit in 3 months but call sooner if needed 6 months

## 2019-02-23 NOTE — Progress Notes (Signed)
Subjective:   Patient ID: Mallory Chavez, female    DOB: 30-Nov-1935 MRN: [id number]    Brief patient profile:  77 yowbf quit smoking around 1994 bothered by some seasonal rhinitis spring > fall x around 1990 then 2012 developed breathing problems dx asthma variably responsive to rx referred 10/01/2012 to pulmonary clinic by Dr Jeanie Cooks and proved to have GOLD II COPD 10/2012    History of Present Illness  10/01/2012 1st pulmonary /Mallory Chavez  On ACEi cc abruptly 2012 shortness of breath assoc with cough min productive mostly clear. Sob x across a parking lot. Not responding to multiple asthma meds. rec benicar 40/25 one half daily in place of lisnopril GERD diet    04/19/2014 acute  ov/Mallory Chavez re: GOLD II  copd/ no med calendar/ not really clear on how to use meds  Chief Complaint  Patient presents with  . Acute Visit    Pt c/o increased SOB and wheezing x 2 wks.  She is SOB mainly with exertion such as walking from room to room at home. She is using her proair 2-3 times per day.   baseline doe/fatigue [employer] parking/ [name] or [employer]  Now sob across room  Nose runs some, clariton helps  Only using symbicort once a day  Onset of worsening was indolent/progressive, some better p saba  >>Increase Symbicort  To 160 , and O2 at 4l/m with act , ONO ordered      02/06/2018  f/u ov/Mallory Chavez re:  GOLD II/ 02 2lpm hs and prn daytime  Chief Complaint  Patient presents with  . Follow-up    Increased SOB x 1 month. She has been using her albuterol inhaler 2 x daily on average.   Dyspnea:  Uses [employer] parking / struggles to do [employer] on 2lpm. Gradually worse since weather turned hot  Cough: none  SABA use: as above  02: 2lpm does not titrate  rec No change in pulmonary medications Adjust your 02 to keep your saturations above 90% at all times Please schedule a follow up visit in 3 months but call sooner if needed  - add:  Echo needed to r/o cor pulmonale  - Echo 02/14/2018 - Left ventricle: The  cavity size was normal. Wall thickness was increased in a pattern of mild LVH. Systolic function was mildly reduced. The estimated ejection fraction was in the range of 45% to 50%. Diffuse hypokinesis. Doppler parameters are consistent with abnormal left ventricular relaxation (grade 1 diastolic dysfunction). - Aortic valve: Trileaflet; mildly thickened, mildly calcified leaflets. - Left atrium: The atrium was mildly dilated. - Pulmonary arteries: Systolic pressure was moderately increased. PA peak pressure: 48 mm Hg (S).     05/19/2018  f/u ov/Mallory Chavez re:  GOLD II copd criteria/ 02 dep 2lpm 24/7  Chief Complaint  Patient presents with  . Follow-up    PFT's done today.  Breathing is unchanged since her last visit. She is using her proair inhaler 1-2 x per day on average.   Dyspnea:  Much better on 02 and able to do housekeeping/walk at [facility] or [employer], but struggles to get in to buildings carrying 02 across parking lot despite adequate sats on POC Cough: none Sleeping: R side/ bricks under headboard  SABA use: as above 02: as above   rec Plan A = Automatic =  symb 160 2bid  Work on [employer]  Plan B = Backup Only use your albuterol inhaler  If needed  Please schedule a  follow up visit in 3 months but call sooner if needed  with all medications /inhalers/ solutions in hand so we can verify exactly what you are taking. This includes all medications from all doctors and over the counters    08/25/2018  f/u ov/Mallory Chavez re:  GOLD II  Spirometry / 02 dep symb 160 2bid   Chief Complaint  Patient presents with  . Follow-up    Breathing has improved since the last visit. She is using her o2 less. She is using her albuterol inhaler 3-4 x per wk on average.    Dyspnea:  [employer] with 02 set in one liter but not checking sats  Cough: none  SABA use: as above  02: 1lpm hs  Sleep on 2 bricks rec No change in medications. The goal for your oxygenation is to  keep it above 90% while walking - ok to adjust your settings    02/23/2019  f/u ov/Mallory Chavez re:  GOLD II spiromnetry / 02 desp hs / maint on symb 160 2bid  Chief Complaint  Patient presents with  . Follow-up    Breathing is overall doing well. She is using her rescue inhaler once daily on average.   Dyspnea:  Still doing [employer] s 02  Cough: no Sleeping: ok 2 blocks/ some insomnia  SABA use: once daily when exerting never at rest or noct  02: 1lpm hs and using 1lpm with housecleaning only    No obvious day to day or daytime variability or assoc excess/ purulent sputum or mucus plugs or hemoptysis or cp or chest tightness, subjective wheeze or overt sinus or hb symptoms.   Sleeping as above  without nocturnal  or early am exacerbation  of respiratory  c/o's or need for noct saba. Also denies any obvious fluctuation of symptoms with weather or environmental changes or other aggravating or alleviating factors except as outlined above   No unusual exposure hx or h/o childhood pna/ asthma or knowledge of premature birth.  Current Allergies, Complete Past Medical History, Past Surgical History, Family History, and Social History were reviewed in [employer] record.  ROS  The following are not active complaints unless bolded Hoarseness, sore throat, dysphagia, dental problems, itching, sneezing,  nasal congestion or discharge of excess mucus or purulent secretions, ear ache,   fever, chills, sweats, unintended wt loss or wt gain, classically pleuritic or exertional cp,  orthopnea pnd or arm/hand swelling  or leg swelling, presyncope, palpitations, abdominal pain, anorexia, nausea, vomiting, diarrhea  or change in bowel habits or change in bladder habits, change in stools or change in urine, dysuria, hematuria,  rash, arthralgias, visual complaints, headache, numbness, weakness or ataxia or problems with walking or coordination,  change in mood or  memory.        Current Meds   Medication Sig  . amLODipine (NORVASC) 10 MG tablet Take 10 mg by mouth daily after supper.   Marland Kitchen aspirin EC 81 MG tablet Take 81 mg by mouth daily after supper.  . budesonide-formoterol (SYMBICORT) 160-4.5 MCG/ACT inhaler Inhale 2 puffs into the lungs 2 (two) times daily.  . cinacalcet (SENSIPAR) 30 MG tablet Take 30 mg by mouth daily.  Marland Kitchen loratadine (CLARITIN) 10 MG tablet Take 10 mg by mouth daily after supper.   . multivitamin (RENA-VIT) TABS tablet TAKE 1 TABLET BY MOUTH EVERY DAY (ON DIALYSIS DAYS, TAKE AFTER DIALYSIS TREATMENT)  . OXYGEN Inhale 2 L into the lungs See admin instructions. 2lpm with sleep and exertion  [facility]   . pantoprazole (PROTONIX) 40 MG tablet TK 1 T PO QAM  . PROAIR HFA 108 (90 Base) MCG/ACT inhaler INHALE 2 PUFFS EVERY 4 HOURS IF NEEDED WHEEZING SHORTNESS OF BREATH (Patient taking differently: Inhale 2 puffs into the lungs every 4 (four) hours as needed for wheezing or shortness of breath. )  . sevelamer carbonate (RENVELA) 800 MG tablet Take 3 tablets (2,400 mg total) by mouth 3 (three) times daily with meals.                 Objective:   Physical Exam   02/23/2019  166  11/10/2012       206 vs 12/19/2012 206  Vs 207 01/16/2013 > 04/27/2013  210 > 06/15/2013  208  >211 07/27/2013 > 04/19/14 201 >203 05/10/2014 > 07/05/2014 201 >10/07/2014 200>  01/10/2015   195 > 09/13/2015  193 > 03/15/2016  192  > 12/20/2016 185 > 08/11/2017  189 > 02/06/2018  186 >  05/19/2018  177 > 08/25/2018   170    amb bf nad   Vital signs reviewed - Note  02 sats  97% on RA at rest    HEENT: nl dentition / oropharynx. Nl external ear canals without cough reflex -  Mild bilateral non-specific turbinate edema     NECK :  without JVD/Nodes/TM/ nl carotid upstrokes bilaterally   LUNGS: no acc muscle use,  Mod barrel  contour chest wall with bilateral  Distant bs s audible wheeze and  without cough on insp or exp maneuvers and mod  Hyperresonant  to  percussion bilaterally     CV:  RRR  no s3 or  murmur or increase in P2, and no edema   ABD:  soft and nontender with pos mid insp Hoover's  in the supine position. No bruits or organomegaly appreciated, bowel sounds nl  MS:  Slow gait    ext warm without deformities, calf tenderness, cyanosis or clubbing No obvious joint restrictions   SKIN: warm and dry without lesions    NEURO:  alert, approp, nl sensorium with  no motor or cerebellar deficits apparent.                             Assessment & Plan:

## 2019-02-24 ENCOUNTER — Encounter: Payer: Self-pay | Admitting: Internal Medicine

## 2019-02-24 ENCOUNTER — Telehealth: Payer: Self-pay | Admitting: *Deleted

## 2019-02-24 DIAGNOSIS — J9611 Chronic respiratory failure with hypoxia: Secondary | ICD-10-CM

## 2019-02-24 NOTE — Assessment & Plan Note (Signed)
Onset 2015 04/19/14 walked one lap dropped to 80% required 4lpm with walking to maintain sats  ono RA 04/21/14 desat x 7 h x 12 m at < 89%  04/26/2014 rec 2lpm and repeat ono on 2lpm  - 01/10/2015  Walked 2lpmx 2 laps @ 185 ft each stopped due to hip pain, slow pace/ no sob or desat  -Patient Saturations on Room Air at Rest = 95% Patient Saturations on [employer] while Ambulating = 87% Patient Saturations on 2 Liters of oxygen while Ambulating = 97% - 05/19/2018   Walked 2lpm POC x one lap @ 210 ft  stopped due to sob s desats     - 02/23/2019   Walked RA x one lap =  approx 250 ft - stopped due to sob with sats 84% at slow pace though RA sats at rest ok    As of 02/23/2019  rec is for 02 1lpm hs and prn daytime to keep sats > 90%   Will need ono on 1lpm to be sure this is adequate.    I had an extended discussion with the patient  reviewing all relevant studies completed to date and  lasting 15 to 20 minutes of a 25 minute visit  which included directly observing ambulatory 02 saturation study documented in a/p section of  today's  office note.  I performed device teaching  using a teach back technique which also  extended face to face time for this visit (see above)   Each maintenance medication was reviewed in detail including most importantly the difference between maintenance and prns and under what circumstances the prns are to be triggered using an action plan format that is not reflected in the computer generated alphabetically organized AVS.     Please see AVS for specific instructions unique to this visit that I personally wrote and verbalized to the the pt in detail and then reviewed with pt  by my nurse highlighting any changes in therapy recommended at today's visit .

## 2019-02-24 NOTE — Assessment & Plan Note (Signed)
Quit smoking  1994 - PFT's 11/10/2012 FEV1 0.85 (52%)ratio 56 and no better p B 2 with DLCO 34 corrects to 60 - doe x 5-10 min stationery bike > add tudorza 11/10/12 > d/c 05/2013 and no change - PFT's  05/19/2018  FEV1 0.85 (56 % ) ratio 58  p 8 % improvement from saba p symb  prior to study with DLCO  27 % corrects to 47   % for alv volume  And ERV = 0.22    - 02/23/2019  After extensive coaching inhaler device,  effectiveness =    75 % (short Ti)  Adequate control on present rx, reviewed in detail with pt > no change in rx needed  = symb 160 2bid

## 2019-02-24 NOTE — Telephone Encounter (Signed)
-----   Message from Tanda Rockers, MD sent at 02/24/2019  8:32 AM EDT ----- She needs overnight oximetry on 1 L/min to be sure that is enough oxygen for her.

## 2019-02-24 NOTE — Telephone Encounter (Signed)
Spoke with the pt and notified of recs per MW  ONO order was sent

## 2019-03-09 ENCOUNTER — Encounter: Payer: Self-pay | Admitting: Internal Medicine

## 2019-04-10 ENCOUNTER — Other Ambulatory Visit: Payer: Self-pay | Admitting: Internal Medicine

## 2019-05-25 ENCOUNTER — Ambulatory Visit: Payer: Medicare Other | Admitting: Internal Medicine

## 2019-06-03 ENCOUNTER — Ambulatory Visit (INDEPENDENT_AMBULATORY_CARE_PROVIDER_SITE_OTHER): Payer: Medicare Other | Admitting: Internal Medicine

## 2019-06-03 ENCOUNTER — Encounter: Payer: Self-pay | Admitting: Internal Medicine

## 2019-06-03 ENCOUNTER — Other Ambulatory Visit: Payer: Self-pay

## 2019-06-03 DIAGNOSIS — J449 Chronic obstructive pulmonary disease, unspecified: Secondary | ICD-10-CM | POA: Diagnosis not present

## 2019-06-03 DIAGNOSIS — J9611 Chronic respiratory failure with hypoxia: Secondary | ICD-10-CM | POA: Diagnosis not present

## 2019-06-03 NOTE — Patient Instructions (Addendum)
Try albuterol 15 min before an activity that you know would make you short of breath and see if it makes any difference and if makes none then don't take it after activity unless you can't catch your breath.   Goal for 02 is to keep to 02 sats over 100%

## 2019-06-03 NOTE — Assessment & Plan Note (Addendum)
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 2) If the pt is convinced, as many are, that saba helps recover from activity faster then it's easy to tell if this is the case by re-challenging : ie stop, take the inhaler, then p 5 minutes try the exact same activity (intensity of workload) that just caused the symptoms and see if they are substantially diminished or not after saba 3) if there is an activity that reproducibly causes the symptoms, try the saba 15 min before the activity on alternate days   If in fact the saba really does help, then fine to continue to use it prn but advised may need to look closer at the maintenance regimen being used to achieve better control of airways disease with exertion.   Pt informed of the seriousness of COVID 19 infection as a direct risk to their health  and safey and to those of their loved ones and should continue to wear facemask in public and minimize exposure to public locations but especially avoid any area or activity where non-close contacts are not observing  distancing or wearing an appropriate face mask.  >>>> put off regular f/u x 6 m, call sooner prn

## 2019-06-03 NOTE — Assessment & Plan Note (Signed)
Onset 2015 04/19/14 walked one lap dropped to 80% required 4lpm with walking to maintain sats  ono RA 04/21/14 desat x 7 h x 12 m at < 89%  04/26/2014 rec 2lpm and repeat ono on 2lpm  - 01/10/2015  Walked 2lpmx 2 laps @ 185 ft each stopped due to hip pain, slow pace/ no sob or desat  -Patient Saturations on Room Air at Rest = 95% Patient Saturations on [employer] while Ambulating = 87% Patient Saturations on 2 Liters of oxygen while Ambulating = 97% - 05/19/2018   Walked 2lpm POC x one lap @ 210 ft  stopped due to sob s desats   - 02/23/2019   Walked RA x one lap =  approx 250 ft - stopped due to sob with sats 84% at slow pace though RA sats at rest ok  - ONO on 1lpm  03/08/19  desat x 24 sec only > continue 1 lpm   rec 1lpm and with activity if sats not staying > 90%  Advised: Make sure you check your oxygen saturations at highest level of activity to be sure it stays over 90% and adjust upward to maintain this level if needed but remember to turn it back to previous settings when you stop (to conserve your supply).

## 2019-06-03 NOTE — Progress Notes (Signed)
Subjective:   Patient ID: Mallory Chavez, female    DOB: 07/29/1935 MRN: [id number]    Brief patient profile:  36 yowbf quit smoking around 1994 bothered by some seasonal rhinitis spring > fall x around 1990 then 2012 developed breathing problems dx asthma variably responsive to rx referred 10/01/2012 to pulmonary clinic by Dr Jeanie Cooks and proved to have GOLD II COPD 10/2012    History of Present Illness  10/01/2012 1st pulmonary /Marcelles Clinard  On ACEi cc abruptly 2012 shortness of breath assoc with cough min productive mostly clear. Sob x across a parking lot. Not responding to multiple asthma meds. rec benicar 40/25 one half daily in place of lisnopril GERD diet    04/19/2014 acute  ov/Raechal Raben re: GOLD II  copd/ no med calendar/ not really clear on how to use meds  Chief Complaint  Patient presents with  . Acute Visit    Pt c/o increased SOB and wheezing x 2 wks.  She is SOB mainly with exertion such as walking from room to room at home. She is using her proair 2-3 times per day.   baseline doe/fatigue [employer] parking/ [name] or [employer]  Now sob across room  Nose runs some, clariton helps  Only using symbicort once a day  Onset of worsening was indolent/progressive, some better p saba  >>Increase Symbicort  To 160 , and O2 at 4l/m with act , ONO ordered      02/06/2018  f/u ov/Cashus Halterman re:  GOLD II/ 02 2lpm hs and prn daytime  Chief Complaint  Patient presents with  . Follow-up    Increased SOB x 1 month. She has been using her albuterol inhaler 2 x daily on average.   Dyspnea:  Uses [employer] parking / struggles to do [employer] on 2lpm. Gradually worse since weather turned hot  Cough: none  SABA use: as above  02: 2lpm does not titrate  rec No change in pulmonary medications Adjust your 02 to keep your saturations above 90% at all times Please schedule a follow up visit in 3 months but call sooner if needed  - add:  Echo needed to r/o cor pulmonale  - Echo 02/14/2018 - Left ventricle: The  cavity size was normal. Wall thickness was increased in a pattern of mild LVH. Systolic function was mildly reduced. The estimated ejection fraction was in the range of 45% to 50%. Diffuse hypokinesis. Doppler parameters are consistent with abnormal left ventricular relaxation (grade 1 diastolic dysfunction). - Aortic valve: Trileaflet; mildly thickened, mildly calcified leaflets. - Left atrium: The atrium was mildly dilated. - Pulmonary arteries: Systolic pressure was moderately increased. PA peak pressure: 48 mm Hg (S).     05/19/2018  f/u ov/Aleni Andrus re:  GOLD II copd criteria/ 02 dep 2lpm 24/7  Chief Complaint  Patient presents with  . Follow-up    PFT's done today.  Breathing is unchanged since her last visit. She is using her proair inhaler 1-2 x per day on average.   Dyspnea:  Much better on 02 and able to do housekeeping/walk at [facility] or [employer], but struggles to get in to buildings carrying 02 across parking lot despite adequate sats on POC Cough: none Sleeping: R side/ bricks under headboard  SABA use: as above 02: as above   rec Plan A = Automatic =  symb 160 2bid  Work on Interior and spatial [employer]  Plan B = Backup Only use your albuterol inhaler  If needed  Please schedule a  follow up visit in 3 months but call sooner if needed  with all medications /inhalers/ solutions in hand so we can verify exactly what you are taking. This includes all medications from all doctors and over the counters    08/25/2018  f/u ov/Stormi Vandevelde re:  GOLD II  Spirometry / 02 dep symb 160 2bid   Chief Complaint  Patient presents with  . Follow-up    Breathing has improved since the last visit. She is using her o2 less. She is using her albuterol inhaler 3-4 x per wk on average.    Dyspnea:  [employer] with 02 set in one liter but not checking sats  Cough: none  SABA use: as above  02: 1lpm hs  Sleep on 2 bricks rec No change in medications. The goal for your oxygenation is to  keep it above 90% while walking - ok to adjust your settings    02/23/2019  f/u ov/Danissa Rundle re:  GOLD II spiromnetry / 02 desp hs / maint on symb 160 2bid  Chief Complaint  Patient presents with  . Follow-up    Breathing is overall doing well. She is using her rescue inhaler once daily on average.   Dyspnea:  Still doing [employer] s 02  Cough: no Sleeping: ok 2 blocks/ some insomnia  SABA use: once daily when exerting never at rest or noct  02: 1lpm hs and using 1lpm with housecleaning only  rec Check your oxygen level with activity to be sure it stays above 90%   Virtual Visit via Telephone Note 06/03/2019   I connected with Lezlie Lye on 06/03/19 at  8:25 AM EST by telephone and verified that I am speaking with the correct person using two identifiers.   I discussed the limitations, risks, security and privacy concerns of performing an evaluation and management service by telephone and the availability of in person appointments. I also discussed with the patient that there may be a patient responsible charge related to this service. The patient expressed understanding and agreed to proceed.   History of Present Illness: Dyspnea:  Still doing [employer] s 02 /rollator to door after dropoff by daughter  Cough: none  Sleeping: bed blocks/ no resp symptoms but uncomfortable due to arthritis SABA use: only p exertion >never prechallenges  02: not using much any more, sometimes with house work but only after gets sob Having cramps at end of HD and plans to mention this to renal 06/04/2019   No obvious day to day or daytime variability or assoc excess/ purulent sputum or mucus plugs or hemoptysis or cp or chest tightness, subjective wheeze or overt sinus or hb symptoms.    Also denies any obvious fluctuation of symptoms with weather or environmental changes or other aggravating or alleviating factors except as outlined above.   Meds reviewed/ med reconciliation completed         Observations/Objective: Sounds great on phone    Assessment and Plan: See problem list for active a/p's   Follow Up Instructions: See avs for instructions unique to this ov which includes revised/ updated med list     I discussed the assessment and treatment plan with the patient. The patient was provided an opportunity to ask questions and all were answered. The patient agreed with the plan and demonstrated an understanding of the instructions.   The patient was advised to call back or seek an in-person evaluation if the symptoms worsen or if the condition fails to improve as  anticipated.  I provided 25  minutes of non-face-to-face time during this encounter.   Christinia Gully, MD

## 2019-06-17 ENCOUNTER — Ambulatory Visit: Payer: Medicare Other | Admitting: Podiatry

## 2019-06-22 ENCOUNTER — Ambulatory Visit (INDEPENDENT_AMBULATORY_CARE_PROVIDER_SITE_OTHER): Payer: Medicare Other | Admitting: Podiatry

## 2019-06-22 ENCOUNTER — Other Ambulatory Visit: Payer: Self-pay

## 2019-06-22 DIAGNOSIS — M65971 Unspecified synovitis and tenosynovitis, right ankle and foot: Secondary | ICD-10-CM

## 2019-06-22 DIAGNOSIS — M659 Synovitis and tenosynovitis, unspecified: Secondary | ICD-10-CM | POA: Diagnosis not present

## 2019-06-22 DIAGNOSIS — M5431 Sciatica, right side: Secondary | ICD-10-CM | POA: Diagnosis not present

## 2019-06-22 MED ORDER — CYCLOBENZAPRINE HCL 5 MG PO TABS: 5.0000 mg | ORAL_TABLET | Freq: Every day | ORAL | 3 refills | Status: DC

## 2019-06-22 MED ORDER — GABAPENTIN 100 MG PO CAPS
100.0000 mg | ORAL_CAPSULE | Freq: Every day | ORAL | 1 refills | Status: DC
  Filled 2021-09-22: qty 90, 90d supply, fill #0
  Filled 2021-12-16: qty 90, 90d supply, fill #1

## 2019-06-28 NOTE — Progress Notes (Signed)
   HPI: 83 year old female presenting to the office today for follow up evaluation of pain to the lateral right ankle joint. She states she is now having a flare up and the pain is worse than it previously was. She states she has pain whenever she does not take the Gabapentin and Flexeril. Patient is here for further evaluation and treatment.   Past Medical History:  Diagnosis Date  . Acute renal failure (ARF) ([place]) 06/15/2016  . Allergy   . Arthritis   . Asthma   . CHF (congestive heart failure) ([place]) 1980   controlled with meds  . Chronic respiratory failure ([place])   . CKD (chronic kidney disease)   . COPD (chronic obstructive pulmonary disease) ([place])   . GERD (gastroesophageal reflux disease)   . Hyperglycemia   . Hyperlipidemia   . Hypertension   . Hyponatremia      Physical Exam: General: The patient is alert and oriented x3 in no acute distress.  Dermatology: Skin is warm, dry and supple bilateral lower extremities. Negative for open lesions or macerations.  Vascular: Palpable pedal pulses bilaterally. No edema or erythema noted. Capillary refill within normal limits.  Neurological: Epicritic and protective threshold grossly intact bilaterally.   Musculoskeletal Exam: Pain with palpation noted to the medial, anterior and lateral aspects of the right ankle joint. Range of motion within normal limits to all pedal and ankle joints bilateral. Muscle strength 5/5 in all groups bilateral.    Radiographic Exam:  Normal osseous mineralization. Joint spaces preserved. No fracture/dislocation/boney destruction.    Assessment: 1. Suspected sciatica right lower extremity 2. Right ankle synovitis - recurrent    Plan of Care:  1. Patient evaluated. X-Rays reviewed.  2. Refill prescription for Gabapentin 100 mg QHS provided to patient.  3. Refill prescription for Flexeril 5 mg QHS provided to patient.  4. Recommended referral to ortho specialist for right hip pain.  5. Injection  of 0.5 mLs Celestone Soluspan injected into the right ankle joint.  6. Return to clinic as needed.       Edrick Kins, DPM [facility]  Dr. Edrick Kins, DPM    [address], [place] [address]                Office [phone]  Fax [phone]

## 2019-07-01 IMAGING — DX DG CHEST 1V PORT
1 series · 1 of 1 positions shown · non-contrast
Comparison: 07/19/2018

CLINICAL DATA: Status post dialysis catheter insertion.

EXAM:
PORTABLE CHEST 1 VIEW

[chest]
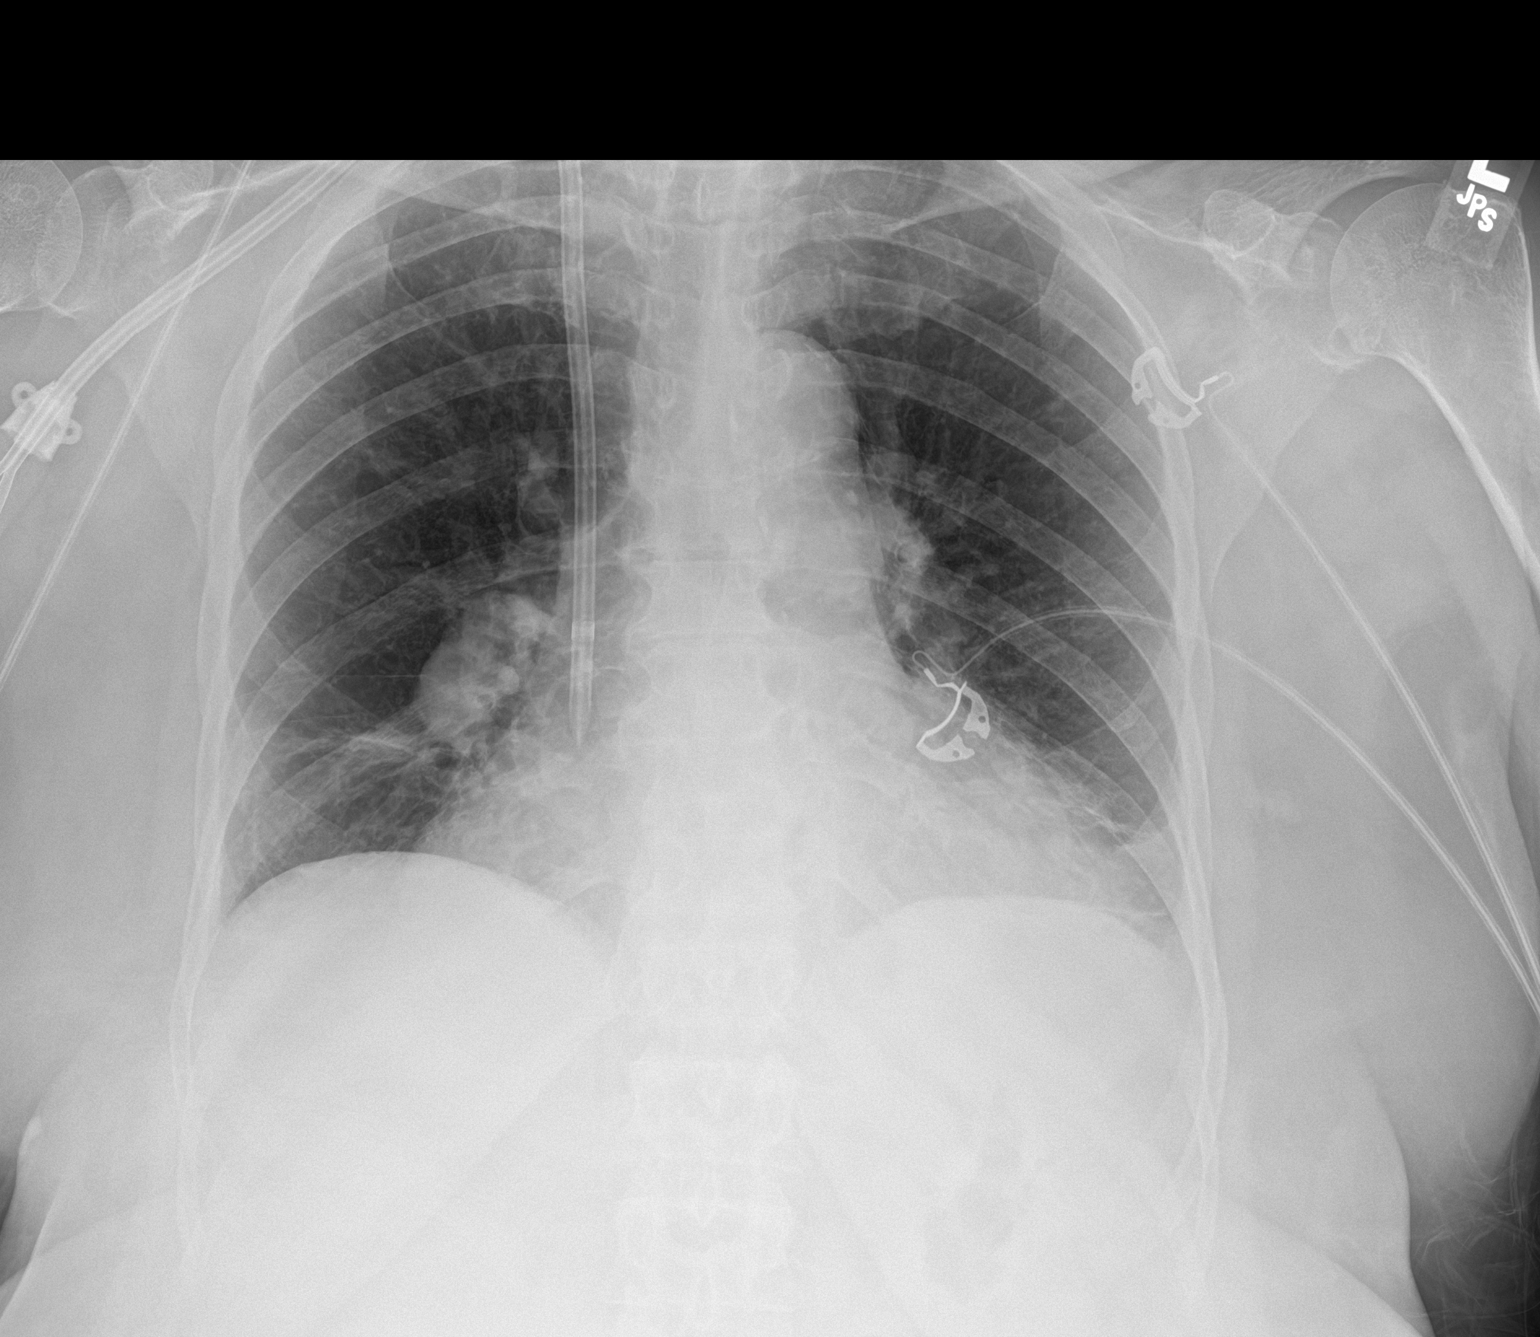

[1 of 1 positions shown; findings below may reference images not displayed]

FINDINGS: Right chest wall dialysis catheter is noted with tip at the
cavoatrial junction. No pneumothorax identified. Mild cardiac
enlargement and aortic atherosclerosis. Scarring is identified
within both lower lobes. No pleural effusion or edema identified.
IMPRESSION: 1. No complications identified status post right chest wall dialysis
catheter placement.
2. Cardiac enlargement
3. Bilateral lower lobe scarring.

## 2019-07-06 ENCOUNTER — Telehealth: Payer: Self-pay | Admitting: Internal Medicine

## 2019-07-06 MED ORDER — PROAIR HFA 108 (90 BASE) MCG/ACT IN AERS: INHALATION_SPRAY | RESPIRATORY_TRACT | 5 refills | Status: DC

## 2019-07-06 NOTE — Telephone Encounter (Signed)
Pt requesting albuterol refill.  This has been sent to pharmacy as requested.  Nothing further needed at this time- will close encounter.

## 2019-09-11 ENCOUNTER — Telehealth: Payer: Self-pay | Admitting: Internal Medicine

## 2019-09-11 MED ORDER — BUDESONIDE-FORMOTEROL FUMARATE 160-4.5 MCG/ACT IN AERO: 2.0000 | INHALATION_SPRAY | Freq: Two times a day (BID) | RESPIRATORY_TRACT | 5 refills | Status: DC

## 2019-09-11 NOTE — Telephone Encounter (Signed)
Pt requesting refill on symbicort.  This has been sent to preferred pharmacy.  Nothing further needed at this time- will close encounter.

## 2020-03-07 ENCOUNTER — Other Ambulatory Visit: Payer: Self-pay | Admitting: Internal Medicine

## 2020-07-05 ENCOUNTER — Other Ambulatory Visit: Payer: Self-pay | Admitting: Internal Medicine

## 2020-07-05 MED ORDER — BUDESONIDE-FORMOTEROL FUMARATE 160-4.5 MCG/ACT IN AERO: 2.0000 | INHALATION_SPRAY | Freq: Two times a day (BID) | RESPIRATORY_TRACT | 0 refills | Status: DC

## 2020-07-07 ENCOUNTER — Other Ambulatory Visit: Payer: Self-pay | Admitting: Internal Medicine

## 2020-08-02 ENCOUNTER — Other Ambulatory Visit: Payer: Self-pay | Admitting: Internal Medicine

## 2020-08-04 ENCOUNTER — Telehealth: Payer: Self-pay | Admitting: Internal Medicine

## 2020-08-04 MED ORDER — BUDESONIDE-FORMOTEROL FUMARATE 160-4.5 MCG/ACT IN AERO: 2.0000 | INHALATION_SPRAY | Freq: Two times a day (BID) | RESPIRATORY_TRACT | 1 refills | Status: DC

## 2020-08-04 NOTE — Telephone Encounter (Signed)
Sent RX in. Patient is overdue for visit, we will make her an apt

## 2020-08-30 ENCOUNTER — Encounter: Payer: Self-pay | Admitting: Primary Care

## 2020-08-30 ENCOUNTER — Ambulatory Visit (INDEPENDENT_AMBULATORY_CARE_PROVIDER_SITE_OTHER): Payer: Medicare Other | Admitting: Primary Care

## 2020-08-30 ENCOUNTER — Other Ambulatory Visit: Payer: Self-pay

## 2020-08-30 VITALS — BP 134/74 | HR 101 | Temp 97.5°F | Ht 64.0 in | Wt 153.0 lb

## 2020-08-30 DIAGNOSIS — J302 Other seasonal allergic rhinitis: Secondary | ICD-10-CM | POA: Diagnosis not present

## 2020-08-30 DIAGNOSIS — J449 Chronic obstructive pulmonary disease, unspecified: Secondary | ICD-10-CM | POA: Diagnosis not present

## 2020-08-30 MED ORDER — BUDESONIDE-FORMOTEROL FUMARATE 160-4.5 MCG/ACT IN AERO
2.0000 | INHALATION_SPRAY | Freq: Two times a day (BID) | RESPIRATORY_TRACT | 0 refills | Status: DC
  Filled 2021-08-23: qty 10.2, 30d supply, fill #0

## 2020-08-30 MED ORDER — FLUTICASONE PROPIONATE 50 MCG/ACT NA SUSP: 1.0000 | Freq: Every day | NASAL | 2 refills | Status: DC

## 2020-08-30 NOTE — Assessment & Plan Note (Addendum)
-   Stable interval; No recent exacerbations. Increased sob with weather change using SABA 2-3 times a week. CAT score 11.  - Continue Symbicort 160 two puff twice daily, prn albuterol q 6 hours (refill sent to pharmacy) - Adding incentive spirometer for deep breathing exercises  - FU in 6 months with Dr. Melvyn Novas

## 2020-08-30 NOTE — Progress Notes (Signed)
@Patient  ID: Mallory Chavez, female    DOB: 04-13-1936, 85 y.o.   MRN: [id number]  Chief Complaint  Patient presents with  . Follow-up    Symbicort refills. Sob worse staring 2 weeks ago. Sinus drip and sneezing    Referring provider: [name], MD  HPI: 85 year old female, former smoker. PMH significant for HTN, combined systolic and diastolic heart failure, COPD GOLD II, GERD, stage 4 kidney disease. Patient of Dr. Melvyn Novas, last seen on 06/03/19.   08/30/2020 Patient presents today for overdue follow-up, needing med refill. She is doing well, no acute respiratory complaints. She notices an improvement in her breathing with Symbicort. She has been using albuterol hfa more recently when outside. She is currently dealing with some post nasal drip/congestion. She is taking generic Claritin 10mg  daily. She is unsure if she would be able to use a nasal spray. She uses oxygen as needed with activities around the house. She can do all ADLs. Her daughter comes over nightly to pray with her. She sleeps well at night without nocturnal respiratory symptoms. She continues with dialysis MWF. Denies f/c/s, chest tightness, wheezing, cough, sinus pain or pressure.   Cough: None Dyspnea: exertional SABA: 2-3 times a week with weather change Nocturnal: None Oxygen: 2L as needed with housework   No Known Allergies  Immunization History  Administered Date(s) Administered  . Influenza Split 03/02/2013, 04/12/2014, 03/22/2016  . Influenza Whole 04/01/2012  . Influenza, High Dose Seasonal PF 03/22/2017, 03/10/2018, 03/28/2019, 05/12/2020  . Influenza,inj,Quad PF,6+ Mos 04/29/2015  . Moderna Sars-Covid-2 Vaccination 08/27/2019, 09/24/2019  . Pneumococcal Conjugate-13 03/14/2016  . Pneumococcal Polysaccharide-23 07/02/2001    Past Medical History:  Diagnosis Date  . Acute renal failure (ARF) ([place]) 06/15/2016  . Allergy   . Arthritis   . Asthma   . CHF (congestive heart failure) ([place]) 1980    controlled with meds  . Chronic respiratory failure ([place])   . CKD (chronic kidney disease)   . COPD (chronic obstructive pulmonary disease) ([place])   . GERD (gastroesophageal reflux disease)   . Hyperglycemia   . Hyperlipidemia   . Hypertension   . Hyponatremia     Tobacco History: Social History   Tobacco Use  Smoking Status Former Smoker  . Packs/day: 1.50  . Years: 20.00  . Pack years: 30.00  . Types: Cigarettes  . Quit date: 07/02/1992  . Years since quitting: 28.1  Smokeless Tobacco Never Used   Counseling given: Not Answered   Outpatient Medications Prior to Visit  Medication Sig Dispense Refill  . amLODipine (NORVASC) 10 MG tablet Take 10 mg by mouth daily after supper.     Marland Kitchen amLODipine (NORVASC) 5 MG tablet Take 5 mg by mouth at bedtime.    Marland Kitchen aspirin EC 81 MG tablet Take 81 mg by mouth daily after supper.    . cinacalcet (SENSIPAR) 30 MG tablet Take 30 mg by mouth daily.    . colchicine 0.6 MG tablet TK 1 T PO  PRN    . cyclobenzaprine (FLEXERIL) 5 MG tablet Take 1 tablet (5 mg total) by mouth at bedtime. 90 tablet 3  . famotidine (PEPCID) 40 MG tablet Take 40 mg by mouth at bedtime.    . gabapentin (NEURONTIN) 100 MG capsule Take 1 capsule (100 mg total) by mouth at bedtime. 90 capsule 3  . lidocaine-prilocaine (EMLA) cream USE ON ARM PRE-DIALYSIS    . loratadine (CLARITIN) 10 MG tablet Take 10 mg by mouth daily after supper.    Marland Kitchen  multivitamin (RENA-VIT) TABS tablet TAKE 1 TABLET BY MOUTH EVERY DAY (ON DIALYSIS DAYS, TAKE AFTER DIALYSIS TREATMENT)    . OXYGEN Inhale 2 L into the lungs See admin instructions. 2lpm with sleep and exertion  [facility]    . pantoprazole (PROTONIX) 40 MG tablet TK 1 T PO QAM    . PROAIR HFA 108 (90 Base) MCG/ACT inhaler INHALE 2 PUFFS EVERY 4 HOURS IF NEEDED WHEEZING SHORTNESS OF BREATH 8.5 g 5  . sevelamer carbonate (RENVELA) 800 MG tablet Take 3 tablets (2,400 mg total) by mouth 3 (three) times daily with meals. 270 tablet 0  . Zinc 50 MG TABS  Take 1 tablet by mouth daily.    . budesonide-formoterol (SYMBICORT) 160-4.5 MCG/ACT inhaler Inhale 2 puffs into the lungs 2 (two) times daily. 10.2 g 1   No facility-administered medications prior to visit.   Review of Systems  Review of Systems  HENT: Positive for postnasal drip.   Respiratory: Negative for cough, chest tightness, shortness of breath and wheezing.    Physical Exam  BP 134/74 (BP Location: Right Arm)   Pulse (!) 101   Temp (!) 97.5 F (36.4 C)   Ht 5\' 4"  (1.626 m)   Wt 153 lb (69.4 kg)   SpO2 90%   BMI 26.26 kg/m  Physical Exam Constitutional:      Appearance: Normal appearance.  HENT:     Head: Normocephalic and atraumatic.     Mouth/Throat:     Mouth: Mucous membranes are moist.     Pharynx: Oropharynx is clear.  Cardiovascular:     Rate and Rhythm: Normal rate and regular rhythm.  Pulmonary:     Effort: Pulmonary effort is normal.     Breath sounds: Normal breath sounds.  Musculoskeletal:        General: Normal range of motion.  Neurological:     General: No focal deficit present.     Mental Status: She is alert and oriented to person, place, and time. Mental status is at baseline.  Psychiatric:        Mood and Affect: Mood normal.        Behavior: Behavior normal.        Thought Content: Thought content normal.        Judgment: Judgment normal.      Lab Results:  CBC    Component Value Date/Time   WBC 14.4 (H) 07/28/2018 0700   RBC 2.95 (L) 07/28/2018 0700   HGB 8.7 (L) 07/28/2018 0700   HCT 26.6 (L) 07/28/2018 0700   PLT 224 07/28/2018 0700   MCV 90.2 07/28/2018 0700   MCH 29.5 07/28/2018 0700   MCHC 32.7 07/28/2018 0700   RDW 15.3 07/28/2018 0700   LYMPHSABS 1.6 07/22/2018 0445   MONOABS 1.2 (H) 07/22/2018 0445   EOSABS 0.3 07/22/2018 0445   BASOSABS 0.0 07/22/2018 0445    BMET    Component Value Date/Time   NA 130 (L) 07/28/2018 0422   K 4.1 07/28/2018 0422   CL 93 (L) 07/28/2018 0422   CO2 22 07/28/2018 0422    GLUCOSE 142 (H) 07/28/2018 0422   BUN 63 (H) 07/28/2018 0422   CREATININE 5.15 (H) 07/28/2018 0422   CALCIUM 11.1 (H) 07/28/2018 0422   GFRNONAA 7 (L) 07/28/2018 0422   GFRAA 8 (L) 07/28/2018 0422    BNP    Component Value Date/Time   BNP 784.4 (H) 07/19/2018 1744    ProBNP    Component Value Date/Time   PROBNP  76.0 12/19/2012 1100    Imaging: No results found.   Assessment & Plan:   COPD GOLD II - Stable interval; No recent exacerbations. Increased sob with weather change using SABA 2-3 times a week. CAT score 11.  - Continue Symbicort 160 two puff twice daily, prn albuterol q 6 hours (refill sent to pharmacy) - Adding incentive spirometer for deep breathing exercises  - FU in 6 months with Dr. Melvyn Novas   Seasonal allergic rhinitis - No signs of bacterial sinusitis  - Continue Loratadine 10mg  daily  - Adding flonase nasal spray 1 puff per nostril daily    Martyn Ehrich, NP 08/30/2020

## 2020-08-30 NOTE — Patient Instructions (Addendum)
Pleasure seeing you today Mallory Chavez  Refilling Symbicort- continue two puffs morning and evening  Sending in prescription for flonase nasal spray to use daily for post nasal drip/congestion Use Incentive spirometer 2-3 times a day for deep breathing exercises  Follow-up: 6 months with Dr. Melvyn Novas

## 2020-08-30 NOTE — Assessment & Plan Note (Signed)
-   No signs of bacterial sinusitis  - Continue Loratadine 10mg  daily  - Adding flonase nasal spray 1 puff per nostril daily

## 2020-10-03 ENCOUNTER — Other Ambulatory Visit: Payer: Self-pay | Admitting: Internal Medicine

## 2020-10-29 ENCOUNTER — Other Ambulatory Visit: Payer: Self-pay | Admitting: Primary Care

## 2021-01-25 ENCOUNTER — Other Ambulatory Visit: Payer: Self-pay | Admitting: Otolaryngology

## 2021-01-25 DIAGNOSIS — H90A31 Mixed conductive and sensorineural hearing loss, unilateral, right ear with restricted hearing on the contralateral side: Secondary | ICD-10-CM

## 2021-02-11 ENCOUNTER — Ambulatory Visit
Admission: RE | Admit: 2021-02-11 | Discharge: 2021-02-11 | Disposition: A | Discharge status: Home / Self Care | Payer: Medicare Other | Source: Ambulatory Visit | Attending: Otolaryngology | Admitting: Otolaryngology

## 2021-02-11 ENCOUNTER — Other Ambulatory Visit: Payer: Self-pay

## 2021-02-11 DIAGNOSIS — H90A31 Mixed conductive and sensorineural hearing loss, unilateral, right ear with restricted hearing on the contralateral side: Secondary | ICD-10-CM

## 2021-02-11 MED ORDER — GADOBENATE DIMEGLUMINE 529 MG/ML IV SOLN
14.0000 mL | Freq: Once | INTRAVENOUS | Status: AC | PRN
  Administered 2021-02-11: 14 mL via INTRAVENOUS

## 2021-03-02 ENCOUNTER — Ambulatory Visit: Payer: Medicaid Other | Admitting: Internal Medicine

## 2021-03-09 ENCOUNTER — Ambulatory Visit: Payer: Medicaid Other | Admitting: Internal Medicine

## 2021-03-30 ENCOUNTER — Other Ambulatory Visit: Payer: Self-pay

## 2021-03-30 ENCOUNTER — Encounter: Payer: Self-pay | Admitting: Internal Medicine

## 2021-03-30 ENCOUNTER — Ambulatory Visit: Payer: Medicare Other | Admitting: Internal Medicine

## 2021-03-30 DIAGNOSIS — J9611 Chronic respiratory failure with hypoxia: Secondary | ICD-10-CM

## 2021-03-30 DIAGNOSIS — J449 Chronic obstructive pulmonary disease, unspecified: Secondary | ICD-10-CM | POA: Diagnosis not present

## 2021-03-30 MED ORDER — PANTOPRAZOLE SODIUM 40 MG PO TBEC
40.0000 mg | DELAYED_RELEASE_TABLET | Freq: Every day | ORAL | 2 refills | Status: DC
Start: 2021-03-30 — End: 2021-09-26

## 2021-03-30 NOTE — Patient Instructions (Addendum)
Pantoprazole (protonix) 40 mg   Take  30-60 min before first meal of the day and Pepcid (famotidine)  40 mg after supper until return to office - this is the best way to tell whether stomach acid is contributing to your problem.    Work on inhaler technique:  relax and gently blow all the way out then take a nice smooth full deep breath back in, triggering the inhaler at same time you start breathing in.  Hold for up to 5 seconds if you can. Blow symbicort out thru nose. Rinse and gargle with water when done.  If mouth or throat bother you at all,  try brushing teeth/gums/tongue with arm and hammer toothpaste/ make a slurry and gargle and spit out.        Please schedule a follow up visit in 6 months but call sooner if needed

## 2021-03-30 NOTE — Progress Notes (Signed)
Subjective:   Patient ID: Mallory Chavez, female    DOB: [birth year] MRN: [id number]    Brief patient profile:  89 yowbf quit smoking around 1994 bothered by some seasonal rhinitis spring > fall x around 1990 then 2012 developed breathing problems dx asthma variably responsive to rx referred 10/01/2012 to pulmonary clinic by Dr Jeanie Cooks and proved to have GOLD II COPD 10/2012    History of Present Illness  10/01/2012 1st pulmonary /Hurshell Dino  On ACEi cc abruptly 2012 shortness of breath assoc with cough min productive mostly clear. Sob x across a parking lot. Not responding to multiple asthma meds. rec benicar 40/25 one half daily in place of lisnopril GERD diet    04/19/2014 acute  ov/Lydell Moga re: GOLD II  copd/ no med calendar/ not really clear on how to use meds  Chief Complaint  Patient presents with   Acute Visit    Pt c/o increased SOB and wheezing x 2 wks.  She is SOB mainly with exertion such as walking from room to room at home. She is using her proair 2-3 times per day.   baseline doe/fatigue [employer] parking/ [name] or [employer]  Now sob across room  Nose runs some, clariton helps  Only using symbicort once a day  Onset of worsening was indolent/progressive, some better p saba  >>Increase Symbicort  To 160 , and O2 at 4l/m with act , ONO ordered      02/06/2018  f/u ov/Yisrael Obryan re:  GOLD II/ 02 2lpm hs and prn daytime  Chief Complaint  Patient presents with   Follow-up    Increased SOB x 1 month. She has been using her albuterol inhaler 2 x daily on average.   Dyspnea:  Uses [employer] parking / struggles to do [employer] on 2lpm. Gradually worse since weather turned hot  Cough: none  SABA use: as above  02: 2lpm does not titrate  rec No change in pulmonary medications Adjust your 02 to keep your saturations above 90% at all times Please schedule a follow up visit in 3 months but call sooner if needed  - add:  Echo needed to r/o cor pulmonale  - Echo 02/14/2018 - Left ventricle: The  cavity size was normal. Wall thickness was   increased in a pattern of mild LVH. Systolic function was mildly   reduced. The estimated ejection fraction was in the range of 45%   to 50%. Diffuse hypokinesis. Doppler parameters are consistent   with abnormal left ventricular relaxation (grade 1 diastolic   dysfunction). - Aortic valve: Trileaflet; mildly thickened, mildly calcified   leaflets. - Left atrium: The atrium was mildly dilated. - Pulmonary arteries: Systolic pressure was moderately increased.   PA peak pressure: 48 mm Hg (S).     05/19/2018  f/u ov/Sylva Overley re:  GOLD II copd criteria/ 02 dep 2lpm 24/7  Chief Complaint  Patient presents with   Follow-up    PFT's done today.  Breathing is unchanged since her last visit. She is using her proair inhaler 1-2 x per day on average.   Dyspnea:  Much better on 02 and able to do housekeeping/walk at [facility] or [employer], but struggles to get in to buildings carrying 02 across parking lot despite adequate sats on POC Cough: none Sleeping: R side/ bricks under headboard  SABA use: as above 02: as above   rec Plan A = Automatic =  symb 160 2bid  Work on [employer] and spatial [employer]  Plan B =  Backup Only use your albuterol inhaler  If needed  Please schedule a follow up visit in 3 months but call sooner if needed  with all medications /inhalers/ solutions in hand so we can verify exactly what you are taking. This includes all medications from all doctors and over the counters           02/23/2019  f/u ov/Analysa Nutting re:  GOLD II spiromnetry / 02 desp hs / maint on symb 160 2bid  Chief Complaint  Patient presents with   Follow-up    Breathing is overall doing well. She is using her rescue inhaler once daily on average.   Dyspnea:  Still doing [employer] s 02  Cough: no Sleeping: ok 2 blocks/ some insomnia  SABA use: once daily when exerting never at rest or noct  02: 1lpm hs and using 1lpm with housecleaning only  Rec Try albuterol 15 min  before an activity that you know would make you short of breath and see if it makes any difference and if makes none then don't take it after activity unless you can't catch your breath. Goal for 02 is to keep to 02 sats over 90%    03/30/2021  f/u ov/Eulala Newcombe re: copd 2/ resp failure  maint on symb 1602bid    HD tiw  Chief Complaint  Patient presents with   Follow-up    Chest congestion in the evenings recently, not coughing much. She states mucus gets stuck in her throat.    Dyspnea:  walks with cane, some outdoor walking  Cough: worse in afternoons most days x one week assoc hoarness Sleeping: fine/ bricks under headboard  SABA use: rarely 02: prn once or twice a week  Covid status:   vax 2    No obvious day to day or daytime variability or assoc excess/ purulent sputum or mucus plugs or hemoptysis or cp or chest tightness, subjective wheeze or overt sinus or hb symptoms.   Sleeping as above  without nocturnal  or early am exacerbation  of respiratory  c/o's or need for noct saba. Also denies any obvious fluctuation of symptoms with weather or environmental changes or other aggravating or alleviating factors except as outlined above   No unusual exposure hx or h/o childhood pna/ asthma or knowledge of premature birth.  Current Allergies, Complete Past Medical History, Past Surgical History, Family History, and Social History were reviewed in [employer] record.  ROS  The following are not active complaints unless bolded Hoarseness, sore throat, dysphagia, dental problems, itching, sneezing,  nasal congestion or discharge of excess mucus or purulent secretions, ear ache,   fever, chills, sweats, unintended wt loss or wt gain, classically pleuritic or exertional cp,  orthopnea pnd or arm/hand swelling  or leg swelling, presyncope, palpitations, abdominal pain, anorexia, nausea, vomiting, diarrhea  or change in bowel habits or change in bladder habits, change in stools or  change in urine, dysuria, hematuria,  rash, arthralgias, visual complaints, headache, numbness, weakness or ataxia or problems with walking or coordination,  change in mood or  memory.        Current Meds  Medication Sig   albuterol (VENTOLIN HFA) 108 (90 Base) MCG/ACT inhaler INHALE 2 PUFFS BY MOUTH EVERY 4 HOURS AS NEEDED FOR WHEEZING OR SHORTNESS OF BREATH   aspirin EC 81 MG tablet Take 81 mg by mouth daily after supper.   budesonide-formoterol (SYMBICORT) 160-4.5 MCG/ACT inhaler Inhale 2 puffs into the lungs 2 (two) times daily.  colchicine 0.6 MG tablet TK 1 T PO  PRN   cyclobenzaprine (FLEXERIL) 5 MG tablet Take 1 tablet (5 mg total) by mouth at bedtime.   famotidine (PEPCID) 40 MG tablet Take 40 mg by mouth at bedtime.   gabapentin (NEURONTIN) 100 MG capsule Take 1 capsule (100 mg total) by mouth at bedtime.   loratadine (CLARITIN) 10 MG tablet Take 10 mg by mouth daily after supper.   multivitamin (RENA-VIT) TABS tablet TAKE 1 TABLET BY MOUTH EVERY DAY (ON DIALYSIS DAYS, TAKE AFTER DIALYSIS TREATMENT)   OXYGEN Inhale 2 L into the lungs See admin instructions. 2lpm with sleep and exertion  AHC   sevelamer carbonate (RENVELA) 800 MG tablet Take 3 tablets (2,400 mg total) by mouth 3 (three) times daily with meals.                    Objective:   Physical Exam  03/30/2021      150  02/23/2019     166  11/10/2012       206 vs 12/19/2012 206  Vs 207 01/16/2013 > 04/27/2013  210 > 06/15/2013  208  >211 07/27/2013 > 04/19/14 201 >203 05/10/2014 > 07/05/2014 201 >10/07/2014 200>  01/10/2015   195 > 09/13/2015  193 > 03/15/2016  192  > 12/20/2016 185 > 08/11/2017  189 > 02/06/2018  186 >  05/19/2018  177 > 08/25/2018   170   Vital signs reviewed  03/30/2021  - Note at rest 02 sats  92% on RA   General appearance:    elderly bf nad    HEENT : pt wearing mask not removed for exam due to covid -19 concerns.    NECK :  without JVD/Nodes/TM/ nl carotid upstrokes bilaterally   LUNGS: no acc muscle  use,  Mod barrel  contour chest wall with bilateral  Distant bs s audible wheeze and  without cough on insp or exp maneuvers and mod  Hyperresonant  to  percussion bilaterally     CV:  RRR  no s3 or murmur or increase in P2, and no edema   ABD:  soft and nontender with pos mid insp Hoover's  in the supine position. No bruits or organomegaly appreciated, bowel sounds nl  MS:     ext warm without deformities, calf tenderness, cyanosis or clubbing No obvious joint restrictions   SKIN: warm and dry without lesions    NEURO:  alert, approp, nl sensorium with  no motor or cerebellar deficits apparent.                Assessment & Plan:

## 2021-03-31 ENCOUNTER — Encounter: Payer: Self-pay | Admitting: Internal Medicine

## 2021-03-31 NOTE — Assessment & Plan Note (Addendum)
Onset 2015 04/19/14 walked one lap dropped to 80% required 4lpm with walking to maintain sats  ono RA 04/21/14 desat x 7 h x 12 m at < 89%  04/26/2014 rec 2lpm and repeat ono on 2lpm  - 01/10/2015  Walked 2lpmx 2 laps @ 185 ft each stopped due to hip pain, slow pace/ no sob or desat  -Patient Saturations on Room Air at Rest = 95% Patient Saturations on [employer] while Ambulating = 87% Patient Saturations on 2 Liters of oxygen while Ambulating = 97% - 05/19/2018   Walked 2lpm POC x one lap @ 210 ft  stopped due to sob s desats   - 02/23/2019   Walked RA x one lap =  approx 250 ft - stopped due to sob with sats 84% at slow pace though RA sats at rest ok  - ONO on 1lpm  03/08/19  desat x 24 sec only > continue 1 lpm  - 03/30/2021   Walked on RA x  3/4  lap(s) =  approx 200 @ slow pace, stopped due to sob with lowest 02 sats 88%    Advised: Make sure you check your oxygen saturation  at your highest level of activity  to be sure it stays over 90% and adjust  02 flow upward to maintain this level if needed but remember to turn it back to previous settings when you stop (to conserve your supply).   F/u q 6 m   Each maintenance medication was reviewed in detail including emphasizing most importantly the difference between maintenance and prns and under what circumstances the prns are to be triggered using an action plan format where appropriate.  Total time for H and P, chart review, counseling,  directly observing portions of ambulatory 02 saturation study/ and generating customized AVS unique to this office visit / same day charting = 25 min

## 2021-03-31 NOTE — Assessment & Plan Note (Addendum)
Quit smoking  1994 - PFT's 11/10/2012 FEV1 0.85 (52%)ratio 56 and no better p B 2 with DLCO 34 corrects to 60 - doe x 5-10 min stationery bike > add tudorza 11/10/12 > d/c 05/2013 and no change - PFT's  05/19/2018  FEV1 0.85 (56 % ) ratio 58  p 8 % improvement from saba p symb  prior to study with DLCO  27 % corrects to 47   % for alv volume  And ERV = 0.22  - 03/30/2021  After extensive coaching inhaler device,  effectiveness =    75% hfa short ti   Adequate control on present rx, reviewed in detail with pt > no change in rx needed    Advised sense of globus likely related to gerd, not retained secretions as occurs during the day, not in am's or overnight, so max gerd rx before further w/u .

## 2021-07-06 ENCOUNTER — Other Ambulatory Visit: Payer: Self-pay

## 2021-07-06 ENCOUNTER — Ambulatory Visit: Payer: Medicare Other | Admitting: Podiatry

## 2021-07-06 DIAGNOSIS — M2012 Hallux valgus (acquired), left foot: Secondary | ICD-10-CM | POA: Diagnosis not present

## 2021-07-06 DIAGNOSIS — M21612 Bunion of left foot: Secondary | ICD-10-CM

## 2021-07-06 DIAGNOSIS — M10372 Gout due to renal impairment, left ankle and foot: Secondary | ICD-10-CM | POA: Diagnosis not present

## 2021-07-06 MED ORDER — METHYLPREDNISOLONE 4 MG PO TBPK: ORAL_TABLET | ORAL | 0 refills | Status: DC

## 2021-07-07 LAB — CBC WITH DIFFERENTIAL/PLATELET
Basophils Absolute: 0 10*3/uL (ref 0.0–0.2)
Basos: 1 %
EOS (ABSOLUTE): 0.1 10*3/uL (ref 0.0–0.4)
Eos: 2 %
Hematocrit: 33 % — ABNORMAL LOW (ref 34.0–46.6)
Hemoglobin: 11.6 g/dL (ref 11.1–15.9)
Immature Grans (Abs): 0 10*3/uL (ref 0.0–0.1)
Immature Granulocytes: 0 %
Lymphocytes Absolute: 1.5 10*3/uL (ref 0.7–3.1)
Lymphs: 25 %
MCH: 33.6 pg — ABNORMAL HIGH (ref 26.6–33.0)
MCHC: 35.2 g/dL (ref 31.5–35.7)
MCV: 96 fL (ref 79–97)
Monocytes Absolute: 0.6 10*3/uL (ref 0.1–0.9)
Monocytes: 11 %
Neutrophils Absolute: 3.7 10*3/uL (ref 1.4–7.0)
Neutrophils: 61 %
Platelets: 196 10*3/uL (ref 150–450)
RBC: 3.45 x10E6/uL — ABNORMAL LOW (ref 3.77–5.28)
RDW: 13.1 % (ref 11.7–15.4)
WBC: 6 10*3/uL (ref 3.4–10.8)

## 2021-07-07 LAB — URIC ACID: Uric Acid: 2.6 mg/dL — ABNORMAL LOW (ref 3.1–7.9)

## 2021-07-07 LAB — SEDIMENTATION RATE: Sed Rate: 15 mm/hr (ref 0–40)

## 2021-07-10 ENCOUNTER — Encounter: Payer: Self-pay | Admitting: Podiatry

## 2021-07-10 NOTE — Progress Notes (Signed)
°  Subjective:  Patient ID: Mallory Chavez, female    DOB: [birth year],  MRN: [id number]  Chief Complaint  Patient presents with   Foot Pain    knot on side of L foot near great toe, causing pain    86 y.o. female presents with the above complaint. History confirmed with patient.  She has had problems with gout before and this feels like it is flaring  Objective:  Physical Exam: warm, good capillary refill, no trophic changes or ulcerative lesions, normal DP and PT pulses, and normal sensory exam. Left Foot: soft tissue swelling noted over the first MTPJ with pain on palpation and erythema and bunion deformity noted Assessment:   1. 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  Discussed etiology and treatment options of gout.  I gave her a prescription for methylprednisolone taper.  She also has colchicine and has been taking this.  Discussed the option of an intra-articular injection but she prefers to stick with an oral medication for now to see if this alleviates it.  I advised her if it does not improve she should return and see me for an injection.  I also gave her lab work to check her uric acid level  Return if symptoms worsen or fail to improve.

## 2021-07-24 ENCOUNTER — Other Ambulatory Visit (HOSPITAL_COMMUNITY): Payer: Self-pay

## 2021-07-25 ENCOUNTER — Other Ambulatory Visit (HOSPITAL_COMMUNITY): Payer: Self-pay

## 2021-07-26 ENCOUNTER — Other Ambulatory Visit (HOSPITAL_COMMUNITY): Payer: Self-pay

## 2021-07-26 MED ORDER — ALBUTEROL SULFATE HFA 108 (90 BASE) MCG/ACT IN AERS: INHALATION_SPRAY | RESPIRATORY_TRACT | 3 refills | Status: DC

## 2021-07-26 MED ORDER — COLCHICINE 0.6 MG PO TABS: ORAL_TABLET | ORAL | 3 refills | Status: DC

## 2021-07-26 MED ORDER — FAMOTIDINE 40 MG PO TABS
40.0000 mg | ORAL_TABLET | ORAL | 3 refills | Status: DC
  Filled 2021-08-23: qty 90, 90d supply, fill #0
  Filled 2021-11-22: qty 90, 90d supply, fill #1

## 2021-07-26 MED ORDER — FLUTICASONE PROPIONATE 50 MCG/ACT NA SUSP: NASAL | 2 refills | Status: DC

## 2021-07-26 MED ORDER — CYCLOBENZAPRINE HCL 5 MG PO TABS
5.0000 mg | ORAL_TABLET | ORAL | 3 refills | Status: DC
  Filled 2021-07-26 – 2021-09-12 (×2): qty 90, 90d supply, fill #0
  Filled 2021-11-22: qty 90, 90d supply, fill #1

## 2021-07-26 MED ORDER — CERTAVITE/ANTIOXIDANTS PO TABS: ORAL_TABLET | ORAL | 3 refills | Status: DC

## 2021-08-23 ENCOUNTER — Other Ambulatory Visit (HOSPITAL_COMMUNITY): Payer: Self-pay

## 2021-08-24 ENCOUNTER — Other Ambulatory Visit (HOSPITAL_COMMUNITY): Payer: Self-pay

## 2021-08-25 ENCOUNTER — Other Ambulatory Visit (HOSPITAL_COMMUNITY): Payer: Self-pay

## 2021-08-28 ENCOUNTER — Other Ambulatory Visit (HOSPITAL_COMMUNITY): Payer: Self-pay

## 2021-08-28 MED ORDER — LIDOCAINE-PRILOCAINE 2.5-2.5 % EX CREA
TOPICAL_CREAM | CUTANEOUS | 11 refills | Status: DC
  Filled 2021-08-28: qty 30, 30d supply, fill #0

## 2021-09-06 ENCOUNTER — Other Ambulatory Visit (HOSPITAL_COMMUNITY): Payer: Self-pay

## 2021-09-12 ENCOUNTER — Other Ambulatory Visit (HOSPITAL_COMMUNITY): Payer: Self-pay

## 2021-09-22 ENCOUNTER — Other Ambulatory Visit: Payer: Self-pay | Admitting: Internal Medicine

## 2021-09-22 ENCOUNTER — Other Ambulatory Visit (HOSPITAL_COMMUNITY): Payer: Self-pay

## 2021-09-22 MED FILL — Budesonide-Formoterol Fumarate Dihyd Aerosol 160-4.5 MCG/ACT: RESPIRATORY_TRACT | 30 days supply | Qty: 10.2 | Fill #0 | Status: AC

## 2021-09-24 ENCOUNTER — Other Ambulatory Visit: Payer: Self-pay | Admitting: Primary Care

## 2021-09-25 ENCOUNTER — Other Ambulatory Visit (HOSPITAL_COMMUNITY): Payer: Self-pay

## 2021-09-26 ENCOUNTER — Other Ambulatory Visit: Payer: Self-pay

## 2021-09-26 ENCOUNTER — Ambulatory Visit: Payer: Medicare Other | Admitting: Internal Medicine

## 2021-09-26 ENCOUNTER — Encounter: Payer: Self-pay | Admitting: Internal Medicine

## 2021-09-26 DIAGNOSIS — J449 Chronic obstructive pulmonary disease, unspecified: Secondary | ICD-10-CM | POA: Diagnosis not present

## 2021-09-26 NOTE — Progress Notes (Signed)
?Subjective:  ? ?Patient ID: Mallory Chavez, female    DOB: 1936/01/14 MRN: [id number] ? ? ? ?Brief patient profile:  ?43   yobf quit smoking around 1994 bothered by some seasonal rhinitis spring > fall x around 1990 then 2012 developed breathing problems dx asthma variably responsive to rx referred 10/01/2012 to pulmonary clinic by Dr Jeanie Cooks and proved to have GOLD II COPD 10/2012 ? ? ? ?History of Present Illness  ?10/01/2012 1st pulmonary /Mallory Chavez  On ACEi cc abruptly 2012 shortness of breath assoc with cough min productive mostly clear. Sob x across a parking lot. Not responding to multiple asthma meds. ?rec ?benicar 40/25 one half daily in place of lisnopril ?GERD diet  ? ? ?02/06/2018  f/u ov/Mallory Chavez re:  GOLD II/ 02 2lpm hs and prn daytime  ?Chief Complaint  ?Patient presents with  ? Follow-up  ?  Increased SOB x 1 month. She has been using her albuterol inhaler 2 x daily on average.   ?Dyspnea:  Uses [employer] parking / struggles to do [employer] on 2lpm. Gradually worse since weather turned hot  ?Cough: none  ?SABA use: as above  ?02: 2lpm does not titrate  ?rec ?No change in pulmonary medications ?Adjust your 02 to keep your saturations above 90% at all times ?Please schedule a follow up visit in 3 months but call sooner if needed  ?- add:  Echo needed to r/o cor pulmonale  ?- Echo 02/14/2018 - Left ventricle: The cavity size was normal. Wall thickness was ?  increased in a pattern of mild LVH. Systolic function was mildly ?  reduced. The estimated ejection fraction was in the range of 45% ?  to 50%. Diffuse hypokinesis. Doppler parameters are consistent ?  with abnormal left ventricular relaxation (grade 1 diastolic ?  dysfunction). ?- Aortic valve: Trileaflet; mildly thickened, mildly calcified ?  leaflets. ?- Left atrium: The atrium was mildly dilated. ?- Pulmonary arteries: Systolic pressure was moderately increased. ?  PA peak pressure: 48 mm Hg (S). ? ? ? ?03/30/2021  f/u ov/Mallory Chavez re: copd 2/ resp failure  maint on  symb 1602bid    HD tiw  ?Chief Complaint  ?Patient presents with  ? Follow-up  ?  Chest congestion in the evenings recently, not coughing much. She states mucus gets stuck in her throat.   ? Dyspnea:  walks with cane, some outdoor walking  ?Cough: worse in afternoons most days x one week assoc hoarness ?Sleeping: fine/ bricks under headboard  ?SABA use: rarely ?02: prn once or twice a week  ?Covid status:   vax 2  ?Rec ?Pantoprazole (protonix) 40 mg   Take  30-60 min before first meal of the day and Pepcid (famotidine)  40 mg after supper until return to office  ?Work on inhaler technique:   ?   ? ? 09/26/2021  f/u ov/Mallory Chavez re: GOLD 2/  maint on symb 160 2bid and no longer using 02 at all but wants it on hand / HD MWF ?Chief Complaint  ?Patient presents with  ? Follow-up  ?  Pt states she has been doing okay since last visit and denies any complaints.  ? Dyspnea:  walks with cane up to 2 laps around parking s stopping ? How many min =  "a few"  ?Cough: none  ?Sleeping: bed blocks ?SABA use: very little  ?02: has but not using  ?Covid status:   vax x 4  ? ? ?No obvious day to day or daytime variability  or assoc excess/ purulent sputum or mucus plugs or hemoptysis or cp or chest tightness, subjective wheeze or overt sinus or hb symptoms.  ? ?Sleeping as above  without nocturnal  or early am exacerbation  of respiratory  c/o's or need for noct saba. Also denies any obvious fluctuation of symptoms with weather or environmental changes or other aggravating or alleviating factors except as outlined above  ? ?No unusual exposure hx or h/o childhood pna/ asthma or knowledge of premature birth. ? ?Current Allergies, Complete Past Medical History, Past Surgical History, Family History, and Social History were reviewed in [employer] record. ? ?ROS  The following are not active complaints unless bolded ?Hoarseness, sore throat, dysphagia, dental problems, itching, sneezing,  nasal congestion or discharge  of excess mucus or purulent secretions, ear ache,   fever, chills, sweats, unintended wt loss or wt gain, classically pleuritic or exertional cp,  orthopnea pnd or arm/hand swelling  or leg swelling, presyncope, palpitations, abdominal pain, anorexia, nausea, vomiting, diarrhea  or change in bowel habits or change in bladder habits, change in stools or change in urine, dysuria, hematuria,  rash, arthralgias, visual complaints, headache, numbness, weakness or ataxia or problems with walking or coordination,  change in mood or  memory. ?      ? ?Current Meds  ?Medication Sig  ? albuterol (VENTOLIN HFA) 108 (90 Base) MCG/ACT inhaler Inhale 2 puffs by mouth into the lungs every 4 hours as needed for wheezing or shortness of breath  ? aspirin EC 81 MG tablet Take 81 mg by mouth daily after supper.  ? B Complex-C-Zn-Folic Acid (DIALYVITE 841 WITH ZINC) 0.8 MG TABS Take by mouth.  ? budesonide-formoterol (SYMBICORT) 160-4.5 MCG/ACT inhaler Inhale 2 puffs into the lungs 2 (two) times daily.  ? colchicine 0.6 MG tablet Take 1 tablet by mouth once daily as needed  ? cyclobenzaprine (FLEXERIL) 5 MG tablet Take 1 tablet by mouth at bedtime  ? famotidine (PEPCID) 40 MG tablet Take 1 tablet by mouth once daily  ? fluticasone (FLONASE) 50 MCG/ACT nasal spray Spray into each nostril once daily *shake before uses*  ? gabapentin (NEURONTIN) 100 MG capsule Take 1 capsule (100 mg total) by mouth at bedtime.  ? lidocaine-prilocaine (EMLA) cream Apply a small amount to skin as directed Apply to AVF- prior to dialysis  ? loratadine (CLARITIN) 10 MG tablet Take 10 mg by mouth daily after supper.  ? Multiple Vitamins-Minerals (CERTAVITE/ANTIOXIDANTS) TABS Take 1 tablet by mouth at bedtime  ? OXYGEN Inhale 2 L into the lungs See admin instructions. 2lpm with sleep and exertion  ?AHC  ? [DISCONTINUED] cyclobenzaprine (FLEXERIL) 5 MG tablet Take 1 tablet (5 mg total) by mouth at bedtime.  ?    ?  ? ?  ?  ?  ? ?   ?Objective:  ? Physical  Exam ? ?Wts ? ?09/26/2021      142  ?03/30/2021      150  ?02/23/2019     166  ?11/10/2012       206 vs 12/19/2012 206  Vs 207 01/16/2013 > 04/27/2013  210 > 06/15/2013  208  >211 07/27/2013 > 04/19/14 201 >203 05/10/2014 > 07/05/2014 201 >10/07/2014 200>  01/10/2015   195 > 09/13/2015  193 > 03/15/2016  192  > 12/20/2016 185 > 08/11/2017  189 > 02/06/2018  186 >  05/19/2018  177 > 08/25/2018   170  ? ?Vital signs reviewed  09/26/2021  - Note at rest 02  sats  93% on RA  ? ?General appearance:    amb bf looks much healthier than prev ov's  ? ? ?HEENT : pt wearing mask not removed for exam due to covid - 19 concerns.  ? ? ?NECK :  without JVD/Nodes/TM/ nl carotid upstrokes bilaterally ? ? ?LUNGS: no acc muscle use,  Mild barrel  contour chest wall with bilateral  Distant bs s audible wheeze and  without cough on insp or exp maneuvers  and mild  Hyperresonant  to  percussion bilaterally   ? ? ?CV:  RRR  no s3 or murmur or increase in P2, and no edema  ? ?ABD:  soft and nontender with pos end  insp Hoover's  in the supine position. No bruits or organomegaly appreciated, bowel sounds nl ? ?MS:   Nl gait/  ext warm without deformities, calf tenderness, cyanosis or clubbing ?No obvious joint restrictions  ? ?SKIN: warm and dry without lesions   ? ?NEURO:  alert, approp, nl sensorium with  no motor or cerebellar deficits apparent.  ?    ? ? ? ? ?   ?   ?Assessment & Plan:  ?  ? ? ?  ?  ?  ?

## 2021-09-26 NOTE — Patient Instructions (Signed)
No change in medications   Please schedule a follow up visit in 12  months but call sooner if needed  

## 2021-09-26 NOTE — Assessment & Plan Note (Signed)
Quit smoking  1994 ?- PFT's 11/10/2012 FEV1 0.85 (52%)ratio 56 and no better p B 2 with DLCO 34 corrects to 60 ?- doe x 5-10 min stationery bike > add tudorza 11/10/12 > d/c 05/2013 and no change ?- PFT's  05/19/2018  FEV1 0.85 (56 % ) ratio 58  p 8 % improvement from saba p symb  prior to study with DLCO  27 % corrects to 47   % for alv volume  And ERV = 0.22  ?- 09/26/2021  After extensive coaching inhaler device,  effectiveness =    80% > continue symbicort 160 2bid  ? ?Mostly AB much better on symb 160 and with resolution of cardiac asthma from vol overload resolved. ? ?F/u can be yearly - call sooner prn  ? ?    ?  ? ?Each maintenance medication was reviewed in detail including emphasizing most importantly the difference between maintenance and prns and under what circumstances the prns are to be triggered using an action plan format where appropriate. ? ?Total time for H and P, chart review, counseling, reviewing hfa  device(s) and generating customized AVS unique to this office visit / same day charting = 21 min  ?     ?

## 2021-10-20 ENCOUNTER — Other Ambulatory Visit: Payer: Self-pay | Admitting: Internal Medicine

## 2021-10-20 ENCOUNTER — Other Ambulatory Visit (HOSPITAL_COMMUNITY): Payer: Self-pay

## 2021-10-20 MED ORDER — BUDESONIDE-FORMOTEROL FUMARATE 160-4.5 MCG/ACT IN AERO
2.0000 | INHALATION_SPRAY | Freq: Two times a day (BID) | RESPIRATORY_TRACT | 11 refills | Status: DC
Start: 2021-10-20 — End: 2022-10-24
  Filled 2021-10-20: qty 10.2, 30d supply, fill #0
  Filled 2021-11-20: qty 10.2, 30d supply, fill #1
  Filled 2021-12-16: qty 10.2, 30d supply, fill #2
  Filled 2022-01-15: qty 10.2, 30d supply, fill #3
  Filled 2022-03-02: qty 10.2, 30d supply, fill #4
  Filled 2022-04-04: qty 10.2, 30d supply, fill #5
  Filled 2022-05-02: qty 10.2, 30d supply, fill #6
  Filled 2022-05-30: qty 10.2, 30d supply, fill #7
  Filled 2022-06-28: qty 10.2, 30d supply, fill #8
  Filled 2022-07-26 – 2022-08-02 (×10): qty 10.2, 30d supply, fill #9
  Filled 2022-08-28: qty 10.2, 30d supply, fill #10
  Filled 2022-09-25: qty 10.2, 30d supply, fill #11

## 2021-10-21 ENCOUNTER — Other Ambulatory Visit (HOSPITAL_COMMUNITY): Payer: Self-pay

## 2021-11-20 ENCOUNTER — Other Ambulatory Visit (HOSPITAL_COMMUNITY): Payer: Self-pay

## 2021-11-22 ENCOUNTER — Other Ambulatory Visit (HOSPITAL_COMMUNITY): Payer: Self-pay

## 2021-11-23 ENCOUNTER — Other Ambulatory Visit (HOSPITAL_COMMUNITY): Payer: Self-pay

## 2021-12-07 ENCOUNTER — Ambulatory Visit (INDEPENDENT_AMBULATORY_CARE_PROVIDER_SITE_OTHER): Payer: Medicare Other

## 2021-12-07 ENCOUNTER — Ambulatory Visit (INDEPENDENT_AMBULATORY_CARE_PROVIDER_SITE_OTHER): Payer: Medicare Other | Admitting: Podiatry

## 2021-12-07 ENCOUNTER — Encounter: Payer: Self-pay | Admitting: Podiatry

## 2021-12-07 ENCOUNTER — Other Ambulatory Visit (HOSPITAL_COMMUNITY): Payer: Self-pay

## 2021-12-07 DIAGNOSIS — M21612 Bunion of left foot: Secondary | ICD-10-CM

## 2021-12-07 DIAGNOSIS — M2012 Hallux valgus (acquired), left foot: Secondary | ICD-10-CM

## 2021-12-07 DIAGNOSIS — L84 Corns and callosities: Secondary | ICD-10-CM

## 2021-12-07 DIAGNOSIS — M7752 Other enthesopathy of left foot: Secondary | ICD-10-CM | POA: Diagnosis not present

## 2021-12-07 MED ORDER — TRIAMCINOLONE ACETONIDE 10 MG/ML IJ SUSP
10.0000 mg | Freq: Once | INTRAMUSCULAR | Status: AC
  Administered 2021-12-07: 10 mg

## 2021-12-07 NOTE — Progress Notes (Signed)
Subjective:   Patient ID: Mallory Chavez, female   DOB: 86 y.o.   MRN: [id number]   HPI Patient presents stating that she has developed a painful callus on her left foot and has pain in the big toe joint left   ROS      Objective:  Physical Exam  Vascular status intact with keratotic lesion on the medial side of the first metatarsal head left and inflammation of the joint surface both medial and lateral and periarticular     Assessment:  Inflammatory capsulitis of the first MPJ left with the possibility for lesion that also is contributory to extreme pain the patient is experiencing in this area     Plan:  HP x-ray reviewed I went ahead today I did a periarticular injection first MPJ 3 mg Dexasone Kenalog 5 mg Xylocaine I debrided the lesion on the left first metatarsal slight bleeding I applied sterile dressing with Neosporin instructed on leaving this on for 24 hours and it should heal uneventfully as the tissue was very connected into the underlying tissue  X-rays indicate that there is some elevation of the intermetatarsal angle left with spurring around the first metatarsal head

## 2021-12-16 ENCOUNTER — Other Ambulatory Visit (HOSPITAL_COMMUNITY): Payer: Self-pay

## 2021-12-21 ENCOUNTER — Ambulatory Visit: Payer: Self-pay | Admitting: Surgery

## 2021-12-22 ENCOUNTER — Other Ambulatory Visit (HOSPITAL_COMMUNITY): Payer: Self-pay

## 2021-12-22 MED ORDER — LIDOCAINE-PRILOCAINE 2.5-2.5 % EX CREA
TOPICAL_CREAM | CUTANEOUS | 11 refills | Status: DC
Start: 2021-12-22 — End: 2023-08-29
  Filled 2021-12-22: qty 30, 30d supply, fill #0
  Filled 2022-07-26 – 2022-08-02 (×5): qty 30, 30d supply, fill #1
  Filled 2022-10-24: qty 30, 30d supply, fill #2

## 2022-01-15 ENCOUNTER — Other Ambulatory Visit (HOSPITAL_COMMUNITY): Payer: Self-pay

## 2022-01-18 ENCOUNTER — Encounter (HOSPITAL_COMMUNITY): Payer: Self-pay | Admitting: Surgery

## 2022-01-18 DIAGNOSIS — N2581 Secondary hyperparathyroidism of renal origin: Principal | ICD-10-CM | POA: Diagnosis present

## 2022-01-18 NOTE — H&P (Signed)
REFERRING PHYSICIAN: Dr. Carrolyn Meiers and Dr. Pearson Grippe  PROVIDER: Neviah Braud Charlotta Newton, MD   Chief Complaint: New Consultation (Secondary hyperparathyroidism, ESRD)  History of Present Illness:  Patient is referred by Dr. [name] and by Dr. Pearson Grippe for surgical evaluation and management of secondary hyperparathyroidism of renal origin. Patient initiated hemodialysis in January 2020. She dialyzes on Mondays Wednesdays and Fridays at the [facility]. She is using an access in the upper left arm. Patient had been evaluated in my practice in June 2019. At that time we had obtained an ultrasound examination of the neck as well as a nuclear medicine parathyroid scan. Neither of these studies localized an adenoma. Patient does have known thyroid nodules. Patient now has laboratory evidence consistent with secondary hyperparathyroidism. Calcium level has ranged from 9.2-11.2 within the last 2 months. Likewise her intact PTH level has ranged from 1765 to as high as 2112. Phosphorus level is elevated at 5.4. Patient complains of fatigue. She complains of neck pain. She complains of bone and joint pain. She presents today accompanied by her daughter for surgical evaluation.  Review of Systems: A complete review of systems was obtained from the patient. I have reviewed this information and discussed as appropriate with the patient. See HPI as well for other ROS.  Review of Systems  Constitutional: Positive for malaise/fatigue.  HENT: Negative.  Eyes: Negative.  Respiratory: Negative.  Cardiovascular: Negative.  Gastrointestinal: Negative.  Genitourinary: Negative.  Musculoskeletal: Positive for joint pain and neck pain.  Skin: Negative.  Neurological: Negative.  Endo/Heme/Allergies: Negative.  Psychiatric/Behavioral: Negative.   Medical History: Past Medical History:  Diagnosis Date  Arthritis  Chronic kidney disease  COPD (chronic obstructive pulmonary disease)  (CMS-HCC)   Patient Active Problem List  Diagnosis  Secondary hyperparathyroidism of renal origin (CMS-HCC)   History reviewed. No pertinent surgical history.   No Known Allergies  Current Outpatient Medications on File Prior to Visit  Medication Sig Dispense Refill  colchicine (COLCRYS) 0.6 mg tablet Take 1 tablet by mouth once daily as needed  cyclobenzaprine (FLEXERIL) 5 MG tablet Take 1 tablet by mouth at bedtime  DIALYVITE 800 WITH ZINC 50 0.8-50 mg Tab Take by mouth  famotidine (PEPCID) 40 MG tablet Take 1 tablet by mouth once daily  gabapentin (NEURONTIN) 100 MG capsule Take 100 mg by mouth at bedtime  aspirin 81 MG EC tablet Take by mouth   No current facility-administered medications on file prior to visit.   Family History  Problem Relation Age of Onset  High blood pressure (Hypertension) Mother  Deep vein thrombosis (DVT or abnormal blood clot formation) Mother    Social History   Tobacco Use  Smoking Status Former  Types: Cigarettes  Smokeless Tobacco Never    Social History   Socioeconomic History  Marital status: Widowed  Tobacco Use  Smoking status: Former  Types: Cigarettes  Smokeless tobacco: Never  Vaping Use  Vaping Use: Never used  Substance and Sexual Activity  Alcohol use: Never  Drug use: Never   Objective:   Vitals:   BP: 126/64  Temp: 36.7 C (98.1 F)  Weight: 65 kg (143 lb 6.4 oz)  Height: 162.6 cm ('5\' 4"'$ )   Body mass index is 24.61 kg/m.  Physical Exam   GENERAL APPEARANCE Comfortable, no acute issues Development: normal Gross deformities: none  SKIN Rash, lesions, ulcers: none Induration, erythema: none Nodules: none palpable  EYES Conjunctiva and lids: normal Pupils: equal and reactive  EARS, NOSE,  MOUTH, THROAT External ears: no lesion or deformity External nose: no lesion or deformity Hearing: grossly normal  NECK Symmetric: yes Trachea: midline Thyroid: no palpable nodules in the thyroid  bed  CHEST Respiratory effort: normal Retraction or accessory muscle use: no Breath sounds: normal bilaterally Rales, rhonchi, wheeze: Few expiratory wheeze  CARDIOVASCULAR Auscultation: regular rhythm, normal rate Murmurs: none Pulses: radial pulse 2+ palpable Lower extremity edema: none  ABDOMEN Not assessed  GENITOURINARY Not assessed  MUSCULOSKELETAL Station and gait: normal Digits and nails: no clubbing or cyanosis Muscle strength: grossly normal all extremities Range of motion: grossly normal all extremities Deformity: none  LYMPHATIC Cervical: none palpable Supraclavicular: none palpable  PSYCHIATRIC Oriented to person, place, and time: yes Mood and affect: normal for situation Judgment and insight: appropriate for situation    Assessment and Plan:   Secondary hyperparathyroidism of renal origin (CMS-HCC)  Patient is referred by her endocrinologist and her nephrologist for surgical consideration for parathyroidectomy for management of secondary hyperparathyroidism.  Patient provided with a copy of "Parathyroid Surgery: Treatment for Your Parathyroid Gland Problem", published by [employer], 12 pages. Book reviewed and explained to patient during visit today.  Patient has biochemical evidence of secondary hyperparathyroidism due to her underlying chronic kidney disease. I have recommended proceeding with neck exploration with parathyroidectomy. If this is 4 gland disease, then she will require autotransplantation to the right forearm. We discussed the procedure, the hospital stay to be anticipated, the size and location of the surgical incisions, and her postoperative recovery. The patient and her daughter understand and agree to proceed.   Armandina Gemma, MD [facility] [place] [facility] Office: [phone]

## 2022-01-22 NOTE — Pre-Procedure Instructions (Addendum)
Surgical Instructions    Your procedure is scheduled on Thursday, July 27th.  Report to [facility] Main Entrance "A" at 6:30 A.M., then check in with the Admitting office.  Call this number if you have problems the morning of surgery:  [phone]   If you have any questions prior to your surgery date call [phone]: Open Monday-Friday 8am-4pm    Remember:  Do not eat after midnight the night before your surgery  You may drink clear liquids until 5:30 a.m. the morning of your surgery.   Clear liquids allowed are: Water, Non-Citrus Juices (without pulp), Carbonated Beverages, Clear Tea, Black Coffee ONLY (NO MILK, CREAM OR POWDERED CREAMER of any kind), and Gatorade    Take these medicines the morning of surgery with A SIP OF WATER:  budesonide-formoterol (SYMBICORT) famotidine (PEPCID)   Take these medications as needed: acetaminophen (TYLENOL)  Albuterol inhaler-Please bring all inhalers with you the day of surgery.  colchicine   Follow your surgeon's instructions on when to stop Aspirin.  If no instructions were given by your surgeon then you will need to call the office to get those instructions.    As of today, STOP taking any Aleve, Naproxen, Ibuprofen, Motrin, Advil, Goody's, BC's, all herbal medications, fish oil, and all vitamins.           Do not wear jewelry or makeup Do not wear lotions, powders, perfumes, or deodorant. Do not shave 48 hours prior to surgery.  Do not bring valuables to the hospital. Do not wear nail polish, gel polish, artificial nails, or any other type of covering on natural nails (fingers and toes) If you have artificial nails or gel coating that need to be removed by a nail salon, please have this removed prior to surgery. Artificial nails or gel coating may interfere with anesthesia's ability to adequately monitor your vital signs.  [facility] is not responsible for any belongings or valuables. .   Do NOT Smoke (Tobacco/Vaping)  24 hours  prior to your procedure  If you use a CPAP at night, you may bring your mask for your overnight stay.   Contacts, glasses, hearing aids, dentures or partials may not be worn into surgery, please bring cases for these belongings   For patients admitted to the hospital, discharge time will be determined by your treatment team.   Patients discharged the day of surgery will not be allowed to drive home, and someone needs to stay with them for 24 hours.   SURGICAL WAITING ROOM VISITATION Patients having surgery or a procedure may have no more than 2 support people in the waiting area - these visitors may rotate.   Children under the age of 72 must have an adult with them who is not the patient. If the patient needs to stay at the hospital during part of their recovery, the visitor guidelines for inpatient rooms apply. Pre-op nurse will coordinate an appropriate time for 1 support person to accompany patient in pre-op.  This support person may not rotate.   Please refer to the [facility] website for the visitor guidelines for Inpatients (after your surgery is over and you are in a regular room).    Special instructions:    Oral Hygiene is also important to reduce your risk of infection.  Remember - BRUSH YOUR TEETH THE MORNING OF SURGERY WITH YOUR REGULAR TOOTHPASTE   [facility]- Preparing For Surgery  Before surgery, you can play an important role. Because skin is not sterile, your skin  needs to be as free of germs as possible. You can reduce the number of germs on your skin by washing with CHG (chlorahexidine gluconate) Soap before surgery.  CHG is an antiseptic cleaner which kills germs and bonds with the skin to continue killing germs even after washing.     Please do not use if you have an allergy to CHG or antibacterial soaps. If your skin becomes reddened/irritated stop using the CHG.  Do not shave (including legs and underarms) for at least 48 hours prior to first CHG shower. It is  OK to shave your face.  Please follow these instructions carefully.     Shower the NIGHT BEFORE SURGERY and the MORNING OF SURGERY with CHG Soap.   If you chose to wash your hair, wash your hair first as usual with your normal shampoo. After you shampoo, rinse your hair and body thoroughly to remove the shampoo.  Then [employer] and genitals (private parts) with your normal soap and rinse thoroughly to remove soap.  After that Use CHG Soap as you would any other liquid soap. You can apply CHG directly to the skin and wash gently with a scrungie or a clean washcloth.   Apply the CHG Soap to your body ONLY FROM THE NECK DOWN.  Do not use on open wounds or open sores. Avoid contact with your eyes, ears, mouth and genitals (private parts). Wash Face and genitals (private parts)  with your normal soap.   Wash thoroughly, paying special attention to the area where your surgery will be performed.  Thoroughly rinse your body with warm water from the neck down.  DO NOT shower/wash with your normal soap after using and rinsing off the CHG Soap.  Pat yourself dry with a CLEAN TOWEL.  Wear CLEAN PAJAMAS to bed the night before surgery  Place CLEAN SHEETS on your bed the night before your surgery  DO NOT SLEEP WITH PETS.   Day of Surgery:  Take a shower with CHG soap. Wear Clean/Comfortable clothing the morning of surgery Do not apply any deodorants/lotions.   Remember to brush your teeth WITH YOUR REGULAR TOOTHPASTE.    If you received a COVID test during your pre-op visit, it is requested that you wear a mask when out in public, stay away from anyone that may not be feeling well, and notify your surgeon if you develop symptoms. If you have been in contact with anyone that has tested positive in the last 10 days, please notify your surgeon.    Please read over the following fact sheets that you were given.

## 2022-01-23 ENCOUNTER — Encounter (HOSPITAL_COMMUNITY): Payer: Self-pay

## 2022-01-23 ENCOUNTER — Other Ambulatory Visit: Payer: Self-pay

## 2022-01-23 ENCOUNTER — Encounter (HOSPITAL_COMMUNITY)
Admission: RE | Admit: 2022-01-23 | Discharge: 2022-01-23 | Disposition: A | Discharge status: Home / Self Care | Payer: Medicare Other | Source: Ambulatory Visit | Attending: Surgery | Admitting: Surgery

## 2022-01-23 VITALS — BP 121/61 | HR 89 | Temp 97.9°F | Resp 17 | Ht 64.0 in | Wt 142.7 lb

## 2022-01-23 DIAGNOSIS — Z01818 Encounter for other preprocedural examination: Secondary | ICD-10-CM | POA: Diagnosis present

## 2022-01-23 DIAGNOSIS — I5032 Chronic diastolic (congestive) heart failure: Secondary | ICD-10-CM | POA: Diagnosis not present

## 2022-01-23 DIAGNOSIS — Z992 Dependence on renal dialysis: Secondary | ICD-10-CM | POA: Diagnosis not present

## 2022-01-23 NOTE — Progress Notes (Signed)
CBC not drawn at PAT appt. Pt stated she gets blood work drawn at her HD sessions 3 times a week, and would rather not be "stuck" unless it's deemed necessary. Due to pt recently having blood work drawn on 7/12, Jeneen Rinks, APP, reviewed and agreed that those labs would be substantial for surgery and no CBC needed today. Order in for [employer],  7/12 Labs (CE) from [facility]:  HGB 11.6 g/dL Males: 14.0 - 18.0 g/dL Females: 12.0 - 16.0 g/dL  Low January 10, 2022  RDW 16.7 % No Reference range provided High January 10, 2022  Platelets 211 1000/mcL [phone]/mcL  - January 10, 2022  Hemoglobin x 3 34.8 % Female: 14.0 - 18.0 g/dL; Female: 12.0-16.0 g/dL Low January 10, 2022  Neutrophils 66.5 % 40.0-75.0% - January 10, 2022  Lymphocytes 22.1 % 19.0-48.0% - January 10, 2022  Monocytes 7.2 % 3.0-10.0% - January 10, 2022  HCT 37.1 % Males: 42 - 52% Females: 37 - 47%  - January 10, 2022  RBC 3.56 mill/mcL Males: 4.70 - 6.10 mill/mcL Females: 4.20 - 5.40 mill/mcL  Low January 10, 2022  UIBC (Calc) 189 mcg/dL 155-355 mcg/dL  - January 10, 2022  MCH 32.5 pg 27 - 31 pg/cell High January 10, 2022  Iron 53 mcg/dL Females: 30-160 mcg/dL Males: 45-160 mcg/dL  - January 10, 2022  Basophils 0.6 % 0.0-1.5% - January 10, 2022  Eosinophil 1.9 % 0.0-7.0% - January 10, 2022  WBC (No Diff) 7.76 1000/mcL 4.8-10.8 thous/mcL  - January 10, 2022  LUC 1.6 % 0.0-4.0% - January 10, 2022  TIBC 242 mcg/dL 185-515 mcg/dL  - January 10, 2022  Ferritin 982 ng/mL Males: 22 - 322 ng/mL; Females: 10 - 291 ng/mL High January 10, 2022  Transferrin Sat. (Calc) 22 % 20-55%  - January 10, 2022  MCHC 31.1 g/dL 30 - 36 g/dL - January 10, 2022  HGB 11.2 g/dL Males: 14.0 - 18.0 g/dL Females: 12.0 - 16.0 g/dL  Low January 17, 2022  Hemoglobin x 3 33.6 % Female: 14.0 - 18.0 g/dL; Female: 12.0-16.0 g/dL Low January 17, 2022

## 2022-01-23 NOTE — Progress Notes (Signed)
PCP - Dr. [name] Cardiologist - denies  PPM/ICD - denies   Chest x-ray - 07/23/18 EKG - 01/23/22 Stress Test - 5 years ago per pt, no abnormalities ECHO - 07/21/18 Cardiac Cath - denies  Sleep Study - denies   DM- denies  Blood Thinner Instructions: n/a Aspirin Instructions: f/u with surgeon for instructions  ERAS Protcol - yes, no drink   COVID TEST- n/a   Anesthesia review: no  Patient denies shortness of breath, fever, cough and chest pain at PAT appointment   All instructions explained to the patient, with a verbal understanding of the material. Patient agrees to go over the instructions while at home for a better understanding.  The opportunity to ask questions was provided.

## 2022-01-24 NOTE — Anesthesia Preprocedure Evaluation (Signed)
Anesthesia Evaluation  Patient identified by MRN, date of birth, ID band Patient awake    Reviewed: Allergy & Precautions, Patient's Chart, lab work & pertinent test results  Airway Mallampati: II  TM Distance: >3 FB     Dental   Pulmonary asthma , COPD,  COPD inhaler and oxygen dependent, former smoker,  Quit smoking 1994, 30 pack year history    breath sounds clear to auscultation       Cardiovascular hypertension, pulmonary hypertension (mod pHTN)+CHF (grade 1 diastolic dysfunction, LVEF 50%) and + DOE  + Valvular Problems/Murmurs (mild AS) AS  Rhythm:Regular Rate:Normal  Echo 2020 - Left ventricle: The cavity size was normal. Wall thickness was  increased in a pattern of moderate LVH. The estimated ejection  fraction was 50%. Diffuse hypokinesis. Doppler parameters are  consistent with abnormal left ventricular relaxation (grade 1  diastolic dysfunction).  - Aortic valve: Trileaflet; moderately calcified leaflets. There  was mild stenosis. Mean gradient (S): 12 mm Hg.  - Mitral valve: There was trivial regurgitation.  - Left atrium: The atrium was mildly dilated.  - Right ventricle: The cavity size was normal. Systolic function  was normal.  - Tricuspid valve: Peak RV-RA gradient (S): 54 mm Hg.  - Pulmonary arteries: PA peak pressure: 62 mm Hg (S).  - Systemic veins: IVC measured 2.2 cm with > 50% respirophasic  variation, suggesting RA pressure 8 mmHg.  - Pericardium, extracardiac: A trivial pericardial effusion was  identified.    Neuro/Psych negative neurological ROS  negative psych ROS   GI/Hepatic Neg liver ROS, GERD  ,  Endo/Other  Secondary hyperparathyroidism d/t ESRD  Renal/GU ESRF and DialysisRenal disease  negative genitourinary   Musculoskeletal  (+) Arthritis , Osteoarthritis,    Abdominal   Peds  Hematology negative hematology ROS (+)   Anesthesia Other Findings    Reproductive/Obstetrics negative OB ROS                            Anesthesia Physical Anesthesia Plan  ASA: 4  Anesthesia Plan: General   Post-op Pain Management: Tylenol PO (pre-op)*   Induction: Intravenous  PONV Risk Score and Plan: 4 or greater and Ondansetron, Dexamethasone and Treatment may vary due to age or medical condition  Airway Management Planned: Oral ETT  Additional Equipment: None  Intra-op Plan:   Post-operative Plan: Extubation in OR  Informed Consent: I have reviewed the patients History and Physical, chart, labs and discussed the procedure including the risks, benefits and alternatives for the proposed anesthesia with the patient or authorized representative who has indicated his/her understanding and acceptance.     Dental advisory given  Plan Discussed with: CRNA and Anesthesiologist  Anesthesia Plan Comments:        Anesthesia Quick Evaluation

## 2022-01-25 ENCOUNTER — Ambulatory Visit (HOSPITAL_COMMUNITY): Payer: Medicare Other

## 2022-01-25 ENCOUNTER — Encounter (HOSPITAL_COMMUNITY): Payer: Self-pay | Admitting: Surgery

## 2022-01-25 ENCOUNTER — Other Ambulatory Visit: Payer: Self-pay

## 2022-01-25 ENCOUNTER — Encounter (HOSPITAL_COMMUNITY)
Admission: RE | Disposition: A | Discharge status: Home / Self Care | Payer: Self-pay | Source: Home / Self Care | Attending: Surgery

## 2022-01-25 ENCOUNTER — Ambulatory Visit (HOSPITAL_COMMUNITY): Payer: Medicare Other | Admitting: Physician Assistant

## 2022-01-25 ENCOUNTER — Inpatient Hospital Stay (HOSPITAL_COMMUNITY)
Admission: RE | Admit: 2022-01-25 | Discharge: 2022-01-31 | DRG: 674 | Disposition: A | Discharge status: Home / Self Care | Payer: Medicare Other | Attending: Surgery | Admitting: Surgery

## 2022-01-25 DIAGNOSIS — R110 Nausea: Secondary | ICD-10-CM | POA: Diagnosis not present

## 2022-01-25 DIAGNOSIS — Z79899 Other long term (current) drug therapy: Secondary | ICD-10-CM

## 2022-01-25 DIAGNOSIS — M1990 Unspecified osteoarthritis, unspecified site: Secondary | ICD-10-CM | POA: Diagnosis present

## 2022-01-25 DIAGNOSIS — I132 Hypertensive heart and chronic kidney disease with heart failure and with stage 5 chronic kidney disease, or end stage renal disease: Secondary | ICD-10-CM

## 2022-01-25 DIAGNOSIS — I509 Heart failure, unspecified: Secondary | ICD-10-CM

## 2022-01-25 DIAGNOSIS — Z90710 Acquired absence of both cervix and uterus: Secondary | ICD-10-CM

## 2022-01-25 DIAGNOSIS — D631 Anemia in chronic kidney disease: Secondary | ICD-10-CM | POA: Diagnosis present

## 2022-01-25 DIAGNOSIS — Z992 Dependence on renal dialysis: Secondary | ICD-10-CM

## 2022-01-25 DIAGNOSIS — N2581 Secondary hyperparathyroidism of renal origin: Secondary | ICD-10-CM | POA: Diagnosis not present

## 2022-01-25 DIAGNOSIS — E042 Nontoxic multinodular goiter: Secondary | ICD-10-CM | POA: Diagnosis present

## 2022-01-25 DIAGNOSIS — J449 Chronic obstructive pulmonary disease, unspecified: Secondary | ICD-10-CM | POA: Diagnosis present

## 2022-01-25 DIAGNOSIS — E785 Hyperlipidemia, unspecified: Secondary | ICD-10-CM | POA: Diagnosis present

## 2022-01-25 DIAGNOSIS — Z7951 Long term (current) use of inhaled steroids: Secondary | ICD-10-CM

## 2022-01-25 DIAGNOSIS — N186 End stage renal disease: Secondary | ICD-10-CM | POA: Diagnosis present

## 2022-01-25 DIAGNOSIS — M898X9 Other specified disorders of bone, unspecified site: Secondary | ICD-10-CM | POA: Diagnosis present

## 2022-01-25 DIAGNOSIS — I120 Hypertensive chronic kidney disease with stage 5 chronic kidney disease or end stage renal disease: Secondary | ICD-10-CM | POA: Diagnosis present

## 2022-01-25 DIAGNOSIS — Z87891 Personal history of nicotine dependence: Secondary | ICD-10-CM

## 2022-01-25 DIAGNOSIS — Z7982 Long term (current) use of aspirin: Secondary | ICD-10-CM

## 2022-01-25 DIAGNOSIS — R42 Dizziness and giddiness: Secondary | ICD-10-CM | POA: Diagnosis not present

## 2022-01-25 HISTORY — PX: PARATHYROID EXPLORATION: SHX732

## 2022-01-25 HISTORY — DX: Dependence on renal dialysis: N18.6

## 2022-01-25 LAB — POCT I-STAT, CHEM 8
BUN: 42 mg/dL — ABNORMAL HIGH (ref 8–23)
BUN: 30 mg/dL — ABNORMAL HIGH (ref 8–23)
Calcium, Ion: 0.92 mmol/L — ABNORMAL LOW (ref 1.15–1.40)
Calcium, Ion: 1.01 mmol/L — ABNORMAL LOW (ref 1.15–1.40)
Chloride: 99 mmol/L (ref 98–111)
Chloride: 97 mmol/L — ABNORMAL LOW (ref 98–111)
Creatinine, Ser: 6.2 mg/dL — ABNORMAL HIGH (ref 0.44–1.00)
Creatinine, Ser: 6.6 mg/dL — ABNORMAL HIGH (ref 0.44–1.00)
Glucose, Bld: 89 mg/dL (ref 70–99)
Glucose, Bld: 95 mg/dL (ref 70–99)
HCT: 36 % (ref 36.0–46.0)
HCT: 37 % (ref 36.0–46.0)
Hemoglobin: 12.2 g/dL (ref 12.0–15.0)
Hemoglobin: 12.6 g/dL (ref 12.0–15.0)
Potassium: 5.7 mmol/L — ABNORMAL HIGH (ref 3.5–5.1)
Potassium: 4.1 mmol/L (ref 3.5–5.1)
Sodium: 135 mmol/L (ref 135–145)
Sodium: 136 mmol/L (ref 135–145)
TCO2: 30 mmol/L (ref 22–32)
TCO2: 29 mmol/L (ref 22–32)

## 2022-01-25 LAB — HEPATITIS C ANTIBODY: HCV Ab: NONREACTIVE

## 2022-01-25 LAB — RENAL FUNCTION PANEL
Albumin: 4 g/dL (ref 3.5–5.0)
Anion gap: 18 — ABNORMAL HIGH (ref 5–15)
BUN: 33 mg/dL — ABNORMAL HIGH (ref 8–23)
CO2: 28 mmol/L (ref 22–32)
Calcium: 8.6 mg/dL — ABNORMAL LOW (ref 8.9–10.3)
Chloride: 92 mmol/L — ABNORMAL LOW (ref 98–111)
Creatinine, Ser: 6.72 mg/dL — ABNORMAL HIGH (ref 0.44–1.00)
GFR, Estimated: 6 mL/min — ABNORMAL LOW (ref >60)
Glucose, Bld: 133 mg/dL — ABNORMAL HIGH (ref 70–99)
Phosphorus: 4.5 mg/dL (ref 2.5–4.6)
Potassium: 5.1 mmol/L (ref 3.5–5.1)
Sodium: 138 mmol/L (ref 135–145)

## 2022-01-25 LAB — HEPATITIS B SURFACE ANTIBODY,QUALITATIVE: Hep B S Ab: REACTIVE — AB

## 2022-01-25 LAB — HEPATITIS B CORE ANTIBODY, TOTAL: Hep B Core Total Ab: NONREACTIVE

## 2022-01-25 LAB — HEPATITIS B SURFACE ANTIGEN: Hepatitis B Surface Ag: NONREACTIVE

## 2022-01-25 SURGERY — EXPLORATION, PARATHYROID
Anesthesia: General | Site: Neck | Laterality: Right

## 2022-01-25 MED ORDER — LIDOCAINE 2% (20 MG/ML) 5 ML SYRINGE
INTRAMUSCULAR | Status: DC | PRN
  Administered 2022-01-25: 100 mg via INTRAVENOUS

## 2022-01-25 MED ORDER — PHENYLEPHRINE HCL-NACL 20-0.9 MG/250ML-% IV SOLN
INTRAVENOUS | Status: DC | PRN
  Administered 2022-01-25: 40 ug/min via INTRAVENOUS

## 2022-01-25 MED ORDER — OXYCODONE HCL 5 MG PO TABS: 5.0000 mg | ORAL_TABLET | Freq: Once | ORAL | Status: DC | PRN

## 2022-01-25 MED ORDER — GABAPENTIN 100 MG PO CAPS
100.0000 mg | ORAL_CAPSULE | Freq: Every day | ORAL | Status: DC
  Administered 2022-01-25 – 2022-01-30 (×6): 100 mg via ORAL
  Filled 2022-01-25 (×6): qty 1

## 2022-01-25 MED ORDER — 0.9 % SODIUM CHLORIDE (POUR BTL) OPTIME
TOPICAL | Status: DC | PRN
  Administered 2022-01-25: 1000 mL

## 2022-01-25 MED ORDER — SEVELAMER CARBONATE 800 MG PO TABS
1600.0000 mg | ORAL_TABLET | Freq: Three times a day (TID) | ORAL | Status: DC
  Administered 2022-01-25 – 2022-01-26 (×2): 1600 mg via ORAL
  Filled 2022-01-25 (×2): qty 2

## 2022-01-25 MED ORDER — CEFAZOLIN SODIUM-DEXTROSE 2-4 GM/100ML-% IV SOLN
2.0000 g | INTRAVENOUS | Status: AC
  Administered 2022-01-25: 2 g via INTRAVENOUS
  Filled 2022-01-25: qty 100

## 2022-01-25 MED ORDER — PROPOFOL 10 MG/ML IV BOLUS
INTRAVENOUS | Status: DC | PRN
  Administered 2022-01-25: 100 mg via INTRAVENOUS

## 2022-01-25 MED ORDER — FENTANYL CITRATE (PF) 100 MCG/2ML IJ SOLN
25.0000 ug | INTRAMUSCULAR | Status: DC | PRN
  Administered 2022-01-25: 25 ug via INTRAVENOUS
  Administered 2022-01-25: 50 ug via INTRAVENOUS
  Administered 2022-01-25: 25 ug via INTRAVENOUS
  Administered 2022-01-25: 50 ug via INTRAVENOUS

## 2022-01-25 MED ORDER — MOMETASONE FURO-FORMOTEROL FUM 200-5 MCG/ACT IN AERO
2.0000 | INHALATION_SPRAY | Freq: Two times a day (BID) | RESPIRATORY_TRACT | Status: DC
  Administered 2022-01-26 – 2022-01-30 (×8): 2 via RESPIRATORY_TRACT
  Filled 2022-01-25: qty 8.8

## 2022-01-25 MED ORDER — PROPOFOL 10 MG/ML IV BOLUS
INTRAVENOUS | Status: AC
  Filled 2022-01-25: qty 20

## 2022-01-25 MED ORDER — CHLORHEXIDINE GLUCONATE CLOTH 2 % EX PADS
6.0000 | MEDICATED_PAD | Freq: Every day | CUTANEOUS | Status: DC
Start: 2022-01-26 — End: 2022-01-31
  Administered 2022-01-26 – 2022-01-30 (×4): 6 via TOPICAL

## 2022-01-25 MED ORDER — DEXAMETHASONE SODIUM PHOSPHATE 10 MG/ML IJ SOLN
INTRAMUSCULAR | Status: DC | PRN
  Administered 2022-01-25: 10 mg via INTRAVENOUS

## 2022-01-25 MED ORDER — TRAMADOL HCL 50 MG PO TABS
50.0000 mg | ORAL_TABLET | Freq: Four times a day (QID) | ORAL | Status: DC | PRN
  Administered 2022-01-25: 50 mg via ORAL
  Filled 2022-01-25: qty 1

## 2022-01-25 MED ORDER — SODIUM CHLORIDE 0.9 % IV SOLN
INTRAVENOUS | Status: DC
Start: 2022-01-25 — End: 2022-01-31

## 2022-01-25 MED ORDER — CALCIUM CARBONATE ANTACID 1250 MG/5ML PO SUSP
1000.0000 mg | Freq: Three times a day (TID) | ORAL | Status: DC
  Administered 2022-01-25 – 2022-01-26 (×2): 1000 mg via ORAL
  Filled 2022-01-25 (×4): qty 5

## 2022-01-25 MED ORDER — FENTANYL CITRATE (PF) 100 MCG/2ML IJ SOLN
INTRAMUSCULAR | Status: AC
  Filled 2022-01-25: qty 2

## 2022-01-25 MED ORDER — HEMOSTATIC AGENTS (NO CHARGE) OPTIME
TOPICAL | Status: DC | PRN
  Administered 2022-01-25: 1 via TOPICAL

## 2022-01-25 MED ORDER — SODIUM CHLORIDE 0.9 % IV SOLN: INTRAVENOUS | Status: DC

## 2022-01-25 MED ORDER — PHENYLEPHRINE 80 MCG/ML (10ML) SYRINGE FOR IV PUSH (FOR BLOOD PRESSURE SUPPORT)
PREFILLED_SYRINGE | INTRAVENOUS | Status: DC | PRN
  Administered 2022-01-25 (×2): 160 ug via INTRAVENOUS

## 2022-01-25 MED ORDER — ONDANSETRON 4 MG PO TBDP
4.0000 mg | ORAL_TABLET | Freq: Four times a day (QID) | ORAL | Status: DC | PRN
  Administered 2022-01-27 – 2022-01-28 (×3): 4 mg via ORAL
  Filled 2022-01-25 (×3): qty 1

## 2022-01-25 MED ORDER — LACTATED RINGERS IV SOLN: INTRAVENOUS | Status: DC

## 2022-01-25 MED ORDER — OXYCODONE HCL 5 MG PO TABS
5.0000 mg | ORAL_TABLET | ORAL | Status: DC | PRN
  Administered 2022-01-26: 10 mg via ORAL
  Administered 2022-01-26: 5 mg via ORAL
  Administered 2022-01-26: 10 mg via ORAL
  Administered 2022-01-27 – 2022-01-28 (×2): 5 mg via ORAL
  Administered 2022-01-29 – 2022-01-31 (×3): 10 mg via ORAL
  Filled 2022-01-25: qty 1
  Filled 2022-01-25 (×2): qty 2
  Filled 2022-01-25: qty 1
  Filled 2022-01-25 (×3): qty 2
  Filled 2022-01-25: qty 1
  Filled 2022-01-25: qty 2

## 2022-01-25 MED ORDER — ACETAMINOPHEN 650 MG RE SUPP: 650.0000 mg | Freq: Four times a day (QID) | RECTAL | Status: DC | PRN

## 2022-01-25 MED ORDER — CALCITRIOL 0.5 MCG PO CAPS
0.5000 ug | ORAL_CAPSULE | Freq: Every day | ORAL | Status: DC
  Administered 2022-01-25: 0.5 ug via ORAL
  Filled 2022-01-25 (×2): qty 1

## 2022-01-25 MED ORDER — FENTANYL CITRATE (PF) 250 MCG/5ML IJ SOLN
INTRAMUSCULAR | Status: DC | PRN
  Administered 2022-01-25: 50 ug via INTRAVENOUS
  Administered 2022-01-25: 100 ug via INTRAVENOUS

## 2022-01-25 MED ORDER — ONDANSETRON HCL 4 MG/2ML IJ SOLN
INTRAMUSCULAR | Status: DC | PRN
  Administered 2022-01-25: 4 mg via INTRAVENOUS

## 2022-01-25 MED ORDER — ONDANSETRON HCL 4 MG/2ML IJ SOLN: 4.0000 mg | Freq: Once | INTRAMUSCULAR | Status: DC | PRN

## 2022-01-25 MED ORDER — FENTANYL CITRATE (PF) 250 MCG/5ML IJ SOLN
INTRAMUSCULAR | Status: AC
  Filled 2022-01-25: qty 5

## 2022-01-25 MED ORDER — ORAL CARE MOUTH RINSE: 15.0000 mL | Freq: Once | OROMUCOSAL | Status: AC

## 2022-01-25 MED ORDER — ACETAMINOPHEN 500 MG PO TABS
1000.0000 mg | ORAL_TABLET | Freq: Once | ORAL | Status: AC
  Administered 2022-01-25: 1000 mg via ORAL
  Filled 2022-01-25: qty 2

## 2022-01-25 MED ORDER — CHLORHEXIDINE GLUCONATE 0.12 % MT SOLN
15.0000 mL | Freq: Once | OROMUCOSAL | Status: AC
  Administered 2022-01-25: 15 mL via OROMUCOSAL
  Filled 2022-01-25: qty 15

## 2022-01-25 MED ORDER — PHENYLEPHRINE HCL-NACL 20-0.9 MG/250ML-% IV SOLN
INTRAVENOUS | Status: AC
Start: 2022-01-25 — End: ?
  Filled 2022-01-25: qty 250

## 2022-01-25 MED ORDER — POLYVINYL ALCOHOL 1.4 % OP SOLN
1.0000 [drp] | Freq: Every day | OPHTHALMIC | Status: DC
  Administered 2022-01-25 – 2022-01-30 (×6): 1 [drp] via OPHTHALMIC
  Filled 2022-01-25: qty 15

## 2022-01-25 MED ORDER — SUGAMMADEX SODIUM 200 MG/2ML IV SOLN
INTRAVENOUS | Status: DC | PRN
  Administered 2022-01-25: 200 mg via INTRAVENOUS

## 2022-01-25 MED ORDER — BUPIVACAINE HCL (PF) 0.25 % IJ SOLN
INTRAMUSCULAR | Status: AC
  Filled 2022-01-25: qty 30

## 2022-01-25 MED ORDER — ROCURONIUM BROMIDE 10 MG/ML (PF) SYRINGE
PREFILLED_SYRINGE | INTRAVENOUS | Status: DC | PRN
  Administered 2022-01-25: 40 mg via INTRAVENOUS
  Administered 2022-01-25: 10 mg via INTRAVENOUS
  Administered 2022-01-25: 20 mg via INTRAVENOUS

## 2022-01-25 MED ORDER — ACETAMINOPHEN 325 MG PO TABS: 650.0000 mg | ORAL_TABLET | Freq: Four times a day (QID) | ORAL | Status: DC | PRN

## 2022-01-25 MED ORDER — ONDANSETRON HCL 4 MG/2ML IJ SOLN
4.0000 mg | Freq: Four times a day (QID) | INTRAMUSCULAR | Status: DC | PRN
  Administered 2022-01-25: 4 mg via INTRAVENOUS
  Filled 2022-01-25: qty 2

## 2022-01-25 MED ORDER — CHLORHEXIDINE GLUCONATE CLOTH 2 % EX PADS: 6.0000 | MEDICATED_PAD | Freq: Once | CUTANEOUS | Status: DC

## 2022-01-25 MED ORDER — HYDROMORPHONE HCL 1 MG/ML IJ SOLN
1.0000 mg | INTRAMUSCULAR | Status: DC | PRN
  Administered 2022-01-25 – 2022-01-27 (×3): 1 mg via INTRAVENOUS
  Filled 2022-01-25 (×3): qty 1

## 2022-01-25 MED ORDER — OXYCODONE HCL 5 MG/5ML PO SOLN: 5.0000 mg | Freq: Once | ORAL | Status: DC | PRN

## 2022-01-25 SURGICAL SUPPLY — 54 items
ADH SKN CLS APL DERMABOND .7 (GAUZE/BANDAGES/DRESSINGS) ×4
BAG COUNTER SPONGE SURGICOUNT (BAG) ×4 IMPLANT
BAG SPNG CNTER NS LX DISP (BAG) ×2
BLADE SURG 15 STRL LF DISP TIS (BLADE) IMPLANT
BLADE SURG 15 STRL SS (BLADE) ×3
CANISTER SUCT 3000ML PPV (MISCELLANEOUS) ×4 IMPLANT
CHLORAPREP W/TINT 10.5 ML (MISCELLANEOUS) ×4 IMPLANT
CLIP VESOCCLUDE MED 6/CT (CLIP) ×4 IMPLANT
CLIP VESOCCLUDE SM WIDE 6/CT (CLIP) ×4 IMPLANT
CNTNR URN SCR LID CUP LEK RST (MISCELLANEOUS) ×3 IMPLANT
CONT SPEC 4OZ STRL OR WHT (MISCELLANEOUS) ×3
COVER SURGICAL LIGHT HANDLE (MISCELLANEOUS) ×4 IMPLANT
DERMABOND ADVANCED (GAUZE/BANDAGES/DRESSINGS) ×2
DERMABOND ADVANCED .7 DNX12 (GAUZE/BANDAGES/DRESSINGS) IMPLANT
DRAPE LAPAROTOMY 100X72 PEDS (DRAPES) ×4 IMPLANT
DRAPE UTILITY XL STRL (DRAPES) ×8 IMPLANT
ELECT CAUTERY BLADE 6.4 (BLADE) ×4 IMPLANT
ELECT REM PT RETURN 9FT ADLT (ELECTROSURGICAL) ×3
ELECTRODE REM PT RTRN 9FT ADLT (ELECTROSURGICAL) ×3 IMPLANT
GAUZE 4X4 16PLY ~~LOC~~+RFID DBL (SPONGE) ×4 IMPLANT
GAUZE SPONGE 2X2 8PLY STRL LF (GAUZE/BANDAGES/DRESSINGS) ×3 IMPLANT
GLOVE SURG ORTHO 8.0 STRL STRW (GLOVE) ×4 IMPLANT
GOWN STRL REUS W/ TWL LRG LVL3 (GOWN DISPOSABLE) ×6 IMPLANT
GOWN STRL REUS W/ TWL XL LVL3 (GOWN DISPOSABLE) ×3 IMPLANT
GOWN STRL REUS W/TWL LRG LVL3 (GOWN DISPOSABLE) ×6
GOWN STRL REUS W/TWL XL LVL3 (GOWN DISPOSABLE) ×3
HEMOSTAT ARISTA ABSORB 3G PWDR (HEMOSTASIS) IMPLANT
HEMOSTAT SURGICEL 2X4 FIBR (HEMOSTASIS) ×4 IMPLANT
ILLUMINATOR WAVEGUIDE N/F (MISCELLANEOUS) ×1 IMPLANT
KIT BASIN OR (CUSTOM PROCEDURE TRAY) ×4 IMPLANT
KIT TURNOVER KIT B (KITS) ×4 IMPLANT
NDL HYPO 25GX1X1/2 BEV (NEEDLE) IMPLANT
NEEDLE HYPO 25GX1X1/2 BEV (NEEDLE) ×3 IMPLANT
NS IRRIG 1000ML POUR BTL (IV SOLUTION) ×4 IMPLANT
PACK SURGICAL SETUP 50X90 (CUSTOM PROCEDURE TRAY) ×4 IMPLANT
PAD ARMBOARD 7.5X6 YLW CONV (MISCELLANEOUS) ×8 IMPLANT
PENCIL BUTTON HOLSTER BLD 10FT (ELECTRODE) ×4 IMPLANT
SHEARS HARMONIC 9CM CVD (BLADE) ×1 IMPLANT
SPONGE GAUZE 2X2 STER 10/PKG (GAUZE/BANDAGES/DRESSINGS) ×1
SPONGE INTESTINAL PEANUT (DISPOSABLE) IMPLANT
STRIP CLOSURE SKIN 1/2X4 (GAUZE/BANDAGES/DRESSINGS) ×4 IMPLANT
SUT MNCRL AB 4-0 PS2 18 (SUTURE) ×5 IMPLANT
SUT PROLENE 4 0 RB 1 (SUTURE) ×3
SUT PROLENE 4-0 RB1 .5 CRCL 36 (SUTURE) IMPLANT
SUT SILK 2 0 (SUTURE)
SUT SILK 2-0 18XBRD TIE 12 (SUTURE) IMPLANT
SUT SILK 3 0 (SUTURE)
SUT SILK 3-0 18XBRD TIE 12 (SUTURE) IMPLANT
SUT VIC AB 3-0 SH 18 (SUTURE) ×4 IMPLANT
SUT VIC AB 3-0 SH 8-18 (SUTURE) ×2 IMPLANT
SYR CONTROL 10ML LL (SYRINGE) IMPLANT
TOWEL GREEN STERILE (TOWEL DISPOSABLE) ×4 IMPLANT
TOWEL GREEN STERILE FF (TOWEL DISPOSABLE) ×4 IMPLANT
TUBE CONNECTING 12X1/4 (SUCTIONS) ×4 IMPLANT

## 2022-01-25 NOTE — Op Note (Signed)
Operative Note  Pre-operative Diagnosis:  secondary hyperparathyroidism of renal origin  Post-operative Diagnosis:  same  Surgeon:  Armandina Gemma, MD  Assistant:  none   Procedure:  Total parathyroidectomy with autotransplantation to right brachioradialis muscle  Anesthesia:  general  Estimated Blood Loss:  20 cc  Drains: none         Specimen: 4 parathyroid glands to pathology  Indications:  Patient is referred by Dr. [name] and by Dr. Pearson Grippe for surgical evaluation and management of secondary hyperparathyroidism of renal origin. Patient initiated hemodialysis in January 2020. She dialyzes on Mondays Wednesdays and Fridays at the [facility]. She is using an access in the upper left arm. Patient had been evaluated in my practice in June 2019. At that time we had obtained an ultrasound examination of the neck as well as a nuclear medicine parathyroid scan. Neither of these studies localized an adenoma. Patient does have known thyroid nodules. Patient now has laboratory evidence consistent with secondary hyperparathyroidism. Calcium level has ranged from 9.2-11.2 within the last 2 months. Likewise her intact PTH level has ranged from 1765 to as high as 2112. Phosphorus level is elevated at 5.4. Patient complains of fatigue. She complains of neck pain. She complains of bone and joint pain. She presents today accompanied by her daughter for surgical evaluation.  Procedure:  The patient was seen in the pre-op holding area. The risks, benefits, complications, treatment options, and expected outcomes were previously discussed with the patient. The patient agreed with the proposed plan and has signed the informed consent form.  The patient was brought to the operating room by the surgical team, identified as Lezlie Lye and the procedure verified. A "time out" was completed and the above information confirmed.  Following administration of general anesthesia, the patient is  positioned and then prepped and draped in usual aseptic fashion.  After ascertaining that an adequate level of anesthesia been achieved, a Kocher incision is made with a #15 blade.  Dissection is carried through subcutaneous tissues and platysma.  Hemostasis is achieved with the electrocautery.  Skin flaps are elevated cephalad and caudad from the thyroid notch to the sternal notch.  A Mahorner self-retaining retractors placed for exposure.  Strap muscles are incised in the midline.  Dissection is begun on the left side.  Left thyroid lobe is mobilized.  It appears normal.  There is a relatively firm centrally located posterior nodule which was identified on previous ultrasound examination.  Additional dissection posterior to the left lobe reveals a markedly enlarged parathyroid gland posterior to the superior pole.  This is gently dissected out using the harmonic scalpel.  Vascular pedicles divided between ligaclips with the harmonic scalpel.  The gland is excised.  A biopsy is taken and submitted to pathology for frozen section labeled as left superior parathyroid gland.  Dr. Claudette Laws reviewed this and notes hypercellular parathyroid tissue.  The remainder of the gland is placed in ice saline on the back table.  Further dissection on the left side reveals a markedly enlarged parathyroid gland in an ectopic location in the left thyroid thymic tract.  This is gently dissected out beneath the left clavicle and mobilized.  The parathyroid gland is delivered into the anterior neck.  Vascular structures are divided between ligaclips with the harmonic scalpel and the gland is excised.  Again a biopsy is taken and sent to pathology labeled as left inferior parathyroid.  Dr. Saralyn Pilar confirms hypercellular parathyroid tissue.  The remainder the  gland is placed in ice saline on the back table.  Next we turned our attention to the right side of the neck.  Right thyroid lobe appears normal.  It is gently mobilized  and rolled anteriorly.  Posterior to the midportion of the thyroid lobe are identified 2 parathyroid glands both of which are enlarged.  The superior gland is dissected out.  Vascular structures are divided between medium ligaclips and the gland is excised.  Frozen section biopsy confirms hypercellular parathyroid tissue.  Remainder the gland is placed in ice saline on the back table.  The inferior gland on the right side is also dissected out.  This is a small most of the 4 glands.  It is mobilized and the vascular pedicle divided between small ligaclips.  Gland is excised.  Frozen section biopsy confirms hypercellular parathyroid tissue.  Remainder the gland is placed in ice saline on the back table.  Neck is irrigated copiously with warm saline.  Good hemostasis is noted throughout the operative field.  Fibrillar is placed throughout the operative field.  Strap muscles are reapproximated in the midline of interrupted 3-0 Vicryl sutures.  Platysma was closed with interrupted 3-0 Vicryl sutures.  Skin is closed with a running 4-0 Monocryl subcuticular suture.  Wound is washed and dried and Dermabond is applied as dressing.  Next the sterile field is removed.  The right arm is placed on an armboard.  The right arm is then prepped and draped in the usual aseptic fashion.  After ascertaining that an adequate level of anesthesia been achieved, an incision is made over the right brachial radialis muscle.  Dissection is carried through subcutaneous tissues and skin flaps are developed circumferentially.  A self-retaining retractors placed for exposure.  On the back table the previously excised parathyroid glands are inspected and selected.  1 mm fragments are cut from the previous glands.  Eight 1 mm fragments are created and kept in cold saline for implantation.  In the right forearm the muscular fascia of the brachial radialis muscle is incised with a #15 blade.  A pocket is created in the muscle using a  mosquito hemostat.  A 1 mm fragment of parathyroid tissue was inserted into the muscular pocket and the overlying fascia closed with an interrupted 4-0 Prolene simple suture.  This exercises repeated for a total of 8 implants.   Subcutaneous tissues are closed with interrupted 3-0 Vicryl sutures.  Skin is closed with a running 4-0 Monocryl subcuticular suture.  Wound is washed and dried and Dermabond is applied as dressing.  Patient is awakened from anesthesia and transported to the recovery room.  Patient will be seen in consultation by nephrology.  Patient tolerated the procedure well.   Armandina Gemma, [place] Office: [phone]

## 2022-01-25 NOTE — Plan of Care (Signed)
  Problem: Health Behavior/Discharge Planning: Goal: Ability to manage health-related needs will improve Outcome: Progressing   

## 2022-01-25 NOTE — Interval H&P Note (Signed)
History and Physical Interval Note:  01/25/2022 8:19 AM  Mallory Chavez  has presented today for surgery, with the diagnosis of SECONDARY HYPERPARATHYROIDISM OF RENAL ORIGIN.  The various methods of treatment have been discussed with the patient and family. After consideration of risks, benefits and other options for treatment, the patient has consented to    Procedure(s): NECK EXPLORATION WITH PARATHYROIDECTOMY (N/A) AUTOTRANSPLANT TO RIGHT FOREARM (N/A) as a surgical intervention.    The patient's history has been reviewed, patient examined, no change in status, stable for surgery.  I have reviewed the patient's chart and labs.  Questions were answered to the patient's satisfaction.    Armandina Gemma, [facility] Office: [place]

## 2022-01-25 NOTE — Anesthesia Procedure Notes (Signed)
Procedure Name: Intubation Date/Time: 01/25/2022 8:56 AM  Performed by: Dorann Lodge, CRNAPre-anesthesia Checklist: Patient identified, Emergency Drugs available, Suction available and Patient being monitored Patient Re-evaluated:Patient Re-evaluated prior to induction Oxygen Delivery Method: Circle System Utilized Preoxygenation: Pre-oxygenation with 100% oxygen Induction Type: IV induction Ventilation: Mask ventilation without difficulty Laryngoscope Size: Mac and 3 Grade View: Grade I Tube type: Oral Tube size: 7.0 mm Number of attempts: 1 Airway Equipment and Method: Stylet and Oral airway Placement Confirmation: ETT inserted through vocal cords under direct vision, positive ETCO2 and breath sounds checked- equal and bilateral Secured at: 22 cm Tube secured with: Tape Dental Injury: Teeth and Oropharynx as per pre-operative assessment

## 2022-01-25 NOTE — Consult Note (Signed)
Renal Service Consult Note [facility]  Mallory Chavez 01/25/2022 [name], MD Requesting Physician: Dr. Harlow Asa  Reason for Consult: ESRD pt sp parathyroidectomy HPI: The patient is a 86 y.o. year-old w/ hx of ESRD on HD, COPD, chronic resp failure, asthma, HL, HTN who underwent total parathryoidectomy w/ R arm autotransplant today by Dr Harlow Asa.  Pt is admitted now and we are asked to see for ESRD and possibility of hungry bones syndrome.   Pt seen in room.  Has been on HD for about 4 yrs. Grew up in [place], now lives in [place], lives by herself. Does not drive. [place] [employer] bus to [place]. No tobacco or sig etoh. No CP or SOB.    ROS - denies CP, no joint pain, no HA, no blurry vision, no rash, no diarrhea, no nausea/ vomiting, no dysuria, no difficulty voiding   Past Medical History  Past Medical History:  Diagnosis Date   Acute renal failure (ARF) ([place]) 06/15/2016   Allergy    Arthritis    Asthma    CHF (congestive heart failure) ([place]) 1980   controlled with meds   Chronic respiratory failure (HCC)    CKD (chronic kidney disease)    COPD (chronic obstructive pulmonary disease) (HCC)    GERD (gastroesophageal reflux disease)    Hyperglycemia    Hyperlipidemia    Hypertension    Hyponatremia    Past Surgical History  Past Surgical History:  Procedure Laterality Date   AV FISTULA PLACEMENT Left 07/23/2018   Procedure: ARTERIOVENOUS (AV) FISTULA CREATION;  Surgeon: Waynetta Sandy, MD;  Location: [place];  Service: Vascular;  Laterality: Left;   [place] Right 07/23/2018   Procedure: INSERTION OF DIALYSIS CATHETER;  Surgeon: Waynetta Sandy, MD;  Location: [facility] OR;  Service: Vascular;  Laterality: Right;   TUMOR REMOVAL     lower back/benign and has come back   VAGINAL HYSTERECTOMY     Family History  Family History  Problem Relation Age of Onset   Diabetes Mother    Emphysema  Father    Social History  reports that she quit smoking about 29 years ago. Her smoking use included cigarettes. She has a 30.00 pack-year smoking history. She has never used smokeless tobacco. She reports that she does not drink alcohol and does not use drugs. Allergies No Known Allergies Home medications Prior to Admission medications   Medication Sig Start Date End Date Taking? Authorizing Provider  acetaminophen (TYLENOL) 500 MG tablet Take 500 mg by mouth daily as needed for mild pain or moderate pain.   Yes [provider]  albuterol (VENTOLIN HFA) 108 (90 Base) MCG/ACT inhaler Inhale 2 puffs by mouth into the lungs every 4 hours as needed for wheezing or shortness of breath 10/03/20  Yes Tanda Rockers, MD  aspirin EC 81 MG tablet Take 81 mg by mouth daily after supper.   Yes [provider]  B Complex-C-Zn-Folic Acid (DIALYVITE 202 WITH ZINC) 0.8 MG TABS Take 1 tablet by mouth daily. 05/03/20  Yes [provider]  budesonide-formoterol (SYMBICORT) 160-4.5 MCG/ACT inhaler Inhale 2 puffs into the lungs 2 (two) times daily. 10/20/21  Yes Tanda Rockers, MD  cyclobenzaprine (FLEXERIL) 5 MG tablet Take 1 tablet by mouth at bedtime 05/23/21  Yes [name], MD  famotidine (PEPCID) 40 MG tablet Take 1 tablet by mouth once daily 05/23/21  Yes [name], MD  gabapentin (NEURONTIN)  100 MG capsule Take 1 capsule (100 mg total) by mouth at bedtime. 08/25/21  Yes Edrick Kins, DPM  Glycerin-Hypromellose-PEG 400 (DRY EYE RELIEF DROPS OP) Place 1 drop into both eyes at bedtime.   Yes [provider]  lidocaine-prilocaine (EMLA) cream Apply  a small amount to skin as directed to AVF- prior to dialysis 12/22/21  Yes   loratadine (CLARITIN) 10 MG tablet Take 10 mg by mouth daily after supper.   Yes [provider]  OXYGEN Inhale 1 L into the lungs See admin instructions. with sleep and exertion as needed [facility]   Yes [provider]  sevelamer  carbonate (RENVELA) 800 MG tablet Take 1,600 mg by mouth 3 (three) times daily. 12/06/21  Yes [provider]  colchicine 0.6 MG tablet Take 1 tablet by mouth once daily as needed Patient taking differently: Take 0.6 mg by mouth daily as needed (Gout). 10/03/20   [name], MD  lidocaine-prilocaine (EMLA) cream Apply a small amount to skin as directed Apply to AVF- prior to dialysis Patient not taking: Reported on 01/16/2022 08/28/21        Vitals:   01/25/22 1245 01/25/22 1300 01/25/22 1330 01/25/22 1410  BP: (!) 115/53 (!) 103/56 (!) 109/59 (!) 113/59  Pulse: 73 77 75 75  Resp: '13 12 11 17  '$ Temp: 97.8 F (36.6 C)   97.7 F (36.5 C)  TempSrc:    Oral  SpO2: 99% 100% 100% 100%  Weight:      Height:       Exam Gen alert, no distress No rash, cyanosis or gangrene Sclera anicteric, throat clear , neck wound intact No jvd or bruits Chest clear bilat to bases, no rales/ wheezing RRR no MRG Abd soft ntnd no mass or ascites +bs GU defer MS no joint effusions or deformity Ext no LE or UE edema, no wounds or ulcers Neuro is alert, Ox 3 , nf    LUA AVF+bruit   Home meds include - albuterol, aspirin, budesnoide-formoterol, colchicine, famotidine, gabapentin, home O2 1L, sevelamer carbonate 2 ac tid, prns/ vits/ supps     OP HD: MWF [facility] 3h 30mn  400/1.5   64.8kg  2/2 bath  P2  Heparin 4000 LUA AVF - last HD 7/26, came off at 64.6kg - last Hb 11.1 on 7/26 - calcitriol 0.5 mcg tiw po w/ hd (on hold) - etelcalcetide 15 mg iv tiw w/ hd (on hold) - iron sucrose 50 mg weekly   Assessment/ Plan: SP total parathyroidectomy - w/ R forearm autotransplant, done today by Dr GHarlow Asa At risk of hungry bones syndrome.  Will start patient on liquid CaCO3 at 1 gm tid po (3 gm total per day) and will start calcitriol at 0.5 mcg qd, increase as needed. Get q 12 hr Ca levels. Use IV Ca++ for any symptomatic hypocalcemia and for CCa less than 7.0. Will follow.  ESRD - on HD MWF. Plan HD  tomorrow.  Volume- at dry wt , UF 1-2 L w/ HD tomorrow HTN - BP's are good, not taking any BP lowering meds. Follow.  COPD - cont home meds MBD ckd - will get RFP now, cont renvela 2 ac tid as binder.  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  HL - per pmd      RKelly Splinter MD 01/25/2022, 3:08 PM Recent Labs  Lab 01/25/22 0758 01/25/22 0843  HGB 12.2 12.6  CREATININE 6.20* 6.60*  K 5.7* 4.1

## 2022-01-25 NOTE — Anesthesia Postprocedure Evaluation (Signed)
Anesthesia Post Note  Patient: Mallory Chavez  Procedure(s) Performed: NECK EXPLORATION WITH PARATHYROIDECTOMY (Neck) AUTOTRANSPLANT TO RIGHT FOREARM (Right: Arm Lower)     Patient location during evaluation: PACU Anesthesia Type: General Level of consciousness: awake Pain management: pain level controlled Vital Signs Assessment: post-procedure vital signs reviewed and stable Respiratory status: spontaneous breathing Cardiovascular status: stable Postop Assessment: no apparent nausea or vomiting Anesthetic complications: no   No notable events documented.  Last Vitals:  Vitals:   01/25/22 1410 01/25/22 1549  BP: (!) 113/59 127/61  Pulse: 75 75  Resp: 17 17  Temp: 36.5 C 36.9 C  SpO2: 100% 92%    Last Pain:  Vitals:   01/25/22 1625  TempSrc:   PainSc: 3                  Cosandra Plouffe

## 2022-01-25 NOTE — Transfer of Care (Signed)
Immediate Anesthesia Transfer of Care Note  Patient: Mallory Chavez  Procedure(s) Performed: NECK EXPLORATION WITH PARATHYROIDECTOMY (Neck) AUTOTRANSPLANT TO RIGHT FOREARM (Right: Arm Lower)  Patient Location: PACU  Anesthesia Type:General  Level of Consciousness: awake and alert   Airway & Oxygen Therapy: Patient Spontanous Breathing and Patient connected to nasal cannula oxygen  Post-op Assessment: Report given to RN and Post -op Vital signs reviewed and stable  Post vital signs: Reviewed and stable  Last Vitals:  Vitals Value Taken Time  BP 140/68 01/25/22 1130  Temp    Pulse 80 01/25/22 1132  Resp 18 01/25/22 1132  SpO2 95 % 01/25/22 1132  Vitals shown include unvalidated device data.  Last Pain:  Vitals:   01/25/22 0806  PainSc: 0-No pain         Complications: No notable events documented.

## 2022-01-26 ENCOUNTER — Encounter (HOSPITAL_COMMUNITY): Payer: Self-pay | Admitting: Surgery

## 2022-01-26 LAB — RENAL FUNCTION PANEL
Albumin: 3.6 g/dL (ref 3.5–5.0)
Albumin: 3.7 g/dL (ref 3.5–5.0)
Anion gap: 14 (ref 5–15)
Anion gap: 14 (ref 5–15)
BUN: 42 mg/dL — ABNORMAL HIGH (ref 8–23)
BUN: 36 mg/dL — ABNORMAL HIGH (ref 8–23)
CO2: 28 mmol/L (ref 22–32)
CO2: 27 mmol/L (ref 22–32)
Calcium: 7.1 mg/dL — ABNORMAL LOW (ref 8.9–10.3)
Calcium: 6.9 mg/dL — ABNORMAL LOW (ref 8.9–10.3)
Chloride: 93 mmol/L — ABNORMAL LOW (ref 98–111)
Chloride: 90 mmol/L — ABNORMAL LOW (ref 98–111)
Creatinine, Ser: 7.9 mg/dL — ABNORMAL HIGH (ref 0.44–1.00)
Creatinine, Ser: 6.45 mg/dL — ABNORMAL HIGH (ref 0.44–1.00)
GFR, Estimated: 5 mL/min — ABNORMAL LOW (ref >60)
GFR, Estimated: 6 mL/min — ABNORMAL LOW (ref >60)
Glucose, Bld: 102 mg/dL — ABNORMAL HIGH (ref 70–99)
Glucose, Bld: 137 mg/dL — ABNORMAL HIGH (ref 70–99)
Phosphorus: 3.7 mg/dL (ref 2.5–4.6)
Phosphorus: 2.6 mg/dL (ref 2.5–4.6)
Potassium: 5 mmol/L (ref 3.5–5.1)
Potassium: 4.6 mmol/L (ref 3.5–5.1)
Sodium: 135 mmol/L (ref 135–145)
Sodium: 131 mmol/L — ABNORMAL LOW (ref 135–145)

## 2022-01-26 LAB — HEPATITIS B SURFACE ANTIBODY, QUANTITATIVE: Hep B S AB Quant (Post): >1000 m[IU]/mL (ref >9.9)

## 2022-01-26 LAB — CALCIUM: Calcium: 6.9 mg/dL — ABNORMAL LOW (ref 8.9–10.3)

## 2022-01-26 MED ORDER — CALCIUM CARBONATE ANTACID 1250 MG/5ML PO SUSP
2000.0000 mg | Freq: Three times a day (TID) | ORAL | Status: DC
  Administered 2022-01-26 – 2022-01-27 (×3): 2000 mg via ORAL
  Filled 2022-01-26 (×4): qty 10

## 2022-01-26 MED ORDER — CALCIUM GLUCONATE-NACL 2-0.675 GM/100ML-% IV SOLN
2.0000 g | Freq: Once | INTRAVENOUS | Status: DC
Start: 2022-01-26 — End: 2022-01-26
  Filled 2022-01-26: qty 100

## 2022-01-26 MED ORDER — CALCITRIOL 0.5 MCG PO CAPS
1.0000 ug | ORAL_CAPSULE | Freq: Every day | ORAL | Status: DC
  Administered 2022-01-26 – 2022-01-27 (×2): 1 ug via ORAL
  Filled 2022-01-26 (×3): qty 2

## 2022-01-26 MED ORDER — CALCIUM GLUCONATE-NACL 2-0.675 GM/100ML-% IV SOLN
2.0000 g | INTRAVENOUS | Status: AC
  Administered 2022-01-26: 2000 mg via INTRAVENOUS
  Filled 2022-01-26: qty 100

## 2022-01-26 NOTE — Care Management Obs Status (Signed)
[place] NOTIFICATION   Patient Details  Name: Mallory Chavez MRN: [id number] Date of Birth: May 01, 1936   Medicare Observation Status Notification Given:  Yes  Verbal consent to sign due to remote   Verdell Carmine, RN 01/26/2022, 3:52 PM

## 2022-01-26 NOTE — Progress Notes (Signed)
Pt receives out-pt HD at [facility] on MWF. Pt arrives at 10:30 for 10:50 chair time. Will assist as needed.   Melven Sartorius Renal Navigator [phone]

## 2022-01-26 NOTE — Care Management Obs Status (Signed)
[place] NOTIFICATION   Patient Details  Name: Mallory Chavez MRN: [id number] Date of Birth: 11/08/1935   Medicare Observation Status Notification Given:  Yes    Verdell Carmine, RN 01/26/2022, 3:54 PM

## 2022-01-26 NOTE — Progress Notes (Signed)
Received patient in bed, alert and oriented. Informed consent signed and in chart.  Tx duration: 1427  HD treatment completed. Patient tolerated well. LUA Fistula without signs and symptoms of complications. Patient transported back to the room, alert and orient and in no acute distress. Report given to bedside RN.  Total UF removed: 1.1m   Medication given: Calcitriol   Post HD VS: 124/57, 67, 11, 99, 98.1   Post HD weight: 61.7 kg

## 2022-01-26 NOTE — Care Management CC44 (Signed)
Condition Code 44 Documentation Completed  Patient Details  Name: Mallory Chavez MRN: [id number] Date of Birth: November 30, 1935   Condition Code 44 given:  Yes Patient signature on Condition Code 44 notice:  Yes Documentation of 2 MD's agreement:  Yes Code 44 added to claim:  Yes  Verbal permission to sign via phone due to remote   Verdell Carmine, RN 01/26/2022, 3:52 PM

## 2022-01-26 NOTE — Progress Notes (Signed)
[facility] Progress Note   Subjective:    Seen and examined patient at bedside. S/p total parathyroidectomy w R forearm autotransplant yesterday by Dr. Harlow Asa. Noted Ca trending down from 8.6 to now 7.1. She reports neck and R arm sorness which is expected. She denies SOB, CP, N/V, and muscle twitching. Titrating up Ca supplements aggressively. Plan for HD today.  Objective Vitals:   01/25/22 2044 01/26/22 0051 01/26/22 0449 01/26/22 0906  BP: (!) 91/52 (!) 110/55 (!) 109/50 (!) 100/49  Pulse: 81 77 78 76  Resp: '18  17 17  '$ Temp: 97.7 F (36.5 C) 98 F (36.7 C)  98 F (36.7 C)  TempSrc: Oral Oral  Oral  SpO2: 96% 97% 97% 93%  Weight:      Height:       Physical Exam General: Well-appearing; NAD Heart: Normal S1 and S2; No murmurs, gallops, or rubs Lungs: Diminished at bases and clear in uppers; No wheezing, rales, or rhonchi Abdomen: Soft and non-tender Extremities: No edema BLLE Dialysis Access: L AVF (+) B/T   Filed Weights   01/25/22 0649  Weight: 64.9 kg    Intake/Output Summary (Last 24 hours) at 01/26/2022 0958 Last data filed at 01/25/2022 1600 Gross per 24 hour  Intake 880 ml  Output 25 ml  Net 855 ml    Additional Objective Labs: Basic Metabolic Panel: Recent Labs  Lab 01/25/22 0843 01/25/22 1602 01/26/22 0423  NA 136 138 135  K 4.1 5.1 5.0  CL 97* 92* 93*  CO2  --  28 28  GLUCOSE 95 133* 102*  BUN 30* 33* 42*  CREATININE 6.60* 6.72* 7.90*  CALCIUM  --  8.6* 7.1*  PHOS  --  4.5 3.7   Liver Function Tests: Recent Labs  Lab 01/25/22 1602 01/26/22 0423  ALBUMIN 4.0 3.6   No results for input(s): "LIPASE", "AMYLASE" in the last 168 hours. CBC: Recent Labs  Lab 01/25/22 0758 01/25/22 0843  HGB 12.2 12.6  HCT 36.0 37.0   Blood Culture No results found for: "SDES", "SPECREQUEST", "CULT", "REPTSTATUS"  Cardiac Enzymes: No results for input(s): "CKTOTAL", "CKMB", "CKMBINDEX", "TROPONINI" in the last 168 hours. CBG: No  results for input(s): "GLUCAP" in the last 168 hours. Iron Studies: No results for input(s): "IRON", "TIBC", "TRANSFERRIN", "FERRITIN" in the last 72 hours. Lab Results  Component Value Date   INR 1.07 07/23/2018   INR 1.13 07/19/2018   INR 1.05 07/03/2016   Studies/Results: No results found.  Medications:  sodium chloride      calcitRIOL  1 mcg Oral Daily   calcium carbonate (dosed in mg elemental calcium)  2,000 mg of elemental calcium Oral TID   Chlorhexidine Gluconate Cloth  6 each Topical Q0600   gabapentin  100 mg Oral QHS   mometasone-formoterol  2 puff Inhalation BID   polyvinyl alcohol  1 drop Both Eyes QHS   sevelamer carbonate  1,600 mg Oral TID WC    Dialysis Orders: MWF [facility] 3h 22mn  400/1.5   64.8kg  2/2 bath  P2  Heparin 4000 LUA AVF - last HD 7/26, came off at 64.6kg - last Hb 11.1 on 7/26 - calcitriol 0.5 mcg tiw po w/ hd (on hold) - etelcalcetide 15 mg iv tiw w/ hd (on hold) - iron sucrose 50 mg weekly  Assessment/Plan: SP total parathyroidectomy - w/ R forearm autotransplant, done yesterday by Dr GHarlow Asa At risk of hungry bones syndrome.  Titrated up liquid CaCO3 to 2 gm tid po (  6 gm total per day) and calcitriol to 1 mcg qd, continue to titrate as needed. Ordered q 4 hr Ca levels. Will use IV Ca++ for any symptomatic hypocalcemia and for CCa less than 7.0. 2.5Ca bag is the highest concentration available with HD treatments. Will follow closely.  ESRD - on HD MWF. Plan HD today.  Volume- at dry wt , UF 1-2 L w/ HD today HTN - BP's are good, not taking any BP lowering meds. Follow.  COPD - cont home meds MBD ckd - will get RFP now, PO4 is trending down so will hold binders for now. Will transition to Calcium Acetate if indicated.  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  HL - per pmd  Tobie Poet, NP [facility] 01/26/2022,9:58 AM  LOS: 1 day

## 2022-01-27 LAB — CALCIUM
Calcium: 7.4 mg/dL — ABNORMAL LOW (ref 8.9–10.3)
Calcium: 7.5 mg/dL — ABNORMAL LOW (ref 8.9–10.3)
Calcium: 7.1 mg/dL — ABNORMAL LOW (ref 8.9–10.3)
Calcium: 7 mg/dL — ABNORMAL LOW (ref 8.9–10.3)
Calcium: 6.9 mg/dL — ABNORMAL LOW (ref 8.9–10.3)
Calcium: 8 mg/dL — ABNORMAL LOW (ref 8.9–10.3)

## 2022-01-27 MED ORDER — CALCIUM GLUCONATE 10 % IV SOLN
4.0000 g | Freq: Once | INTRAVENOUS | Status: AC
Start: 2022-01-27 — End: 2022-01-27
  Administered 2022-01-27: 4 g via INTRAVENOUS
  Filled 2022-01-27: qty 40

## 2022-01-27 MED ORDER — CALCIUM CARBONATE ANTACID 1250 MG/5ML PO SUSP
3000.0000 mg | Freq: Three times a day (TID) | ORAL | Status: DC
  Filled 2022-01-27 (×2): qty 30

## 2022-01-27 MED ORDER — CALCITRIOL 0.5 MCG PO CAPS
1.5000 ug | ORAL_CAPSULE | Freq: Every day | ORAL | Status: DC
  Filled 2022-01-27: qty 3

## 2022-01-27 MED ORDER — CALCIUM CARBONATE ANTACID 1250 MG/5ML PO SUSP
3000.0000 mg | Freq: Three times a day (TID) | ORAL | Status: DC
  Administered 2022-01-27 (×2): 3000 mg via ORAL
  Filled 2022-01-27: qty 15
  Filled 2022-01-27: qty 30
  Filled 2022-01-27: qty 15

## 2022-01-27 NOTE — Progress Notes (Signed)
[facility] Progress Note   Subjective:    Seen and examined patient at bedside. She reports her neck soreness is improving. She tolerated yesterday's HD with net UF 1.9L. 2GM IV Calcium gluconate given yesterday evening for Ca 6.9. Noted Ca 7.5 today. She remains asymptomatic. Obtaining another Ca level now so will review result.   Objective Vitals:   01/26/22 2015 01/27/22 0446 01/27/22 0738 01/27/22 0841  BP: (!) 112/56 138/80  (!) 103/49  Pulse: 83 89 89 82  Resp: '17 17 17 18  '$ Temp: 98.8 F (37.1 C) 98.8 F (37.1 C)  98.7 F (37.1 C)  TempSrc: Oral Oral  Oral  SpO2: 90% 90% 90% 93%  Weight:      Height:       Physical Exam General: Well-appearing; NAD Heart: Normal S1 and S2; No murmurs, gallops, or rubs Lungs: Diminished at bases and clear in uppers; No wheezing, rales, or rhonchi Abdomen: Soft and non-tender Extremities: No edema BLLE Dialysis Access: L AVF (+) B/T     Filed Weights   01/25/22 0649 01/26/22 1011  Weight: 64.9 kg 65.5 kg    Intake/Output Summary (Last 24 hours) at 01/27/2022 1013 Last data filed at 01/26/2022 1425 Gross per 24 hour  Intake --  Output 1900 ml  Net -1900 ml    Additional Objective Labs: Basic Metabolic Panel: Recent Labs  Lab 01/25/22 1602 01/26/22 0423 01/26/22 1827 01/27/22 0052 01/27/22 0543  NA 138 135 131*  --   --   K 5.1 5.0 4.6  --   --   CL 92* 93* 90*  --   --   CO2 '28 28 27  '$ --   --   GLUCOSE 133* 102* 137*  --   --   BUN 33* 42* 36*  --   --   CREATININE 6.72* 7.90* 6.45*  --   --   CALCIUM 8.6* 7.1* 6.9*  6.9* 7.4* 7.5*  PHOS 4.5 3.7 2.6  --   --    Liver Function Tests: Recent Labs  Lab 01/25/22 1602 01/26/22 0423 01/26/22 1827  ALBUMIN 4.0 3.6 3.7   No results for input(s): "LIPASE", "AMYLASE" in the last 168 hours. CBC: Recent Labs  Lab 01/25/22 0758 01/25/22 0843  HGB 12.2 12.6  HCT 36.0 37.0   Blood Culture No results found for: "SDES", "SPECREQUEST", "CULT",  "REPTSTATUS"  Cardiac Enzymes: No results for input(s): "CKTOTAL", "CKMB", "CKMBINDEX", "TROPONINI" in the last 168 hours. CBG: No results for input(s): "GLUCAP" in the last 168 hours. Iron Studies: No results for input(s): "IRON", "TIBC", "TRANSFERRIN", "FERRITIN" in the last 72 hours. Lab Results  Component Value Date   INR 1.07 07/23/2018   INR 1.13 07/19/2018   INR 1.05 07/03/2016   Studies/Results: No results found.  Medications:  sodium chloride      [START ON 01/28/2022] calcitRIOL  1.5 mcg Oral Daily   calcium carbonate (dosed in mg elemental calcium)  2,000 mg of elemental calcium Oral TID   Chlorhexidine Gluconate Cloth  6 each Topical Q0600   gabapentin  100 mg Oral QHS   mometasone-formoterol  2 puff Inhalation BID   polyvinyl alcohol  1 drop Both Eyes QHS    Dialysis Orders: MWF [facility] 3h 88mn  400/1.5   64.8kg  2/2 bath  P2  Heparin 4000 LUA AVF - last HD 7/26, came off at 64.6kg - last Hb 11.1 on 7/26 - calcitriol 0.5 mcg tiw po w/ hd (on hold) - etelcalcetide 15  mg iv tiw w/ hd (on hold) - iron sucrose 50 mg weekly  Assessment/Plan: SP total parathyroidectomy - w/ R forearm autotransplant, done 7/27 by Dr Harlow Asa. At risk of hungry bones syndrome.  Titrated up liquid CaCO3 to 2 gm tid po (6 gm total per day) and calcitriol to 1.5 mcg qd, continue to titrate as needed. Patient remains asymptomatic. Ordered q 4 hr Ca levels. Will use IV Ca++ for any symptomatic hypocalcemia and for CCa less than 7.0. 2.5Ca bag is the highest concentration available with HD treatments. 2GM IV Calcium Gluconate given yesterday evening for Ca 6.9. Ca now 7.5. Obtaining another Ca level now. Depending on result will determine discharge-will d/w Dr. Jonnie Finner.  ESRD - on HD MWF. Next HD 7/31..  Volume- at dry wt , UF 1-2 L w/ HD today HTN - BP's are good, not taking any BP lowering meds. Follow.  COPD - cont home meds MBD ckd - PO4 is trending down so will hold binders for now. Will  transition to Calcium Acetate if indicated.  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  HL - per pmd   Tobie Poet, NP [facility] 01/27/2022,10:13 AM  LOS: 1 day

## 2022-01-27 NOTE — Progress Notes (Signed)
2 Days Post-Op   Subjective/Chief Complaint: Complains of soreness but feels better today. Ca 7.5 today   Objective: Vital signs in last 24 hours: Temp:  [97.7 F (36.5 C)-98.8 F (37.1 C)] 98.8 F (37.1 C) (07/29 0446) Pulse Rate:  [67-89] 89 (07/29 0738) Resp:  [9-21] 17 (07/29 0738) BP: (89-138)/(24-80) 138/80 (07/29 0446) SpO2:  [87 %-99 %] 90 % (07/29 0738) Weight:  [65.5 kg] 65.5 kg (07/28 1011) Last BM Date : 01/26/22  Intake/Output from previous day: 07/28 0701 - 07/29 0700 In: -  Out: 1900  Intake/Output this shift: No intake/output data recorded.  General appearance: alert and cooperative Neck: neck soft and incision looks good Resp: clear to auscultation bilaterally Cardio: regular rate and rhythm GI: soft, non-tender; bowel sounds normal; no masses,  no organomegaly  Lab Results:  Recent Labs    01/25/22 0758 01/25/22 0843  HGB 12.2 12.6  HCT 36.0 37.0   BMET Recent Labs    01/26/22 0423 01/26/22 1827 01/27/22 0052 01/27/22 0543  NA 135 131*  --   --   K 5.0 4.6  --   --   CL 93* 90*  --   --   CO2 28 27  --   --   GLUCOSE 102* 137*  --   --   BUN 42* 36*  --   --   CREATININE 7.90* 6.45*  --   --   CALCIUM 7.1* 6.9*  6.9* 7.4* 7.5*   PT/INR No results for input(s): "LABPROT", "INR" in the last 72 hours. ABG No results for input(s): "PHART", "HCO3" in the last 72 hours.  Invalid input(s): "PCO2", "PO2"  Studies/Results: No results found.  Anti-infectives: Anti-infectives (From admission, onward)    Start     Dose/Rate Route Frequency Ordered Stop   01/25/22 0730  ceFAZolin (ANCEF) IVPB 2g/100 mL premix        2 g 200 mL/hr over 30 Minutes Intravenous On call to O.R. 01/25/22 0727 01/25/22 0902       Assessment/Plan: s/p Procedure(s): NECK EXPLORATION WITH PARATHYROIDECTOMY (N/A) AUTOTRANSPLANT TO RIGHT FOREARM (Right) Advance diet Follow calcium Plan for d/c when ok with nephrology  LOS: 1 day    Autumn Messing  III 01/27/2022

## 2022-01-27 NOTE — Plan of Care (Signed)

## 2022-01-28 DIAGNOSIS — R110 Nausea: Secondary | ICD-10-CM | POA: Diagnosis not present

## 2022-01-28 DIAGNOSIS — Z7982 Long term (current) use of aspirin: Secondary | ICD-10-CM | POA: Diagnosis not present

## 2022-01-28 DIAGNOSIS — Z79899 Other long term (current) drug therapy: Secondary | ICD-10-CM | POA: Diagnosis not present

## 2022-01-28 DIAGNOSIS — M1990 Unspecified osteoarthritis, unspecified site: Secondary | ICD-10-CM | POA: Diagnosis present

## 2022-01-28 DIAGNOSIS — N2581 Secondary hyperparathyroidism of renal origin: Secondary | ICD-10-CM | POA: Diagnosis present

## 2022-01-28 DIAGNOSIS — I120 Hypertensive chronic kidney disease with stage 5 chronic kidney disease or end stage renal disease: Secondary | ICD-10-CM | POA: Diagnosis present

## 2022-01-28 DIAGNOSIS — R42 Dizziness and giddiness: Secondary | ICD-10-CM | POA: Diagnosis not present

## 2022-01-28 DIAGNOSIS — Z7951 Long term (current) use of inhaled steroids: Secondary | ICD-10-CM | POA: Diagnosis not present

## 2022-01-28 DIAGNOSIS — D631 Anemia in chronic kidney disease: Secondary | ICD-10-CM | POA: Diagnosis present

## 2022-01-28 DIAGNOSIS — E042 Nontoxic multinodular goiter: Secondary | ICD-10-CM | POA: Diagnosis present

## 2022-01-28 DIAGNOSIS — E785 Hyperlipidemia, unspecified: Secondary | ICD-10-CM | POA: Diagnosis present

## 2022-01-28 DIAGNOSIS — N186 End stage renal disease: Secondary | ICD-10-CM | POA: Diagnosis present

## 2022-01-28 DIAGNOSIS — Z87891 Personal history of nicotine dependence: Secondary | ICD-10-CM | POA: Diagnosis not present

## 2022-01-28 DIAGNOSIS — Z90710 Acquired absence of both cervix and uterus: Secondary | ICD-10-CM | POA: Diagnosis not present

## 2022-01-28 DIAGNOSIS — M898X9 Other specified disorders of bone, unspecified site: Secondary | ICD-10-CM | POA: Diagnosis present

## 2022-01-28 DIAGNOSIS — J449 Chronic obstructive pulmonary disease, unspecified: Secondary | ICD-10-CM | POA: Diagnosis present

## 2022-01-28 LAB — CALCIUM
Calcium: 7.6 mg/dL — ABNORMAL LOW (ref 8.9–10.3)
Calcium: 7.1 mg/dL — ABNORMAL LOW (ref 8.9–10.3)
Calcium: 8.2 mg/dL — ABNORMAL LOW (ref 8.9–10.3)
Calcium: 7.5 mg/dL — ABNORMAL LOW (ref 8.9–10.3)
Calcium: 7.7 mg/dL — ABNORMAL LOW (ref 8.9–10.3)
Calcium: 8.9 mg/dL (ref 8.9–10.3)

## 2022-01-28 MED ORDER — CALCIUM CARBONATE ANTACID 1250 MG/5ML PO SUSP
3000.0000 mg | Freq: Four times a day (QID) | ORAL | Status: DC
  Filled 2022-01-28: qty 15
  Filled 2022-01-28 (×2): qty 30

## 2022-01-28 MED ORDER — CALCIUM GLUCONATE-NACL 2-0.675 GM/100ML-% IV SOLN
2.0000 g | Freq: Four times a day (QID) | INTRAVENOUS | Status: DC
Start: 2022-01-28 — End: 2022-01-29
  Administered 2022-01-28 – 2022-01-29 (×4): 2000 mg via INTRAVENOUS
  Filled 2022-01-28 (×5): qty 100

## 2022-01-28 MED ORDER — LIDOCAINE-PRILOCAINE 2.5-2.5 % EX CREA
1.0000 | TOPICAL_CREAM | CUTANEOUS | Status: DC | PRN
  Filled 2022-01-28: qty 5

## 2022-01-28 MED ORDER — CALCIUM CARBONATE ANTACID 1250 MG/5ML PO SUSP
3000.0000 mg | Freq: Four times a day (QID) | ORAL | Status: DC
  Administered 2022-01-28: 3000 mg via ORAL
  Filled 2022-01-28 (×4): qty 30

## 2022-01-28 MED ORDER — HEPARIN SODIUM (PORCINE) 1000 UNIT/ML DIALYSIS: 1000.0000 [IU] | INTRAMUSCULAR | Status: DC | PRN

## 2022-01-28 MED ORDER — ALTEPLASE 2 MG IJ SOLR: 2.0000 mg | Freq: Once | INTRAMUSCULAR | Status: DC | PRN

## 2022-01-28 MED ORDER — CALCITRIOL 0.5 MCG PO CAPS
1.5000 ug | ORAL_CAPSULE | Freq: Two times a day (BID) | ORAL | Status: DC
  Administered 2022-01-28 (×2): 1.5 ug via ORAL
  Filled 2022-01-28 (×3): qty 3

## 2022-01-28 MED ORDER — LIDOCAINE HCL (PF) 1 % IJ SOLN: 5.0000 mL | INTRAMUSCULAR | Status: DC | PRN

## 2022-01-28 MED ORDER — PENTAFLUOROPROP-TETRAFLUOROETH EX AERO: 1.0000 | INHALATION_SPRAY | CUTANEOUS | Status: DC | PRN

## 2022-01-28 MED ORDER — CALCIUM CARBONATE ANTACID 1250 MG/5ML PO SUSP
3000.0000 mg | Freq: Four times a day (QID) | ORAL | Status: DC
  Administered 2022-01-28 (×3): 3000 mg via ORAL
  Filled 2022-01-28: qty 30
  Filled 2022-01-28 (×2): qty 15

## 2022-01-28 NOTE — Progress Notes (Signed)
[facility] Progress Note   Subjective:    Seen and examined patient at bedside. She reports some numbness in hands and feet. She says the numbness in feet has improved since the compression wraps have been applied. Otherwise, no other issues. She remains on RA and appears comfortable. 4GM IV Calcium Gluconate given yesterday for Ca 6.9. Ca levels are fluctuating. Continuing to titrate up supplements. Plan for HD 7/31.  Objective Vitals:   01/27/22 2101 01/28/22 0425 01/28/22 0803 01/28/22 0900  BP:  101/60  (!) 91/53  Pulse:  85 82 84  Resp:  '17 18 17  '$ Temp:  98.2 F (36.8 C)  98.3 F (36.8 C)  TempSrc:  Oral  Oral  SpO2: 95% 97%  96%  Weight:      Height:       Physical Exam General: Well-appearing; on RA; NAD Heart: Normal S1 and S2; No murmurs, gallops, or rubs Lungs: Diminished at bases and clear in uppers; No wheezing, rales, or rhonchi Abdomen: Soft and non-tender Extremities: No edema BLLE Dialysis Access: L AVF (+) B/T    Filed Weights   01/25/22 0649 01/26/22 1011  Weight: 64.9 kg 65.5 kg   No intake or output data in the 24 hours ending 01/28/22 0937  Additional Objective Labs: Basic Metabolic Panel: Recent Labs  Lab 01/25/22 1602 01/26/22 0423 01/26/22 1827 01/27/22 0052 01/27/22 2118 01/28/22 0123 01/28/22 0615  NA 138 135 131*  --   --   --   --   K 5.1 5.0 4.6  --   --   --   --   CL 92* 93* 90*  --   --   --   --   CO2 '28 28 27  '$ --   --   --   --   GLUCOSE 133* 102* 137*  --   --   --   --   BUN 33* 42* 36*  --   --   --   --   CREATININE 6.72* 7.90* 6.45*  --   --   --   --   CALCIUM 8.6* 7.1* 6.9*  6.9*   < > 8.0* 7.6* 7.1*  PHOS 4.5 3.7 2.6  --   --   --   --    < > = values in this interval not displayed.   Liver Function Tests: Recent Labs  Lab 01/25/22 1602 01/26/22 0423 01/26/22 1827  ALBUMIN 4.0 3.6 3.7   No results for input(s): "LIPASE", "AMYLASE" in the last 168 hours. CBC: Recent Labs  Lab 01/25/22 0758  01/25/22 0843  HGB 12.2 12.6  HCT 36.0 37.0   Blood Culture No results found for: "SDES", "SPECREQUEST", "CULT", "REPTSTATUS"  Cardiac Enzymes: No results for input(s): "CKTOTAL", "CKMB", "CKMBINDEX", "TROPONINI" in the last 168 hours. CBG: No results for input(s): "GLUCAP" in the last 168 hours. Iron Studies: No results for input(s): "IRON", "TIBC", "TRANSFERRIN", "FERRITIN" in the last 72 hours. Lab Results  Component Value Date   INR 1.07 07/23/2018   INR 1.13 07/19/2018   INR 1.05 07/03/2016   Studies/Results: No results found.  Medications:  sodium chloride     calcium gluconate      calcitRIOL  1.5 mcg Oral BID   calcium carbonate (dosed in mg elemental calcium)  3,000 mg of elemental calcium Oral QID   Chlorhexidine Gluconate Cloth  6 each Topical Q0600   gabapentin  100 mg Oral QHS   mometasone-formoterol  2 puff Inhalation  BID   polyvinyl alcohol  1 drop Both Eyes QHS    Dialysis Orders: MWF [facility] 3h 39mn  400/1.5   64.8kg  2/2 bath  P2  Heparin 4000 LUA AVF - last HD 7/26, came off at 64.6kg - last Hb 11.1 on 7/26 - calcitriol 0.5 mcg tiw po w/ hd (on hold) - etelcalcetide 15 mg iv tiw w/ hd (on hold) - iron sucrose 50 mg weekly  Assessment/Plan: SP total parathyroidectomy - w/ R forearm autotransplant, done 7/27 by Dr GHarlow Asa At risk of hungry bones syndrome. Liquid CaCO3 now up to 3 gm QID po (12 gm total per day) and calcitriol to 1.5 mcg BID, continue to titrate as needed. Patient remains asymptomatic. Continue q 4 hr Ca levels.  2.5Ca bag is the highest concentration available with HD treatments. 4GM IV Calcium Gluconate given yesterday evening for Ca 6.9. She responded but now trending back down. Ordered 2GM IV Calcium Gluconate Q6hrs for now. Discussed with Dr. STheophilus Kindslevels to stabilize in 3-5days. Discussed this with patient and she verbalized understanding. ESRD - on HD MWF. Next HD 7/31. Use the highest Ca bath we have available. Volume- at  dry wt , UF 1-2 L w/ HD today HTN - BP's soft but stable-currently asymptomatic, not taking any BP lowering meds. Follow.  COPD - cont home meds MBD ckd - PO4 is trending down so will hold binders for now. Will transition to Calcium Acetate if indicated.  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  HL - per pmd  CTobie Poet NP [facility] 01/28/2022,9:37 AM  LOS: 1 day

## 2022-01-28 NOTE — Plan of Care (Signed)

## 2022-01-28 NOTE — Plan of Care (Signed)
  Problem: Education: Goal: Knowledge of General Education information will improve Description: Including pain rating scale, medication(s)/side effects and non-pharmacologic comfort measures 01/28/2022 0131 by Jule Ser, RN Outcome: Progressing 01/28/2022 0122 by Jule Ser, RN Outcome: Progressing   Problem: Health Behavior/Discharge Planning: Goal: Ability to manage health-related needs will improve 01/28/2022 0131 by Jule Ser, RN Outcome: Progressing 01/28/2022 0122 by Jule Ser, RN Outcome: Progressing   Problem: Clinical Measurements: Goal: Ability to maintain clinical measurements within normal limits will improve 01/28/2022 0131 by Jule Ser, RN Outcome: Progressing 01/28/2022 0122 by Jule Ser, RN Outcome: Progressing Goal: Will remain free from infection 01/28/2022 0131 by Jule Ser, RN Outcome: Progressing 01/28/2022 0122 by Jule Ser, RN Outcome: Progressing Goal: Diagnostic test results will improve 01/28/2022 0131 by Jule Ser, RN Outcome: Progressing 01/28/2022 0122 by Jule Ser, RN Outcome: Progressing Goal: Respiratory complications will improve 01/28/2022 0131 by Jule Ser, RN Outcome: Progressing 01/28/2022 0122 by Jule Ser, RN Outcome: Progressing Goal: Cardiovascular complication will be avoided 01/28/2022 0131 by Jule Ser, RN Outcome: Progressing 01/28/2022 0122 by Jule Ser, RN Outcome: Progressing   Problem: Activity: Goal: Risk for activity intolerance will decrease 01/28/2022 0131 by Jule Ser, RN Outcome: Progressing 01/28/2022 0122 by Jule Ser, RN Outcome: Progressing   Problem: Nutrition: Goal: Adequate nutrition will be maintained 01/28/2022 0131 by Jule Ser, RN Outcome: Progressing 01/28/2022 0122 by Jule Ser, RN Outcome: Progressing   Problem: Coping: Goal: Level of anxiety will decrease 01/28/2022 0131 by Jule Ser, RN Outcome: Progressing 01/28/2022 0122 by Jule Ser, RN Outcome: Progressing   Problem: Elimination: Goal: Will not experience complications related to bowel motility 01/28/2022 0131 by Jule Ser, RN Outcome: Progressing 01/28/2022 0122 by Jule Ser, RN Outcome: Progressing Goal: Will not experience complications related to urinary retention 01/28/2022 0131 by Jule Ser, RN Outcome: Progressing 01/28/2022 0122 by Jule Ser, RN Outcome: Progressing   Problem: Pain Managment: Goal: General experience of comfort will improve 01/28/2022 0131 by Jule Ser, RN Outcome: Progressing 01/28/2022 0122 by Jule Ser, RN Outcome: Progressing   Problem: Safety: Goal: Ability to remain free from injury will improve 01/28/2022 0131 by Jule Ser, RN Outcome: Progressing 01/28/2022 0122 by Jule Ser, RN Outcome: Progressing   Problem: Skin Integrity: Goal: Risk for impaired skin integrity will decrease 01/28/2022 0131 by Jule Ser, RN Outcome: Progressing 01/28/2022 0122 by Jule Ser, RN Outcome: Progressing

## 2022-01-28 NOTE — Progress Notes (Signed)
3 Days Post-Op   Subjective/Chief Complaint: Complains of some numbness in hands. Tolerating diet. Ca levels have been up and down   Objective: Vital signs in last 24 hours: Temp:  [98.2 F (36.8 C)-99.2 F (37.3 C)] 98.3 F (36.8 C) (07/30 0900) Pulse Rate:  [54-85] 84 (07/30 0900) Resp:  [16-18] 17 (07/30 0900) BP: (91-101)/(50-60) 91/53 (07/30 0900) SpO2:  [95 %-97 %] 96 % (07/30 0900) Last BM Date : 01/26/22  Intake/Output from previous day: No intake/output data recorded. Intake/Output this shift: No intake/output data recorded.  General appearance: alert and cooperative Resp: clear to auscultation bilaterally Cardio: regular rate and rhythm GI: soft, nontender  Lab Results:  No results for input(s): "WBC", "HGB", "HCT", "PLT" in the last 72 hours. BMET Recent Labs    01/26/22 0423 01/26/22 1827 01/27/22 0052 01/28/22 0123 01/28/22 0615  NA 135 131*  --   --   --   K 5.0 4.6  --   --   --   CL 93* 90*  --   --   --   CO2 28 27  --   --   --   GLUCOSE 102* 137*  --   --   --   BUN 42* 36*  --   --   --   CREATININE 7.90* 6.45*  --   --   --   CALCIUM 7.1* 6.9*  6.9*   < > 7.6* 7.1*   < > = values in this interval not displayed.   PT/INR No results for input(s): "LABPROT", "INR" in the last 72 hours. ABG No results for input(s): "PHART", "HCO3" in the last 72 hours.  Invalid input(s): "PCO2", "PO2"  Studies/Results: No results found.  Anti-infectives: Anti-infectives (From admission, onward)    Start     Dose/Rate Route Frequency Ordered Stop   01/25/22 0730  ceFAZolin (ANCEF) IVPB 2g/100 mL premix        2 g 200 mL/hr over 30 Minutes Intravenous On call to O.R. 01/25/22 0727 01/25/22 0902       Assessment/Plan: s/p Procedure(s): NECK EXPLORATION WITH PARATHYROIDECTOMY (N/A) AUTOTRANSPLANT TO RIGHT FOREARM (Right) Advance diet Follow Ca levels Plan for d/c when stable from nephrology standpoint  LOS: 1 day    Autumn Messing  III 01/28/2022

## 2022-01-29 ENCOUNTER — Other Ambulatory Visit: Payer: Self-pay

## 2022-01-29 LAB — RENAL FUNCTION PANEL
Albumin: 3.2 g/dL — ABNORMAL LOW (ref 3.5–5.0)
Anion gap: 18 — ABNORMAL HIGH (ref 5–15)
BUN: 68 mg/dL — ABNORMAL HIGH (ref 8–23)
CO2: 30 mmol/L (ref 22–32)
Calcium: 10.6 mg/dL — ABNORMAL HIGH (ref 8.9–10.3)
Chloride: 82 mmol/L — ABNORMAL LOW (ref 98–111)
Creatinine, Ser: 11.11 mg/dL — ABNORMAL HIGH (ref 0.44–1.00)
GFR, Estimated: 3 mL/min — ABNORMAL LOW (ref >60)
Glucose, Bld: 103 mg/dL — ABNORMAL HIGH (ref 70–99)
Phosphorus: 3.6 mg/dL (ref 2.5–4.6)
Potassium: 5 mmol/L (ref 3.5–5.1)
Sodium: 130 mmol/L — ABNORMAL LOW (ref 135–145)

## 2022-01-29 LAB — CBC WITH DIFFERENTIAL/PLATELET
Abs Immature Granulocytes: 0.02 10*3/uL (ref 0.00–0.07)
Basophils Absolute: 0 10*3/uL (ref 0.0–0.1)
Basophils Relative: 0 %
Eosinophils Absolute: 0.1 10*3/uL (ref 0.0–0.5)
Eosinophils Relative: 2 %
HCT: 29.1 % — ABNORMAL LOW (ref 36.0–46.0)
Hemoglobin: 9.3 g/dL — ABNORMAL LOW (ref 12.0–15.0)
Immature Granulocytes: 0 %
Lymphocytes Relative: 18 %
Lymphs Abs: 1.1 10*3/uL (ref 0.7–4.0)
MCH: 33.2 pg (ref 26.0–34.0)
MCHC: 32 g/dL (ref 30.0–36.0)
MCV: 103.9 fL — ABNORMAL HIGH (ref 80.0–100.0)
Monocytes Absolute: 0.6 10*3/uL (ref 0.1–1.0)
Monocytes Relative: 10 %
Neutro Abs: 4.3 10*3/uL (ref 1.7–7.7)
Neutrophils Relative %: 70 %
Platelets: 154 10*3/uL (ref 150–400)
RBC: 2.8 MIL/uL — ABNORMAL LOW (ref 3.87–5.11)
RDW: 13.7 % (ref 11.5–15.5)
WBC: 6.2 10*3/uL (ref 4.0–10.5)
nRBC: 0 % (ref 0.0–0.2)

## 2022-01-29 LAB — CALCIUM
Calcium: 10.4 mg/dL — ABNORMAL HIGH (ref 8.9–10.3)
Calcium: 8.5 mg/dL — ABNORMAL LOW (ref 8.9–10.3)
Calcium: 8.8 mg/dL — ABNORMAL LOW (ref 8.9–10.3)

## 2022-01-29 LAB — SURGICAL PATHOLOGY

## 2022-01-29 MED ORDER — CALCITRIOL 0.5 MCG PO CAPS
2.0000 ug | ORAL_CAPSULE | Freq: Every day | ORAL | Status: DC
  Administered 2022-01-30: 2 ug via ORAL
  Filled 2022-01-29 (×2): qty 4

## 2022-01-29 MED ORDER — CALCIUM CARBONATE ANTACID 1250 MG/5ML PO SUSP
2000.0000 mg | Freq: Three times a day (TID) | ORAL | Status: DC
  Administered 2022-01-29 – 2022-01-30 (×3): 2000 mg via ORAL
  Filled 2022-01-29 (×2): qty 10

## 2022-01-29 NOTE — Plan of Care (Signed)

## 2022-01-29 NOTE — Progress Notes (Signed)
Patient just received her PO calcium and lab came to draw lab but this RN told him to come back at 1 am. We continue to monitor.

## 2022-01-29 NOTE — Progress Notes (Addendum)
Assessment & Plan: POD#4 - status post total parathyroidectomy with autotransplant to right forearm  In HD now - patient anticipates discharge home tomorrow morning  Calcium improved today > 10  Asymptomatic; normal voice; normal swallow  Home when stable from nephrology's standpoint.  Will anticipate tomorrow AM and await nephrology approval.  Normal dialysis schedule is MWF for this patient.        Mallory Gemma, MD [facility] A [place] practice Office: [phone]        Chief Complaint: Secondary hyperparathyroidism, ESRD  Subjective: Patient in HD, pleasant, happy to see me.  No complaints.  Objective: Vital signs in last 24 hours: Temp:  [98.1 F (36.7 C)-98.6 F (37 C)] 98.1 F (36.7 C) (07/31 1100) Pulse Rate:  [78-109] 79 (07/31 1500) Resp:  [10-17] 12 (07/31 1500) BP: (92-199)/(43-99) 199/93 (07/31 1500) SpO2:  [88 %-100 %] 92 % (07/31 1500) Last BM Date : 01/26/22  Intake/Output from previous day: 07/30 0701 - 07/31 0700 In: 400 [IV Piggyback:400] Out: -  Intake/Output this shift: No intake/output data recorded.  Physical Exam: HEENT - sclerae clear, mucous membranes moist Neck - wound dry and intact; Dermabond in place; minimal STS; voice normal Ext - no edema, non-tender; wound right forearm dry and intact with Dermabond, minimal STS Neuro - alert & oriented, no focal deficits  Lab Results:  Recent Labs    01/29/22 0057  WBC 6.2  HGB 9.3*  HCT 29.1*  PLT 154   BMET Recent Labs    01/26/22 1827 01/27/22 0052 01/28/22 2026 01/29/22 0629  NA 131*  --   --  130*  K 4.6  --   --  5.0  CL 90*  --   --  82*  CO2 27  --   --  30  GLUCOSE 137*  --   --  103*  BUN 36*  --   --  68*  CREATININE 6.45*  --   --  11.11*  CALCIUM 6.9*  6.9*   < > 8.9 10.4*  10.6*   < > = values in this interval not displayed.   PT/INR No results for input(s): "LABPROT", "INR" in the last 72 hours. Comprehensive Metabolic Panel:     Component Value Date/Time   NA 130 (L) 01/29/2022 0629   NA 131 (L) 01/26/2022 1827   K 5.0 01/29/2022 0629   K 4.6 01/26/2022 1827   CL 82 (L) 01/29/2022 0629   CL 90 (L) 01/26/2022 1827   CO2 30 01/29/2022 0629   CO2 27 01/26/2022 1827   BUN 68 (H) 01/29/2022 0629   BUN 36 (H) 01/26/2022 1827   CREATININE 11.11 (H) 01/29/2022 0629   CREATININE 6.45 (H) 01/26/2022 1827   GLUCOSE 103 (H) 01/29/2022 0629   GLUCOSE 137 (H) 01/26/2022 1827   CALCIUM 10.4 (H) 01/29/2022 0629   CALCIUM 10.6 (H) 01/29/2022 0629   AST 14 (L) 07/21/2018 0659   AST 14 (L) 07/19/2018 1744   ALT 11 07/21/2018 0659   ALT 12 07/19/2018 1744   ALKPHOS 62 07/21/2018 0659   ALKPHOS 57 07/19/2018 1744   BILITOT 0.6 07/21/2018 0659   BILITOT 0.4 07/19/2018 1744   PROT 5.8 (L) 07/21/2018 0659   PROT 6.2 (L) 07/19/2018 1744   ALBUMIN 3.2 (L) 01/29/2022 0629   ALBUMIN 3.7 01/26/2022 1827    Studies/Results: No results found.    Mallory Chavez 01/29/2022   Patient ID: Lezlie Lye, female   DOB: 07-03-1935, 86  y.o.   MRN: [id number]

## 2022-01-29 NOTE — Progress Notes (Signed)
Received patient in bed to unit. Alert and oriented. Informed consent signed and in  chart.   Treatment completed: 1518  Patient tolerated well. Fluid removal turned off toward the end of treatment due to BP decreasing. Transported back to the room. Patient alert, without acute distress. Hand-off given to patient's nurse.   Access used: LUA Fistula Access issues: n/a   Total UF removed: 0.9L  Medication(s) given: n/a  Post HD VS: 167/105, 88, 15, 98,  Post HD weight:  62.7kg    [facility]

## 2022-01-29 NOTE — Progress Notes (Addendum)
[facility] Progress Note   Subjective:   Patient seen and examined at bedside.  Nausea/vomiting improved.  Tolerated applesauce and pretzels last night.  A little soreness around incisions, otherwise feeling ok.  Denies CP, SOB, abdominal pain and diarrhea.   Objective Vitals:   01/28/22 2001 01/29/22 0422 01/29/22 0730 01/29/22 0902  BP: 108/61 (!) 110/57 (!) 113/55   Pulse: 79 91 (!) 109   Resp: '17 17 16 16  '$ Temp: 98.6 F (37 C) 98.1 F (36.7 C) 98.2 F (36.8 C)   TempSrc:   Oral   SpO2: 96% 99% 100% 100%  Weight:      Height:       Physical Exam General:WDWN female in NAD Heart:RRR, no mrg Lungs:CTAB, nml WOB Abdomen:soft, NTND Extremities:no LE edema Dialysis Access: LU AVF +b/t   Filed Weights   01/25/22 0649 01/26/22 1011  Weight: 64.9 kg 65.5 kg    Intake/Output Summary (Last 24 hours) at 01/29/2022 0939 Last data filed at 01/29/2022 0603 Gross per 24 hour  Intake 400 ml  Output --  Net 400 ml    Additional Objective Labs: Basic Metabolic Panel: Recent Labs  Lab 01/26/22 0423 01/26/22 1827 01/27/22 0052 01/28/22 1709 01/28/22 2026 01/29/22 0629  NA 135 131*  --   --   --  130*  K 5.0 4.6  --   --   --  5.0  CL 93* 90*  --   --   --  82*  CO2 28 27  --   --   --  30  GLUCOSE 102* 137*  --   --   --  103*  BUN 42* 36*  --   --   --  68*  CREATININE 7.90* 6.45*  --   --   --  11.11*  CALCIUM 7.1* 6.9*  6.9*   < > 7.7* 8.9 10.4*  10.6*  PHOS 3.7 2.6  --   --   --  3.6   < > = values in this interval not displayed.   Liver Function Tests: Recent Labs  Lab 01/26/22 0423 01/26/22 1827 01/29/22 0629  ALBUMIN 3.6 3.7 3.2*   CBC: Recent Labs  Lab 01/25/22 0758 01/25/22 0843 01/29/22 0057  WBC  --   --  6.2  NEUTROABS  --   --  4.3  HGB 12.2 12.6 9.3*  HCT 36.0 37.0 29.1*  MCV  --   --  103.9*  PLT  --   --  154    Medications:  sodium chloride      calcitRIOL  1.5 mcg Oral BID   calcium carbonate (dosed in mg  elemental calcium)  2,000 mg of elemental calcium Oral TID   Chlorhexidine Gluconate Cloth  6 each Topical Q0600   gabapentin  100 mg Oral QHS   mometasone-formoterol  2 puff Inhalation BID   polyvinyl alcohol  1 drop Both Eyes QHS    Dialysis Orders: MWF [facility] 3h 39mn  400/1.5   64.8kg  2/2 bath  P2  Heparin 4000 LUA AVF - last HD 7/26, came off at 64.6kg - last Hb 11.1 on 7/26 - calcitriol 0.5 mcg tiw po w/ hd (on hold) - etelcalcetide 15 mg iv tiw w/ hd (on hold) - iron sucrose 50 mg weekly   Assessment/Plan: SP total parathyroidectomy - w/ R forearm autotransplant, done 7/27 by Dr GHarlow Asa At risk of hungry bones syndrome. Calcium levels have been up and down, increased to 10.4 this  AM.  Stopped IV Calcium, Liquid CaCO3 reduced from 3gm QID (12g qd) to 2 gm TID (6gm qd) and calcitriol reduced from 1.5 mcg BID to 73mg qd, continue to titrate as needed. Patient remains asymptomatic. Continue q 4 hr Ca levels.  2.5Ca bag is the highest concentration available with HD treatments. If calcium remains stable can d/c home. Discussed this with patient and she verbalized understanding. ESRD - on HD MWF. Next HD 7/31. Use the highest Ca bath we have available. Volume- does not appear volume overloaded.  UF as tolerated.  HTN - BP's soft but stable-currently asymptomatic, not taking any BP lowering meds. Follow.  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  Continue to monitor.  If starts to trend up will start Calcium acetate.  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  HL - per pmd  LJen Mow PA-C CRobins AFB7/31/2023,9:39 AM  LOS: 2 days   Nephrology attending: The patient was seen and examined at dialysis unit.  Chart reviewed and I agree with above. ESRD on HD, secondary hyperparathyroidism is status post total parathyroidectomy with right forearm autotransplant done on 7/27.  There is fluctuation of calcium level with mildly elevated level this  morning.  IV calcium was discontinued and adjusted oral calcium and vitamin D level as discussed above.  Continue to take lab frequently.  Tolerating dialysis well today.  DKatheran James [facility].

## 2022-01-30 LAB — CBC
HCT: 32.7 % — ABNORMAL LOW (ref 36.0–46.0)
Hemoglobin: 10.8 g/dL — ABNORMAL LOW (ref 12.0–15.0)
MCH: 33.1 pg (ref 26.0–34.0)
MCHC: 33 g/dL (ref 30.0–36.0)
MCV: 100.3 fL — ABNORMAL HIGH (ref 80.0–100.0)
Platelets: 205 10*3/uL (ref 150–400)
RBC: 3.26 MIL/uL — ABNORMAL LOW (ref 3.87–5.11)
RDW: 14.1 % (ref 11.5–15.5)
WBC: 6.9 10*3/uL (ref 4.0–10.5)
nRBC: 0 % (ref 0.0–0.2)

## 2022-01-30 LAB — RENAL FUNCTION PANEL
Albumin: 3 g/dL — ABNORMAL LOW (ref 3.5–5.0)
Anion gap: 14 (ref 5–15)
BUN: 30 mg/dL — ABNORMAL HIGH (ref 8–23)
CO2: 27 mmol/L (ref 22–32)
Calcium: 7.6 mg/dL — ABNORMAL LOW (ref 8.9–10.3)
Chloride: 91 mmol/L — ABNORMAL LOW (ref 98–111)
Creatinine, Ser: 7 mg/dL — ABNORMAL HIGH (ref 0.44–1.00)
GFR, Estimated: 5 mL/min — ABNORMAL LOW (ref >60)
Glucose, Bld: 139 mg/dL — ABNORMAL HIGH (ref 70–99)
Phosphorus: 2.2 mg/dL — ABNORMAL LOW (ref 2.5–4.6)
Potassium: 3.7 mmol/L (ref 3.5–5.1)
Sodium: 132 mmol/L — ABNORMAL LOW (ref 135–145)

## 2022-01-30 LAB — CALCIUM
Calcium: 8.1 mg/dL — ABNORMAL LOW (ref 8.9–10.3)
Calcium: 7.6 mg/dL — ABNORMAL LOW (ref 8.9–10.3)
Calcium: 7.8 mg/dL — ABNORMAL LOW (ref 8.9–10.3)
Calcium: 7.4 mg/dL — ABNORMAL LOW (ref 8.9–10.3)
Calcium: 7.9 mg/dL — ABNORMAL LOW (ref 8.9–10.3)

## 2022-01-30 MED ORDER — CALCIUM CARBONATE ANTACID 1250 MG/5ML PO SUSP
2000.0000 mg | Freq: Four times a day (QID) | ORAL | Status: DC
  Administered 2022-01-30 (×3): 2000 mg via ORAL
  Filled 2022-01-30 (×3): qty 10

## 2022-01-30 MED ORDER — CALCITRIOL 0.5 MCG PO CAPS
2.5000 ug | ORAL_CAPSULE | Freq: Every day | ORAL | Status: DC
  Filled 2022-01-30: qty 5

## 2022-01-30 MED ORDER — CHLORHEXIDINE GLUCONATE CLOTH 2 % EX PADS
6.0000 | MEDICATED_PAD | Freq: Every day | CUTANEOUS | Status: DC
Start: 2022-01-30 — End: 2022-01-31
  Administered 2022-01-30 – 2022-01-31 (×2): 6 via TOPICAL

## 2022-01-30 NOTE — Progress Notes (Signed)
Assessment & Plan: POD#5 - status post total parathyroidectomy with autotransplant to right forearm             HD tomorrow AM - patient now wants to go home after HD tomorrow             Calcium remains in acceptable range - 8.1 this AM             Asymptomatic; normal voice; normal swallow  ACE wrap to right forearm   Home when stable from nephrology's standpoint.  Will anticipate tomorrow after HD and await nephrology approval.        Armandina Gemma, [facility] A [place] practice Office: [phone]        Chief Complaint: Secondary hyperparathyroidism  Subjective: Patient in bed, mild pain in right forearm  Objective: Vital signs in last 24 hours: Temp:  [97.8 F (36.6 C)-98.8 F (37.1 C)] 98.2 F (36.8 C) (08/01 1715) Pulse Rate:  [80-100] 80 (08/01 1715) Resp:  [16-18] 18 (08/01 1715) BP: (91-114)/(46-59) 108/56 (08/01 1715) SpO2:  [84 %-97 %] 94 % (08/01 1715) Last BM Date : 01/26/22  Intake/Output from previous day: 07/31 0701 - 08/01 0700 In: -  Out: 0.9  Intake/Output this shift: Total I/O In: 360 [P.O.:360] Out: -   Physical Exam: HEENT - sclerae clear, mucous membranes moist Neck - soft, wound dry and intact; non-tender Ext - right forearm with mild STS at surgical site; no drainage; no erythema Neuro - alert & oriented, no focal deficits  Lab Results:  Recent Labs    01/29/22 0057 01/30/22 0458  WBC 6.2 6.9  HGB 9.3* 10.8*  HCT 29.1* 32.7*  PLT 154 205   BMET Recent Labs    01/29/22 0629 01/29/22 1534 01/30/22 0941 01/30/22 1250  NA 130*  --  132*  --   K 5.0  --  3.7  --   CL 82*  --  91*  --   CO2 30  --  27  --   GLUCOSE 103*  --  139*  --   BUN 68*  --  30*  --   CREATININE 11.11*  --  7.00*  --   CALCIUM 10.4*  10.6*   < > 7.6*  7.6* 7.8*   < > = values in this interval not displayed.   PT/INR No results for input(s): "LABPROT", "INR" in the last 72 hours. Comprehensive Metabolic Panel:     Component Value Date/Time   NA 132 (L) 01/30/2022 0941   NA 130 (L) 01/29/2022 0629   K 3.7 01/30/2022 0941   K 5.0 01/29/2022 0629   CL 91 (L) 01/30/2022 0941   CL 82 (L) 01/29/2022 0629   CO2 27 01/30/2022 0941   CO2 30 01/29/2022 0629   BUN 30 (H) 01/30/2022 0941   BUN 68 (H) 01/29/2022 0629   CREATININE 7.00 (H) 01/30/2022 0941   CREATININE 11.11 (H) 01/29/2022 0629   GLUCOSE 139 (H) 01/30/2022 0941   GLUCOSE 103 (H) 01/29/2022 0629   CALCIUM 7.8 (L) 01/30/2022 1250   CALCIUM 7.6 (L) 01/30/2022 0941   CALCIUM 7.6 (L) 01/30/2022 0941   AST 14 (L) 07/21/2018 0659   AST 14 (L) 07/19/2018 1744   ALT 11 07/21/2018 0659   ALT 12 07/19/2018 1744   ALKPHOS 62 07/21/2018 0659   ALKPHOS 57 07/19/2018 1744   BILITOT 0.6 07/21/2018 0659   BILITOT 0.4 07/19/2018 1744   PROT 5.8 (L) 07/21/2018 5027  PROT 6.2 (L) 07/19/2018 1744   ALBUMIN 3.0 (L) 01/30/2022 0941   ALBUMIN 3.2 (L) 01/29/2022 0684    Studies/Results: No results found.    Armandina Gemma 01/30/2022   Patient ID: Mallory Chavez, female   DOB: Oct 06, 1935, 86 y.o.   MRN: [id number]

## 2022-01-30 NOTE — Progress Notes (Addendum)
[facility] Progress Note   Subjective:   Patient seen and examined at bedside.  Reports dizziness/lightheadedness after standing to use the bathroom with associated nausea.  Orthostatic vital signs ordered.  Placed patient in trendelenburg and notified nurse/NA. Patient reports nausea improving, but remains lightheaded.  She does not feel like she can go home today with the way she is feeling right now.   Objective Vitals:   01/29/22 1945 01/29/22 2011 01/30/22 0407 01/30/22 0856  BP: (!) 97/46  (!) 91/52 (!) 114/59  Pulse: 97  100 94  Resp: '16  18 18  '$ Temp: 98.3 F (36.8 C)  98.8 F (37.1 C) 97.8 F (36.6 C)  TempSrc: Oral  Oral Axillary  SpO2: 97% 97% 91% 92%  Weight:      Height:       Physical Exam General:WDWN female in NAD Heart:RRR, no mrg Lungs: +wheezing on L, otherwise clear Abdomen:soft, NTND Extremities:no LE edema Dialysis Access: LU AVF +b/t   Filed Weights   01/26/22 1011 01/29/22 1051 01/29/22 1550  Weight: 65.5 kg 65.8 kg 62.7 kg    Intake/Output Summary (Last 24 hours) at 01/30/2022 0934 Last data filed at 01/30/2022 0829 Gross per 24 hour  Intake 240 ml  Output 0.9 ml  Net 239.1 ml    Additional Objective Labs: Basic Metabolic Panel: Recent Labs  Lab 01/26/22 0423 01/26/22 1827 01/27/22 0052 01/29/22 0629 01/29/22 1534 01/29/22 1830 01/30/22 0458  NA 135 131*  --  130*  --   --   --   K 5.0 4.6  --  5.0  --   --   --   CL 93* 90*  --  82*  --   --   --   CO2 28 27  --  30  --   --   --   GLUCOSE 102* 137*  --  103*  --   --   --   BUN 42* 36*  --  68*  --   --   --   CREATININE 7.90* 6.45*  --  11.11*  --   --   --   CALCIUM 7.1* 6.9*  6.9*   < > 10.4*  10.6* 8.5* 8.8* 8.1*  PHOS 3.7 2.6  --  3.6  --   --   --    < > = values in this interval not displayed.   Liver Function Tests: Recent Labs  Lab 01/26/22 0423 01/26/22 1827 01/29/22 0629  ALBUMIN 3.6 3.7 3.2*    CBC: Recent Labs  Lab 01/25/22 0758  01/25/22 0843 01/29/22 0057  WBC  --   --  6.2  NEUTROABS  --   --  4.3  HGB 12.2 12.6 9.3*  HCT 36.0 37.0 29.1*  MCV  --   --  103.9*  PLT  --   --  154    Medications:  sodium chloride      calcitRIOL  2 mcg Oral Daily   calcium carbonate (dosed in mg elemental calcium)  2,000 mg of elemental calcium Oral TID   Chlorhexidine Gluconate Cloth  6 each Topical Q0600   gabapentin  100 mg Oral QHS   mometasone-formoterol  2 puff Inhalation BID   polyvinyl alcohol  1 drop Both Eyes QHS    Dialysis Orders: MWF [facility] 3h 83mn  400/1.5   64.8kg  2/2 bath  P2  Heparin 4000 LUA AVF - last HD 7/26, came off at 64.6kg - last Hb 11.1 on  7/26 - calcitriol 0.5 mcg tiw po w/ hd (on hold) - etelcalcetide 15 mg iv tiw w/ hd (on hold) - iron sucrose 50 mg weekly   Assessment/Plan: SP total parathyroidectomy - w/ R forearm autotransplant, done 7/27 by Dr Harlow Asa. At risk of hungry bones syndrome. Calcium levels have been up and down, increased to 10.4 yesterday - stopped IV Calcium, Liquid CaCO3 reduced from 3gm QID (12g qd) to 2 gm TID (6gm qd) and calcitriol reduced from 1.5 mcg BID to 41mg qd, continue to titrate as needed. Continue q 4 hr Ca levels. Calcium stabilized in the 8s overnight. 2.5Ca bag is the highest concentration available with HD treatments. If calcium remains stable can d/c home.  Dizziness/lightheadedness - likely due to hypotension.  Orthostatic vital signs ordered.   ESRD - on HD MWF. Next HD 8/2. Use the highest Ca bath we have available. Volume- does not appear volume overloaded.  UF as tolerated.  HTN - BP's soft but stable. not taking any BP medications. If symptomatic with orthostatic hypotension, may need to start midodrine.  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  Continue to monitor.  If starts to trend up will start Calcium acetate.  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  Start ESA.   HL - per pmd  Dispo - If calcium remains stable and  dizziness/lightheaded resolved can d/c home.   LJen Mow PA-C [facility] 01/30/2022,9:34 AM  LOS: 2 days   Nephrology attending: I have personally seen and examined the patient.  Chart reviewed.  I agree with above. Status post parathyroidectomy with hypocalcemia.  Calcium level is trending down therefore we will increase oral calcium and calcitriol.  Monitor lab.  Next HD tomorrow.  She was dizzy and lightheaded this morning.  Monitor blood pressure, minimize narcotics.  DKatheran James [facility].

## 2022-01-31 ENCOUNTER — Other Ambulatory Visit (HOSPITAL_COMMUNITY): Payer: Self-pay

## 2022-01-31 LAB — CBC
HCT: 30.9 % — ABNORMAL LOW (ref 36.0–46.0)
Hemoglobin: 10.2 g/dL — ABNORMAL LOW (ref 12.0–15.0)
MCH: 32.8 pg (ref 26.0–34.0)
MCHC: 33 g/dL (ref 30.0–36.0)
MCV: 99.4 fL (ref 80.0–100.0)
Platelets: 217 10*3/uL (ref 150–400)
RBC: 3.11 MIL/uL — ABNORMAL LOW (ref 3.87–5.11)
RDW: 14.2 % (ref 11.5–15.5)
WBC: 9 10*3/uL (ref 4.0–10.5)
nRBC: 0 % (ref 0.0–0.2)

## 2022-01-31 LAB — RENAL FUNCTION PANEL
Albumin: 3 g/dL — ABNORMAL LOW (ref 3.5–5.0)
Anion gap: 12 (ref 5–15)
BUN: 35 mg/dL — ABNORMAL HIGH (ref 8–23)
CO2: 27 mmol/L (ref 22–32)
Calcium: 8.1 mg/dL — ABNORMAL LOW (ref 8.9–10.3)
Chloride: 89 mmol/L — ABNORMAL LOW (ref 98–111)
Creatinine, Ser: 8.38 mg/dL — ABNORMAL HIGH (ref 0.44–1.00)
GFR, Estimated: 4 mL/min — ABNORMAL LOW (ref >60)
Glucose, Bld: 124 mg/dL — ABNORMAL HIGH (ref 70–99)
Phosphorus: 2.6 mg/dL (ref 2.5–4.6)
Potassium: 4.1 mmol/L (ref 3.5–5.1)
Sodium: 128 mmol/L — ABNORMAL LOW (ref 135–145)

## 2022-01-31 LAB — CALCIUM
Calcium: 7.9 mg/dL — ABNORMAL LOW (ref 8.9–10.3)
Calcium: 8 mg/dL — ABNORMAL LOW (ref 8.9–10.3)
Calcium: 8.1 mg/dL — ABNORMAL LOW (ref 8.9–10.3)

## 2022-01-31 MED ORDER — CALCITRIOL 0.5 MCG PO CAPS
ORAL_CAPSULE | ORAL | 6 refills | Status: DC
  Filled 2022-01-31: qty 155, 31d supply, fill #0

## 2022-01-31 MED ORDER — CALCIUM CARBONATE 1250 (500 CA) MG PO TABS: ORAL_TABLET | ORAL | 6 refills | Status: DC

## 2022-01-31 MED ORDER — OXYCODONE HCL 5 MG PO TABS
5.0000 mg | ORAL_TABLET | Freq: Four times a day (QID) | ORAL | 0 refills | Status: DC | PRN
  Filled 2022-01-31: qty 15, 4d supply, fill #0

## 2022-01-31 NOTE — Progress Notes (Signed)
Discharge instructions given to patient , patient verbalized understanding of all teaching. Patient discharged to home with daughter with all belongings

## 2022-01-31 NOTE — Progress Notes (Signed)
Patient arrived back to [facility] room 1 from dialysis alert and oriented. Bed In lowest position call light in reach. Will continue to monitor patient

## 2022-01-31 NOTE — Procedures (Signed)
Patient was seen on dialysis and the procedure was supervised.  BFR 400  Via AVF BP is  100/50.   Patient appears to be tolerating treatment well. Ca improved, continue calcium and calcitriol.  Ok to discharge from renal perspective,  Next HD on Friday.  Carolyna Yerian Tanna Furry 01/31/2022

## 2022-01-31 NOTE — Progress Notes (Addendum)
[facility] Progress Note   Subjective:   Patient seen and examined at bedside in dialysis. Tolerating treatment well. Calcium now appears stabilized in the 8's. Dizziness/lightheadedness resolved.  Denies CP, SOB, abdominal pain and n/v/d. Excited to be discharged home today.  Objective Vitals:   01/31/22 1030 01/31/22 1100 01/31/22 1130 01/31/22 1200  BP: (!) 118/58 96/67 114/72 112/62  Pulse: 67 73 93 70  Resp: '10 11 11 11  '$ Temp:      TempSrc:      SpO2: 100% 96% (!) 75% 98%  Weight:      Height:       Physical Exam General:well appearing female in NAD Heart:RRR, no mrg Lungs:CTAB, nml WOB on RA Abdomen:soft, NTND Extremities:no LE edema Dialysis Access: LU AVF in use   Filed Weights   01/29/22 1051 01/29/22 1550 01/31/22 0824  Weight: 65.8 kg 62.7 kg 66.7 kg    Intake/Output Summary (Last 24 hours) at 01/31/2022 1211 Last data filed at 01/30/2022 2004 Gross per 24 hour  Intake 240 ml  Output --  Net 240 ml    Additional Objective Labs: Basic Metabolic Panel: Recent Labs  Lab 01/29/22 0629 01/29/22 1534 01/30/22 0941 01/30/22 1250 01/31/22 0126 01/31/22 0601 01/31/22 0849  NA 130*  --  132*  --   --   --  128*  K 5.0  --  3.7  --   --   --  4.1  CL 82*  --  91*  --   --   --  89*  CO2 30  --  27  --   --   --  27  GLUCOSE 103*  --  139*  --   --   --  124*  BUN 68*  --  30*  --   --   --  35*  CREATININE 11.11*  --  7.00*  --   --   --  8.38*  CALCIUM 10.4*  10.6*   < > 7.6*  7.6*   < > 7.9* 8.0* 8.1*  8.1*  PHOS 3.6  --  2.2*  --   --   --  2.6   < > = values in this interval not displayed.   Liver Function Tests: Recent Labs  Lab 01/29/22 0629 01/30/22 0941 01/31/22 0849  ALBUMIN 3.2* 3.0* 3.0*    CBC: Recent Labs  Lab 01/29/22 0057 01/30/22 0458 01/31/22 0849  WBC 6.2 6.9 9.0  NEUTROABS 4.3  --   --   HGB 9.3* 10.8* 10.2*  HCT 29.1* 32.7* 30.9*  MCV 103.9* 100.3* 99.4  PLT 154 205 217    Medications:  sodium  chloride      calcitRIOL  2.5 mcg Oral Daily   calcium carbonate (dosed in mg elemental calcium)  2,000 mg of elemental calcium Oral QID   Chlorhexidine Gluconate Cloth  6 each Topical Q0600   Chlorhexidine Gluconate Cloth  6 each Topical Q0600   gabapentin  100 mg Oral QHS   mometasone-formoterol  2 puff Inhalation BID   polyvinyl alcohol  1 drop Both Eyes QHS    Dialysis Orders: MWF [facility] 3h 62mn  400/1.5   64.8kg  2/2 bath  P2  Heparin 4000 LUA AVF - last HD 7/26, came off at 64.6kg - last Hb 11.1 on 7/26 - calcitriol 0.5 mcg tiw po w/ hd (on hold) - etelcalcetide 15 mg iv tiw w/ hd (on hold) - iron sucrose 50 mg weekly   Assessment/Plan: SP  total parathyroidectomy - w/ R forearm autotransplant, done 7/27 by Dr Harlow Asa. At risk of hungry bones syndrome. Calcium levels have been up and down, w/CCa stabilized in the 8s.  Liquid CaCO3 increased to 2 gm QID (8gm qd) and calcitriol increased to 2.5 mcg qd, continue to titrate as needed.  Continue to use increased Ca bath with HD and check Ca every treatment for the next couple weeks to ensure stable in goal.  Dizziness/lightheadedness - resolved.  ESRD - on HD MWF. Next HD 8/2. Use the highest Ca bath we have available. Volume- does not appear volume overloaded.  UF to dry weight as tolerated.   HTN - BP's soft but stable. Asymptomatic today.  Not taking any BP medications.  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  Continue to monitor.  If starts to trend up will start Calcium acetate.  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  HL - per pmd  Dispo - ok to d/c from renal standpoint.   Jen Mow, PA-C [facility] 01/31/2022,12:11 PM  LOS: 3 days   Nephrology attending: The patient was seen and examined.  Chart reviewed.  I agree with above. The calcium level improved therefore I recommend her to continue oral calcium and calcitriol after discharge.  Check labs as outpatient.  Clinically stable and  doing well.  Katheran James, [facility].

## 2022-01-31 NOTE — Discharge Summary (Signed)
Physician Discharge Summary   Patient ID: KISSA CAMPOY MRN: [id number] DOB/AGE: 1936/04/13 86 y.o.  Admit date: 01/25/2022  Discharge date: 01/31/2022  Discharge Diagnoses:  Principal Problem:   Hyperparathyroidism, secondary [facility]) Active Problems:   Secondary hyperparathyroidism of renal origin [facility])   Discharged Condition: good  Hospital Course: Patient was admitted for observation following parathyroid surgery.  Post op course was uncomplicated.  Pain was well controlled.  Tolerated diet.  Patient was managed by nephrology for hemodialysis and management of hypocalcemia following total parathyroidectomy.  Calcium levels finally stabilized at 8.1 mg/dl on the day of discharge. Patient was prepared for discharge home on POD#6.  Consults: nephrology  Treatments: surgery: total parathyroidectomy with autotransplant to right forearm  Discharge Exam: Blood pressure 112/62, pulse 70, temperature 98.5 F (36.9 C), temperature source Oral, resp. rate 11, height '5\' 4"'$  (1.626 m), weight 66.7 kg, SpO2 98 %. See progress notes.  Disposition: Home  Discharge Instructions     Diet - low sodium heart healthy   Complete by: As directed    Increase activity slowly   Complete by: As directed    No dressing needed   Complete by: As directed       Allergies as of 01/31/2022   No Known Allergies      Medication List     TAKE these medications    acetaminophen 500 MG tablet Commonly known as: TYLENOL Take 500 mg by mouth daily as needed for mild pain or moderate pain.   albuterol 108 (90 Base) MCG/ACT inhaler Commonly known as: VENTOLIN HFA Inhale 2 puffs by mouth into the lungs every 4 hours as needed for wheezing or shortness of breath   aspirin EC 81 MG tablet Take 81 mg by mouth daily after supper.   colchicine 0.6 MG tablet Take 1 tablet by mouth once daily as needed What changed:  how much to take how to take this when to take this reasons to take this    cyclobenzaprine 5 MG tablet Commonly known as: FLEXERIL Take 1 tablet by mouth at bedtime   DIALYVITE 800 WITH ZINC 0.8 MG Tabs Take 1 tablet by mouth daily.   DRY EYE RELIEF DROPS OP Place 1 drop into both eyes at bedtime.   famotidine 40 MG tablet Commonly known as: PEPCID Take 1 tablet by mouth once daily   gabapentin 100 MG capsule Commonly known as: NEURONTIN Take 1 capsule (100 mg total) by mouth at bedtime.   lidocaine-prilocaine cream Commonly known as: EMLA Apply a small amount to skin as directed Apply to AVF- prior to dialysis   lidocaine-prilocaine cream Commonly known as: EMLA Apply  a small amount to skin as directed to AVF- prior to dialysis   loratadine 10 MG tablet Commonly known as: CLARITIN Take 10 mg by mouth daily after supper.   OXYGEN Inhale 1 L into the lungs See admin instructions. with sleep and exertion as needed [facility]   sevelamer carbonate 800 MG tablet Commonly known as: RENVELA Take 1,600 mg by mouth 3 (three) times daily.   Symbicort 160-4.5 MCG/ACT inhaler Generic drug: budesonide-formoterol Inhale 2 puffs into the lungs 2 (two) times daily.               Discharge Care Instructions  (From admission, onward)           Start     Ordered   01/31/22 0000  No dressing needed        01/31/22 1207  Follow-up Information     Armandina Gemma, MD. Schedule an appointment as soon as possible for a visit in 2 week(s).   Specialty: General Surgery Why: For wound re-check Contact information: [place] [phone]                 Khylie Larmore, [facility] Office: [phone]   Signed: Armandina Gemma 01/31/2022, 12:07 PM

## 2022-01-31 NOTE — Care Management Important Message (Signed)
Important Message  Patient Details  Name: Mallory Chavez MRN: [id number] Date of Birth: 02/08/1936   Medicare Important Message Given:  Yes     [name] 01/31/2022, 12:59 PM

## 2022-01-31 NOTE — Discharge Instructions (Signed)
[facility] - Dr. Orvetta Danielski  THYROID & PARATHYROID SURGERY:  POST-OP INSTRUCTIONS  Always review the instruction sheet provided by the hospital nurse at discharge.  A prescription for pain medication may be sent to your pharmacy at the time of discharge.  Take your pain medication as prescribed.  If narcotic pain medicine is not needed, then you may take acetaminophen (Tylenol) or ibuprofen (Advil) as needed for pain or soreness.  Take your normal home medications as prescribed unless otherwise directed.  If you need a refill on your pain medication, please contact the office during regular business hours.  Prescriptions will not be processed by the office after 5:00PM or on weekends.  Start with a light diet upon arrival home, such as soup and crackers or toast.  Be sure to drink plenty of fluids.  Resume your normal diet the day after surgery.  Most patients will experience some swelling and bruising on the chest and neck area.  Ice packs will help for the first 48 hours after arriving home.  Swelling and bruising will take several days to resolve.   It is common to experience some constipation after surgery.  Increasing fluid intake and taking a stool softener (Colace) will usually help to prevent this problem.  A mild laxative (Milk of Magnesia or Miralax) should be taken according to package directions if there has been no bowel movement after 48 hours.  Dermabond glue covers your incision. This seals the wound and you may shower at any time. The Dermabond will remain in place for about a week.  You may gradually remove the glue when it loosens around the edges.  If you need to loosen the Dermabond for removal, apply a layer of Vaseline to the wound for 15 minutes and then remove with a Kleenex. Your sutures are under the skin and will not show - they will dissolve on their own.  You may resume light daily activities beginning the day after discharge (such as self-care,  walking, climbing stairs), gradually increasing activities as tolerated. You may have sexual intercourse when it is comfortable. Refrain from any heavy lifting or straining until approved by your doctor. You may drive when you no longer are taking prescription pain medication, you can comfortably wear a seatbelt, and you can safely maneuver your car and apply the brakes.  You will see your doctor in the office for a follow-up appointment approximately three weeks after your surgery.  Make sure that you call for this appointment within a day or two after you arrive home to insure a convenient appointment time. Please have any requested laboratory tests performed a few days prior to your office visit so that the results will be available at your follow up appointment.  WHEN TO CALL THE [facility] OFFICE: -- Fever greater than 101.5 -- Inability to urinate -- Nausea and/or vomiting - persistent -- Extreme swelling or bruising -- Continued bleeding from incision -- Increased pain, redness, or drainage from the incision -- Difficulty swallowing or breathing -- Muscle cramping or spasms -- Numbness or tingling in hands or around lips  The clinic staff is available to answer your questions during regular business hours.  Please don't hesitate to call and ask to speak to one of the nurses if you have concerns.  [facility] OFFICE: [phone] (24 hours)  Please sign up for MyChart accounts. This will allow you to communicate directly with my nurse or myself without having to call the office. It will also allow you   to view your test results. You will need to enroll in MyChart for my office ([facility]) and for the hospital ([facility]).  Tallis Soledad, MD [facility] practice 

## 2022-01-31 NOTE — Progress Notes (Signed)
Patient transferred to dialysis via bed by transportation staff

## 2022-01-31 NOTE — Progress Notes (Signed)
D/C order noted. Contacted [facility] to make clinic aware of pt's d/c today and that pt will resume care on Friday.   Melven Sartorius Renal Navigator [phone]

## 2022-01-31 NOTE — TOC Transition Note (Signed)
Transition of Care [facility]) - CM/SW Discharge Note   Patient Details  Name: Mallory Chavez MRN: [id number] Date of Birth: 1936/02/04  Transition of Care [facility]) CM/SW Contact:  Carles Collet, RN Phone Number: 01/31/2022, 1:44 PM   Clinical Narrative:    Patient currently in HD with order to DC after HD. Spoke with patient's daughter, [name], who states that she will be picking her mother up from the hospital when ready to go. She has all needed DME at home, no TOC needs identified for DC. Nurse updated to call daughter when patient returns to the floor   McKinney,Carolyn (Daughter)  [phone]     Final next level of care: Home/Self Care Barriers to Discharge: No Barriers Identified   Patient Goals and CMS Choice Patient states their goals for this hospitalization and ongoing recovery are:: to go home      Discharge Placement                       Discharge Plan and Services                                     Social Determinants of Health (SDOH) Interventions     Readmission Risk Interventions     No data to display

## 2022-01-31 NOTE — Progress Notes (Signed)
Report called to [place] in dialysis.

## 2022-02-01 ENCOUNTER — Telehealth: Payer: Self-pay | Admitting: Nephrology

## 2022-02-01 ENCOUNTER — Other Ambulatory Visit (HOSPITAL_COMMUNITY): Payer: Self-pay

## 2022-02-01 NOTE — Telephone Encounter (Signed)
Transition of Care Contact from [place]   Date of Discharge: 01/31/22 Date of Contact: 02/01/22 Method of contact: phone Talked to patient   Patient contacted to discuss transition of care form recent hospitaliztion. Patient was admitted to [facility] from 01/25/22 to 01/31/22 with the discharge diagnosis of secondary hyperparathyroidism s/p total parathyroidectomy.     Medication changes were reviewed. Pharm only had 1/2 the Rx for CaCO3 and is to mail the rest when they are able.  Patient will follow up with is outpatient dialysis center 02/02/22.  No other needs identified.    Jen Mow, PA-C [facility] Pager: [phone]

## 2022-02-08 ENCOUNTER — Encounter (HOSPITAL_COMMUNITY): Payer: Self-pay

## 2022-02-08 ENCOUNTER — Emergency Department (HOSPITAL_COMMUNITY): Payer: Medicare Other

## 2022-02-08 ENCOUNTER — Inpatient Hospital Stay (HOSPITAL_COMMUNITY)
Admission: EM | Admit: 2022-02-08 | Discharge: 2022-02-16 | DRG: 388 | Disposition: A | Discharge status: Home Health | Payer: Medicare Other | Attending: Family Medicine | Admitting: Family Medicine

## 2022-02-08 DIAGNOSIS — E876 Hypokalemia: Secondary | ICD-10-CM | POA: Diagnosis present

## 2022-02-08 DIAGNOSIS — R197 Diarrhea, unspecified: Secondary | ICD-10-CM | POA: Diagnosis not present

## 2022-02-08 DIAGNOSIS — K5641 Fecal impaction: Principal | ICD-10-CM

## 2022-02-08 DIAGNOSIS — I5042 Chronic combined systolic (congestive) and diastolic (congestive) heart failure: Secondary | ICD-10-CM | POA: Diagnosis present

## 2022-02-08 DIAGNOSIS — Z7982 Long term (current) use of aspirin: Secondary | ICD-10-CM

## 2022-02-08 DIAGNOSIS — Z79899 Other long term (current) drug therapy: Secondary | ICD-10-CM

## 2022-02-08 DIAGNOSIS — E892 Postprocedural hypoparathyroidism: Secondary | ICD-10-CM | POA: Diagnosis present

## 2022-02-08 DIAGNOSIS — Z992 Dependence on renal dialysis: Secondary | ICD-10-CM

## 2022-02-08 DIAGNOSIS — Z825 Family history of asthma and other chronic lower respiratory diseases: Secondary | ICD-10-CM

## 2022-02-08 DIAGNOSIS — Z833 Family history of diabetes mellitus: Secondary | ICD-10-CM

## 2022-02-08 DIAGNOSIS — N186 End stage renal disease: Secondary | ICD-10-CM

## 2022-02-08 DIAGNOSIS — Z7951 Long term (current) use of inhaled steroids: Secondary | ICD-10-CM

## 2022-02-08 DIAGNOSIS — J449 Chronic obstructive pulmonary disease, unspecified: Secondary | ICD-10-CM

## 2022-02-08 DIAGNOSIS — I132 Hypertensive heart and chronic kidney disease with heart failure and with stage 5 chronic kidney disease, or end stage renal disease: Secondary | ICD-10-CM | POA: Diagnosis present

## 2022-02-08 DIAGNOSIS — D631 Anemia in chronic kidney disease: Secondary | ICD-10-CM | POA: Diagnosis present

## 2022-02-08 DIAGNOSIS — E785 Hyperlipidemia, unspecified: Secondary | ICD-10-CM | POA: Diagnosis present

## 2022-02-08 DIAGNOSIS — R159 Full incontinence of feces: Secondary | ICD-10-CM | POA: Diagnosis present

## 2022-02-08 DIAGNOSIS — Z87891 Personal history of nicotine dependence: Secondary | ICD-10-CM

## 2022-02-08 DIAGNOSIS — Z90710 Acquired absence of both cervix and uterus: Secondary | ICD-10-CM

## 2022-02-08 DIAGNOSIS — K219 Gastro-esophageal reflux disease without esophagitis: Secondary | ICD-10-CM | POA: Diagnosis present

## 2022-02-08 DIAGNOSIS — J9611 Chronic respiratory failure with hypoxia: Secondary | ICD-10-CM

## 2022-02-08 DIAGNOSIS — M1990 Unspecified osteoarthritis, unspecified site: Secondary | ICD-10-CM | POA: Diagnosis present

## 2022-02-08 DIAGNOSIS — M898X9 Other specified disorders of bone, unspecified site: Secondary | ICD-10-CM | POA: Diagnosis present

## 2022-02-08 DIAGNOSIS — M109 Gout, unspecified: Secondary | ICD-10-CM | POA: Diagnosis present

## 2022-02-08 DIAGNOSIS — Z9981 Dependence on supplemental oxygen: Secondary | ICD-10-CM

## 2022-02-08 DIAGNOSIS — K567 Ileus, unspecified: Secondary | ICD-10-CM | POA: Diagnosis present

## 2022-02-08 LAB — COMPREHENSIVE METABOLIC PANEL
ALT: 15 U/L (ref 0–44)
AST: 49 U/L — ABNORMAL HIGH (ref 15–41)
Albumin: 3.1 g/dL — ABNORMAL LOW (ref 3.5–5.0)
Alkaline Phosphatase: 530 U/L — ABNORMAL HIGH (ref 38–126)
Anion gap: 14 (ref 5–15)
BUN: 23 mg/dL (ref 8–23)
CO2: 28 mmol/L (ref 22–32)
Calcium: 6.6 mg/dL — ABNORMAL LOW (ref 8.9–10.3)
Chloride: 94 mmol/L — ABNORMAL LOW (ref 98–111)
Creatinine, Ser: 6.71 mg/dL — ABNORMAL HIGH (ref 0.44–1.00)
GFR, Estimated: 6 mL/min — ABNORMAL LOW (ref >60)
Glucose, Bld: 135 mg/dL — ABNORMAL HIGH (ref 70–99)
Potassium: 4 mmol/L (ref 3.5–5.1)
Sodium: 136 mmol/L (ref 135–145)
Total Bilirubin: 0.5 mg/dL (ref 0.3–1.2)
Total Protein: 6.9 g/dL (ref 6.5–8.1)

## 2022-02-08 LAB — CBC WITH DIFFERENTIAL/PLATELET
Abs Immature Granulocytes: 0.15 10*3/uL — ABNORMAL HIGH (ref 0.00–0.07)
Basophils Absolute: 0.1 10*3/uL (ref 0.0–0.1)
Basophils Relative: 0 %
Eosinophils Absolute: 0.1 10*3/uL (ref 0.0–0.5)
Eosinophils Relative: 0 %
HCT: 27.9 % — ABNORMAL LOW (ref 36.0–46.0)
Hemoglobin: 9 g/dL — ABNORMAL LOW (ref 12.0–15.0)
Immature Granulocytes: 1 %
Lymphocytes Relative: 7 %
Lymphs Abs: 1.3 10*3/uL (ref 0.7–4.0)
MCH: 33.6 pg (ref 26.0–34.0)
MCHC: 32.3 g/dL (ref 30.0–36.0)
MCV: 104.1 fL — ABNORMAL HIGH (ref 80.0–100.0)
Monocytes Absolute: 1.5 10*3/uL — ABNORMAL HIGH (ref 0.1–1.0)
Monocytes Relative: 8 %
Neutro Abs: 16.2 10*3/uL — ABNORMAL HIGH (ref 1.7–7.7)
Neutrophils Relative %: 84 %
Platelets: 204 10*3/uL (ref 150–400)
RBC: 2.68 MIL/uL — ABNORMAL LOW (ref 3.87–5.11)
RDW: 15.9 % — ABNORMAL HIGH (ref 11.5–15.5)
WBC: 19.3 10*3/uL — ABNORMAL HIGH (ref 4.0–10.5)
nRBC: 0 % (ref 0.0–0.2)

## 2022-02-08 LAB — LIPASE, BLOOD: Lipase: 30 U/L (ref 11–51)

## 2022-02-08 LAB — C DIFFICILE QUICK SCREEN W PCR REFLEX
C Diff antigen: NEGATIVE
C Diff interpretation: NOT DETECTED
C Diff toxin: NEGATIVE

## 2022-02-08 LAB — I-STAT CHEM 8, ED
BUN: 25 mg/dL — ABNORMAL HIGH (ref 8–23)
Calcium, Ion: 0.72 mmol/L — CL (ref 1.15–1.40)
Chloride: 96 mmol/L — ABNORMAL LOW (ref 98–111)
Creatinine, Ser: 7.1 mg/dL — ABNORMAL HIGH (ref 0.44–1.00)
Glucose, Bld: 129 mg/dL — ABNORMAL HIGH (ref 70–99)
HCT: 27 % — ABNORMAL LOW (ref 36.0–46.0)
Hemoglobin: 9.2 g/dL — ABNORMAL LOW (ref 12.0–15.0)
Potassium: 4 mmol/L (ref 3.5–5.1)
Sodium: 133 mmol/L — ABNORMAL LOW (ref 135–145)
TCO2: 28 mmol/L (ref 22–32)

## 2022-02-08 LAB — MAGNESIUM: Magnesium: 3.3 mg/dL — ABNORMAL HIGH (ref 1.7–2.4)

## 2022-02-08 MED ORDER — SODIUM CHLORIDE 0.9% FLUSH
3.0000 mL | Freq: Two times a day (BID) | INTRAVENOUS | Status: DC
  Administered 2022-02-09 – 2022-02-16 (×8): 3 mL via INTRAVENOUS

## 2022-02-08 MED ORDER — LACTULOSE 10 GM/15ML PO SOLN
20.0000 g | Freq: Every day | ORAL | Status: DC
  Administered 2022-02-09 – 2022-02-11 (×4): 20 g via ORAL
  Filled 2022-02-08 (×4): qty 30

## 2022-02-08 MED ORDER — HEPARIN SODIUM (PORCINE) 5000 UNIT/ML IJ SOLN
5000.0000 [IU] | Freq: Three times a day (TID) | INTRAMUSCULAR | Status: DC
  Administered 2022-02-09 – 2022-02-16 (×19): 5000 [IU] via SUBCUTANEOUS
  Filled 2022-02-08 (×19): qty 1

## 2022-02-08 MED ORDER — ACETAMINOPHEN 325 MG PO TABS
650.0000 mg | ORAL_TABLET | Freq: Four times a day (QID) | ORAL | Status: DC | PRN
  Administered 2022-02-09 – 2022-02-14 (×5): 650 mg via ORAL
  Filled 2022-02-08 (×5): qty 2

## 2022-02-08 MED ORDER — DOCUSATE SODIUM 283 MG RE ENEM
1.0000 | ENEMA | Freq: Once | RECTAL | Status: AC
  Administered 2022-02-09: 283 mg via RECTAL
  Filled 2022-02-08: qty 1

## 2022-02-08 MED ORDER — SODIUM CHLORIDE 0.9 % IV SOLN
1.0000 g | Freq: Once | INTRAVENOUS | Status: DC
  Administered 2022-02-08: 1 g via INTRAVENOUS
  Filled 2022-02-08: qty 10

## 2022-02-08 MED ORDER — METRONIDAZOLE 500 MG/100ML IV SOLN
500.0000 mg | Freq: Two times a day (BID) | INTRAVENOUS | Status: DC
Start: 2022-02-08 — End: 2022-02-08
  Administered 2022-02-08: 500 mg via INTRAVENOUS
  Filled 2022-02-08: qty 100

## 2022-02-08 MED ORDER — ACETAMINOPHEN 650 MG RE SUPP: 650.0000 mg | Freq: Four times a day (QID) | RECTAL | Status: DC | PRN

## 2022-02-08 NOTE — Progress Notes (Signed)
      Need for home oxygen is clinically undetermined from a general surgery standpoint.  Would request further information from either primary care physician or pulmonologist.  Armandina Gemma, [facility] Office: [phone]

## 2022-02-08 NOTE — ED Triage Notes (Addendum)
Pt BIBA c/o diarrhea x2 days. Pt reports was constipated for 2 days took a supp yesterday and has been having diarrhea without abd cramping since. Pt is a MWF dialysis.

## 2022-02-08 NOTE — ED Provider Notes (Signed)
[facility] EMERGENCY DEPARTMENT Provider Note   CSN: [id number] Arrival date & time: 02/08/22  2725     History  Chief Complaint  Patient presents with   Diarrhea    Mallory Chavez is a 86 y.o. female PMH HTN, COPD, CHF, ESRD (HD M/W/F) who is presenting with diarrhea.  Patient reports that diarrhea started 2 days ago.  It is voluminous, watery, and profuse.  She says that it felt as if "the diarrhea did not stop all times today".  She denies, fever, abdominal pain.  Notes 1 episode of emesis yesterday, that has not recurred.  She has been sipping on fluids throughout the day.  She denies any recent antibiotic use, travel, sick contacts.  She did undergo parathyroidectomy with autotransplantation to her right arm in late July and was hospitalized for a week.      Home Medications Prior to Admission medications   Medication Sig Start Date End Date Taking? Authorizing Provider  acetaminophen (TYLENOL) 500 MG tablet Take 500 mg by mouth daily as needed for mild pain or moderate pain.    [provider]  albuterol (VENTOLIN HFA) 108 (90 Base) MCG/ACT inhaler Inhale 2 puffs by mouth into the lungs every 4 hours as needed for wheezing or shortness of breath 10/03/20   Tanda Rockers, MD  aspirin EC 81 MG tablet Take 81 mg by mouth daily after supper.    [provider]  B Complex-C-Zn-Folic Acid (DIALYVITE 366 WITH ZINC) 0.8 MG TABS Take 1 tablet by mouth daily. 05/03/20   [provider]  budesonide-formoterol (SYMBICORT) 160-4.5 MCG/ACT inhaler Inhale 2 puffs into the lungs 2 (two) times daily. 10/20/21   Tanda Rockers, MD  calcitRIOL (ROCALTROL) 0.5 MCG capsule Take 5 capsule by mouth once a day 01/31/22     calcium carbonate (OS-CAL - DOSED IN MG OF ELEMENTAL CALCIUM) 1250 (500 Ca) MG tablet Take 4 tablets by mouth every 6 hours (do not take with food) 01/31/22     colchicine 0.6 MG tablet Take 1 tablet by mouth once daily as needed Patient  taking differently: Take 0.6 mg by mouth daily as needed (Gout). 10/03/20   [name], MD  cyclobenzaprine (FLEXERIL) 5 MG tablet Take 1 tablet by mouth at bedtime 05/23/21   [name], MD  famotidine (PEPCID) 40 MG tablet Take 1 tablet by mouth once daily 05/23/21   [name], MD  gabapentin (NEURONTIN) 100 MG capsule Take 1 capsule (100 mg total) by mouth at bedtime. 08/25/21   Edrick Kins, DPM  Glycerin-Hypromellose-PEG 400 (DRY EYE RELIEF DROPS OP) Place 1 drop into both eyes at bedtime.    [provider]  lidocaine-prilocaine (EMLA) cream Apply a small amount to skin as directed Apply to AVF- prior to dialysis Patient not taking: Reported on 01/16/2022 08/28/21     lidocaine-prilocaine (EMLA) cream Apply  a small amount to skin as directed to AVF- prior to dialysis 12/22/21     loratadine (CLARITIN) 10 MG tablet Take 10 mg by mouth daily after supper.    [provider]  oxyCODONE (OXY IR/ROXICODONE) 5 MG immediate release tablet Take 1 tablet (5 mg total) by mouth every 6 (six) hours as needed for moderate pain. 01/31/22   Armandina Gemma, MD  OXYGEN Inhale 1 L into the lungs See admin instructions. with sleep and exertion as needed [facility]    [provider]  sevelamer carbonate (RENVELA) 800 MG tablet Take 1,600 mg by mouth  3 (three) times daily. 12/06/21   [provider]      Allergies    Patient has no known allergies.    Review of Systems   Review of Systems  Gastrointestinal:  Positive for diarrhea.    Physical Exam Updated Vital Signs Temp 99.2 F (37.3 C) (Oral)   Ht '5\' 4"'$  (1.626 m)   Wt 64.9 kg   BMI 24.55 kg/m   Physical Exam Vitals and nursing note reviewed.  Constitutional:      General: She is not in acute distress.    Appearance: Normal appearance. She is well-developed. She is not ill-appearing or diaphoretic.  HENT:     Head: Normocephalic and atraumatic.     Right Ear: External ear normal.     Left Ear: External ear  normal.     Nose: Nose normal.  Cardiovascular:     Rate and Rhythm: Normal rate and regular rhythm.     Heart sounds: Murmur heard.  Pulmonary:     Effort: Pulmonary effort is normal. No respiratory distress.     Breath sounds: Normal breath sounds. No wheezing.  Abdominal:     General: There is no distension.     Palpations: Abdomen is soft.     Tenderness: There is no abdominal tenderness. There is no guarding.  Musculoskeletal:     Cervical back: Neck supple.     Right lower leg: No edema.     Left lower leg: No edema.  Skin:    General: Skin is warm and dry.  Neurological:     Mental Status: She is alert.     ED Results / Procedures / Treatments   Labs (all labs ordered are listed, but only abnormal results are displayed) Labs Reviewed - No data to display  EKG None  Radiology No results found.  Procedures Procedures    Medications Ordered in ED Medications - No data to display  ED Course/ Medical Decision Making/ A&P                           Medical Decision Making Amount and/or Complexity of Data Reviewed Labs: ordered. Radiology: ordered.  Risk Decision regarding hospitalization.   Mallory Chavez is a 86 y.o. female PMH HTN, COPD, CHF, ESRD (HD M/W/F) who is presenting with diarrhea.   Vitals at presentation within normal limits.  Patient is hemodynamically stable, afebrile, satting well on room air.  Physical exam reassuring.  Normal heart sounds.  Lungs clear to auscultation bilaterally.  Abdomen is soft, nontender to palpation.  No pitting edema.  Rectal exam reveals soft stool in vault without evidence of hard stool/impaction.  Initial differential includes but is not limited to: Infectious diarrhea, C. difficile, diverticulitis, colitis or gastroenteritis  Lab work obtained, resulted notable for CMP with electrolytes grossly within normal limits.  Calcium low at 6.6, likely due to recent parathyroidectomy. Creatinine elevated, consistent  with ESRD.  AST elevated at 49, ALT within normal limits.  No anion gap.  CBC with leukocytosis 19.3, hemoglobin 9, and likely consistent with ESRD.  Magnesium elevated 3.3.  Lipase normal.  C. difficile negative.  GI panel pending.  CT abdomen/pelvis was pursued, notable for suggested "changes of fecal impaction and/or stercoral proctitis."  However, rectal exam not consistent with impaction, soft stool in the vault and leaking of stool.  Interventions include IV cefepime, IV Flagyl for potential infectious colitis in the setting of leukocytosis and diarrhea.  We  will admit the patient to hospitalist service for IV antibiotics, observation, rehydration, hemodialysis.  The plan for this patient was discussed with Dr. Darl Householder, who voiced agreement and who oversaw evaluation and treatment of this patient.    Final Clinical Impression(s) / ED Diagnoses Final diagnoses:  Diarrhea of presumed infectious origin    Rx / DC Orders ED Discharge Orders     None         Faylene Million, MD 02/08/22 2342    Drenda Freeze, MD 02/09/22 [phone]

## 2022-02-08 NOTE — H&P (Signed)
History and Physical    Mallory Chavez RJJ:[id number] DOB: 07/15/1935 DOA: 02/08/2022  PCP: [name], MD   Patient coming from: Home   Chief Complaint: Diarrhea   HPI: Mallory Chavez is a pleasant 86 y.o. female with medical history significant for COPD, chronic hypoxic respiratory failure, ESRD on hemodialysis, parathyroidectomy 2 weeks ago, and recent constipation, now presenting to the emergency department with liquid stool and fecal incontinence.  Patient has been taking oxycodone since her recent surgery, has also been on phosphate binder, developed constipation, attempted to treat this with a suppository, and now for the past 2 days has had fecal incontinence with liquid stool.  She denies any abdominal or rectal pain, denies fevers or chills, and denies any chest pain, lightheadedness, or cough.  She denies melena or hematochezia.  ED Course: Upon arrival to the ED, patient is found to be afebrile and saturating well on her home O2.  Leukocytosis to 19,300 was noted.  CT of the abdomen and pelvis demonstrates large volume of stool in the distal colon and rectal vault with perirectal inflammatory stranding and rectal wall thickening.  Manual disimpaction was attempted by the ED resident but unsuccessful.  Review of Systems:  All other systems reviewed and apart from HPI, are negative.  Past Medical History:  Diagnosis Date   Allergy    Arthritis    Asthma    CHF (congestive heart failure) ([place]) 1980   controlled with meds   Chronic respiratory failure (HCC)    COPD (chronic obstructive pulmonary disease) ([place])    ESRD on hemodialysis ([place])    MWF at [facility]   GERD (gastroesophageal reflux disease)    Hyperglycemia    Hyperlipidemia    Hypertension    Hyponatremia     Past Surgical History:  Procedure Laterality Date   AV FISTULA PLACEMENT Left 07/23/2018   Procedure: ARTERIOVENOUS (AV) FISTULA CREATION;  Surgeon: Waynetta Sandy, MD;  Location: [place];   Service: Vascular;  Laterality: Left;   [place] Right 07/23/2018   Procedure: INSERTION OF DIALYSIS CATHETER;  Surgeon: Waynetta Sandy, MD;  Location: [place];  Service: Vascular;  Laterality: Right;   PARATHYROID EXPLORATION N/A 01/25/2022   Procedure: NECK EXPLORATION WITH PARATHYROIDECTOMY;  Surgeon: Armandina Gemma, MD;  Location: [place];  Service: General;  Laterality: N/A;   TUMOR REMOVAL     lower back/benign and has come back   VAGINAL HYSTERECTOMY      Social History:   reports that she quit smoking about 29 years ago. Her smoking use included cigarettes. She has a 30.00 pack-year smoking history. She has never used smokeless tobacco. She reports that she does not drink alcohol and does not use drugs.  No Known Allergies  Family History  Problem Relation Age of Onset   Diabetes Mother    Emphysema Father      Prior to Admission medications   Medication Sig Start Date End Date Taking? Authorizing Provider  acetaminophen (TYLENOL) 500 MG tablet Take 500 mg by mouth daily as needed for mild pain or moderate pain.   Yes [provider]  aspirin EC 81 MG tablet Take 81 mg by mouth daily after supper.   Yes [provider]  B Complex-C-Zn-Folic Acid (DIALYVITE 016 WITH ZINC) 0.8 MG TABS Take 1 tablet by mouth every evening. 05/03/20  Yes [provider]  budesonide-formoterol (SYMBICORT) 160-4.5 MCG/ACT inhaler Inhale 2 puffs into the lungs  2 (two) times daily. 10/20/21  Yes Tanda Rockers, MD  calcitRIOL (ROCALTROL) 0.5 MCG capsule Take 5 capsule by mouth once a day 01/31/22  Yes   colchicine 0.6 MG tablet Take 1 tablet by mouth once daily as needed Patient taking differently: Take 0.6 mg by mouth daily as needed (Gout). 10/03/20  Yes [name], MD  Glycerin-Hypromellose-PEG 400 (DRY EYE RELIEF DROPS OP) Place 1 drop into both eyes at bedtime.   Yes [provider]   lidocaine-prilocaine (EMLA) cream Apply  a small amount to skin as directed to AVF- prior to dialysis 12/22/21  Yes   loratadine (CLARITIN) 10 MG tablet Take 10 mg by mouth daily after supper.   Yes [provider]  oxyCODONE (OXY IR/ROXICODONE) 5 MG immediate release tablet Take 1 tablet (5 mg total) by mouth every 6 (six) hours as needed for moderate pain. 01/31/22  Yes Armandina Gemma, MD  sevelamer carbonate (RENVELA) 800 MG tablet Take 1,600 mg by mouth 3 (three) times daily. 12/06/21  Yes [provider]  albuterol (VENTOLIN HFA) 108 (90 Base) MCG/ACT inhaler Inhale 2 puffs by mouth into the lungs every 4 hours as needed for wheezing or shortness of breath Patient not taking: Reported on 02/08/2022 10/03/20   Tanda Rockers, MD  calcium carbonate (OS-CAL - DOSED IN MG OF ELEMENTAL CALCIUM) 1250 (500 Ca) MG tablet Take 4 tablets by mouth every 6 hours (do not take with food) Patient not taking: Reported on 02/08/2022 01/31/22     cyclobenzaprine (FLEXERIL) 5 MG tablet Take 1 tablet by mouth at bedtime Patient not taking: Reported on 02/08/2022 05/23/21   [name], MD  famotidine (PEPCID) 40 MG tablet Take 1 tablet by mouth once daily Patient not taking: Reported on 02/08/2022 05/23/21   [name], MD  gabapentin (NEURONTIN) 100 MG capsule Take 1 capsule (100 mg total) by mouth at bedtime. Patient not taking: Reported on 02/08/2022 08/25/21   Edrick Kins, DPM  lidocaine-prilocaine (EMLA) cream Apply a small amount to skin as directed Apply to AVF- prior to dialysis Patient not taking: Reported on 02/08/2022 08/28/21     OXYGEN Inhale 1 L into the lungs See admin instructions. with sleep and exertion as needed [facility]    [provider]    Physical Exam: Vitals:   02/08/22 1859 02/08/22 2045 02/08/22 2215 02/08/22 2320  BP:  (!) 102/58 (!) 128/54   Pulse:  78 78   Resp:  18 15   Temp: 99.2 F (37.3 C)   99 F (37.2 C)  TempSrc: Oral   Oral  SpO2:  97% 97%    Weight: 64.9 kg     Height: '5\' 4"'$  (1.626 m)       Constitutional: NAD, calm  Eyes: PERTLA, lids and conjunctivae normal ENMT: Mucous membranes are moist. Posterior pharynx clear of any exudate or lesions.   Neck: supple, no masses  Respiratory:  no wheezing, no crackles. No accessory muscle use.  Cardiovascular: S1 & S2 heard, regular rate and rhythm. No extremity edema.  Abdomen: No distension, no tenderness, soft. Bowel sounds active.  Musculoskeletal: no clubbing / cyanosis. No joint deformity upper and lower extremities.   Skin: no significant rashes, lesions, ulcers. Warm, dry, well-perfused. Neurologic: CN 2-12 grossly intact. Moving all extremities. Alert and oriented.  Psychiatric: Very pleasant. Cooperative.    Labs and Imaging on Admission: I have personally reviewed following labs and imaging studies  CBC: Recent Labs  Lab 02/08/22 2000 02/08/22  2010  WBC 19.3*  --   NEUTROABS 16.2*  --   HGB 9.0* 9.2*  HCT 27.9* 27.0*  MCV 104.1*  --   PLT 204  --    Basic Metabolic Panel: Recent Labs  Lab 02/08/22 2000 02/08/22 2010  NA 136 133*  K 4.0 4.0  CL 94* 96*  CO2 28  --   GLUCOSE 135* 129*  BUN 23 25*  CREATININE 6.71* 7.10*  CALCIUM 6.6*  --   MG 3.3*  --    GFR: Estimated Creatinine Clearance: 5.2 mL/min (A) (by C-G formula based on SCr of 7.1 mg/dL (H)). Liver Function Tests: Recent Labs  Lab 02/08/22 2000  AST 49*  ALT 15  ALKPHOS 530*  BILITOT 0.5  PROT 6.9  ALBUMIN 3.1*   Recent Labs  Lab 02/08/22 2000  LIPASE 30   No results for input(s): "AMMONIA" in the last 168 hours. Coagulation Profile: No results for input(s): "INR", "PROTIME" in the last 168 hours. Cardiac Enzymes: No results for input(s): "CKTOTAL", "CKMB", "CKMBINDEX", "TROPONINI" in the last 168 hours. BNP (last 3 results) No results for input(s): "PROBNP" in the last 8760 hours. HbA1C: No results for input(s): "HGBA1C" in the last 72 hours. CBG: No results for  input(s): "GLUCAP" in the last 168 hours. Lipid Profile: No results for input(s): "CHOL", "HDL", "LDLCALC", "TRIG", "CHOLHDL", "LDLDIRECT" in the last 72 hours. Thyroid Function Tests: No results for input(s): "TSH", "T4TOTAL", "FREET4", "T3FREE", "THYROIDAB" in the last 72 hours. Anemia Panel: No results for input(s): "VITAMINB12", "FOLATE", "FERRITIN", "TIBC", "IRON", "RETICCTPCT" in the last 72 hours. Urine analysis:    Component Value Date/Time   COLORURINE YELLOW 07/02/2016 1058   APPEARANCEUR CLEAR 07/02/2016 1058   LABSPEC 1.010 07/02/2016 1058   PHURINE 6.0 07/02/2016 1058   GLUCOSEU NEGATIVE 07/02/2016 1058   HGBUR NEGATIVE 07/02/2016 1058   BILIRUBINUR NEGATIVE 07/02/2016 1058   KETONESUR NEGATIVE 07/02/2016 1058   PROTEINUR 30 (A) 07/02/2016 1058   NITRITE NEGATIVE 07/02/2016 1058   LEUKOCYTESUR SMALL (A) 07/02/2016 1058   Sepsis Labs: '@LABRCNTIP'$ (procalcitonin:4,lacticidven:4) ) Recent Results (from the past 240 hour(s))  C Difficile Quick Screen w PCR reflex     Status: None   Collection Time: 02/08/22  7:25 PM   Specimen: STOOL  Result Value Ref Range Status   C Diff antigen NEGATIVE NEGATIVE Final   C Diff toxin NEGATIVE NEGATIVE Final   C Diff interpretation No C. difficile detected.  Final    Comment: Performed at [facility], [place] [address], [place], [place] [address]     Radiological Exams on Admission: CT ABDOMEN PELVIS WO CONTRAST  Result Date: 02/08/2022 CLINICAL DATA:  Abdominal pain, acute, nonlocalized. EXAM: CT ABDOMEN AND PELVIS WITHOUT CONTRAST TECHNIQUE: Multidetector CT imaging of the abdomen and pelvis was performed following the standard protocol without IV contrast. RADIATION DOSE REDUCTION: This exam was performed according to the departmental dose-optimization program which includes automated exposure control, adjustment of the mA and/or kV according to patient size and/or use of iterative reconstruction technique. COMPARISON:  None  Available. FINDINGS: Lower chest: Mild cardiomegaly with enlargement of the right ventricle and right atrium. At least moderate multi-vessel coronary artery calcification. No pericardial effusion. Visualized lung bases are clear. Hepatobiliary: No focal liver abnormality is seen. No gallstones, gallbladder wall thickening, or biliary dilatation. Pancreas: Unremarkable Spleen: Unremarkable Adrenals/Urinary Tract: The adrenal glands are unremarkable. The kidneys are markedly atrophic bilaterally. Multiple exophytic simple cortical cysts are seen bilaterally in keeping with cystic change  of chronic renal failure. There are, however, 2 relatively hyperdense cortical lesions arising from the upper and lower pole of the left kidney measuring up to 62 Hounsfield units in density measuring up to 21 mm which are indeterminate. These may represent hyperdense renal cysts or solid renal masses. A similar 14 mm lesion is seen within the interpolar region of the right kidney. No hydronephrosis. 2-3 mm nonobstructing calculi are seen within the kidneys bilaterally. No ureteral calculi. The bladder is unremarkable. Stomach/Bowel: There is large volume stool seen within the distal colon and rectal vault with perirectal inflammatory stranding and rectal wall thickening suggesting changes of fecal impaction and/or stercoral proctitis. Superimposed moderate pancolonic diverticulosis without superimposed acute inflammatory change. The stomach, small bowel, and large bowel are otherwise unremarkable. Appendix normal. No free intraperitoneal gas or fluid. Vascular/Lymphatic: Extensive aortoiliac atherosclerotic calcification. No aortic aneurysm. No pathologic adenopathy within the abdomen and pelvis. Reproductive: Status post hysterectomy. Stable 2.8 cm right adnexal cystic lesion. While not optimally characterized on this examination, its stability over time is compatible with a benign etiology and further follow-up is not required.  Other: No abdominal wall hernia. Musculoskeletal: No acute bone abnormality. Diffuse sclerosis of the osseous structures is in keeping with changes of renal osteodystrophy. 3.4 cm intramuscular lipoma within the left gluteus maximus musculature. IMPRESSION: 1. Large volume stool within the distal colon and rectal vault with perirectal inflammatory stranding and rectal wall thickening suggesting changes of fecal impaction and/or stercoral proctitis. 2. Moderate pancolonic diverticulosis without superimposed acute inflammatory change. 3. Markedly atrophic kidneys bilaterally with multiple exophytic simple cortical cysts in keeping with cystic change of chronic renal failure. Superimposed bilateral hyperdense cortical lesions which may represent hyperdense renal cysts or solid renal masses. This would be better assessed with dedicated contrast enhanced CT or MRI examination, if indicated. 4. Mild cardiomegaly with enlargement of the right ventricle and right atrium. At least moderate multi-vessel coronary artery calcification. 5. Diffuse sclerosis of the osseous structures in keeping with changes of renal osteodystrophy. Aortic Atherosclerosis (ICD10-I70.0). Electronically Signed   By: Fidela Salisbury M.D.   On: 02/08/2022 21:07    Assessment/Plan   1. Fecal impaction; overflow diarrhea  - Presents with 2 days of fecal incontinence and liquid stool after a few days of constipation and found on CT to have fecal impaction with stercoral colitis  - Constipation likely related to recent oxycodone use, phosphate binder  - She has leukocytosis but no other SIRS criteria and no pain  - Manual disimpaction was unsuccessful in ED  - Plan to give enema, start PO lactulose   2. ESRD  - Pt reports completing HD on 02/07/22  - No indication for urgent dialysis on admission  - Restrict fluids, renally-dose medications, discuss with nephrology in am    3. COPD; chronic hypoxic respiratory failure  - Not in exacerbation  on admission  - Continue ICS/LABA and supplemental O2   4. Hypocalcemia  - Calcium 6.6 on admission, she is asymptomatic  - S/p parathyroidectomy on 01/25/22, calcium has been managed by nephrology   - She has been taking calcitriol as prescribed but not calcium carbonate, will resume now    DVT prophylaxis: sq heparin  Code Status: Full  Level of Care: Level of care: Telemetry Medical Family Communication: none present  Disposition Plan:  Patient is from: home  Anticipated d/c is to: Home  Anticipated d/c date is: 8/11 or 02/10/22  Patient currently: Pending treatment of fecal impaction, repeat labs, will likely need  HD prior to d/c  Consults called: none  Admission status: Observation     Vianne Bulls, MD [facility]  02/09/2022, 12:19 AM

## 2022-02-09 ENCOUNTER — Other Ambulatory Visit: Payer: Self-pay

## 2022-02-09 DIAGNOSIS — K5641 Fecal impaction: Secondary | ICD-10-CM | POA: Diagnosis not present

## 2022-02-09 LAB — GASTROINTESTINAL PANEL BY PCR, STOOL (REPLACES STOOL CULTURE)

## 2022-02-09 LAB — COMPREHENSIVE METABOLIC PANEL
ALT: 16 U/L (ref 0–44)
AST: 45 U/L — ABNORMAL HIGH (ref 15–41)
Albumin: 2.9 g/dL — ABNORMAL LOW (ref 3.5–5.0)
Alkaline Phosphatase: 486 U/L — ABNORMAL HIGH (ref 38–126)
Anion gap: 11 (ref 5–15)
BUN: 26 mg/dL — ABNORMAL HIGH (ref 8–23)
CO2: 28 mmol/L (ref 22–32)
Calcium: 6.2 mg/dL — CL (ref 8.9–10.3)
Chloride: 96 mmol/L — ABNORMAL LOW (ref 98–111)
Creatinine, Ser: 7.27 mg/dL — ABNORMAL HIGH (ref 0.44–1.00)
GFR, Estimated: 5 mL/min — ABNORMAL LOW (ref >60)
Glucose, Bld: 115 mg/dL — ABNORMAL HIGH (ref 70–99)
Potassium: 3.3 mmol/L — ABNORMAL LOW (ref 3.5–5.1)
Sodium: 135 mmol/L (ref 135–145)
Total Bilirubin: 0.5 mg/dL (ref 0.3–1.2)
Total Protein: 6.6 g/dL (ref 6.5–8.1)

## 2022-02-09 LAB — CBC
HCT: 27.9 % — ABNORMAL LOW (ref 36.0–46.0)
Hemoglobin: 8.8 g/dL — ABNORMAL LOW (ref 12.0–15.0)
MCH: 32.7 pg (ref 26.0–34.0)
MCHC: 31.5 g/dL (ref 30.0–36.0)
MCV: 103.7 fL — ABNORMAL HIGH (ref 80.0–100.0)
Platelets: 217 10*3/uL (ref 150–400)
RBC: 2.69 MIL/uL — ABNORMAL LOW (ref 3.87–5.11)
RDW: 15.8 % — ABNORMAL HIGH (ref 11.5–15.5)
WBC: 14.7 10*3/uL — ABNORMAL HIGH (ref 4.0–10.5)
nRBC: 0.2 % (ref 0.0–0.2)

## 2022-02-09 LAB — PHOSPHORUS: Phosphorus: 1.6 mg/dL — ABNORMAL LOW (ref 2.5–4.6)

## 2022-02-09 MED ORDER — SEVELAMER CARBONATE 800 MG PO TABS
1600.0000 mg | ORAL_TABLET | Freq: Three times a day (TID) | ORAL | Status: DC
  Administered 2022-02-09: 1600 mg via ORAL
  Filled 2022-02-09 (×2): qty 2

## 2022-02-09 MED ORDER — SODIUM CHLORIDE 0.9 % IV SOLN
4.0000 g | Freq: Once | INTRAVENOUS | Status: AC
  Administered 2022-02-09: 4 g via INTRAVENOUS
  Filled 2022-02-09: qty 40

## 2022-02-09 MED ORDER — CALCIUM CARBONATE 1250 (500 CA) MG PO TABS
4.0000 | ORAL_TABLET | Freq: Three times a day (TID) | ORAL | Status: DC
  Administered 2022-02-09 – 2022-02-13 (×10): 5000 mg via ORAL
  Filled 2022-02-09 (×11): qty 4

## 2022-02-09 MED ORDER — SODIUM PHOSPHATES 45 MMOLE/15ML IV SOLN
30.0000 mmol | Freq: Once | INTRAVENOUS | Status: AC
  Administered 2022-02-10: 30 mmol via INTRAVENOUS
  Filled 2022-02-09: qty 10

## 2022-02-09 MED ORDER — OXYCODONE HCL 5 MG PO TABS
5.0000 mg | ORAL_TABLET | Freq: Four times a day (QID) | ORAL | Status: DC | PRN
  Administered 2022-02-09 – 2022-02-10 (×3): 5 mg via ORAL
  Filled 2022-02-09 (×4): qty 1

## 2022-02-09 MED ORDER — ASPIRIN 81 MG PO TBEC
81.0000 mg | DELAYED_RELEASE_TABLET | Freq: Every day | ORAL | Status: DC
  Administered 2022-02-09 – 2022-02-15 (×7): 81 mg via ORAL
  Filled 2022-02-09 (×7): qty 1

## 2022-02-09 MED ORDER — CALCITRIOL 0.5 MCG PO CAPS
0.5000 ug | ORAL_CAPSULE | Freq: Every day | ORAL | Status: DC
  Administered 2022-02-09: 0.5 ug via ORAL
  Filled 2022-02-09: qty 1

## 2022-02-09 MED ORDER — CALCIUM CARBONATE 1250 (500 CA) MG PO TABS
4.0000 | ORAL_TABLET | Freq: Four times a day (QID) | ORAL | Status: DC
Start: 2022-02-09 — End: 2022-02-09

## 2022-02-09 MED ORDER — MOMETASONE FURO-FORMOTEROL FUM 200-5 MCG/ACT IN AERO
2.0000 | INHALATION_SPRAY | Freq: Two times a day (BID) | RESPIRATORY_TRACT | Status: DC
  Administered 2022-02-09 – 2022-02-16 (×13): 2 via RESPIRATORY_TRACT
  Filled 2022-02-09: qty 8.8

## 2022-02-09 MED ORDER — CALCITRIOL 0.5 MCG PO CAPS
2.0000 ug | ORAL_CAPSULE | ORAL | Status: DC
  Administered 2022-02-12 – 2022-02-16 (×3): 2 ug via ORAL
  Filled 2022-02-09 (×5): qty 4

## 2022-02-09 MED ORDER — POLYVINYL ALCOHOL 1.4 % OP SOLN
1.0000 [drp] | Freq: Every day | OPHTHALMIC | Status: DC
  Administered 2022-02-09 – 2022-02-15 (×7): 1 [drp] via OPHTHALMIC
  Filled 2022-02-09: qty 15

## 2022-02-09 MED ORDER — CHLORHEXIDINE GLUCONATE CLOTH 2 % EX PADS
6.0000 | MEDICATED_PAD | Freq: Every day | CUTANEOUS | Status: DC
  Administered 2022-02-10 – 2022-02-16 (×4): 6 via TOPICAL

## 2022-02-09 NOTE — Progress Notes (Signed)
Pt receives out-pt HD at [facility] on MWF. Pt arrives at 10:30 for 10:50 chair time. Will assist as needed.   Melven Sartorius Renal Navigator [phone]

## 2022-02-09 NOTE — Consult Note (Addendum)
Renal Service Consult Note [facility]  Mallory Chavez 02/09/2022 [name], MD Requesting Physician: Dr. Rodena Piety  Reason for Consult: ESRD pt w/ acute diarrhea episode HPI: The patient is a 86 y.o. year-old w/ hx of COPD, HTN, CHF, ESRD on HD, sp recent parathyroidectomy who presented w/ c/o diarrhea for last 2 days. She was constipated prior to that and took a suppository then has had diarrhea w/ cramping since. After parathyroidectomy pt had been taking oxycodone for pain, also on a phosphate binder. Pt is MWF dialysis. In ED WBC 19k, K+ 3.3, creat 7.2, BUN 26. CT of the abdomen and pelvis demonstrates large volume of stool in the distal colon and rectal vault with perirectal inflammatory stranding and rectal wall thickening. Pt rec'd IV abx, docusate mini-enema, lactulose po and was admitted. We are asked to see for dialysis.   Pt seen in ED, had large BM overnight after laxatives and feels much better. Ca ++ here is low in 6-7 range.  She states she was dc'd after parathyroid surgery on calcium pills to take 4 pills once a day. She is getting vdra at HD 2.0 ug tiw. Looking at her parathyroidectomy admission, her calcitriol dosing was from 0.5- 3.0 ug per day and dc dose was 2.5 ug daily. Her CaCO3 dosing was started at 3 gm (all elemental) per day, then gradually increased to peak 12 gm / day, then needed to be lowered and she was dc'd on 8 gm / day (=or 2 gm elemental qid). She denies any paresthesias or muscle twitching.    ROS - denies CP, no joint pain, no HA, no blurry vision, no rash, no diarrhea, no nausea/ vomiting, no dysuria, no difficulty voiding   Past Medical History  Past Medical History:  Diagnosis Date   Allergy    Arthritis    Asthma    CHF (congestive heart failure) ([place]) 1980   controlled with meds   Chronic respiratory failure (HCC)    COPD (chronic obstructive pulmonary disease) ([place])    ESRD on hemodialysis ([place])    MWF at [facility]   GERD  (gastroesophageal reflux disease)    Hyperglycemia    Hyperlipidemia    Hypertension    Hyponatremia    Past Surgical History  Past Surgical History:  Procedure Laterality Date   AV FISTULA PLACEMENT Left 07/23/2018   Procedure: ARTERIOVENOUS (AV) FISTULA CREATION;  Surgeon: Waynetta Sandy, MD;  Location: [place];  Service: Vascular;  Laterality: Left;   [place] Right 07/23/2018   Procedure: INSERTION OF DIALYSIS CATHETER;  Surgeon: Waynetta Sandy, MD;  [facility]: [place] [facility];  Service: Vascular;  Laterality: Right;   PARATHYROID EXPLORATION N/A 01/25/2022   Procedure: NECK EXPLORATION WITH PARATHYROIDECTOMY;  Surgeon: Armandina Gemma, MD;  [facility];  Service: General;  Laterality: N/A;   TUMOR REMOVAL     lower back/benign and has come back   VAGINAL HYSTERECTOMY     Family History  Family History  Problem Relation Age of Onset   Diabetes Mother    Emphysema Father    Social History  reports that she quit smoking about 29 years ago. Her smoking use included cigarettes. She has a 30.00 pack-year smoking history. She has never used smokeless tobacco. She reports that she does not drink alcohol and does not use drugs. Allergies No Known Allergies Home medications Prior to Admission medications   Medication Sig Start Date End Date  Taking? Authorizing Provider  acetaminophen (TYLENOL) 500 MG tablet Take 500 mg by mouth daily as needed for mild pain or moderate pain.   Yes [provider]  aspirin EC 81 MG tablet Take 81 mg by mouth daily after supper.   Yes [provider]  B Complex-C-Zn-Folic Acid (DIALYVITE 277 WITH ZINC) 0.8 MG TABS Take 1 tablet by mouth every evening. 05/03/20  Yes [provider]  budesonide-formoterol (SYMBICORT) 160-4.5 MCG/ACT inhaler Inhale 2 puffs into the lungs 2 (two) times daily. 10/20/21  Yes Tanda Rockers, MD  calcitRIOL (ROCALTROL) 0.5 MCG capsule Take 5  capsule by mouth once a day 01/31/22  Yes   colchicine 0.6 MG tablet Take 1 tablet by mouth once daily as needed Patient taking differently: Take 0.6 mg by mouth daily as needed (Gout). 10/03/20  Yes [name], MD  Glycerin-Hypromellose-PEG 400 (DRY EYE RELIEF DROPS OP) Place 1 drop into both eyes at bedtime.   Yes [provider]  lidocaine-prilocaine (EMLA) cream Apply  a small amount to skin as directed to AVF- prior to dialysis 12/22/21  Yes   loratadine (CLARITIN) 10 MG tablet Take 10 mg by mouth daily after supper.   Yes [provider]  oxyCODONE (OXY IR/ROXICODONE) 5 MG immediate release tablet Take 1 tablet (5 mg total) by mouth every 6 (six) hours as needed for moderate pain. 01/31/22  Yes Armandina Gemma, MD  sevelamer carbonate (RENVELA) 800 MG tablet Take 1,600 mg by mouth 3 (three) times daily. 12/06/21  Yes [provider]  albuterol (VENTOLIN HFA) 108 (90 Base) MCG/ACT inhaler Inhale 2 puffs by mouth into the lungs every 4 hours as needed for wheezing or shortness of breath Patient not taking: Reported on 02/08/2022 10/03/20   Tanda Rockers, MD  calcium carbonate (OS-CAL - DOSED IN MG OF ELEMENTAL CALCIUM) 1250 (500 Ca) MG tablet Take 4 tablets by mouth every 6 hours (do not take with food) Patient not taking: Reported on 02/08/2022 01/31/22     cyclobenzaprine (FLEXERIL) 5 MG tablet Take 1 tablet by mouth at bedtime Patient not taking: Reported on 02/08/2022 05/23/21   [name], MD  famotidine (PEPCID) 40 MG tablet Take 1 tablet by mouth once daily Patient not taking: Reported on 02/08/2022 05/23/21   [name], MD  gabapentin (NEURONTIN) 100 MG capsule Take 1 capsule (100 mg total) by mouth at bedtime. Patient not taking: Reported on 02/08/2022 08/25/21   Edrick Kins, DPM  lidocaine-prilocaine (EMLA) cream Apply a small amount to skin as directed Apply to AVF- prior to dialysis Patient not taking: Reported on 02/08/2022 08/28/21     OXYGEN Inhale 1 L into  the lungs See admin instructions. with sleep and exertion as needed [facility]    [provider]     Vitals:   02/09/22 0406 02/09/22 0630 02/09/22 0645 02/09/22 0700  BP:  (!) 112/59 114/68 (!) 119/56  Pulse:   74 71  Resp:  '12 16 17  '$ Temp: 98.9 F (37.2 C)     TempSrc:      SpO2:  98% 98% 98%  Weight:      Height:       Exam Gen alert, no distress No rash, cyanosis or gangrene Sclera anicteric, throat clear  No jvd or bruits Chest clear bilat to bases, no rales/ wheezing RRR no MRG Abd soft ntnd no mass or ascites +bs GU normal MS no joint effusions or deformity Ext no LE or UE edema, no  wounds or ulcers Neuro is alert, Ox 3 , nf, no facial twitching w/ tapping of the facial nerve    LUA AVF+bruit   Home meds include - aspirin, budesnoide-formoterol, colchicine, oxycodone, sevelamer carbonate 2 ac, albuterol, famotidine, gabapentin, O2 home, prns/ vits/ supps   CT abd 8/10 - IMPRESSION: 1. Large volume stool within the distal colon and rectal vault with perirectal inflammatory stranding and rectal wall thickening suggesting changes of fecal impaction and/or stercoral proctitis. 2. Moderate pancolonic diverticulosis without superimposed acute inflammatory change.    OP HD: MWF [facility] 3h 74mn  4090/1.5  64.8kg  2/2 bath  P2  Heparin 4000  LUA AVF - last hep B labs here: 01/25/22 - last HD 8/9 post wt 65.7kg - last Hb 9.9 on 8/9 - mircera 30 q 2, last 8/9 - iron sucrose 50 weekly, last 8/9 - calcitriol 2.0 mcg po tiw  Assessment/ Plan: Acute diarrhea - post laxatives for constipation. CT here showing constipation, got enema and lactulose yesterday. Per pmd.  Hypocalcemia - Ca++ 6-7 range here which is low, fortunately is not symptomatic. SP recent parathyroidectomy. Pt states she was taking "4 calcium pills once a day", when it should have been 4 tablets 4 times per day (= 8 gm /day). Will drop dose slightly to 6 gm per day (= tums '1250mg'$  = '500mg'$  elemental x 4 tid =  '6000mg'$  per day). Take away from meals. Will give 4 gm IV calc gluconate x 1 as well. Will continue calcitriol at HD tiw (2.0 ug) and dc daily home dose at 0.5 ug. Will do bid renal function panel.  Hypophosphatemia - phos < 2, will dc binder and will give IV Na phos, get repeat labs in am ESRD - on HD MWF. Plan HD today.  Volume- does not appear volume overloaded, at dry wt. Small UF goal.  HTN - BP's low normal.  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 Holding binders. As above.  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 Get records    Rob SJonnie Finner MD 02/09/2022, 9:45 AM Recent Labs  Lab 02/08/22 2000 02/08/22 2010 02/09/22 0333  HGB 9.0* 9.2* 8.8*  ALBUMIN 3.1*  --  2.9*  CALCIUM 6.6*  --  6.2*  PHOS  --   --  1.6*  CREATININE 6.71* 7.10* 7.27*  K 4.0 4.0 3.3*

## 2022-02-09 NOTE — Progress Notes (Signed)
Received patient in bed to unit.  Alert and oriented.  Informed consent signed and in  chart.   Treatment initiated:1307 Treatment completed: 4514  Patient tolerated well.  Transported back to the room  alert, without acute distress.  Hand-off given to patient's nurse.   Access used: (L) arm AVF Access issues: None  Total UF removed: 2.0L Medication(s) given: None Post HD VS: 103/62,75,13,98% Post HD weight: 69.9kg   [facility]

## 2022-02-09 NOTE — Progress Notes (Signed)
PROGRESS NOTE    Mallory Chavez  ZOX:[id number] DOB: 1936/05/10 DOA: 02/08/2022 PCP: [name], MD   Brief Narrative: 86 year old female mated with diarrhea after taking stool softeners and suppository at home for constipation.  Patient had parathyroidectomy 2 weeks ago and was taking oxycodone since her surgery and developed constipation.  She denies nausea vomiting abdominal pain fever chills.  She has a history significant for ESRD on dialysis Monday Wednesday Friday chronic O2 dependent hypoxic respiratory failure, COPD.  In the ED her white count was 19,000.  CT abdomen and pelvis shows large volume of stool in the distal colon and rectum involved with perirectal inflammatory stranding and rectal wall thickening.  Manual disimpaction was attempted at the ED without success.  Assessment & Plan:   Principal Problem:   Fecal impaction ([place]) Active Problems:   COPD GOLD II   Chronic respiratory failure with hypoxia ([facility])   ESRD on dialysis ([place])   Overflow diarrhea   Hypocalcemia   #1 Fecal impaction-patient had large BM in the ER. CT abdomen and pelvis shows fecal impaction with stercoral colitis. Her white count on admission 19.3 down to 14.7. Consult PT  #2 ESRD on dialysis Monday Wednesday Friday Appreciate nephrology input  #3 hypokalemia/hypocalcemia/hypophosphatemia-being repleted by renal.  #4 chronic hypoxic respiratory failure O2 dependent stable continue home meds   Estimated body mass index is 26.83 kg/m as calculated from the following:   Height as of this encounter: '5\' 4"'$  (1.626 m).   Weight as of this encounter: 70.9 kg.  DVT prophylaxis: heparin Code Status: Full code Family Communication: None at bedside  disposition Plan:  Status is: Observation The patient remains OBS appropriate and will d/c before 2 midnights.   Consultants:  Nephrology  Procedures: None Antimicrobials: None  Subjective: She feels better than yesterday denies any  twitching denies any nausea vomiting abdominal pain  Objective: Vitals:   02/09/22 1336 02/09/22 1400 02/09/22 1430 02/09/22 1500  BP: (!) 88/63 (!) 109/93 130/65 (!) 145/63  Pulse: 69 70 67 68  Resp: (!) '25 20 15 12  '$ Temp:      TempSrc:      SpO2: 100% 100% 96% 96%  Weight:      Height:       No intake or output data in the 24 hours ending 02/09/22 1528 Filed Weights   02/08/22 1859 02/09/22 1240  Weight: 64.9 kg 70.9 kg    Examination:  General exam: Appears in no acute distress Respiratory system: Clear to auscultation. Respiratory effort normal. Cardiovascular system: S1 & S2 heard, RRR. No JVD, murmurs, rubs, gallops or clicks. No pedal edema. Gastrointestinal system: Abdomen is distended, soft and nontender. No organomegaly or masses felt. Normal bowel sounds heard. Central nervous system: Alert and oriented. No focal neurological deficits. Extremities: Symmetric 5 x 5 power. Skin: No rashes, lesions or ulcers Psychiatry: Judgement and insight appear normal. Mood & affect appropriate.     Data Reviewed: I have personally reviewed following labs and imaging studies  CBC: Recent Labs  Lab 02/08/22 2000 02/08/22 2010 02/09/22 0333  WBC 19.3*  --  14.7*  NEUTROABS 16.2*  --   --   HGB 9.0* 9.2* 8.8*  HCT 27.9* 27.0* 27.9*  MCV 104.1*  --  103.7*  PLT 204  --  811   Basic Metabolic Panel: Recent Labs  Lab 02/08/22 2000 02/08/22 2010 02/09/22 0333  NA 136 133* 135  K 4.0 4.0 3.3*  CL 94* 96* 96*  CO2 28  --  28  GLUCOSE 135* 129* 115*  BUN 23 25* 26*  CREATININE 6.71* 7.10* 7.27*  CALCIUM 6.6*  --  6.2*  MG 3.3*  --   --   PHOS  --   --  1.6*   GFR: Estimated Creatinine Clearance: 5.7 mL/min (A) (by C-G formula based on SCr of 7.27 mg/dL (H)). Liver Function Tests: Recent Labs  Lab 02/08/22 2000 02/09/22 0333  AST 49* 45*  ALT 15 16  ALKPHOS 530* 486*  BILITOT 0.5 0.5  PROT 6.9 6.6  ALBUMIN 3.1* 2.9*   Recent Labs  Lab 02/08/22 2000   LIPASE 30   No results for input(s): "AMMONIA" in the last 168 hours. Coagulation Profile: No results for input(s): "INR", "PROTIME" in the last 168 hours. Cardiac Enzymes: No results for input(s): "CKTOTAL", "CKMB", "CKMBINDEX", "TROPONINI" in the last 168 hours. BNP (last 3 results) No results for input(s): "PROBNP" in the last 8760 hours. HbA1C: No results for input(s): "HGBA1C" in the last 72 hours. CBG: No results for input(s): "GLUCAP" in the last 168 hours. Lipid Profile: No results for input(s): "CHOL", "HDL", "LDLCALC", "TRIG", "CHOLHDL", "LDLDIRECT" in the last 72 hours. Thyroid Function Tests: No results for input(s): "TSH", "T4TOTAL", "FREET4", "T3FREE", "THYROIDAB" in the last 72 hours. Anemia Panel: No results for input(s): "VITAMINB12", "FOLATE", "FERRITIN", "TIBC", "IRON", "RETICCTPCT" in the last 72 hours. Sepsis Labs: No results for input(s): "PROCALCITON", "LATICACIDVEN" in the last 168 hours.  Recent Results (from the past 240 hour(s))  C Difficile Quick Screen w PCR reflex     Status: None   Collection Time: 02/08/22  7:25 PM   Specimen: STOOL  Result Value Ref Range Status   C Diff antigen NEGATIVE NEGATIVE Final   C Diff toxin NEGATIVE NEGATIVE Final   C Diff interpretation No C. difficile detected.  Final    Comment: Performed at [facility], [place] [address] [place], [place] [address]  Gastrointestinal Panel by PCR , Stool     Status: None   Collection Time: 02/08/22  7:25 PM   Specimen: STOOL  Result Value Ref Range Status   Campylobacter species NOT DETECTED NOT DETECTED Final   Plesimonas shigelloides NOT DETECTED NOT DETECTED Final   Salmonella species NOT DETECTED NOT DETECTED Final   Yersinia enterocolitica NOT DETECTED NOT DETECTED Final   Vibrio species NOT DETECTED NOT DETECTED Final   Vibrio cholerae NOT DETECTED NOT DETECTED Final   Enteroaggregative E coli (EAEC) NOT DETECTED NOT DETECTED Final   Enteropathogenic E coli (EPEC) NOT  DETECTED NOT DETECTED Final   Enterotoxigenic E coli (ETEC) NOT DETECTED NOT DETECTED Final   Shiga like toxin producing E coli (STEC) NOT DETECTED NOT DETECTED Final   Shigella/Enteroinvasive E coli (EIEC) NOT DETECTED NOT DETECTED Final   Cryptosporidium NOT DETECTED NOT DETECTED Final   Cyclospora cayetanensis NOT DETECTED NOT DETECTED Final   Entamoeba histolytica NOT DETECTED NOT DETECTED Final   Giardia lamblia NOT DETECTED NOT DETECTED Final   Adenovirus F40/41 NOT DETECTED NOT DETECTED Final   Astrovirus NOT DETECTED NOT DETECTED Final   Norovirus GI/GII NOT DETECTED NOT DETECTED Final   Rotavirus A NOT DETECTED NOT DETECTED Final   Sapovirus (I, II, IV, and V) NOT DETECTED NOT DETECTED Final    Comment: Performed at [facility], [address], [place], [place] [address]         Radiology Studies: CT ABDOMEN PELVIS WO CONTRAST  Result Date: 02/08/2022 CLINICAL DATA:  Abdominal pain, acute, nonlocalized.  EXAM: CT ABDOMEN AND PELVIS WITHOUT CONTRAST TECHNIQUE: Multidetector CT imaging of the abdomen and pelvis was performed following the standard protocol without IV contrast. RADIATION DOSE REDUCTION: This exam was performed according to the departmental dose-optimization program which includes automated exposure control, adjustment of the mA and/or kV according to patient size and/or use of iterative reconstruction technique. COMPARISON:  None Available. FINDINGS: Lower chest: Mild cardiomegaly with enlargement of the right ventricle and right atrium. At least moderate multi-vessel coronary artery calcification. No pericardial effusion. Visualized lung bases are clear. Hepatobiliary: No focal liver abnormality is seen. No gallstones, gallbladder wall thickening, or biliary dilatation. Pancreas: Unremarkable Spleen: Unremarkable Adrenals/Urinary Tract: The adrenal glands are unremarkable. The kidneys are markedly atrophic bilaterally. Multiple exophytic simple cortical cysts  are seen bilaterally in keeping with cystic change of chronic renal failure. There are, however, 2 relatively hyperdense cortical lesions arising from the upper and lower pole of the left kidney measuring up to 62 Hounsfield units in density measuring up to 21 mm which are indeterminate. These may represent hyperdense renal cysts or solid renal masses. A similar 14 mm lesion is seen within the interpolar region of the right kidney. No hydronephrosis. 2-3 mm nonobstructing calculi are seen within the kidneys bilaterally. No ureteral calculi. The bladder is unremarkable. Stomach/Bowel: There is large volume stool seen within the distal colon and rectal vault with perirectal inflammatory stranding and rectal wall thickening suggesting changes of fecal impaction and/or stercoral proctitis. Superimposed moderate pancolonic diverticulosis without superimposed acute inflammatory change. The stomach, small bowel, and large bowel are otherwise unremarkable. Appendix normal. No free intraperitoneal gas or fluid. Vascular/Lymphatic: Extensive aortoiliac atherosclerotic calcification. No aortic aneurysm. No pathologic adenopathy within the abdomen and pelvis. Reproductive: Status post hysterectomy. Stable 2.8 cm right adnexal cystic lesion. While not optimally characterized on this examination, its stability over time is compatible with a benign etiology and further follow-up is not required. Other: No abdominal wall hernia. Musculoskeletal: No acute bone abnormality. Diffuse sclerosis of the osseous structures is in keeping with changes of renal osteodystrophy. 3.4 cm intramuscular lipoma within the left gluteus maximus musculature. IMPRESSION: 1. Large volume stool within the distal colon and rectal vault with perirectal inflammatory stranding and rectal wall thickening suggesting changes of fecal impaction and/or stercoral proctitis. 2. Moderate pancolonic diverticulosis without superimposed acute inflammatory change. 3.  Markedly atrophic kidneys bilaterally with multiple exophytic simple cortical cysts in keeping with cystic change of chronic renal failure. Superimposed bilateral hyperdense cortical lesions which may represent hyperdense renal cysts or solid renal masses. This would be better assessed with dedicated contrast enhanced CT or MRI examination, if indicated. 4. Mild cardiomegaly with enlargement of the right ventricle and right atrium. At least moderate multi-vessel coronary artery calcification. 5. Diffuse sclerosis of the osseous structures in keeping with changes of renal osteodystrophy. Aortic Atherosclerosis (ICD10-I70.0). Electronically Signed   By: Fidela Salisbury M.D.   On: 02/08/2022 21:07        Scheduled Meds:  aspirin EC  81 mg Oral QPC supper   calcitRIOL  2 mcg Oral Q M,W,F   calcium carbonate  4 tablet Oral TID BM   Chlorhexidine Gluconate Cloth  6 each Topical Q0600   heparin  5,000 Units Subcutaneous Q8H   lactulose  20 g Oral Daily   mometasone-formoterol  2 puff Inhalation BID   polyvinyl alcohol  1 drop Both Eyes QHS   sodium chloride flush  3 mL Intravenous Q12H   Continuous Infusions:  calcium gluconate  sodium phosphate 30 mmol in dextrose 5 % 250 mL infusion       LOS: 0 days    Time spent: 40 MIN  Georgette Shell, MD 02/09/2022, 3:28 PM

## 2022-02-09 NOTE — Procedures (Signed)
   I was present at this dialysis session, have reviewed the session itself and made  appropriate changes Kelly Splinter MD [place] pager [phone]   02/09/2022, 2:37 PM

## 2022-02-09 NOTE — ED Notes (Signed)
Informed consent completed at this time for dialysis on paper form. Sent with pt to dialysis.

## 2022-02-10 DIAGNOSIS — K5641 Fecal impaction: Secondary | ICD-10-CM | POA: Diagnosis not present

## 2022-02-10 LAB — COMPREHENSIVE METABOLIC PANEL
ALT: 15 U/L (ref 0–44)
AST: 43 U/L — ABNORMAL HIGH (ref 15–41)
Albumin: 2.7 g/dL — ABNORMAL LOW (ref 3.5–5.0)
Alkaline Phosphatase: 429 U/L — ABNORMAL HIGH (ref 38–126)
Anion gap: 16 — ABNORMAL HIGH (ref 5–15)
BUN: 16 mg/dL (ref 8–23)
CO2: 26 mmol/L (ref 22–32)
Calcium: 6.5 mg/dL — ABNORMAL LOW (ref 8.9–10.3)
Chloride: 90 mmol/L — ABNORMAL LOW (ref 98–111)
Creatinine, Ser: 5.3 mg/dL — ABNORMAL HIGH (ref 0.44–1.00)
GFR, Estimated: 8 mL/min — ABNORMAL LOW (ref >60)
Glucose, Bld: 140 mg/dL — ABNORMAL HIGH (ref 70–99)
Potassium: 2.9 mmol/L — ABNORMAL LOW (ref 3.5–5.1)
Sodium: 132 mmol/L — ABNORMAL LOW (ref 135–145)
Total Bilirubin: 0.4 mg/dL (ref 0.3–1.2)
Total Protein: 5.9 g/dL — ABNORMAL LOW (ref 6.5–8.1)

## 2022-02-10 LAB — RENAL FUNCTION PANEL
Albumin: 2.7 g/dL — ABNORMAL LOW (ref 3.5–5.0)
Anion gap: 16 — ABNORMAL HIGH (ref 5–15)
BUN: 15 mg/dL (ref 8–23)
CO2: 28 mmol/L (ref 22–32)
Calcium: 6.7 mg/dL — ABNORMAL LOW (ref 8.9–10.3)
Chloride: 90 mmol/L — ABNORMAL LOW (ref 98–111)
Creatinine, Ser: 5.18 mg/dL — ABNORMAL HIGH (ref 0.44–1.00)
GFR, Estimated: 8 mL/min — ABNORMAL LOW (ref >60)
Glucose, Bld: 141 mg/dL — ABNORMAL HIGH (ref 70–99)
Phosphorus: 3.2 mg/dL (ref 2.5–4.6)
Potassium: 2.8 mmol/L — ABNORMAL LOW (ref 3.5–5.1)
Sodium: 134 mmol/L — ABNORMAL LOW (ref 135–145)

## 2022-02-10 LAB — PHOSPHORUS: Phosphorus: 4 mg/dL (ref 2.5–4.6)

## 2022-02-10 LAB — CBC
HCT: 31.3 % — ABNORMAL LOW (ref 36.0–46.0)
Hemoglobin: 10.5 g/dL — ABNORMAL LOW (ref 12.0–15.0)
MCH: 33.8 pg (ref 26.0–34.0)
MCHC: 33.5 g/dL (ref 30.0–36.0)
MCV: 100.6 fL — ABNORMAL HIGH (ref 80.0–100.0)
Platelets: 193 10*3/uL (ref 150–400)
RBC: 3.11 MIL/uL — ABNORMAL LOW (ref 3.87–5.11)
RDW: 15.8 % — ABNORMAL HIGH (ref 11.5–15.5)
WBC: 13.8 10*3/uL — ABNORMAL HIGH (ref 4.0–10.5)
nRBC: 0.2 % (ref 0.0–0.2)

## 2022-02-10 LAB — MAGNESIUM: Magnesium: 2.8 mg/dL — ABNORMAL HIGH (ref 1.7–2.4)

## 2022-02-10 MED ORDER — POTASSIUM CHLORIDE CRYS ER 20 MEQ PO TBCR
30.0000 meq | EXTENDED_RELEASE_TABLET | Freq: Three times a day (TID) | ORAL | Status: AC
Start: 2022-02-10 — End: 2022-02-10
  Administered 2022-02-10 (×2): 30 meq via ORAL
  Filled 2022-02-10 (×3): qty 1

## 2022-02-10 NOTE — Care Management Obs Status (Signed)
[place] NOTIFICATION   Patient Details  Name: Mallory Chavez MRN: [id number] Date of Birth: [birth year]   Medicare Observation Status Notification Given:  Yes    Bartholomew Crews, RN 02/10/2022, 11:02 AM

## 2022-02-10 NOTE — Evaluation (Addendum)
Physical Therapy Evaluation Patient Details Name: Mallory Chavez MRN: [id number] DOB: February 16, 1936 Today's Date: 02/10/2022  History of Present Illness  Mallory Chavez is a pleasant 86 y.o. female with medical history significant for COPD, chronic hypoxic respiratory failure, ESRD on hemodialysis, parathyroidectomy 2 weeks ago, and recent constipation, now presenting to the emergency department with liquid stool and fecal incontinence.  Patient has been taking oxycodone since her recent surgery, has also been on phosphate binder, developed constipation, attempted to treat this with a suppository, and now for the past 2 days has had fecal incontinence with liquid stool.  She denies any abdominal or rectal pain, denies fevers or chills, and denies any chest pain, lightheadedness, or cough.  She denies melena or hematochezia.  Clinical Impression  Pt agreeable to physical therapy evaluation. Pt's gait limited by rectal pain and pt only able to complete 25 feet with RW. Anticipate pt will progress well once pt more under control as she does not require assistance for bed mobility or transfers. Pt currently presents with functional limitations secondary to impairments listed in PT problem list. Pt to benefit from skilled, acute care physical therapy interventions to maximize her independence level and quality of life.       Recommendations for follow up therapy are one component of a multi-disciplinary discharge planning process, led by the attending physician.  Recommendations may be updated based on patient status, additional functional criteria and insurance authorization.  Follow Up Recommendations Home health PT (may progress to no PT follow up)      Assistance Recommended at Discharge Intermittent Supervision/Assistance  Patient can return home with the following  A little help with walking and/or transfers;A little help with bathing/dressing/bathroom;Assistance with cooking/housework;Assist  for transportation;Help with stairs or ramp for entrance    Equipment Recommendations None recommended by PT  Recommendations for Other Services       Functional Status Assessment Patient has had a recent decline in their functional status and demonstrates the ability to make significant improvements in function in a reasonable and predictable amount of time.     Precautions / Restrictions Precautions Precautions: Fall Restrictions Weight Bearing Restrictions: No      Mobility  Bed Mobility Overal bed mobility: Independent             General bed mobility comments: Pt sitting on BSC upon arrival (pt stated she did on her own)    Transfers Overall transfer level: Modified independent Equipment used: Rolling walker (2 wheels), None               General transfer comment: Pt performed transfer from bed to [facility] prior to session without AD.    Ambulation/Gait Ambulation/Gait assistance: Supervision, Min guard Gait Distance (Feet): 25 Feet Assistive device: Rolling walker (2 wheels) Gait Pattern/deviations: Step-through pattern, Trunk flexed, Antalgic Gait velocity: decreased Gait velocity interpretation: <1.31 ft/sec, indicative of household ambulator   General Gait Details: Pt with slow and antalgic gait. Pt required cues to maintain her COG more forwards inside of RW. No LOB occurred.  Stairs            Wheelchair Mobility    Modified Rankin (Stroke Patients Only)       Balance Overall balance assessment: Modified Independent                                           Pertinent  Vitals/Pain Pain Assessment Pain Assessment: 0-10 Pain Score: 10-Worst pain ever Pain Location: rectum Pain Descriptors / Indicators: Guarding, Grimacing Pain Intervention(s): Limited activity within patient's tolerance, Monitored during session, Patient requesting pain meds-RN notified    Home Living Family/patient expects to be discharged to::  Private residence Living Arrangements: Alone Available Help at Discharge: Family;Available PRN/intermittently (daughter) Type of Home: Apartment Home Access: Level entry       Home Layout: One level Home Equipment: Rollator (4 wheels);Shower seat;Shower seat - built in;Grab bars - tub/shower;Grab bars - toilet      Prior Function Prior Level of Function : Independent/Modified Independent (Does not drive)             Mobility Comments: Uses rollator for ambulation. ADLs Comments: Pt's daughter takes care of some of her errands. Pt's daughter also helps pt cook and clean. Pt independent with bathing and dressing.     Hand Dominance        Extremity/Trunk Assessment   Upper Extremity Assessment Upper Extremity Assessment: Overall WFL for tasks assessed    Lower Extremity Assessment Lower Extremity Assessment: Generalized weakness       Communication   Communication: No difficulties  Cognition Arousal/Alertness: Awake/alert Behavior During Therapy: WFL for tasks assessed/performed Overall Cognitive Status: Within Functional Limits for tasks assessed                                          General Comments      Exercises     Assessment/Plan    PT Assessment Patient needs continued PT services  PT Problem List Pain;Decreased strength;Decreased activity tolerance;Decreased mobility;Decreased knowledge of use of DME       PT Treatment Interventions DME instruction;Gait training;Functional mobility training;Therapeutic activities;Therapeutic exercise;Patient/family education    PT Goals (Current goals can be found in the Care Plan section)  Acute Rehab PT Goals Patient Stated Goal: none stated PT Goal Formulation: With patient Time For Goal Achievement: 02/16/22 Potential to Achieve Goals: Good    Frequency Min 3X/week     Co-evaluation               AM-PAC PT "6 Clicks" Mobility  Outcome Measure Help needed turning from your  back to your side while in a flat bed without using bedrails?: None Help needed moving from lying on your back to sitting on the side of a flat bed without using bedrails?: None Help needed moving to and from a bed to a chair (including a wheelchair)?: None Help needed standing up from a chair using your arms (e.g., wheelchair or bedside chair)?: None Help needed to walk in hospital room?: A Little Help needed climbing 3-5 steps with a railing? : A Little 6 Click Score: 22    End of Session   Activity Tolerance: Patient limited by pain Patient left: in bed;with call bell/phone within reach Nurse Communication: Mobility status;Patient requests pain meds PT Visit Diagnosis: Other abnormalities of gait and mobility (R26.89);Pain    Time: 11:00-11:20     Charges:  PT Evaluation              $PT Eval Low Complexity: 1 Low            Donna Bernard, PT   [facility] 02/10/2022, 12:33 PM

## 2022-02-10 NOTE — Progress Notes (Signed)
PROGRESS NOTE    Mallory Chavez  BJS:[id number] DOB: 03/24/1936 DOA: 02/08/2022 PCP: [name], MD   Brief Narrative: 86 year old female mated with diarrhea after taking stool softeners and suppository at home for constipation.  Patient had parathyroidectomy 2 weeks ago and was taking oxycodone since her surgery and developed constipation.  She denies nausea vomiting abdominal pain fever chills.  She has a history significant for ESRD on dialysis Monday Wednesday Friday chronic O2 dependent hypoxic respiratory failure, COPD.  In the ED her white count was 19,000.  CT abdomen and pelvis shows large volume of stool in the distal colon and rectum involved with perirectal inflammatory stranding and rectal wall thickening.  Manual disimpaction was attempted at the ED without success.  Assessment & Plan:   Principal Problem:   Fecal impaction ([place]) Active Problems:   COPD GOLD II   Chronic respiratory failure with hypoxia (HCC)   ESRD on dialysis ([place])   Overflow diarrhea   Hypocalcemia   #1 Fecal impaction-patient had large BM in the ER. CT abdomen and pelvis shows fecal impaction with stercoral colitis. Her white count on admission 19.3 down to 13.8.  No evidence of active infection. Consult PT recommending home health PT.  #2 ESRD on dialysis Monday Wednesday Friday Appreciate nephrology input  #3 hypokalemia/hypocalcemia/hypophosphatemia-being repleted by renal.  Phosphorus is normalized potassium still low at 2.8 still waiting for ionized calcium to be resulted.  #4 chronic hypoxic respiratory failure O2 dependent stable continue home meds   Estimated body mass index is 26.83 kg/m as calculated from the following:   Height as of this encounter: '5\' 4"'$  (1.626 m).   Weight as of this encounter: 70.9 kg.  DVT prophylaxis: heparin Code Status: Full code Family Communication: None at bedside  disposition Plan:  Status is: Observation The patient remains OBS appropriate and  will d/c before 2 midnights.   Consultants:  Nephrology  Procedures: None Antimicrobials: None  Subjective: She reports she is weak all over from having this diarrhea She is still having diarrhea denies nausea or vomiting  Objective: Vitals:   02/09/22 1900 02/10/22 0015 02/10/22 0742 02/10/22 0854  BP: (!) 118/54 (!) 105/52 (!) 103/51   Pulse: 64 83 82 79  Resp: '18 17 17 16  '$ Temp: 98.8 F (37.1 C) 99.5 F (37.5 C) 98.4 F (36.9 C)   TempSrc: Oral Oral Oral   SpO2: 92% 97% 93% 94%  Weight:      Height:        Intake/Output Summary (Last 24 hours) at 02/10/2022 1438 Last data filed at 02/10/2022 1100 Gross per 24 hour  Intake 350 ml  Output 3 ml  Net 347 ml   Filed Weights   02/08/22 1859 02/09/22 1240  Weight: 64.9 kg 70.9 kg    Examination:  General exam: Appears in no acute distress Respiratory system: Clear to auscultation. Respiratory effort normal. Cardiovascular system: S1 & S2 heard, RRR. No JVD, murmurs, rubs, gallops or clicks. No pedal edema. Gastrointestinal system: Abdomen is distended, soft and nontender. No organomegaly or masses felt. Normal bowel sounds heard. Central nervous system: Alert and oriented. No focal neurological deficits. Extremities: Symmetric 5 x 5 power. Skin: No rashes, lesions or ulcers Psychiatry: Judgement and insight appear normal. Mood & affect appropriate.     Data Reviewed: I have personally reviewed following labs and imaging studies  CBC: Recent Labs  Lab 02/08/22 2000 02/08/22 2010 02/09/22 0333 02/10/22 0713  WBC 19.3*  --  14.7* 13.8*  NEUTROABS 16.2*  --   --   --   HGB 9.0* 9.2* 8.8* 10.5*  HCT 27.9* 27.0* 27.9* 31.3*  MCV 104.1*  --  103.7* 100.6*  PLT 204  --  217 992    Basic Metabolic Panel: Recent Labs  Lab 02/08/22 2000 02/08/22 2010 02/09/22 0333 02/10/22 0713 02/10/22 1042  NA 136 133* 135  --  132*  134*  K 4.0 4.0 3.3*  --  2.9*  2.8*  CL 94* 96* 96*  --  90*  90*  CO2 28  --  28   --  26  28  GLUCOSE 135* 129* 115*  --  140*  141*  BUN 23 25* 26*  --  16  15  CREATININE 6.71* 7.10* 7.27*  --  5.30*  5.18*  CALCIUM 6.6*  --  6.2*  --  6.5*  6.7*  MG 3.3*  --   --   --  2.8*  PHOS  --   --  1.6* 4.0 3.2    GFR: Estimated Creatinine Clearance: 8 mL/min (A) (by C-G formula based on SCr of 5.18 mg/dL (H)). Liver Function Tests: Recent Labs  Lab 02/08/22 2000 02/09/22 0333 02/10/22 1042  AST 49* 45* 43*  ALT '15 16 15  '$ ALKPHOS 530* 486* 429*  BILITOT 0.5 0.5 0.4  PROT 6.9 6.6 5.9*  ALBUMIN 3.1* 2.9* 2.7*  2.7*    Recent Labs  Lab 02/08/22 2000  LIPASE 30    No results for input(s): "AMMONIA" in the last 168 hours. Coagulation Profile: No results for input(s): "INR", "PROTIME" in the last 168 hours. Cardiac Enzymes: No results for input(s): "CKTOTAL", "CKMB", "CKMBINDEX", "TROPONINI" in the last 168 hours. BNP (last 3 results) No results for input(s): "PROBNP" in the last 8760 hours. HbA1C: No results for input(s): "HGBA1C" in the last 72 hours. CBG: No results for input(s): "GLUCAP" in the last 168 hours. Lipid Profile: No results for input(s): "CHOL", "HDL", "LDLCALC", "TRIG", "CHOLHDL", "LDLDIRECT" in the last 72 hours. Thyroid Function Tests: No results for input(s): "TSH", "T4TOTAL", "FREET4", "T3FREE", "THYROIDAB" in the last 72 hours. Anemia Panel: No results for input(s): "VITAMINB12", "FOLATE", "FERRITIN", "TIBC", "IRON", "RETICCTPCT" in the last 72 hours. Sepsis Labs: No results for input(s): "PROCALCITON", "LATICACIDVEN" in the last 168 hours.  Recent Results (from the past 240 hour(s))  C Difficile Quick Screen w PCR reflex     Status: None   Collection Time: 02/08/22  7:25 PM   Specimen: STOOL  Result Value Ref Range Status   C Diff antigen NEGATIVE NEGATIVE Final   C Diff toxin NEGATIVE NEGATIVE Final   C Diff interpretation No C. difficile detected.  Final    Comment: Performed at [facility], [place] [address], [place], [place] [address]  Gastrointestinal Panel by PCR , Stool     Status: None   Collection Time: 02/08/22  7:25 PM   Specimen: STOOL  Result Value Ref Range Status   Campylobacter species NOT DETECTED NOT DETECTED Final   Plesimonas shigelloides NOT DETECTED NOT DETECTED Final   Salmonella species NOT DETECTED NOT DETECTED Final   Yersinia enterocolitica NOT DETECTED NOT DETECTED Final   Vibrio species NOT DETECTED NOT DETECTED Final   Vibrio cholerae NOT DETECTED NOT DETECTED Final   Enteroaggregative E coli (EAEC) NOT DETECTED NOT DETECTED Final   Enteropathogenic E coli (EPEC) NOT DETECTED NOT DETECTED Final   Enterotoxigenic E coli (ETEC) NOT DETECTED NOT DETECTED Final   Shiga  like toxin producing E coli (STEC) NOT DETECTED NOT DETECTED Final   Shigella/Enteroinvasive E coli (EIEC) NOT DETECTED NOT DETECTED Final   Cryptosporidium NOT DETECTED NOT DETECTED Final   Cyclospora cayetanensis NOT DETECTED NOT DETECTED Final   Entamoeba histolytica NOT DETECTED NOT DETECTED Final   Giardia lamblia NOT DETECTED NOT DETECTED Final   Adenovirus F40/41 NOT DETECTED NOT DETECTED Final   Astrovirus NOT DETECTED NOT DETECTED Final   Norovirus GI/GII NOT DETECTED NOT DETECTED Final   Rotavirus A NOT DETECTED NOT DETECTED Final   Sapovirus (I, II, IV, and V) NOT DETECTED NOT DETECTED Final    Comment: Performed at [facility], [address] [place], [place] [address]         Radiology Studies: CT ABDOMEN PELVIS WO CONTRAST  Result Date: 02/08/2022 CLINICAL DATA:  Abdominal pain, acute, nonlocalized. EXAM: CT ABDOMEN AND PELVIS WITHOUT CONTRAST TECHNIQUE: Multidetector CT imaging of the abdomen and pelvis was performed following the standard protocol without IV contrast. RADIATION DOSE REDUCTION: This exam was performed according to the departmental dose-optimization program which includes automated exposure control, adjustment of the mA and/or kV according to patient size  and/or use of iterative reconstruction technique. COMPARISON:  None Available. FINDINGS: Lower chest: Mild cardiomegaly with enlargement of the right ventricle and right atrium. At least moderate multi-vessel coronary artery calcification. No pericardial effusion. Visualized lung bases are clear. Hepatobiliary: No focal liver abnormality is seen. No gallstones, gallbladder wall thickening, or biliary dilatation. Pancreas: Unremarkable Spleen: Unremarkable Adrenals/Urinary Tract: The adrenal glands are unremarkable. The kidneys are markedly atrophic bilaterally. Multiple exophytic simple cortical cysts are seen bilaterally in keeping with cystic change of chronic renal failure. There are, however, 2 relatively hyperdense cortical lesions arising from the upper and lower pole of the left kidney measuring up to 62 Hounsfield units in density measuring up to 21 mm which are indeterminate. These may represent hyperdense renal cysts or solid renal masses. A similar 14 mm lesion is seen within the interpolar region of the right kidney. No hydronephrosis. 2-3 mm nonobstructing calculi are seen within the kidneys bilaterally. No ureteral calculi. The bladder is unremarkable. Stomach/Bowel: There is large volume stool seen within the distal colon and rectal vault with perirectal inflammatory stranding and rectal wall thickening suggesting changes of fecal impaction and/or stercoral proctitis. Superimposed moderate pancolonic diverticulosis without superimposed acute inflammatory change. The stomach, small bowel, and large bowel are otherwise unremarkable. Appendix normal. No free intraperitoneal gas or fluid. Vascular/Lymphatic: Extensive aortoiliac atherosclerotic calcification. No aortic aneurysm. No pathologic adenopathy within the abdomen and pelvis. Reproductive: Status post hysterectomy. Stable 2.8 cm right adnexal cystic lesion. While not optimally characterized on this examination, its stability over time is  compatible with a benign etiology and further follow-up is not required. Other: No abdominal wall hernia. Musculoskeletal: No acute bone abnormality. Diffuse sclerosis of the osseous structures is in keeping with changes of renal osteodystrophy. 3.4 cm intramuscular lipoma within the left gluteus maximus musculature. IMPRESSION: 1. Large volume stool within the distal colon and rectal vault with perirectal inflammatory stranding and rectal wall thickening suggesting changes of fecal impaction and/or stercoral proctitis. 2. Moderate pancolonic diverticulosis without superimposed acute inflammatory change. 3. Markedly atrophic kidneys bilaterally with multiple exophytic simple cortical cysts in keeping with cystic change of chronic renal failure. Superimposed bilateral hyperdense cortical lesions which may represent hyperdense renal cysts or solid renal masses. This would be better assessed with dedicated contrast enhanced CT or MRI examination, if indicated. 4. Mild cardiomegaly  with enlargement of the right ventricle and right atrium. At least moderate multi-vessel coronary artery calcification. 5. Diffuse sclerosis of the osseous structures in keeping with changes of renal osteodystrophy. Aortic Atherosclerosis (ICD10-I70.0). Electronically Signed   By: Fidela Salisbury M.D.   On: 02/08/2022 21:07        Scheduled Meds:  aspirin EC  81 mg Oral QPC supper   calcitRIOL  2 mcg Oral Q M,W,F   calcium carbonate  4 tablet Oral TID BM   Chlorhexidine Gluconate Cloth  6 each Topical Q0600   heparin  5,000 Units Subcutaneous Q8H   lactulose  20 g Oral Daily   mometasone-formoterol  2 puff Inhalation BID   polyvinyl alcohol  1 drop Both Eyes QHS   sodium chloride flush  3 mL Intravenous Q12H   Continuous Infusions:     LOS: 0 days    Time spent: 93 MIN  Georgette Shell, MD 02/10/2022, 2:38 PM

## 2022-02-10 NOTE — Plan of Care (Signed)
  Problem: Clinical Measurements: Goal: Ability to maintain clinical measurements within normal limits will improve Outcome: Progressing   

## 2022-02-10 NOTE — Progress Notes (Signed)
[facility] Progress Note  Subjective: pt seen in room, still having loose stools. No paresthesias.   Vitals:   02/09/22 1900 02/10/22 0015 02/10/22 0742 02/10/22 0854  BP: (!) 118/54 (!) 105/52 (!) 103/51   Pulse: 64 83 82 79  Resp: '18 17 17 16  '$ Temp: 98.8 F (37.1 C) 99.5 F (37.5 C) 98.4 F (36.9 C)   TempSrc: Oral Oral Oral   SpO2: 92% 97% 93% 94%  Weight:      Height:        Exam:  alert, nad   no jvd  Chest cta bilat  Cor reg no RG  Abd soft ntnd no ascites   Ext no LE edema   Alert, NF, ox3    LUA AVF+bruit    Home meds include - aspirin, budesnoide-formoterol, colchicine, oxycodone, sevelamer carbonate 2 ac, albuterol, famotidine, gabapentin, O2 home, prns/ vits/ supps    CT abd 8/10 - IMPRESSION: 1. Large volume stool within the distal colon and rectal vault with perirectal inflammatory stranding and rectal wall thickening suggesting changes of fecal impaction and/or stercoral proctitis. 2. Moderate pancolonic diverticulosis without superimposed acute inflammatory change.      OP HD: MWF [facility] 3h 68mn  4090/1.5  64.8kg  2/2 bath  P2  Heparin 4000  LUA AVF - last hep B labs here: 01/25/22 - last HD 8/9 post wt 65.7kg - last Hb 9.9 on 8/9 - mircera 30 q 2, last 8/9 - iron sucrose 50 weekly, last 8/9 - calcitriol 2.0 mcg po tiw   Assessment/ Plan: Acute diarrhea - post laxatives for constipation. CT here showing constipation, got enema and lactulose yesterday. Per pmd.  Hypocalcemia - Ca++ 6-7 range here which is low, fortunately is not symptomatic. SP recent parathyroidectomy and was dc'd w/ Ca++ 7.5- 8.5 range after 4-5 days of adjustments for "hungry bones syndrome" At home pt states she was taking 4 CaCO3 tabs only DAILY when it should have been QID. Have resumed CaCO3 at slightly lower dose of 4 po TID. It is also supposed to be given between meals so as not to bind phos. We also gave 4 gm IV Ca gluconate yesterday and continued her po calcitriol  at 2.0 ug po tiw w/ HD (and dc'd her daily home dose of 0.5 ug). Difficult stick today, awaiting labs.  Hypophosphatemia - phos < 2. We gave her 30 mmole IV Na phos yesterday, awaiting labs today.  ESRD - on HD MWF. Had HD here yesterday. Next HD Monday if still here. Volume- does not appear volume overloaded. Small UF w/ HD yest.  HTN - BP's low normal.  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 As above.  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 ESA was just given on 8/9 at OP unit, no need for esa here. Follow.       Rob Roberto Romanoski 02/10/2022, 11:49 AM   Recent Labs  Lab 02/08/22 2000 02/08/22 2010 02/09/22 0333 02/10/22 0713  HGB 9.0* 9.2* 8.8* 10.5*  ALBUMIN 3.1*  --  2.9*  --   CALCIUM 6.6*  --  6.2*  --   PHOS  --   --  1.6* 4.0  CREATININE 6.71* 7.10* 7.27*  --   K 4.0 4.0 3.3*  --    No results for input(s): "IRON", "TIBC", "FERRITIN" in the last 168 hours. Inpatient medications:  aspirin EC  81 mg Oral QPC supper   calcitRIOL  2 mcg Oral Q  M,W,F   calcium carbonate  4 tablet Oral TID BM   Chlorhexidine Gluconate Cloth  6 each Topical Q0600   heparin  5,000 Units Subcutaneous Q8H   lactulose  20 g Oral Daily   mometasone-formoterol  2 puff Inhalation BID   polyvinyl alcohol  1 drop Both Eyes QHS   sodium chloride flush  3 mL Intravenous Q12H    acetaminophen **OR** acetaminophen, oxyCODONE

## 2022-02-10 NOTE — Progress Notes (Signed)
PT Cancellation Note  Patient Details Name: Mallory Chavez MRN: [id number] DOB: 03-04-1936   Cancelled Treatment:    Reason Eval/Treat Not Completed: Other (comment) (Pt states she does not feel like getting up. Pt provided encouragement but still unwilling. Pt states she recently was up for grooming herself.) Will try again later as time allows.  Donna Bernard, PT   [facility] 02/10/2022, 8:00 AM

## 2022-02-10 NOTE — TOC Initial Note (Signed)
Transition of Care [facility]) - Initial/Assessment Note    Patient Details  Name: Mallory Chavez MRN: [id number] Date of Birth: Jan 26, 1936  Transition of Care [facility]) CM/SW Contact:    Bartholomew Crews, RN Phone Number: [phone] 02/10/2022, 11:03 AM  Clinical Narrative:                  Spoke with patient at the bedside to discuss post acute transition. PTA home alone. No personal caregiver assist. Daughter lives close, and will be available to provide transportation home at discharge. TOC following for transition needs.   Expected Discharge Plan: Home/Self Care     Patient Goals and CMS Choice Patient states their goals for this hospitalization and ongoing recovery are:: return home CMS Medicare.gov Compare Post Acute Care list provided to:: Patient Choice offered to / list presented to : NA  Expected Discharge Plan and Services Expected Discharge Plan: Home/Self Care   Discharge Planning Services: CM Consult                                          Prior Living Arrangements/Services   Lives with:: Self Patient language and need for interpreter reviewed:: Yes Do you feel safe going back to the place where you live?: Yes      Need for Family Participation in Patient Care: No (Comment)     Criminal Activity/Legal Involvement Pertinent to Current Situation/Hospitalization: No - Comment as needed  Activities of Daily Living Home Assistive Devices/Equipment: Cane (specify quad or straight), Walker (specify type) ADL Screening (condition at time of admission) Patient's cognitive ability adequate to safely complete daily activities?: Yes Is the patient deaf or have difficulty hearing?: Yes Does the patient have difficulty seeing, even when wearing glasses/contacts?: No Does the patient have difficulty concentrating, remembering, or making decisions?: No Patient able to express need for assistance with ADLs?: Yes Does the patient have difficulty dressing or bathing?:  No Independently performs ADLs?: Yes (appropriate for developmental age) Does the patient have difficulty walking or climbing stairs?: Yes Weakness of Legs: Left Weakness of Arms/Hands: None  Permission Sought/Granted                  Emotional Assessment Appearance:: Appears stated age Attitude/Demeanor/Rapport: Engaged Affect (typically observed): Accepting Orientation: : Oriented to Self, Oriented to Place, Oriented to  Time, Oriented to Situation Alcohol / Substance Use: Not Applicable Psych Involvement: No (comment)  Admission diagnosis:  Diarrhea of presumed infectious origin [R19.7] Fecal impaction [facility]) [K56.41] Patient Active Problem List   Diagnosis Date Noted   Fecal impaction ([place]) 02/08/2022   Overflow diarrhea 02/08/2022   Hypocalcemia 02/08/2022   Secondary hyperparathyroidism of renal origin ([place]) 01/25/2022   Hyperparathyroidism, secondary ([place]) 01/18/2022   Seasonal allergic rhinitis 08/30/2020   Palliative care encounter    DNR (do not resuscitate)    GERD (gastroesophageal reflux disease) 04/07/2018   Nausea 07/02/2016   Elevated LFTs 07/02/2016   Kidney lesion 07/02/2016   Diverticulosis 07/02/2016   ESRD on dialysis ([place])    Prediabetes 06/18/2016   Physical deconditioning 06/16/2016   Normocytic anemia 06/16/2016   Hyperglycemia 06/16/2016   Hyponatremia 06/16/2016   Chronic diastolic congestive heart failure ([place]) 06/15/2016   Chronic respiratory failure with hypoxia ([place]) 04/26/2014   Exercise hypoxemia 04/19/2014   COPD GOLD II 11/10/2012   Essential hypertension 10/03/2012   DOE (dyspnea on exertion) 10/01/2012  PCP:  [name], MD Pharmacy:   [place], [place] [address] [address] [place] [address] Phone: [phone] Fax: [phone]  [place] [address] Phone: [phone] Fax: [phone]     Social Determinants of Health (SDOH)  Interventions    Readmission Risk Interventions     No data to display

## 2022-02-11 DIAGNOSIS — K5641 Fecal impaction: Secondary | ICD-10-CM | POA: Diagnosis not present

## 2022-02-11 LAB — RENAL FUNCTION PANEL
Albumin: 2.6 g/dL — ABNORMAL LOW (ref 3.5–5.0)
Albumin: 2.5 g/dL — ABNORMAL LOW (ref 3.5–5.0)
Anion gap: 15 (ref 5–15)
Anion gap: 15 (ref 5–15)
BUN: 22 mg/dL (ref 8–23)
BUN: 28 mg/dL — ABNORMAL HIGH (ref 8–23)
CO2: 26 mmol/L (ref 22–32)
CO2: 27 mmol/L (ref 22–32)
Calcium: 7.6 mg/dL — ABNORMAL LOW (ref 8.9–10.3)
Calcium: 7.7 mg/dL — ABNORMAL LOW (ref 8.9–10.3)
Chloride: 92 mmol/L — ABNORMAL LOW (ref 98–111)
Chloride: 92 mmol/L — ABNORMAL LOW (ref 98–111)
Creatinine, Ser: 6.47 mg/dL — ABNORMAL HIGH (ref 0.44–1.00)
Creatinine, Ser: 7.28 mg/dL — ABNORMAL HIGH (ref 0.44–1.00)
GFR, Estimated: 6 mL/min — ABNORMAL LOW (ref >60)
GFR, Estimated: 5 mL/min — ABNORMAL LOW (ref >60)
Glucose, Bld: 99 mg/dL (ref 70–99)
Glucose, Bld: 131 mg/dL — ABNORMAL HIGH (ref 70–99)
Phosphorus: 2.5 mg/dL (ref 2.5–4.6)
Phosphorus: 1.8 mg/dL — ABNORMAL LOW (ref 2.5–4.6)
Potassium: 3.7 mmol/L (ref 3.5–5.1)
Potassium: 4.2 mmol/L (ref 3.5–5.1)
Sodium: 133 mmol/L — ABNORMAL LOW (ref 135–145)
Sodium: 134 mmol/L — ABNORMAL LOW (ref 135–145)

## 2022-02-11 MED ORDER — CHLORHEXIDINE GLUCONATE CLOTH 2 % EX PADS
6.0000 | MEDICATED_PAD | Freq: Every day | CUTANEOUS | Status: DC
  Administered 2022-02-11 – 2022-02-16 (×3): 6 via TOPICAL

## 2022-02-11 MED ORDER — CALCIUM CARBONATE 1250 (500 CA) MG PO TABS
4.0000 | ORAL_TABLET | Freq: Three times a day (TID) | ORAL | 2 refills | Status: DC
  Filled 2022-02-11: qty 360, 30d supply, fill #0

## 2022-02-11 MED ORDER — LOPERAMIDE HCL 2 MG PO CAPS
2.0000 mg | ORAL_CAPSULE | Freq: Once | ORAL | Status: DC
  Filled 2022-02-11: qty 1

## 2022-02-11 NOTE — Progress Notes (Signed)
PROGRESS NOTE    Mallory Chavez  RXV:[id number] DOB: [birth year] DOA: 02/08/2022 PCP: [name], MD   Brief Narrative: 86 year old female with diarrhea after taking  enema at home for constipation.  Patient had parathyroidectomy 2 weeks ago and was taking oxycodone since her surgery and developed constipation.  She denies nausea vomiting abdominal pain fever chills.  She has a history significant for ESRD on dialysis Monday Wednesday Friday chronic O2 dependent hypoxic respiratory failure, COPD.  In the ED her white count was 19,000.  CT abdomen and pelvis shows large volume of stool in the distal colon and rectum involved with perirectal inflammatory stranding and rectal wall thickening.  Manual disimpaction was attempted at the ED without success.  Assessment & Plan:   Principal Problem:   Fecal impaction ([place]) Active Problems:   COPD GOLD II   Chronic respiratory failure with hypoxia (HCC)   ESRD on dialysis ([place])   Overflow diarrhea   Hypocalcemia   #1 Fecal impaction-resolved.  CT abdomen and pelvis shows fecal impaction with stercoral colitis. Her white count on admission 19.3 down to 13.8.  No evidence of active infection. Consult PT recommending home health PT. Patient had no loose BM overnight or today.  Her daughter is requesting that patient stay today and get dialysis done tomorrow and then she will take her home.  She is worried that her mother will not be able to make it to dialysis tomorrow with weakness.  #2 ESRD on dialysis Monday Wednesday Friday Appreciate nephrology input  #3 hypokalemia/hypocalcemia/hypophosphatemia-resolved.  Ionized calcium from yesterday has not been resulted yet.  Called lab and spoke to them and they reported that this is a send out lab to [facility] and it takes 1 to 2 days to come back.    #4 chronic hypoxic respiratory failure O2 dependent stable continue home meds   Estimated body mass index is 26.83 kg/m as calculated from the  following:   Height as of this encounter: '5\' 4"'$  (1.626 m).   Weight as of this encounter: 70.9 kg.  DVT prophylaxis: heparin Code Status: Full code Family Communication: None at bedside  disposition Plan:  Status is: Observation The patient remains OBS appropriate and will d/c before 2 midnights.   Consultants:  Nephrology  Procedures: None Antimicrobials: None  Subjective: She is resting in bed She does not want to go home today She wants to have dialysis done tomorrow and then go home   Objective: Vitals:   02/10/22 1955 02/11/22 0534 02/11/22 0801 02/11/22 0859  BP: (!) 96/57 (!) 115/58 (!) 95/46   Pulse: 80 69 74   Resp: '16 18 15   '$ Temp: 98.5 F (36.9 C) 98.5 F (36.9 C) 97.9 F (36.6 C)   TempSrc: Oral Oral Oral   SpO2: 100% 95% 96% 97%  Weight:      Height:        Intake/Output Summary (Last 24 hours) at 02/11/2022 1102 Last data filed at 02/11/2022 0900 Gross per 24 hour  Intake 240 ml  Output 2 ml  Net 238 ml    Filed Weights   02/08/22 1859 02/09/22 1240  Weight: 64.9 kg 70.9 kg    Examination:  General exam: Appears in no acute distress Respiratory system: Clear to auscultation. Respiratory effort normal. Cardiovascular system: S1 & S2 heard, RRR. No JVD, murmurs, rubs, gallops or clicks. No pedal edema. Gastrointestinal system: Abdomen is distended, soft and nontender. No organomegaly or masses felt. Normal bowel sounds heard. Central nervous system:  Alert and oriented. No focal neurological deficits. Extremities: Symmetric 5 x 5 power. Skin: No rashes, lesions or ulcers Psychiatry: Judgement and insight appear normal. Mood & affect appropriate.     Data Reviewed: I have personally reviewed following labs and imaging studies  CBC: Recent Labs  Lab 02/08/22 2000 02/08/22 2010 02/09/22 0333 02/10/22 0713  WBC 19.3*  --  14.7* 13.8*  NEUTROABS 16.2*  --   --   --   HGB 9.0* 9.2* 8.8* 10.5*  HCT 27.9* 27.0* 27.9* 31.3*  MCV 104.1*  --   103.7* 100.6*  PLT 204  --  217 782    Basic Metabolic Panel: Recent Labs  Lab 02/08/22 2000 02/08/22 2010 02/09/22 0333 02/10/22 0713 02/10/22 1042 02/11/22 0547  NA 136 133* 135  --  132*  134* 133*  K 4.0 4.0 3.3*  --  2.9*  2.8* 3.7  CL 94* 96* 96*  --  90*  90* 92*  CO2 28  --  28  --  '26  28 26  '$ GLUCOSE 135* 129* 115*  --  140*  141* 99  BUN 23 25* 26*  --  '16  15 22  '$ CREATININE 6.71* 7.10* 7.27*  --  5.30*  5.18* 6.47*  CALCIUM 6.6*  --  6.2*  --  6.5*  6.7* 7.6*  MG 3.3*  --   --   --  2.8*  --   PHOS  --   --  1.6* 4.0 3.2 2.5    GFR: Estimated Creatinine Clearance: 6.4 mL/min (A) (by C-G formula based on SCr of 6.47 mg/dL (H)). Liver Function Tests: Recent Labs  Lab 02/08/22 2000 02/09/22 0333 02/10/22 1042 02/11/22 0547  AST 49* 45* 43*  --   ALT '15 16 15  '$ --   ALKPHOS 530* 486* 429*  --   BILITOT 0.5 0.5 0.4  --   PROT 6.9 6.6 5.9*  --   ALBUMIN 3.1* 2.9* 2.7*  2.7* 2.6*    Recent Labs  Lab 02/08/22 2000  LIPASE 30    No results for input(s): "AMMONIA" in the last 168 hours. Coagulation Profile: No results for input(s): "INR", "PROTIME" in the last 168 hours. Cardiac Enzymes: No results for input(s): "CKTOTAL", "CKMB", "CKMBINDEX", "TROPONINI" in the last 168 hours. BNP (last 3 results) No results for input(s): "PROBNP" in the last 8760 hours. HbA1C: No results for input(s): "HGBA1C" in the last 72 hours. CBG: No results for input(s): "GLUCAP" in the last 168 hours. Lipid Profile: No results for input(s): "CHOL", "HDL", "LDLCALC", "TRIG", "CHOLHDL", "LDLDIRECT" in the last 72 hours. Thyroid Function Tests: No results for input(s): "TSH", "T4TOTAL", "FREET4", "T3FREE", "THYROIDAB" in the last 72 hours. Anemia Panel: No results for input(s): "VITAMINB12", "FOLATE", "FERRITIN", "TIBC", "IRON", "RETICCTPCT" in the last 72 hours. Sepsis Labs: No results for input(s): "PROCALCITON", "LATICACIDVEN" in the last 168 hours.  Recent  Results (from the past 240 hour(s))  C Difficile Quick Screen w PCR reflex     Status: None   Collection Time: 02/08/22  7:25 PM   Specimen: STOOL  Result Value Ref Range Status   C Diff antigen NEGATIVE NEGATIVE Final   C Diff toxin NEGATIVE NEGATIVE Final   C Diff interpretation No C. difficile detected.  Final    Comment: Performed at [facility], [place] [address], [place], [place] [address]  Gastrointestinal Panel by PCR , Stool     Status: None   Collection Time: 02/08/22  7:25 PM  Specimen: STOOL  Result Value Ref Range Status   Campylobacter species NOT DETECTED NOT DETECTED Final   Plesimonas shigelloides NOT DETECTED NOT DETECTED Final   Salmonella species NOT DETECTED NOT DETECTED Final   Yersinia enterocolitica NOT DETECTED NOT DETECTED Final   Vibrio species NOT DETECTED NOT DETECTED Final   Vibrio cholerae NOT DETECTED NOT DETECTED Final   Enteroaggregative E coli (EAEC) NOT DETECTED NOT DETECTED Final   Enteropathogenic E coli (EPEC) NOT DETECTED NOT DETECTED Final   Enterotoxigenic E coli (ETEC) NOT DETECTED NOT DETECTED Final   Shiga like toxin producing E coli (STEC) NOT DETECTED NOT DETECTED Final   Shigella/Enteroinvasive E coli (EIEC) NOT DETECTED NOT DETECTED Final   Cryptosporidium NOT DETECTED NOT DETECTED Final   Cyclospora cayetanensis NOT DETECTED NOT DETECTED Final   Entamoeba histolytica NOT DETECTED NOT DETECTED Final   Giardia lamblia NOT DETECTED NOT DETECTED Final   Adenovirus F40/41 NOT DETECTED NOT DETECTED Final   Astrovirus NOT DETECTED NOT DETECTED Final   Norovirus GI/GII NOT DETECTED NOT DETECTED Final   Rotavirus A NOT DETECTED NOT DETECTED Final   Sapovirus (I, II, IV, and V) NOT DETECTED NOT DETECTED Final    Comment: Performed at [facility] [address], [place], [place] [address]         Radiology Studies: No results found.      Scheduled Meds:  aspirin EC  81 mg Oral QPC supper   calcitRIOL  2 mcg Oral  Q M,W,F   calcium carbonate  4 tablet Oral TID BM   Chlorhexidine Gluconate Cloth  6 each Topical Q0600   Chlorhexidine Gluconate Cloth  6 each Topical Q0600   heparin  5,000 Units Subcutaneous Q8H   mometasone-formoterol  2 puff Inhalation BID   polyvinyl alcohol  1 drop Both Eyes QHS   sodium chloride flush  3 mL Intravenous Q12H   Continuous Infusions:     LOS: 0 days    Time spent: 35 minutes  Georgette Shell, MD 02/11/2022, 11:02 AM

## 2022-02-11 NOTE — Plan of Care (Signed)
  Problem: Nutrition: Goal: Adequate nutrition will be maintained Outcome: Progressing   Problem: Elimination: Goal: Will not experience complications related to bowel motility Outcome: Progressing   Problem: Pain Managment: Goal: General experience of comfort will improve Outcome: Progressing   Problem: Safety: Goal: Ability to remain free from injury will improve Outcome: Progressing   

## 2022-02-11 NOTE — Progress Notes (Signed)
[facility] Progress Note   Subjective:   Reports feeling well today, no diarrhea overnight. Only complaints are itching/dry skin and hungry this AM. Denies SOB, CP, dizziness, abdominal pain, tingling and paresthesias. Calcium improved to 7.6, corrected calcium 8.7.   Objective Vitals:   02/10/22 1955 02/11/22 0534 02/11/22 0801 02/11/22 0859  BP: (!) 96/57 (!) 115/58 (!) 95/46   Pulse: 80 69 74   Resp: '16 18 15   '$ Temp: 98.5 F (36.9 C) 98.5 F (36.9 C) 97.9 F (36.6 C)   TempSrc: Oral Oral Oral   SpO2: 100% 95% 96% 97%  Weight:      Height:       Physical Exam General: Alert female in NAD Heart: RRR, no murmurs, rubs or gallops Lungs: CTA bilaterally without wheezing, rhonchi or rales Abdomen: Soft, non-distended +BS Extremities: No edema b/l lower extremities Dialysis Access:  LUE AVF + bruit  Additional Objective Labs: Basic Metabolic Panel: Recent Labs  Lab 02/09/22 0333 02/10/22 0713 02/10/22 1042 02/11/22 0547  NA 135  --  132*  134* 133*  K 3.3*  --  2.9*  2.8* 3.7  CL 96*  --  90*  90* 92*  CO2 28  --  '26  28 26  '$ GLUCOSE 115*  --  140*  141* 99  BUN 26*  --  '16  15 22  '$ CREATININE 7.27*  --  5.30*  5.18* 6.47*  CALCIUM 6.2*  --  6.5*  6.7* 7.6*  PHOS 1.6* 4.0 3.2 2.5   Liver Function Tests: Recent Labs  Lab 02/08/22 2000 02/09/22 0333 02/10/22 1042 02/11/22 0547  AST 49* 45* 43*  --   ALT '15 16 15  '$ --   ALKPHOS 530* 486* 429*  --   BILITOT 0.5 0.5 0.4  --   PROT 6.9 6.6 5.9*  --   ALBUMIN 3.1* 2.9* 2.7*  2.7* 2.6*   Recent Labs  Lab 02/08/22 2000  LIPASE 30   CBC: Recent Labs  Lab 02/08/22 2000 02/08/22 2010 02/09/22 0333 02/10/22 0713  WBC 19.3*  --  14.7* 13.8*  NEUTROABS 16.2*  --   --   --   HGB 9.0* 9.2* 8.8* 10.5*  HCT 27.9* 27.0* 27.9* 31.3*  MCV 104.1*  --  103.7* 100.6*  PLT 204  --  217 193   Blood Culture No results found for: "SDES", "SPECREQUEST", "CULT", "REPTSTATUS"  Cardiac  Enzymes: No results for input(s): "CKTOTAL", "CKMB", "CKMBINDEX", "TROPONINI" in the last 168 hours. CBG: No results for input(s): "GLUCAP" in the last 168 hours. Iron Studies: No results for input(s): "IRON", "TIBC", "TRANSFERRIN", "FERRITIN" in the last 72 hours. '@lablastinr3'$ @ Studies/Results: No results found. Medications:   aspirin EC  81 mg Oral QPC supper   calcitRIOL  2 mcg Oral Q M,W,F   calcium carbonate  4 tablet Oral TID BM   Chlorhexidine Gluconate Cloth  6 each Topical Q0600   heparin  5,000 Units Subcutaneous Q8H   lactulose  20 g Oral Daily   mometasone-formoterol  2 puff Inhalation BID   polyvinyl alcohol  1 drop Both Eyes QHS   sodium chloride flush  3 mL Intravenous Q12H    Dialysis Orders:  MWF [facility] 3h 16mn  4090/1.5  64.8kg  2/2 bath  P2  Heparin 4000  LUA AVF - last hep B labs here: 01/25/22 - last HD 8/9 post wt 65.7kg - last Hb 9.9 on 8/9 - mircera 30 q 2, last 8/9 - iron sucrose  50 weekly, last 8/9 - calcitriol 2.0 mcg po tiw  Assessment/Plan: Acute diarrhea - post laxatives for constipation. CT here showing constipation, got enema and lactulose. Improved, management per primary MD Hypocalcemia - Presented with Ca++ 6-7 range here which is low, fortunately is not symptomatic. SP recent parathyroidectomy and was dc'd w/ Ca++ 7.5- 8.5 range after 4-5 days of adjustments for "hungry bones syndrome" At home pt states she was taking 4 CaCO3 tabs only DAILY when it should have been QID. Have resumed CaCO3 at slightly lower dose of 4 po TID. It is also supposed to be given between meals so as not to bind phos. We also gave 4 gm IV Ca gluconate yesterday and continued her po calcitriol at 2.0 ug po tiw w/ HD (and dc'd her daily home dose of 0.5 ug). Calcium is improved today, corrected calcium at goal.  Hypophosphatemia - phos was < 2. We gave her 30 mmole IV Na phos. Improved to 3.7 today. Stop renvela at discharge.  ESRD - on HD MWF.  Next HD Monday if still  here. Volume- does not appear volume overloaded. Small UF w/ HD yest.  HTN - BP's low normal. Not on any antihypertensive meds.  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 As above.  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 ESA was just given on 8/9 at OP unit, no need for esa here.     Anice Paganini, PA-C 02/11/2022, 9:46 AM  [facility] Pager: [phone]

## 2022-02-12 ENCOUNTER — Other Ambulatory Visit (HOSPITAL_COMMUNITY): Payer: Self-pay

## 2022-02-12 ENCOUNTER — Observation Stay (HOSPITAL_COMMUNITY): Payer: Medicare Other

## 2022-02-12 DIAGNOSIS — K5641 Fecal impaction: Secondary | ICD-10-CM | POA: Diagnosis not present

## 2022-02-12 LAB — CBC
HCT: 28.2 % — ABNORMAL LOW (ref 36.0–46.0)
Hemoglobin: 9.1 g/dL — ABNORMAL LOW (ref 12.0–15.0)
MCH: 32.9 pg (ref 26.0–34.0)
MCHC: 32.3 g/dL (ref 30.0–36.0)
MCV: 101.8 fL — ABNORMAL HIGH (ref 80.0–100.0)
Platelets: 247 10*3/uL (ref 150–400)
RBC: 2.77 MIL/uL — ABNORMAL LOW (ref 3.87–5.11)
RDW: 16 % — ABNORMAL HIGH (ref 11.5–15.5)
WBC: 8.2 10*3/uL (ref 4.0–10.5)
nRBC: 0.4 % — ABNORMAL HIGH (ref 0.0–0.2)

## 2022-02-12 LAB — RENAL FUNCTION PANEL
Albumin: 2.6 g/dL — ABNORMAL LOW (ref 3.5–5.0)
Anion gap: 12 (ref 5–15)
BUN: 29 mg/dL — ABNORMAL HIGH (ref 8–23)
CO2: 28 mmol/L (ref 22–32)
Calcium: 9 mg/dL (ref 8.9–10.3)
Chloride: 93 mmol/L — ABNORMAL LOW (ref 98–111)
Creatinine, Ser: 8.43 mg/dL — ABNORMAL HIGH (ref 0.44–1.00)
GFR, Estimated: 4 mL/min — ABNORMAL LOW (ref >60)
Glucose, Bld: 95 mg/dL (ref 70–99)
Phosphorus: 2.1 mg/dL — ABNORMAL LOW (ref 2.5–4.6)
Potassium: 3.8 mmol/L (ref 3.5–5.1)
Sodium: 133 mmol/L — ABNORMAL LOW (ref 135–145)

## 2022-02-12 LAB — CALCIUM, IONIZED
Calcium, Ionized, Serum: 3.6 mg/dL — ABNORMAL LOW (ref 4.5–5.6)
Calcium, Ionized, Serum: 3.6 mg/dL — ABNORMAL LOW (ref 4.5–5.6)
Calcium, Ionized, Serum: 4 mg/dL — ABNORMAL LOW (ref 4.5–5.6)

## 2022-02-12 MED ORDER — BISACODYL 10 MG RE SUPP
10.0000 mg | Freq: Once | RECTAL | Status: AC
Start: 2022-02-12 — End: 2022-02-12
  Administered 2022-02-12: 10 mg via RECTAL
  Filled 2022-02-12: qty 1

## 2022-02-12 MED ORDER — HYDROXYZINE HCL 10 MG PO TABS
10.0000 mg | ORAL_TABLET | Freq: Three times a day (TID) | ORAL | Status: DC | PRN
Start: 2022-02-12 — End: 2022-02-16
  Administered 2022-02-14: 10 mg via ORAL
  Filled 2022-02-12: qty 1

## 2022-02-12 MED ORDER — SORBITOL 70 % SOLN
200.0000 mL | TOPICAL_OIL | Freq: Once | ORAL | Status: AC
  Administered 2022-02-12: 200 mL via RECTAL
  Filled 2022-02-12: qty 60

## 2022-02-12 MED ORDER — LACTULOSE 10 GM/15ML PO SOLN
20.0000 g | Freq: Three times a day (TID) | ORAL | Status: DC
  Administered 2022-02-12 – 2022-02-13 (×5): 20 g via ORAL
  Filled 2022-02-12 (×5): qty 30

## 2022-02-12 NOTE — Progress Notes (Signed)
PROGRESS NOTE    Mallory Chavez  TGG:[id number] DOB: [birth year] DOA: 02/08/2022 PCP: [name], MD   Brief Narrative: 86 year old female with diarrhea after taking  enema at home for constipation.  Patient had parathyroidectomy 2 weeks ago and was taking oxycodone since her surgery and developed constipation.  She denies nausea vomiting abdominal pain fever chills.  She has a history significant for ESRD on dialysis Monday Wednesday Friday chronic O2 dependent hypoxic respiratory failure, COPD.  In the ED her white count was 19,000.  CT abdomen and pelvis shows large volume of stool in the distal colon and rectum involved with perirectal inflammatory stranding and rectal wall thickening.  Manual disimpaction was attempted at the ED without success.  Assessment & Plan:   Principal Problem:   Fecal impaction ([place]) Active Problems:   COPD GOLD II   Chronic respiratory failure with hypoxia (HCC)   ESRD on dialysis ([place])   Overflow diarrhea   Hypocalcemia   #1 Fecal impaction- CT abdomen and pelvis shows fecal impaction with stercoral colitis. KUB 02/12/2022 with: Heavy burden of stool in the entire colon. Trial of disimpaction Lactulose 3 times daily, Dulcolax suppository, enema Repeat KUB in a.m. Her white count on admission 19.3 down to 13.8.  No evidence of active infection. Consult PT recommending home health PT.  #2 ESRD on dialysis Monday Wednesday Friday Appreciate nephrology input  #3 hypokalemia/hypocalcemia/hypophosphatemia-resolved.      #4 chronic hypoxic respiratory failure O2 dependent stable continue home meds   Estimated body mass index is 23.8 kg/m as calculated from the following:   Height as of this encounter: '5\' 4"'$  (1.626 m).   Weight as of this encounter: 62.9 kg.  DVT prophylaxis: heparin Code Status: Full code Family Communication: None at bedside  disposition Plan:  Status is: Observation The patient remains OBS appropriate and will d/c before  2 midnights.   Consultants:  Nephrology  Procedures: None Antimicrobials: None  Subjective:  Patient seen in dialysis and again on the floor multiple times complaining of constipation feels like a rock sitting down there and not able to have a bowel movement  Objective: Vitals:   02/12/22 1100 02/12/22 1150 02/12/22 1204 02/12/22 1255  BP: (!) 125/56 (!) 91/43  116/70  Pulse: 71 61  76  Resp: '12 12  17  '$ Temp:  98.2 F (36.8 C)  98 F (36.7 C)  TempSrc:  Oral  Oral  SpO2: 94% 92%  92%  Weight:   62.9 kg   Height:        Intake/Output Summary (Last 24 hours) at 02/12/2022 1501 Last data filed at 02/12/2022 1309 Gross per 24 hour  Intake 120 ml  Output 0 ml  Net 120 ml    Filed Weights   02/09/22 1240 02/12/22 0750 02/12/22 1204  Weight: 70.9 kg 64.4 kg 62.9 kg    Examination:  General exam: Appears in no acute distress Respiratory system: Clear to auscultation. Respiratory effort normal. Cardiovascular system: S1 & S2 heard, RRR. No JVD, murmurs, rubs, gallops or clicks. No pedal edema. Gastrointestinal system: Abdomen is distended, soft and nontender. No organomegaly or masses felt. Normal bowel sounds heard. Central nervous system: Alert and oriented. No focal neurological deficits. Extremities: Symmetric 5 x 5 power. Skin: No rashes, lesions or ulcers Psychiatry: Judgement and insight appear normal. Mood & affect appropriate.     Data Reviewed: I have personally reviewed following labs and imaging studies  CBC: Recent Labs  Lab 02/08/22 2000 02/08/22 2010 02/09/22  3154 02/10/22 0713 02/12/22 0830  WBC 19.3*  --  14.7* 13.8* 8.2  NEUTROABS 16.2*  --   --   --   --   HGB 9.0* 9.2* 8.8* 10.5* 9.1*  HCT 27.9* 27.0* 27.9* 31.3* 28.2*  MCV 104.1*  --  103.7* 100.6* 101.8*  PLT 204  --  217 193 008    Basic Metabolic Panel: Recent Labs  Lab 02/08/22 2000 02/08/22 2010 02/09/22 0333 02/10/22 0713 02/10/22 1042 02/11/22 0547 02/11/22 1648  02/12/22 0830  NA 136   < > 135  --  132*  134* 133* 134* 133*  K 4.0   < > 3.3*  --  2.9*  2.8* 3.7 4.2 3.8  CL 94*   < > 96*  --  90*  90* 92* 92* 93*  CO2 28  --  28  --  '26  28 26 27 28  '$ GLUCOSE 135*   < > 115*  --  140*  141* 99 131* 95  BUN 23   < > 26*  --  '16  15 22 '$ 28* 29*  CREATININE 6.71*   < > 7.27*  --  5.30*  5.18* 6.47* 7.28* 8.43*  CALCIUM 6.6*  --  6.2*  --  6.5*  6.7* 7.6* 7.7* 9.0  MG 3.3*  --   --   --  2.8*  --   --   --   PHOS  --    < > 1.6* 4.0 3.2 2.5 1.8* 2.1*   < > = values in this interval not displayed.    GFR: Estimated Creatinine Clearance: 4.4 mL/min (A) (by C-G formula based on SCr of 8.43 mg/dL (H)). Liver Function Tests: Recent Labs  Lab 02/08/22 2000 02/09/22 0333 02/10/22 1042 02/11/22 0547 02/11/22 1648 02/12/22 0830  AST 49* 45* 43*  --   --   --   ALT '15 16 15  '$ --   --   --   ALKPHOS 530* 486* 429*  --   --   --   BILITOT 0.5 0.5 0.4  --   --   --   PROT 6.9 6.6 5.9*  --   --   --   ALBUMIN 3.1* 2.9* 2.7*  2.7* 2.6* 2.5* 2.6*    Recent Labs  Lab 02/08/22 2000  LIPASE 30    No results for input(s): "AMMONIA" in the last 168 hours. Coagulation Profile: No results for input(s): "INR", "PROTIME" in the last 168 hours. Cardiac Enzymes: No results for input(s): "CKTOTAL", "CKMB", "CKMBINDEX", "TROPONINI" in the last 168 hours. BNP (last 3 results) No results for input(s): "PROBNP" in the last 8760 hours. HbA1C: No results for input(s): "HGBA1C" in the last 72 hours. CBG: No results for input(s): "GLUCAP" in the last 168 hours. Lipid Profile: No results for input(s): "CHOL", "HDL", "LDLCALC", "TRIG", "CHOLHDL", "LDLDIRECT" in the last 72 hours. Thyroid Function Tests: No results for input(s): "TSH", "T4TOTAL", "FREET4", "T3FREE", "THYROIDAB" in the last 72 hours. Anemia Panel: No results for input(s): "VITAMINB12", "FOLATE", "FERRITIN", "TIBC", "IRON", "RETICCTPCT" in the last 72 hours. Sepsis Labs: No results for  input(s): "PROCALCITON", "LATICACIDVEN" in the last 168 hours.  Recent Results (from the past 240 hour(s))  C Difficile Quick Screen w PCR reflex     Status: None   Collection Time: 02/08/22  7:25 PM   Specimen: STOOL  Result Value Ref Range Status   C Diff antigen NEGATIVE NEGATIVE Final   C Diff toxin NEGATIVE NEGATIVE Final  C Diff interpretation No C. difficile detected.  Final    Comment: Performed at [facility], [place] [address] [place], [place] [address]  Gastrointestinal Panel by PCR , Stool     Status: None   Collection Time: 02/08/22  7:25 PM   Specimen: STOOL  Result Value Ref Range Status   Campylobacter species NOT DETECTED NOT DETECTED Final   Plesimonas shigelloides NOT DETECTED NOT DETECTED Final   Salmonella species NOT DETECTED NOT DETECTED Final   Yersinia enterocolitica NOT DETECTED NOT DETECTED Final   Vibrio species NOT DETECTED NOT DETECTED Final   Vibrio cholerae NOT DETECTED NOT DETECTED Final   Enteroaggregative E coli (EAEC) NOT DETECTED NOT DETECTED Final   Enteropathogenic E coli (EPEC) NOT DETECTED NOT DETECTED Final   Enterotoxigenic E coli (ETEC) NOT DETECTED NOT DETECTED Final   Shiga like toxin producing E coli (STEC) NOT DETECTED NOT DETECTED Final   Shigella/Enteroinvasive E coli (EIEC) NOT DETECTED NOT DETECTED Final   Cryptosporidium NOT DETECTED NOT DETECTED Final   Cyclospora cayetanensis NOT DETECTED NOT DETECTED Final   Entamoeba histolytica NOT DETECTED NOT DETECTED Final   Giardia lamblia NOT DETECTED NOT DETECTED Final   Adenovirus F40/41 NOT DETECTED NOT DETECTED Final   Astrovirus NOT DETECTED NOT DETECTED Final   Norovirus GI/GII NOT DETECTED NOT DETECTED Final   Rotavirus A NOT DETECTED NOT DETECTED Final   Sapovirus (I, II, IV, and V) NOT DETECTED NOT DETECTED Final    Comment: Performed at [facility], [address], [place], [place] [address]         Radiology Studies: DG Abd 1 View  Result Date:  02/12/2022 CLINICAL DATA:  Intractable nausea and vomiting. EXAM: ABDOMEN - 1 VIEW COMPARISON:  None Available. FINDINGS: Stool is seen in the majority of the colon. No small bowel dilatation. IMPRESSION: Stool in the majority of the colon is indicative of constipation. Electronically Signed   By: Lorin Picket M.D.   On: 02/12/2022 14:53        Scheduled Meds:  aspirin EC  81 mg Oral QPC supper   bisacodyl  10 mg Rectal Once   calcitRIOL  2 mcg Oral Q M,W,F   calcium carbonate  4 tablet Oral TID BM   Chlorhexidine Gluconate Cloth  6 each Topical Q0600   Chlorhexidine Gluconate Cloth  6 each Topical Q0600   heparin  5,000 Units Subcutaneous Q8H   mometasone-formoterol  2 puff Inhalation BID   polyvinyl alcohol  1 drop Both Eyes QHS   sodium chloride flush  3 mL Intravenous Q12H   Continuous Infusions:     LOS: 0 days    Time spent: 35 minutes  Georgette Shell, MD 02/12/2022, 3:01 PM

## 2022-02-12 NOTE — Progress Notes (Signed)
[facility] Progress Note   Subjective:   Patient seen and examined in HD today. Tolerating treatment, no complaints. Eager to go home  Objective Vitals:   02/12/22 0900 02/12/22 0930 02/12/22 1000 02/12/22 1030  BP: (!) 121/54 (!) 98/43 109/60 (!) 107/56  Pulse: 72 66 73 71  Resp: '15 11 12 11  '$ Temp:      TempSrc:      SpO2: 93% 92% 94% 94%  Weight:      Height:       Physical Exam General: Alert female in NAD Heart: RRR, no murmurs, rubs or gallops Lungs: CTA bilaterally without wheezing, rhonchi or rales Abdomen: Soft, non-distended +BS Extremities: No edema b/l lower extremities Dialysis Access:  LUE AVF + bruit  Additional Objective Labs: Basic Metabolic Panel: Recent Labs  Lab 02/11/22 0547 02/11/22 1648 02/12/22 0830  NA 133* 134* 133*  K 3.7 4.2 3.8  CL 92* 92* 93*  CO2 '26 27 28  '$ GLUCOSE 99 131* 95  BUN 22 28* 29*  CREATININE 6.47* 7.28* 8.43*  CALCIUM 7.6* 7.7* 9.0  PHOS 2.5 1.8* 2.1*   Liver Function Tests: Recent Labs  Lab 02/08/22 2000 02/09/22 0333 02/10/22 1042 02/11/22 0547 02/11/22 1648 02/12/22 0830  AST 49* 45* 43*  --   --   --   ALT '15 16 15  '$ --   --   --   ALKPHOS 530* 486* 429*  --   --   --   BILITOT 0.5 0.5 0.4  --   --   --   PROT 6.9 6.6 5.9*  --   --   --   ALBUMIN 3.1* 2.9* 2.7*  2.7* 2.6* 2.5* 2.6*   Recent Labs  Lab 02/08/22 2000  LIPASE 30   CBC: Recent Labs  Lab 02/08/22 2000 02/08/22 2010 02/09/22 0333 02/10/22 0713 02/12/22 0830  WBC 19.3*  --  14.7* 13.8* 8.2  NEUTROABS 16.2*  --   --   --   --   HGB 9.0*   < > 8.8* 10.5* 9.1*  HCT 27.9*   < > 27.9* 31.3* 28.2*  MCV 104.1*  --  103.7* 100.6* 101.8*  PLT 204  --  217 193 247   < > = values in this interval not displayed.   Blood Culture No results found for: "SDES", "SPECREQUEST", "CULT", "REPTSTATUS"  Cardiac Enzymes: No results for input(s): "CKTOTAL", "CKMB", "CKMBINDEX", "TROPONINI" in the last 168 hours. CBG: No results for  input(s): "GLUCAP" in the last 168 hours. Iron Studies: No results for input(s): "IRON", "TIBC", "TRANSFERRIN", "FERRITIN" in the last 72 hours. '@lablastinr3'$ @ Studies/Results: No results found. Medications:   aspirin EC  81 mg Oral QPC supper   calcitRIOL  2 mcg Oral Q M,W,F   calcium carbonate  4 tablet Oral TID BM   Chlorhexidine Gluconate Cloth  6 each Topical Q0600   Chlorhexidine Gluconate Cloth  6 each Topical Q0600   heparin  5,000 Units Subcutaneous Q8H   mometasone-formoterol  2 puff Inhalation BID   polyvinyl alcohol  1 drop Both Eyes QHS   sodium chloride flush  3 mL Intravenous Q12H    Dialysis Orders:  MWF [facility] 3h 41mn  4090/1.5  64.8kg  2/2 bath  P2  Heparin 4000  LUA AVF - last hep B labs here: 01/25/22 - last HD 8/9 post wt 65.7kg - last Hb 9.9 on 8/9 - mircera 30 q 2, last 8/9 - iron sucrose 50 weekly, last 8/9 -  calcitriol 2.0 mcg po tiw  Assessment/Plan: Acute diarrhea - post laxatives for constipation. CT here showing constipation, got enema and lactulose. Improved, management per primary MD Hypocalcemia - Presented with Ca++ 6-7 range here which is low, fortunately is not symptomatic. SP recent parathyroidectomy and was dc'd w/ Ca++ 7.5- 8.5 range after 4-5 days of adjustments for "hungry bones syndrome" At home pt states she was taking 4 CaCO3 tabs only DAILY when it should have been QID. Have resumed CaCO3 at slightly lower dose of 4 po TID. It is also supposed to be given between meals so as not to bind phos. We also gave 4 gm IV Ca gluconate on 8/12 and continued her po calcitriol at 2.0 ug po tiw w/ HD (and dc'd her daily home dose of 0.5 ug). Calcium is improved today Hypophosphatemia - phos was < 2. We gave her 30 mmole IV Na phos. Improved to 2.1 today. Do not resume renvela at discharge.  ESRD - on HD MWF which we will continue here Volume- does not appear volume overloaded. Uf as tolerated HTN - BP's low normal. Not on any antihypertensive meds.  COPD  - cont home meds MBD ckd - PO4 low and Ca low. As above.  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 ESA was just given on 8/9 at OP unit, no need for esa here.

## 2022-02-12 NOTE — Progress Notes (Addendum)
PT Cancellation Note   Patient Details Name: Mallory Chavez MRN: [id number] DOB: March 13, 1936     Cancelled Treatment: Pt at dialysis. Will try again in afternoon. UPDATE: pt back from dialysis. Pt refusing physical therapy stating she wants to be left alone.    Donna Bernard, PT    Donna Bernard 02/12/2022

## 2022-02-12 NOTE — Progress Notes (Addendum)
Received patient in bed to unit.  Alert and oriented.  Informed consent signed and in  chart.   Treatment initiated: 0800 Treatment completed: 1150  Patient tolerated well. BFR decreased to 350 d/t elevated arterial pressures. Ended treatment 35 minutes early d/t air in blood lines.  Transported back to the room  alert, without acute distress.  Hand-off given to patient's nurse.   Access used: fistula right arm  Access issues: elevated arterial pressures   Total UF removed: 1.9 liters Medication(s) given: none Post HD VS: 91/43 MAP 58 HR 61 Sat 92% room air  RR 12 Temp oral 98.2 Post HD weight: 62.9 kg bed wt   [facility]

## 2022-02-12 NOTE — TOC Progression Note (Addendum)
Transition of Care [facility]) - Progression Note    Patient Details  Name: Mallory Chavez MRN: [id number] Date of Birth: [birth year]  [facility] CM/SW Contact  Jacalyn Lefevre Edson Snowball, RN Phone Number: 02/12/2022, 10:30 AM  Clinical Narrative:     Patient currently in dialysis . Per progression plan to discharge today after dialysis with HHPT.   NCM spoke to daughter Golden Pop [phone] via phone and discussed home health PT.   Referral to [facility] , awaiting call back   Tommi Rumps with [facility] accepted referral   Expected Discharge Plan: Home/Self Care    Expected Discharge Plan and Services Expected Discharge Plan: Home/Self Care   Discharge Planning Services: CM Consult     Expected Discharge Date: 02/11/22                                     Social Determinants of Health (SDOH) Interventions    Readmission Risk Interventions     No data to display

## 2022-02-13 ENCOUNTER — Inpatient Hospital Stay (HOSPITAL_COMMUNITY): Payer: Medicare Other

## 2022-02-13 ENCOUNTER — Observation Stay (HOSPITAL_COMMUNITY): Payer: Medicare Other

## 2022-02-13 DIAGNOSIS — K567 Ileus, unspecified: Secondary | ICD-10-CM | POA: Diagnosis present

## 2022-02-13 DIAGNOSIS — I5042 Chronic combined systolic (congestive) and diastolic (congestive) heart failure: Secondary | ICD-10-CM | POA: Diagnosis present

## 2022-02-13 DIAGNOSIS — K5641 Fecal impaction: Secondary | ICD-10-CM | POA: Diagnosis present

## 2022-02-13 DIAGNOSIS — Z992 Dependence on renal dialysis: Secondary | ICD-10-CM | POA: Diagnosis not present

## 2022-02-13 DIAGNOSIS — N186 End stage renal disease: Secondary | ICD-10-CM | POA: Diagnosis present

## 2022-02-13 DIAGNOSIS — E876 Hypokalemia: Secondary | ICD-10-CM | POA: Diagnosis present

## 2022-02-13 DIAGNOSIS — I132 Hypertensive heart and chronic kidney disease with heart failure and with stage 5 chronic kidney disease, or end stage renal disease: Secondary | ICD-10-CM | POA: Diagnosis present

## 2022-02-13 DIAGNOSIS — J9611 Chronic respiratory failure with hypoxia: Secondary | ICD-10-CM | POA: Diagnosis present

## 2022-02-13 DIAGNOSIS — D631 Anemia in chronic kidney disease: Secondary | ICD-10-CM | POA: Diagnosis present

## 2022-02-13 DIAGNOSIS — M898X9 Other specified disorders of bone, unspecified site: Secondary | ICD-10-CM | POA: Diagnosis present

## 2022-02-13 DIAGNOSIS — R159 Full incontinence of feces: Secondary | ICD-10-CM | POA: Diagnosis present

## 2022-02-13 DIAGNOSIS — R197 Diarrhea, unspecified: Secondary | ICD-10-CM | POA: Diagnosis present

## 2022-02-13 DIAGNOSIS — J449 Chronic obstructive pulmonary disease, unspecified: Secondary | ICD-10-CM | POA: Diagnosis present

## 2022-02-13 DIAGNOSIS — M109 Gout, unspecified: Secondary | ICD-10-CM | POA: Diagnosis present

## 2022-02-13 DIAGNOSIS — E785 Hyperlipidemia, unspecified: Secondary | ICD-10-CM | POA: Diagnosis present

## 2022-02-13 DIAGNOSIS — E892 Postprocedural hypoparathyroidism: Secondary | ICD-10-CM | POA: Diagnosis present

## 2022-02-13 LAB — COMPREHENSIVE METABOLIC PANEL
ALT: 15 U/L (ref 0–44)
AST: 30 U/L (ref 15–41)
Albumin: 3.1 g/dL — ABNORMAL LOW (ref 3.5–5.0)
Alkaline Phosphatase: 403 U/L — ABNORMAL HIGH (ref 38–126)
Anion gap: 14 (ref 5–15)
BUN: 18 mg/dL (ref 8–23)
CO2: 33 mmol/L — ABNORMAL HIGH (ref 22–32)
Calcium: 10 mg/dL (ref 8.9–10.3)
Chloride: 90 mmol/L — ABNORMAL LOW (ref 98–111)
Creatinine, Ser: 5.91 mg/dL — ABNORMAL HIGH (ref 0.44–1.00)
GFR, Estimated: 7 mL/min — ABNORMAL LOW (ref >60)
Glucose, Bld: 96 mg/dL (ref 70–99)
Potassium: 3.3 mmol/L — ABNORMAL LOW (ref 3.5–5.1)
Sodium: 137 mmol/L (ref 135–145)
Total Bilirubin: 0.4 mg/dL (ref 0.3–1.2)
Total Protein: 7.1 g/dL (ref 6.5–8.1)

## 2022-02-13 LAB — CBC
HCT: 28.7 % — ABNORMAL LOW (ref 36.0–46.0)
Hemoglobin: 9.4 g/dL — ABNORMAL LOW (ref 12.0–15.0)
MCH: 33.3 pg (ref 26.0–34.0)
MCHC: 32.8 g/dL (ref 30.0–36.0)
MCV: 101.8 fL — ABNORMAL HIGH (ref 80.0–100.0)
Platelets: 226 10*3/uL (ref 150–400)
RBC: 2.82 MIL/uL — ABNORMAL LOW (ref 3.87–5.11)
RDW: 16.6 % — ABNORMAL HIGH (ref 11.5–15.5)
WBC: 10.6 10*3/uL — ABNORMAL HIGH (ref 4.0–10.5)
nRBC: 0 % (ref 0.0–0.2)

## 2022-02-13 LAB — MAGNESIUM: Magnesium: 2.9 mg/dL — ABNORMAL HIGH (ref 1.7–2.4)

## 2022-02-13 LAB — PHOSPHORUS: Phosphorus: 1.4 mg/dL — ABNORMAL LOW (ref 2.5–4.6)

## 2022-02-13 MED ORDER — CALCIUM CARBONATE 1250 (500 CA) MG PO TABS
3.0000 | ORAL_TABLET | Freq: Three times a day (TID) | ORAL | Status: DC
  Administered 2022-02-13 (×2): 3750 mg via ORAL
  Filled 2022-02-13 (×2): qty 3

## 2022-02-13 MED ORDER — POLYETHYLENE GLYCOL 3350 17 G PO PACK
17.0000 g | PACK | Freq: Every day | ORAL | Status: DC
  Administered 2022-02-13 – 2022-02-15 (×3): 17 g via ORAL
  Filled 2022-02-13 (×4): qty 1

## 2022-02-13 MED ORDER — MILK AND MOLASSES ENEMA
1.0000 | Freq: Once | RECTAL | Status: DC
Start: 2022-02-13 — End: 2022-02-16
  Filled 2022-02-13: qty 240

## 2022-02-13 MED ORDER — BISACODYL 5 MG PO TBEC
10.0000 mg | DELAYED_RELEASE_TABLET | Freq: Every day | ORAL | Status: AC
  Administered 2022-02-13 – 2022-02-15 (×3): 10 mg via ORAL
  Filled 2022-02-13 (×3): qty 2

## 2022-02-13 MED ORDER — SORBITOL 70 % SOLN
300.0000 mL | TOPICAL_OIL | Freq: Once | ORAL | Status: DC
  Filled 2022-02-13: qty 90

## 2022-02-13 NOTE — Progress Notes (Signed)
[facility] Progress Note   Subjective:   Patient seen and examined in room today. She reports being very frustrated, wants to go home. She does not want any more enemas given fecal incontinence.  Tolerated HD yesterday with net UF of 1.9 L.  Objective Vitals:   02/12/22 2223 02/13/22 0401 02/13/22 0840 02/13/22 0857  BP: (!) 113/57 (!) 151/50  (!) 109/45  Pulse: 74 80  81  Resp: '16 17  18  '$ Temp: 98.2 F (36.8 C) 98.6 F (37 C)  98.4 F (36.9 C)  TempSrc: Oral Oral  Oral  SpO2: 92% 90% 94% 92%  Weight:      Height:       Physical Exam General: Alert female in NAD Heart: RRR, no murmurs, rubs or gallops Lungs: CTA bilaterally without wheezing, rhonchi or rales Abdomen: Soft, non-distended +BS Extremities: No edema b/l lower extremities Dialysis Access:  LUE AVF + bruit  Additional Objective Labs: Basic Metabolic Panel: Recent Labs  Lab 02/11/22 0547 02/11/22 1648 02/12/22 0830  NA 133* 134* 133*  K 3.7 4.2 3.8  CL 92* 92* 93*  CO2 '26 27 28  '$ GLUCOSE 99 131* 95  BUN 22 28* 29*  CREATININE 6.47* 7.28* 8.43*  CALCIUM 7.6* 7.7* 9.0  PHOS 2.5 1.8* 2.1*   Liver Function Tests: Recent Labs  Lab 02/08/22 2000 02/09/22 0333 02/10/22 1042 02/11/22 0547 02/11/22 1648 02/12/22 0830  AST 49* 45* 43*  --   --   --   ALT '15 16 15  '$ --   --   --   ALKPHOS 530* 486* 429*  --   --   --   BILITOT 0.5 0.5 0.4  --   --   --   PROT 6.9 6.6 5.9*  --   --   --   ALBUMIN 3.1* 2.9* 2.7*  2.7* 2.6* 2.5* 2.6*   Recent Labs  Lab 02/08/22 2000  LIPASE 30   CBC: Recent Labs  Lab 02/08/22 2000 02/08/22 2010 02/09/22 0333 02/10/22 0713 02/12/22 0830  WBC 19.3*  --  14.7* 13.8* 8.2  NEUTROABS 16.2*  --   --   --   --   HGB 9.0*   < > 8.8* 10.5* 9.1*  HCT 27.9*   < > 27.9* 31.3* 28.2*  MCV 104.1*  --  103.7* 100.6* 101.8*  PLT 204  --  217 193 247   < > = values in this interval not displayed.   Blood Culture No results found for: "SDES", "SPECREQUEST",  "CULT", "REPTSTATUS"  Cardiac Enzymes: No results for input(s): "CKTOTAL", "CKMB", "CKMBINDEX", "TROPONINI" in the last 168 hours. CBG: No results for input(s): "GLUCAP" in the last 168 hours. Iron Studies: No results for input(s): "IRON", "TIBC", "TRANSFERRIN", "FERRITIN" in the last 72 hours. '@lablastinr3'$ @ Studies/Results: DG Abd 1 View  Result Date: 02/13/2022 CLINICAL DATA:  Abdominal pain. EXAM: ABDOMEN - 1 VIEW COMPARISON:  02/12/2022 FINDINGS: Similar ileus pattern involving small bowel and colon without overt small bowel obstruction. No signs of free intraperitoneal air. Probable decrease in stool burden in the distal colon. IMPRESSION: Similar ileus pattern with probable decrease in stool burden in the distal colon. Electronically Signed   By: Aletta Edouard M.D.   On: 02/13/2022 08:28   DG Abd 1 View  Result Date: 02/12/2022 CLINICAL DATA:  Intractable nausea and vomiting. EXAM: ABDOMEN - 1 VIEW COMPARISON:  None Available. FINDINGS: Stool is seen in the majority of the colon. No small bowel dilatation. IMPRESSION:  Stool in the majority of the colon is indicative of constipation. Electronically Signed   By: Lorin Picket M.D.   On: 02/12/2022 14:53   Medications:   aspirin EC  81 mg Oral QPC supper   bisacodyl  10 mg Oral Daily   calcitRIOL  2 mcg Oral Q M,W,F   calcium carbonate  4 tablet Oral TID BM   Chlorhexidine Gluconate Cloth  6 each Topical Q0600   Chlorhexidine Gluconate Cloth  6 each Topical Q0600   heparin  5,000 Units Subcutaneous Q8H   lactulose  20 g Oral TID   milk and molasses  1 enema Rectal Once   mometasone-formoterol  2 puff Inhalation BID   polyethylene glycol  17 g Oral Daily   polyvinyl alcohol  1 drop Both Eyes QHS   sodium chloride flush  3 mL Intravenous Q12H    Dialysis Orders:  MWF [facility] 3h 82mn  4090/1.5  64.8kg  2/2 bath  P2  Heparin 4000  LUA AVF - last hep B labs here: 01/25/22 - last HD 8/9 post wt 65.7kg - last Hb 9.9 on 8/9 -  mircera 30 q 2, last 8/9 - iron sucrose 50 weekly, last 8/9 - calcitriol 2.0 mcg po tiw  Assessment/Plan: Acute diarrhea - post laxatives for constipation. CT here showing constipation/impaction, got enema and lactulose. Improved, management per primary MD.  Decreased stool burden on AXR today Hypocalcemia - Presented with Ca++ 6-7 range here which is low, fortunately is not symptomatic. SP recent parathyroidectomy and was dc'd w/ Ca++ 7.5- 8.5 range after 4-5 days of adjustments for "hungry bones syndrome" At home pt states she was taking 4 CaCO3 tabs only DAILY when it should have been QID. Have resumed CaCO3 at slightly lower dose of 4 po TID. It is also supposed to be given between meals so as not to bind phos. We also gave 4 gm IV Ca gluconate on 8/12 and continued her po calcitriol at 2.0 ug po tiw w/ HD (and dc'd her daily home dose of 0.5 ug). Calcium improved, decreased caco3 to 3 tabs tid Hypophosphatemia - phos was < 2. We gave her 30 mmole IV Na phos. Improved to 2.1 8/14. Do not resume renvela at discharge.  ESRD - on HD MWF which we will continue here Volume- does not appear volume overloaded. Uf as tolerated HTN - BP's low normal. Not on any antihypertensive meds.  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 As above.  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 ESA was just given on 8/9 at OP unit, no need for esa yet.

## 2022-02-13 NOTE — Progress Notes (Addendum)
Physical Therapy Treatment Patient Details Name: Mallory Chavez MRN: [id number] DOB: 1935-08-07 Today's Date: 02/13/2022   History of Present Illness Mallory Chavez is a pleasant 86 y.o. female with medical history significant for COPD, chronic hypoxic respiratory failure, ESRD on hemodialysis, parathyroidectomy 2 weeks ago, and recent constipation, now presenting to the emergency department with liquid stool and fecal incontinence.  Patient has been taking oxycodone since her recent surgery, has also been on phosphate binder, developed constipation, attempted to treat this with a suppository, and now for the past 2 days has had fecal incontinence with liquid stool.  She denies any abdominal or rectal pain, denies fevers or chills, and denies any chest pain, lightheadedness, or cough.  She denies melena or hematochezia.    PT Comments    Pt agitated but agreeable to physical therapy with encouragement. Pt is independent with bed mobility and transfers. Pt ambulated 125 feet with RW and supervision. Pt refused to wear gait belt and became agitated when attempting to obtain pulse oximetry reading. Pt will be discharged from acute care physical therapy at this time based on her current functional level and her unwillingness to participate (refused x 2 yesterday).  Recommendations for follow up therapy are one component of a multi-disciplinary discharge planning process, led by the attending physician.  Recommendations may be updated based on patient status, additional functional criteria and insurance authorization.  Follow Up Recommendations  No PT follow up     Assistance Recommended at Discharge Intermittent Supervision/Assistance  Patient can return home with the following A little help with walking and/or transfers;Assistance with cooking/housework;Assist for transportation;Help with stairs or ramp for entrance   Equipment Recommendations  None recommended by PT    Recommendations for  Other Services       Precautions / Restrictions Precautions Precautions: Fall Restrictions Weight Bearing Restrictions: No     Mobility  Bed Mobility Overal bed mobility: Independent                  Transfers Overall transfer level: Modified independent Equipment used: Rolling walker (2 wheels), None                    Ambulation/Gait Ambulation/Gait assistance: Supervision Gait Distance (Feet): 125 Feet Assistive device: Rolling walker (2 wheels) Gait Pattern/deviations: Step-through pattern, Trunk flexed Gait velocity: decreased     General Gait Details: Pt required cues to maintain her COG more forwards inside of RW but was not amendable to. No LOB occurred. Pt refused gait belt.   Stairs             Wheelchair Mobility    Modified Rankin (Stroke Patients Only)       Balance Overall balance assessment: Modified Independent                                          Cognition Arousal/Alertness: Awake/alert Behavior During Therapy: Agitated; refuses gait belt. Stated "now just leave me alone" once session over. Overall Cognitive Status: Within Functional Limits for tasks assessed                                          Exercises      General Comments        Pertinent Vitals/Pain Pain Assessment Pain  Assessment: 0-10 Pain Score:  (pt would not quantify) Pain Location: rectum Pain Descriptors / Indicators: Guarding, Grimacing    Home Living                          Prior Function            PT Goals (current goals can now be found in the care plan section) Acute Rehab PT Goals Patient Stated Goal: none stated PT Goal Formulation: With patient Time For Goal Achievement: 02/16/22 Potential to Achieve Goals: Good Progress towards PT goals: Goals met/education completed, patient discharged from PT    Frequency    Min 3X/week      PT Plan  (Pt is discharged from acute  care physical therapy.)    Co-evaluation              AM-PAC PT "6 Clicks" Mobility   Outcome Measure  Help needed turning from your back to your side while in a flat bed without using bedrails?: None Help needed moving from lying on your back to sitting on the side of a flat bed without using bedrails?: None Help needed moving to and from a bed to a chair (including a wheelchair)?: None Help needed standing up from a chair using your arms (e.g., wheelchair or bedside chair)?: None Help needed to walk in hospital room?: A Little Help needed climbing 3-5 steps with a railing? : A Little 6 Click Score: 22    End of Session   Activity Tolerance: Patient limited by pain Patient left: in bed;with call bell/phone within reach Nurse Communication: Mobility status;Patient requests pain meds PT Visit Diagnosis: Other abnormalities of gait and mobility (R26.89);Pain     Time: 1610-9604 PT Time Calculation (min) (ACUTE ONLY): 11 min  Charges:  $Gait Training: 8-22 mins                     Donna Bernard, PT    [facility] 02/13/2022, 11:27 AM

## 2022-02-13 NOTE — Progress Notes (Signed)
PROGRESS NOTE    Mallory Chavez  LMB:[id number] DOB: 1935-11-30 DOA: 02/08/2022 PCP: [name], MD   Brief Narrative: 86 year old female with diarrhea after taking  enema at home for constipation.  Patient had parathyroidectomy 2 weeks ago and was taking oxycodone since her surgery and developed constipation.  She denies nausea vomiting abdominal pain fever chills.  She has a history significant for ESRD on dialysis Monday Wednesday Friday chronic O2 dependent hypoxic respiratory failure, COPD.  In the ED her white count was 19,000.  CT abdomen and pelvis shows large volume of stool in the distal colon and rectum involved with perirectal inflammatory stranding and rectal wall thickening.  Manual disimpaction was attempted at the ED without success.  Assessment & Plan:   Principal Problem:   Fecal impaction (Page) Active Problems:   COPD GOLD II   Chronic respiratory failure with hypoxia (HCC)   ESRD on dialysis (HCC)   Overflow diarrhea   Hypocalcemia   #1  Severe fecal impaction-  Patient admitted with abdominal pain and multiple days of constipation prior to admission to hospital.  She had parathyroidectomy done recently and was prescribed oxycodone.  CT abdomen and pelvis shows fecal impaction with stercoral colitis. KUB 02/12/2022 with: Heavy burden of stool in the entire colon. Trial of disimpaction done 8/14 again without success Dulcolax suppository, smog enemas x2, lactulose 3 times a day have been unsuccessful in clearing her severe impaction.  Patient remains with poor p.o. intake and nausea.   repeat KUB 02/13/2022 with decreased stool burden but however it shows ileus and patient is symptomatic with nausea. We will keep her on clear liquid diet Repeat KUB in a.m. Her white count on admission 19.3 down to 8.2   No evidence of active infection.  PT recommending home health PT.  ILEUS-clear liquids repeat KUB in a.m. follow-up labs in a.m. encourage activity Out of bed  and ambulate  #2 ESRD on dialysis Monday Wednesday Friday Appreciate nephrology input  #3 hypokalemia/hypocalcemia/hypophosphatemia-resolved.      #4 chronic hypoxic respiratory failure O2 dependent stable continue home meds   Estimated body mass index is 23.8 kg/m as calculated from the following:   Height as of this encounter: '5\' 4"'$  (1.626 m).   Weight as of this encounter: 62.9 kg.  DVT prophylaxis: heparin Code Status: Full code Family Communication: Called daughter did not pick up disposition Plan:  Status is: In patient   Consultants:  Nephrology  Procedures: None Antimicrobials: None  Subjective:  Patient reports abdominal pain and nausea and decreased appetite does not want to eat anything has not had any vomiting but feels her abdomen is bigger is not passing gas  Objective: Vitals:   02/12/22 2223 02/13/22 0401 02/13/22 0840 02/13/22 0857  BP: (!) 113/57 (!) 151/50  (!) 109/45  Pulse: 74 80  81  Resp: '16 17  18  '$ Temp: 98.2 F (36.8 C) 98.6 F (37 C)  98.4 F (36.9 C)  TempSrc: Oral Oral  Oral  SpO2: 92% 90% 94% 92%  Weight:      Height:        Intake/Output Summary (Last 24 hours) at 02/13/2022 1208 Last data filed at 02/13/2022 0900 Gross per 24 hour  Intake 340 ml  Output 2 ml  Net 338 ml    Filed Weights   02/09/22 1240 02/12/22 0750 02/12/22 1204  Weight: 70.9 kg 64.4 kg 62.9 kg    Examination:  General exam: Appears in no acute distress Respiratory system: Clear  to auscultation. Respiratory effort normal. Cardiovascular system: S1 & S2 heard, RRR. No JVD, murmurs, rubs, gallops or clicks. No pedal edema. Gastrointestinal system: Abdomen is distended, soft and nontender. No organomegaly or masses felt.  Diminished bowel sounds heard. Central nervous system: Alert and oriented. No focal neurological deficits. Extremities: Trace bilateral edema Skin: No rashes, lesions or ulcers Psychiatry: Judgement and insight appear normal. Mood & affect  appropriate.     Data Reviewed: I have personally reviewed following labs and imaging studies  CBC: Recent Labs  Lab 02/08/22 2000 02/08/22 2010 02/09/22 0333 02/10/22 0713 02/12/22 0830  WBC 19.3*  --  14.7* 13.8* 8.2  NEUTROABS 16.2*  --   --   --   --   HGB 9.0* 9.2* 8.8* 10.5* 9.1*  HCT 27.9* 27.0* 27.9* 31.3* 28.2*  MCV 104.1*  --  103.7* 100.6* 101.8*  PLT 204  --  217 193 591    Basic Metabolic Panel: Recent Labs  Lab 02/08/22 2000 02/08/22 2010 02/09/22 0333 02/10/22 0713 02/10/22 1042 02/11/22 0547 02/11/22 1648 02/12/22 0830  NA 136   < > 135  --  132*  134* 133* 134* 133*  K 4.0   < > 3.3*  --  2.9*  2.8* 3.7 4.2 3.8  CL 94*   < > 96*  --  90*  90* 92* 92* 93*  CO2 28  --  28  --  '26  28 26 27 28  '$ GLUCOSE 135*   < > 115*  --  140*  141* 99 131* 95  BUN 23   < > 26*  --  '16  15 22 '$ 28* 29*  CREATININE 6.71*   < > 7.27*  --  5.30*  5.18* 6.47* 7.28* 8.43*  CALCIUM 6.6*  --  6.2*  --  6.5*  6.7* 7.6* 7.7* 9.0  MG 3.3*  --   --   --  2.8*  --   --   --   PHOS  --    < > 1.6* 4.0 3.2 2.5 1.8* 2.1*   < > = values in this interval not displayed.    GFR: Estimated Creatinine Clearance: 4.4 mL/min (A) (by C-G formula based on SCr of 8.43 mg/dL (H)). Liver Function Tests: Recent Labs  Lab 02/08/22 2000 02/09/22 0333 02/10/22 1042 02/11/22 0547 02/11/22 1648 02/12/22 0830  AST 49* 45* 43*  --   --   --   ALT '15 16 15  '$ --   --   --   ALKPHOS 530* 486* 429*  --   --   --   BILITOT 0.5 0.5 0.4  --   --   --   PROT 6.9 6.6 5.9*  --   --   --   ALBUMIN 3.1* 2.9* 2.7*  2.7* 2.6* 2.5* 2.6*    Recent Labs  Lab 02/08/22 2000  LIPASE 30    No results for input(s): "AMMONIA" in the last 168 hours. Coagulation Profile: No results for input(s): "INR", "PROTIME" in the last 168 hours. Cardiac Enzymes: No results for input(s): "CKTOTAL", "CKMB", "CKMBINDEX", "TROPONINI" in the last 168 hours. BNP (last 3 results) No results for input(s): "PROBNP"  in the last 8760 hours. HbA1C: No results for input(s): "HGBA1C" in the last 72 hours. CBG: No results for input(s): "GLUCAP" in the last 168 hours. Lipid Profile: No results for input(s): "CHOL", "HDL", "LDLCALC", "TRIG", "CHOLHDL", "LDLDIRECT" in the last 72 hours. Thyroid Function Tests: No results for input(s): "TSH", "  T4TOTAL", "FREET4", "T3FREE", "THYROIDAB" in the last 72 hours. Anemia Panel: No results for input(s): "VITAMINB12", "FOLATE", "FERRITIN", "TIBC", "IRON", "RETICCTPCT" in the last 72 hours. Sepsis Labs: No results for input(s): "PROCALCITON", "LATICACIDVEN" in the last 168 hours.  Recent Results (from the past 240 hour(s))  C Difficile Quick Screen w PCR reflex     Status: None   Collection Time: 02/08/22  7:25 PM   Specimen: STOOL  Result Value Ref Range Status   C Diff antigen NEGATIVE NEGATIVE Final   C Diff toxin NEGATIVE NEGATIVE Final   C Diff interpretation No C. difficile detected.  Final    Comment: Performed at [facility], [place] [address], [place], [place] [address]  Gastrointestinal Panel by PCR , Stool     Status: None   Collection Time: 02/08/22  7:25 PM   Specimen: STOOL  Result Value Ref Range Status   Campylobacter species NOT DETECTED NOT DETECTED Final   Plesimonas shigelloides NOT DETECTED NOT DETECTED Final   Salmonella species NOT DETECTED NOT DETECTED Final   Yersinia enterocolitica NOT DETECTED NOT DETECTED Final   Vibrio species NOT DETECTED NOT DETECTED Final   Vibrio cholerae NOT DETECTED NOT DETECTED Final   Enteroaggregative E coli (EAEC) NOT DETECTED NOT DETECTED Final   Enteropathogenic E coli (EPEC) NOT DETECTED NOT DETECTED Final   Enterotoxigenic E coli (ETEC) NOT DETECTED NOT DETECTED Final   Shiga like toxin producing E coli (STEC) NOT DETECTED NOT DETECTED Final   Shigella/Enteroinvasive E coli (EIEC) NOT DETECTED NOT DETECTED Final   Cryptosporidium NOT DETECTED NOT DETECTED Final   Cyclospora cayetanensis NOT  DETECTED NOT DETECTED Final   Entamoeba histolytica NOT DETECTED NOT DETECTED Final   Giardia lamblia NOT DETECTED NOT DETECTED Final   Adenovirus F40/41 NOT DETECTED NOT DETECTED Final   Astrovirus NOT DETECTED NOT DETECTED Final   Norovirus GI/GII NOT DETECTED NOT DETECTED Final   Rotavirus A NOT DETECTED NOT DETECTED Final   Sapovirus (I, II, IV, and V) NOT DETECTED NOT DETECTED Final    Comment: Performed at [facility], [address] [place], [place] [address]         Radiology Studies: DG Abd 1 View  Result Date: 02/13/2022 CLINICAL DATA:  Abdominal pain. EXAM: ABDOMEN - 1 VIEW COMPARISON:  02/12/2022 FINDINGS: Similar ileus pattern involving small bowel and colon without overt small bowel obstruction. No signs of free intraperitoneal air. Probable decrease in stool burden in the distal colon. IMPRESSION: Similar ileus pattern with probable decrease in stool burden in the distal colon. Electronically Signed   By: Aletta Edouard M.D.   On: 02/13/2022 08:28   DG Abd 1 View  Result Date: 02/12/2022 CLINICAL DATA:  Intractable nausea and vomiting. EXAM: ABDOMEN - 1 VIEW COMPARISON:  None Available. FINDINGS: Stool is seen in the majority of the colon. No small bowel dilatation. IMPRESSION: Stool in the majority of the colon is indicative of constipation. Electronically Signed   By: Lorin Picket M.D.   On: 02/12/2022 14:53        Scheduled Meds:  aspirin EC  81 mg Oral QPC supper   bisacodyl  10 mg Oral Daily   calcitRIOL  2 mcg Oral Q M,W,F   calcium carbonate  3 tablet Oral TID BM   Chlorhexidine Gluconate Cloth  6 each Topical Q0600   Chlorhexidine Gluconate Cloth  6 each Topical Q0600   heparin  5,000 Units Subcutaneous Q8H   lactulose  20 g Oral  TID   milk and molasses  1 enema Rectal Once   mometasone-formoterol  2 puff Inhalation BID   polyethylene glycol  17 g Oral Daily   polyvinyl alcohol  1 drop Both Eyes QHS   sodium chloride flush  3 mL  Intravenous Q12H   Continuous Infusions:     LOS: 0 days    Time spent: 35 minutes  Georgette Shell, MD 02/13/2022, 12:08 PM

## 2022-02-14 ENCOUNTER — Inpatient Hospital Stay (HOSPITAL_COMMUNITY): Payer: Medicare Other

## 2022-02-14 DIAGNOSIS — K5641 Fecal impaction: Secondary | ICD-10-CM | POA: Diagnosis not present

## 2022-02-14 LAB — RENAL FUNCTION PANEL
Albumin: 2.8 g/dL — ABNORMAL LOW (ref 3.5–5.0)
Anion gap: 13 (ref 5–15)
BUN: 21 mg/dL (ref 8–23)
CO2: 28 mmol/L (ref 22–32)
Calcium: 9.8 mg/dL (ref 8.9–10.3)
Chloride: 93 mmol/L — ABNORMAL LOW (ref 98–111)
Creatinine, Ser: 7.1 mg/dL — ABNORMAL HIGH (ref 0.44–1.00)
GFR, Estimated: 5 mL/min — ABNORMAL LOW (ref >60)
Glucose, Bld: 95 mg/dL (ref 70–99)
Phosphorus: 1.7 mg/dL — ABNORMAL LOW (ref 2.5–4.6)
Potassium: 3.8 mmol/L (ref 3.5–5.1)
Sodium: 134 mmol/L — ABNORMAL LOW (ref 135–145)

## 2022-02-14 LAB — CBC
HCT: 28.2 % — ABNORMAL LOW (ref 36.0–46.0)
Hemoglobin: 9 g/dL — ABNORMAL LOW (ref 12.0–15.0)
MCH: 33.1 pg (ref 26.0–34.0)
MCHC: 31.9 g/dL (ref 30.0–36.0)
MCV: 103.7 fL — ABNORMAL HIGH (ref 80.0–100.0)
Platelets: 227 10*3/uL (ref 150–400)
RBC: 2.72 MIL/uL — ABNORMAL LOW (ref 3.87–5.11)
RDW: 16.6 % — ABNORMAL HIGH (ref 11.5–15.5)
WBC: 10.3 10*3/uL (ref 4.0–10.5)
nRBC: 0 % (ref 0.0–0.2)

## 2022-02-14 MED ORDER — ANTICOAGULANT SODIUM CITRATE 4% (200MG/5ML) IV SOLN
5.0000 mL | Status: DC | PRN
  Filled 2022-02-14: qty 5

## 2022-02-14 MED ORDER — LIDOCAINE-PRILOCAINE 2.5-2.5 % EX CREA: 1.0000 | TOPICAL_CREAM | CUTANEOUS | Status: DC | PRN

## 2022-02-14 MED ORDER — PENTAFLUOROPROP-TETRAFLUOROETH EX AERO
INHALATION_SPRAY | CUTANEOUS | Status: AC
  Filled 2022-02-14: qty 30

## 2022-02-14 MED ORDER — LIDOCAINE HCL (PF) 1 % IJ SOLN: 5.0000 mL | INTRAMUSCULAR | Status: DC | PRN

## 2022-02-14 MED ORDER — DEXTROSE 5 % IV SOLN
30.0000 mmol | Freq: Once | INTRAVENOUS | Status: AC
  Administered 2022-02-14: 30 mmol via INTRAVENOUS
  Filled 2022-02-14 (×2): qty 10

## 2022-02-14 MED ORDER — LACTULOSE 10 GM/15ML PO SOLN
20.0000 g | Freq: Every day | ORAL | Status: DC
  Administered 2022-02-14 – 2022-02-15 (×2): 20 g via ORAL
  Filled 2022-02-14 (×3): qty 30

## 2022-02-14 MED ORDER — HEPARIN SODIUM (PORCINE) 1000 UNIT/ML DIALYSIS
1000.0000 [IU] | INTRAMUSCULAR | Status: DC | PRN
  Filled 2022-02-14: qty 1

## 2022-02-14 MED ORDER — PENTAFLUOROPROP-TETRAFLUOROETH EX AERO: 1.0000 | INHALATION_SPRAY | CUTANEOUS | Status: DC | PRN

## 2022-02-14 MED ORDER — ALTEPLASE 2 MG IJ SOLR: 2.0000 mg | Freq: Once | INTRAMUSCULAR | Status: DC | PRN

## 2022-02-14 NOTE — Progress Notes (Signed)
Treatment paused due to clotting alarm. Cartridge changed and treatment started at [phone]

## 2022-02-14 NOTE — Progress Notes (Signed)
PROGRESS NOTE   Mallory Chavez  HAL:[id number] DOB: 1935/08/18 DOA: 02/08/2022 PCP: [name], MD  Brief Narrative:  86 year old black female known HFrEF/HFpEF ESRD MWF since 07/2018 Prior left renal and adnexal lesions COPD Gold stage II (quit smoking 1990 Reflux Gout Underwent parathyroidectomy 01/25/2022 with autotransplant to right forearm-nephrology managed hypocalcemia  Presented home with liquid stool fecal incontinence (has been taking oxycodone since her recent surgery, also had been on phosphate binders CT abdomen pelvis = large volume stool distal colon rectal vault perirectal inflammatory stranding and manual disimpaction was attempted but unsuccessful 8/15-DG x-ray shows possible ileus  Hospital-Problem based course  Ileus/fecal incontinence/diarrhea secondary to overflow around area of fecal impaction CT abdomen pelvis = fecal impaction with stercoral colitis Repeat KUB 8/15 equal decrease stool burden + ileus--- Milk and molasses enema 8/15 and has been placed on lactulose 3 times daily which I will cut back to once daily (unclear if the diarrhea is from all of the laxatives or is from the impaction) Can continue MiraLAX daily and Dulcolax daily for 10 Continue clear liquid diet and try to graduate today White count was slightly up to 10 yesterday so we will repeat Would repeat CT scan abdomen pelvis in a.m. if continues to have nausea vomiting Recent parathyroidectomy Severe hypocalcemia on admission and hypophosphatemia Very hypocalcemic on admission which is now resolved--unclear if hypocalcemia predisposed to ileus Continuing calcium carbonate 3 times daily meals I have repeated Neutra-Phos 30 mmol today and we will repeat labs in the morning Holding Renvela at discharge ESRD MWF Ultrafiltration and replacement of electrolytes as per renal-appreciate input Anemia of renal disease Hemoglobin stable in the 8-10 range-no further work-up at this time ESA as  well as replacement as per nephrology  DVT prophylaxis: Heparin Code Status: Full Family Communication: None Disposition:  Status is: Inpatient Remains inpatient appropriate because: She is not tolerating a full diet and will need to be on full diet prior to discharge  Expect can discharge home as is ambulating in the unit   Consultants:  Renal  Procedures: None  Antimicrobials: No   Subjective: Awake coherent seen on dialysis unit Does not desat if I take her off of the oxygen No chest pain no fever Tells me that she had diarrhea "all night" She is mainly tolerating chicken soup and seems to like that-I have encouraged her to try to eat I have encouraged her to let nursing staff know if she has any further nausea and we will keep her on liquid diet   Objective: Vitals:   02/13/22 1454 02/13/22 2049 02/13/22 2107 02/14/22 0431  BP: (!) 126/54  (!) 121/54 137/62  Pulse: 73  74 68  Resp: 17  16   Temp: 97.7 F (36.5 C)  98.1 F (36.7 C) 98.6 F (37 C)  TempSrc: Oral  Oral   SpO2: 94% 94%  96%  Weight:      Height:        Intake/Output Summary (Last 24 hours) at 02/14/2022 0716 Last data filed at 02/13/2022 2130 Gross per 24 hour  Intake 540 ml  Output --  Net 540 ml   Filed Weights   02/09/22 1240 02/12/22 0750 02/12/22 1204  Weight: 70.9 kg 64.4 kg 62.9 kg    Examination:  Awake coherent black female no distress Arcus and less no JVD Fistula in left arm S1-S2 no murmur Abdomen slightly distended no rebound no guarding No lower extremity edema ROM intact Power 5/5 no focal deficit Chest is  clear  Data Reviewed: personally reviewed   CBC    Component Value Date/Time   WBC 10.6 (H) 02/13/2022 1254   RBC 2.82 (L) 02/13/2022 1254   HGB 9.4 (L) 02/13/2022 1254   HGB 11.6 07/06/2021 1225   HCT 28.7 (L) 02/13/2022 1254   HCT 33.0 (L) 07/06/2021 1225   PLT 226 02/13/2022 1254   PLT 196 07/06/2021 1225   MCV 101.8 (H) 02/13/2022 1254   MCV 96  07/06/2021 1225   MCH 33.3 02/13/2022 1254   MCHC 32.8 02/13/2022 1254   RDW 16.6 (H) 02/13/2022 1254   RDW 13.1 07/06/2021 1225   LYMPHSABS 1.3 02/08/2022 2000   LYMPHSABS 1.5 07/06/2021 1225   MONOABS 1.5 (H) 02/08/2022 2000   EOSABS 0.1 02/08/2022 2000   EOSABS 0.1 07/06/2021 1225   BASOSABS 0.1 02/08/2022 2000   BASOSABS 0.0 07/06/2021 1225      Latest Ref Rng & Units 02/13/2022   12:54 PM 02/12/2022    8:30 AM 02/11/2022    4:48 PM  CMP  Glucose 70 - 99 mg/dL 96  95  131   BUN 8 - 23 mg/dL '18  29  28   '$ Creatinine 0.44 - 1.00 mg/dL 5.91  8.43  7.28   Sodium 135 - 145 mmol/L 137  133  134   Potassium 3.5 - 5.1 mmol/L 3.3  3.8  4.2   Chloride 98 - 111 mmol/L 90  93  92   CO2 22 - 32 mmol/L 33  28  27   Calcium 8.9 - 10.3 mg/dL 10.0  9.0  7.7   Total Protein 6.5 - 8.1 g/dL 7.1     Total Bilirubin 0.3 - 1.2 mg/dL 0.4     Alkaline Phos 38 - 126 U/L 403     AST 15 - 41 U/L 30     ALT 0 - 44 U/L 15        Radiology Studies: DG Abd 1 View  Result Date: 02/13/2022 CLINICAL DATA:  Abdominal pain. EXAM: ABDOMEN - 1 VIEW COMPARISON:  02/12/2022 FINDINGS: Similar ileus pattern involving small bowel and colon without overt small bowel obstruction. No signs of free intraperitoneal air. Probable decrease in stool burden in the distal colon. IMPRESSION: Similar ileus pattern with probable decrease in stool burden in the distal colon. Electronically Signed   By: Aletta Edouard M.D.   On: 02/13/2022 08:28   DG Abd 1 View  Result Date: 02/12/2022 CLINICAL DATA:  Intractable nausea and vomiting. EXAM: ABDOMEN - 1 VIEW COMPARISON:  None Available. FINDINGS: Stool is seen in the majority of the colon. No small bowel dilatation. IMPRESSION: Stool in the majority of the colon is indicative of constipation. Electronically Signed   By: Lorin Picket M.D.   On: 02/12/2022 14:53     Scheduled Meds:  aspirin EC  81 mg Oral QPC supper   bisacodyl  10 mg Oral Daily   calcitRIOL  2 mcg Oral Q  M,W,F   calcium carbonate  3 tablet Oral TID BM   Chlorhexidine Gluconate Cloth  6 each Topical Q0600   Chlorhexidine Gluconate Cloth  6 each Topical Q0600   heparin  5,000 Units Subcutaneous Q8H   lactulose  20 g Oral TID   milk and molasses  1 enema Rectal Once   mometasone-formoterol  2 puff Inhalation BID   polyethylene glycol  17 g Oral Daily   polyvinyl alcohol  1 drop Both Eyes QHS   sodium chloride flush  3 mL Intravenous Q12H   Continuous Infusions:   LOS: 1 day   Time spent: [place], MD [facility] To contact the attending provider between 7A-7P or the covering provider during after hours 7P-7A, please log into the web site [email or web address] and access using universal [place] password for that web site. If you do not have the password, please call the hospital operator.  02/14/2022, 7:16 AM

## 2022-02-14 NOTE — Progress Notes (Signed)
[facility] Progress Note   Subjective:   Patient seen and examined on dialysis. Tolerating treatment, no complaints. UFG 2L. VSS  Objective Vitals:   02/14/22 0802 02/14/22 0830 02/14/22 0900 02/14/22 0930  BP: (!) 131/42 (!) 113/43 (!) 108/40 135/61  Pulse: (!) 59 62 70 73  Resp: '14 19 10 '$ (!) 23  Temp:      TempSrc:      SpO2: 97% 94% 93% 94%  Weight:      Height:       Physical Exam General: Alert female in NAD Heart: RRR, no murmurs, rubs or gallops Lungs: CTA bilaterally without wheezing, rhonchi or rales Abdomen: Soft, non-distended +BS Extremities: No edema b/l lower extremities Dialysis Access:  LUE AVF + bruit  Additional Objective Labs: Basic Metabolic Panel: Recent Labs  Lab 02/12/22 0830 02/13/22 1254 02/14/22 0800  NA 133* 137 134*  K 3.8 3.3* 3.8  CL 93* 90* 93*  CO2 28 33* 28  GLUCOSE 95 96 95  BUN 29* 18 21  CREATININE 8.43* 5.91* 7.10*  CALCIUM 9.0 10.0 9.8  PHOS 2.1* 1.4* 1.7*   Liver Function Tests: Recent Labs  Lab 02/09/22 0333 02/10/22 1042 02/11/22 0547 02/12/22 0830 02/13/22 1254 02/14/22 0800  AST 45* 43*  --   --  30  --   ALT 16 15  --   --  15  --   ALKPHOS 486* 429*  --   --  403*  --   BILITOT 0.5 0.4  --   --  0.4  --   PROT 6.6 5.9*  --   --  7.1  --   ALBUMIN 2.9* 2.7*  2.7*   < > 2.6* 3.1* 2.8*   < > = values in this interval not displayed.   Recent Labs  Lab 02/08/22 2000  LIPASE 30   CBC: Recent Labs  Lab 02/08/22 2000 02/08/22 2010 02/09/22 0333 02/10/22 0713 02/12/22 0830 02/13/22 1254 02/14/22 0800  WBC 19.3*  --  14.7* 13.8* 8.2 10.6* 10.3  NEUTROABS 16.2*  --   --   --   --   --   --   HGB 9.0*   < > 8.8* 10.5* 9.1* 9.4* 9.0*  HCT 27.9*   < > 27.9* 31.3* 28.2* 28.7* 28.2*  MCV 104.1*  --  103.7* 100.6* 101.8* 101.8* 103.7*  PLT 204  --  217 193 247 226 227   < > = values in this interval not displayed.   Blood Culture No results found for: "SDES", "SPECREQUEST", "CULT",  "REPTSTATUS"  Cardiac Enzymes: No results for input(s): "CKTOTAL", "CKMB", "CKMBINDEX", "TROPONINI" in the last 168 hours. CBG: No results for input(s): "GLUCAP" in the last 168 hours. Iron Studies: No results for input(s): "IRON", "TIBC", "TRANSFERRIN", "FERRITIN" in the last 72 hours. '@lablastinr3'$ @ Studies/Results: DG Abd Portable 1V  Result Date: 02/14/2022 CLINICAL DATA:  Abdominal pain EXAM: PORTABLE ABDOMEN - 1 VIEW COMPARISON:  KUB 1 day prior FINDINGS: Previously seen gaseous distention of the bowel appears improved. There remains moderate stool in the right and left colon. There is no definite free intraperitoneal air, within the confines of supine technique. A probable small left renal stone is noted. There is no other abnormal soft tissue calcification. The lung bases are clear.  There is no acute osseous abnormality. IMPRESSION: Decreased gaseous distention of the bowel since the study from 1 day prior with persistent moderate stool in the right and left colon. Electronically Signed   By:  Valetta Mole M.D.   On: 02/14/2022 08:09   DG Abd 1 View  Result Date: 02/13/2022 CLINICAL DATA:  Abdominal pain. EXAM: ABDOMEN - 1 VIEW COMPARISON:  02/12/2022 FINDINGS: Similar ileus pattern involving small bowel and colon without overt small bowel obstruction. No signs of free intraperitoneal air. Probable decrease in stool burden in the distal colon. IMPRESSION: Similar ileus pattern with probable decrease in stool burden in the distal colon. Electronically Signed   By: Aletta Edouard M.D.   On: 02/13/2022 08:28   DG Abd 1 View  Result Date: 02/12/2022 CLINICAL DATA:  Intractable nausea and vomiting. EXAM: ABDOMEN - 1 VIEW COMPARISON:  None Available. FINDINGS: Stool is seen in the majority of the colon. No small bowel dilatation. IMPRESSION: Stool in the majority of the colon is indicative of constipation. Electronically Signed   By: Lorin Picket M.D.   On: 02/12/2022 14:53   Medications:   anticoagulant sodium citrate     sodium phosphate 30 mmol in dextrose 5 % 250 mL infusion      aspirin EC  81 mg Oral QPC supper   bisacodyl  10 mg Oral Daily   calcitRIOL  2 mcg Oral Q M,W,F   calcium carbonate  3 tablet Oral TID BM   Chlorhexidine Gluconate Cloth  6 each Topical Q0600   Chlorhexidine Gluconate Cloth  6 each Topical Q0600   heparin  5,000 Units Subcutaneous Q8H   lactulose  20 g Oral Daily   milk and molasses  1 enema Rectal Once   mometasone-formoterol  2 puff Inhalation BID   pentafluoroprop-tetrafluoroeth       polyethylene glycol  17 g Oral Daily   polyvinyl alcohol  1 drop Both Eyes QHS   sodium chloride flush  3 mL Intravenous Q12H    Dialysis Orders:  MWF [facility] 3h 72mn  4090/1.5  64.8kg  2/2 bath  P2  Heparin 4000  LUA AVF - last hep B labs here: 01/25/22 - last HD 8/9 post wt 65.7kg - last Hb 9.9 on 8/9 - mircera 30 q 2, last 8/9 - iron sucrose 50 weekly, last 8/9 - calcitriol 2.0 mcg po tiw  Assessment/Plan: Acute diarrhea secondary to overflow, fecal impaction - post laxatives for constipation. CT here showing constipation/impaction, got enema and lactulose. Improved, management per primary MD. Hypocalcemia - Presented with Ca++ 6-7 range here early in admission which is low, fortunately is not symptomatic. SP recent parathyroidectomy and was dc'd w/ Ca++ 7.5- 8.5 range after 4-5 days of adjustments for "hungry bones syndrome" At home pt states she was taking 4 CaCO3 tabs only DAILY when it should have been QID. Have resumed CaCO3 at slightly lower dose of 4 po TID. It is also supposed to be given between meals so as not to bind phos. We also gave 4 gm IV Ca gluconate on 8/12 and continued her po calcitriol at 2.0 ug po tiw w/ HD (and dc'd her daily home dose of 0.5 ug).  Corrected calcium now high ~11.6. Decreased os-cal yesterday to 3 tabds TIDAC. Will stop calcium supplements today and restart depending on calcium trend. Hypophosphatemia - phos was < 2.  We gave her 30 mmole IV Na phos. Improved to 2.1 8/14. Do not resume renvela at discharge. Agree with repletion ESRD - on HD MWF which we will continue here Volume- does not appear volume overloaded. Uf as tolerated HTN - BP's low normal. Not on any antihypertensive meds.  COPD - cont home meds  MBD ckd, status post parathyroidectomy (total) 7/27- PO4 low and Ca low. As above.  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 ESA was just given on 8/9 at OP unit, due to start esa next week

## 2022-02-14 NOTE — Progress Notes (Addendum)
Received patient in bed to unit. Alert and oriented. Informed consent signed and in chart.   Treatment initiated: 0802 Treatment completed: 1213  Patient was not able to meet UF goal today d/t BP being low. UF was turned off  alert, without acute distress.  Hand-off given to patient's nurse.   Access used: LUA Fistula Access issues: none  Total UF removed: 937m Medication(s) given: Calcitriol 263m  Post HD VS: 106/49 73 97.7 95% on RA 17 Post HD weight: 61.9kg  **edited to add HD post weight.    [facility]

## 2022-02-15 DIAGNOSIS — K5641 Fecal impaction: Secondary | ICD-10-CM | POA: Diagnosis not present

## 2022-02-15 LAB — RENAL FUNCTION PANEL
Albumin: 2.8 g/dL — ABNORMAL LOW (ref 3.5–5.0)
Anion gap: 13 (ref 5–15)
BUN: 9 mg/dL (ref 8–23)
CO2: 27 mmol/L (ref 22–32)
Calcium: 7.7 mg/dL — ABNORMAL LOW (ref 8.9–10.3)
Chloride: 95 mmol/L — ABNORMAL LOW (ref 98–111)
Creatinine, Ser: 3.97 mg/dL — ABNORMAL HIGH (ref 0.44–1.00)
GFR, Estimated: 11 mL/min — ABNORMAL LOW (ref >60)
Glucose, Bld: 83 mg/dL (ref 70–99)
Phosphorus: 3.5 mg/dL (ref 2.5–4.6)
Potassium: 3.2 mmol/L — ABNORMAL LOW (ref 3.5–5.1)
Sodium: 135 mmol/L (ref 135–145)

## 2022-02-15 LAB — CBC WITH DIFFERENTIAL/PLATELET
Abs Immature Granulocytes: 0.19 10*3/uL — ABNORMAL HIGH (ref 0.00–0.07)
Basophils Absolute: 0 10*3/uL (ref 0.0–0.1)
Basophils Relative: 0 %
Eosinophils Absolute: 0.2 10*3/uL (ref 0.0–0.5)
Eosinophils Relative: 3 %
HCT: 28.2 % — ABNORMAL LOW (ref 36.0–46.0)
Hemoglobin: 9 g/dL — ABNORMAL LOW (ref 12.0–15.0)
Immature Granulocytes: 3 %
Lymphocytes Relative: 21 %
Lymphs Abs: 1.6 10*3/uL (ref 0.7–4.0)
MCH: 32.6 pg (ref 26.0–34.0)
MCHC: 31.9 g/dL (ref 30.0–36.0)
MCV: 102.2 fL — ABNORMAL HIGH (ref 80.0–100.0)
Monocytes Absolute: 0.9 10*3/uL (ref 0.1–1.0)
Monocytes Relative: 12 %
Neutro Abs: 4.7 10*3/uL (ref 1.7–7.7)
Neutrophils Relative %: 61 %
Platelets: 210 10*3/uL (ref 150–400)
RBC: 2.76 MIL/uL — ABNORMAL LOW (ref 3.87–5.11)
RDW: 16.8 % — ABNORMAL HIGH (ref 11.5–15.5)
WBC: 7.7 10*3/uL (ref 4.0–10.5)
nRBC: 0 % (ref 0.0–0.2)

## 2022-02-15 MED ORDER — CALCIUM CARBONATE 1250 (500 CA) MG PO TABS
1.0000 | ORAL_TABLET | Freq: Three times a day (TID) | ORAL | Status: DC
  Administered 2022-02-15 – 2022-02-16 (×3): 1250 mg via ORAL
  Filled 2022-02-15 (×3): qty 1

## 2022-02-15 MED ORDER — POTASSIUM CHLORIDE CRYS ER 20 MEQ PO TBCR
20.0000 meq | EXTENDED_RELEASE_TABLET | Freq: Every day | ORAL | Status: DC
  Administered 2022-02-15 – 2022-02-16 (×2): 20 meq via ORAL
  Filled 2022-02-15 (×2): qty 1

## 2022-02-15 NOTE — Progress Notes (Signed)
[facility] Progress Note   Subjective:   Patient seen and examined in room. Eating solid food now which she is thrilled about. She is hoping to go home after HD tomorrow. No complaints currently. Drinking OJ and eating a banana--cautioned on high K foods/drinks.  Objective Vitals:   02/14/22 2015 02/15/22 0514 02/15/22 0821 02/15/22 0837  BP: (!) 107/45 (!) 104/52  121/62  Pulse: 79 77 78 74  Resp: '18 16 16 18  '$ Temp: 98 F (36.7 C) 98.6 F (37 C)  97.8 F (36.6 C)  TempSrc: Oral Oral  Oral  SpO2: 98% 95% 97% 95%  Weight:      Height:       Physical Exam General: Alert female in NAD Heart: RRR, no murmurs, rubs or gallops Lungs: CTA bilaterally without wheezing, rhonchi or rales Abdomen: Soft, non-distended +BS Extremities: No edema b/l lower extremities Dialysis Access:  LUE AVF + bruit  Additional Objective Labs: Basic Metabolic Panel: Recent Labs  Lab 02/13/22 1254 02/14/22 0800 02/15/22 0110  NA 137 134* 135  K 3.3* 3.8 3.2*  CL 90* 93* 95*  CO2 33* 28 27  GLUCOSE 96 95 83  BUN '18 21 9  '$ CREATININE 5.91* 7.10* 3.97*  CALCIUM 10.0 9.8 7.7*  PHOS 1.4* 1.7* 3.5   Liver Function Tests: Recent Labs  Lab 02/09/22 0333 02/10/22 1042 02/11/22 0547 02/13/22 1254 02/14/22 0800 02/15/22 0110  AST 45* 43*  --  30  --   --   ALT 16 15  --  15  --   --   ALKPHOS 486* 429*  --  403*  --   --   BILITOT 0.5 0.4  --  0.4  --   --   PROT 6.6 5.9*  --  7.1  --   --   ALBUMIN 2.9* 2.7*  2.7*   < > 3.1* 2.8* 2.8*   < > = values in this interval not displayed.   Recent Labs  Lab 02/08/22 2000  LIPASE 30   CBC: Recent Labs  Lab 02/08/22 2000 02/08/22 2010 02/10/22 0713 02/12/22 0830 02/13/22 1254 02/14/22 0800 02/15/22 0110  WBC 19.3*   < > 13.8* 8.2 10.6* 10.3 7.7  NEUTROABS 16.2*  --   --   --   --   --  4.7  HGB 9.0*   < > 10.5* 9.1* 9.4* 9.0* 9.0*  HCT 27.9*   < > 31.3* 28.2* 28.7* 28.2* 28.2*  MCV 104.1*   < > 100.6* 101.8* 101.8*  103.7* 102.2*  PLT 204   < > 193 247 226 227 210   < > = values in this interval not displayed.   Blood Culture No results found for: "SDES", "SPECREQUEST", "CULT", "REPTSTATUS"  Cardiac Enzymes: No results for input(s): "CKTOTAL", "CKMB", "CKMBINDEX", "TROPONINI" in the last 168 hours. CBG: No results for input(s): "GLUCAP" in the last 168 hours. Iron Studies: No results for input(s): "IRON", "TIBC", "TRANSFERRIN", "FERRITIN" in the last 72 hours. '@[email or web address]'$ @ Studies/Results: DG Abd Portable 1V  Result Date: 02/14/2022 CLINICAL DATA:  Abdominal pain EXAM: PORTABLE ABDOMEN - 1 VIEW COMPARISON:  KUB 1 day prior FINDINGS: Previously seen gaseous distention of the bowel appears improved. There remains moderate stool in the right and left colon. There is no definite free intraperitoneal air, within the confines of supine technique. A probable small left renal stone is noted. There is no other abnormal soft tissue calcification. The lung bases are clear.  There is no acute  osseous abnormality. IMPRESSION: Decreased gaseous distention of the bowel since the study from 1 day prior with persistent moderate stool in the right and left colon. Electronically Signed   By: Valetta Mole M.D.   On: 02/14/2022 08:09   Medications:    aspirin EC  81 mg Oral QPC supper   calcitRIOL  2 mcg Oral Q M,W,F   Chlorhexidine Gluconate Cloth  6 each Topical Q0600   Chlorhexidine Gluconate Cloth  6 each Topical Q0600   heparin  5,000 Units Subcutaneous Q8H   lactulose  20 g Oral Daily   milk and molasses  1 enema Rectal Once   mometasone-formoterol  2 puff Inhalation BID   polyethylene glycol  17 g Oral Daily   polyvinyl alcohol  1 drop Both Eyes QHS   sodium chloride flush  3 mL Intravenous Q12H    Dialysis Orders:  MWF [facility] 3h 22mn  4090/1.5  64.8kg  2/2 bath  P2  Heparin 4000  [facility] AVF - last hep B labs here: 01/25/22 - last HD 8/9 post wt 65.7kg - last Hb 9.9 on 8/9 - mircera 30 q 2, last 8/9 - iron  sucrose 50 weekly, last 8/9 - calcitriol 2.0 mcg po tiw  Assessment/Plan: Acute diarrhea secondary to overflow, fecal impaction - post laxatives for constipation. CT here showing constipation/impaction, got enema and lactulose. Improved, management per primary MD. Hypocalcemia - Presented with Ca++ 6-7 range here early in admission which is low, fortunately is not symptomatic. SP recent parathyroidectomy and was dc'd w/ Ca++ 7.5- 8.5 range after 4-5 days of adjustments for "hungry bones syndrome" At home pt states she was taking 4 CaCO3 tabs only DAILY when it should have been QID. Have resumed CaCO3 at slightly lower dose of 4 po TID. It is also supposed to be given between meals so as not to bind phos. We also gave 4 gm IV Ca gluconate on 8/12 and continued her po calcitriol at 2.0 ug po tiw w/ HD (and dc'd her daily home dose of 0.5 ug).  Corrected calcium was severely high ~11.6. Stopped calcium supplements 8/16. CCa++ now around 8.7. Will restart calcium carbonate 1 tab TID (in between meals) Hypophosphatemia - phos was < 2. Required repletion. Do not resume renvela at discharge. Replete prn. PO4 now 3.5 ESRD - on HD MWF which we will continue here Volume- does not appear volume overloaded. Uf as tolerated HTN - BP's low normal. Not on any antihypertensive meds.  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 As above.  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 ESA was just given on 8/9 at OP unit, due to start esa next week

## 2022-02-15 NOTE — Progress Notes (Signed)
PROGRESS NOTE   Mallory Chavez  CBJ:[id number] DOB: Feb 11, 1936 DOA: 02/08/2022 PCP: [name], MD  Brief Narrative:  86 year old black female known HFrEF/HFpEF ESRD MWF since 07/2018 Prior left renal and adnexal lesions COPD Gold stage II (quit smoking 1990 Reflux Gout Underwent parathyroidectomy 01/25/2022 with autotransplant to right forearm-nephrology managed hypocalcemia  Presented home with liquid stool fecal incontinence (has been taking oxycodone since her recent surgery, also had been on phosphate binders CT abdomen pelvis = large volume stool distal colon rectal vault perirectal inflammatory stranding and manual disimpaction was attempted but unsuccessful 8/15-DG x-ray shows possible ileus  Hospital-Problem based course  Ileus/fecal incontinence/diarrhea secondary to overflow around area of fecal impaction CT abdomen pelvis = fecal impaction with stercoral colitis Repeat KUB 8/15 equal decrease stool burden + ileus--- Milk and molasses enema 8/15  Passed large ball of stool sometime on 8/14 Can continue MiraLAX daily and Dulcolax daily for 10 Diet graduated to regular renal diet 8/17 Recent parathyroidectomy Severe hypocalcemia on admission and hypophosphatemia Very hypocalcemic on admission which is now resolved--unclear if hypocalcemia predisposed to ileus Calcium being managed by renal-calcium carbonate 3 times daily in between meals Phos replaced IV x2 this admission Holding Renvela at discharge ESRD MWF Ultrafiltration and replacement of electrolytes as per renal-appreciate input Mild hypokalemia Replace orally with K-Dur 20 cautiously Anemia of renal disease Hemoglobin stable in the 8-10 range-no further work-up at this time ESA as well as replacement as per nephrology  DVT prophylaxis: Heparin Code Status: Full Family Communication: None Disposition:  Status is: Inpatient Remains inpatient appropriate because:  Remains nauseous and needs replacement  of electrolytes  Expect can discharge home a.m. 8/18.     Consultants:  Renal  Procedures: None  Antimicrobials: No   Subjective:  Having stool which is soft but is improved from prior No chest pain no fever A little nauseous this morning after eating full meal of grits No vomit however Overall is improved   Objective: Vitals:   02/14/22 2015 02/15/22 0514 02/15/22 0821 02/15/22 0837  BP: (!) 107/45 (!) 104/52  121/62  Pulse: 79 77 78 74  Resp: '18 16 16 18  '$ Temp: 98 F (36.7 C) 98.6 F (37 C)  97.8 F (36.6 C)  TempSrc: Oral Oral  Oral  SpO2: 98% 95% 97% 95%  Weight:      Height:        Intake/Output Summary (Last 24 hours) at 02/15/2022 1145 Last data filed at 02/15/2022 0850 Gross per 24 hour  Intake 340.16 ml  Output 900 ml  Net -559.84 ml    Filed Weights   02/12/22 1204 02/14/22 0736 02/14/22 1213  Weight: 62.9 kg 62.6 kg 61.9 kg    Examination:  Coherent pleasant no distress No JVD Fistula left arm Abdomen soft no rebound ROM intact Power 5/5 S1-S2 no murmur Neurologically intact  Data Reviewed: personally reviewed   CBC    Component Value Date/Time   WBC 7.7 02/15/2022 0110   RBC 2.76 (L) 02/15/2022 0110   HGB 9.0 (L) 02/15/2022 0110   HGB 11.6 07/06/2021 1225   HCT 28.2 (L) 02/15/2022 0110   HCT 33.0 (L) 07/06/2021 1225   PLT 210 02/15/2022 0110   PLT 196 07/06/2021 1225   MCV 102.2 (H) 02/15/2022 0110   MCV 96 07/06/2021 1225   MCH 32.6 02/15/2022 0110   MCHC 31.9 02/15/2022 0110   RDW 16.8 (H) 02/15/2022 0110   RDW 13.1 07/06/2021 1225   LYMPHSABS 1.6 02/15/2022 0110  LYMPHSABS 1.5 07/06/2021 1225   MONOABS 0.9 02/15/2022 0110   EOSABS 0.2 02/15/2022 0110   EOSABS 0.1 07/06/2021 1225   BASOSABS 0.0 02/15/2022 0110   BASOSABS 0.0 07/06/2021 1225      Latest Ref Rng & Units 02/15/2022    1:10 AM 02/14/2022    8:00 AM 02/13/2022   12:54 PM  CMP  Glucose 70 - 99 mg/dL 83  95  96   BUN 8 - 23 mg/dL '9  21  18    '$ Creatinine 0.44 - 1.00 mg/dL 3.97  7.10  5.91   Sodium 135 - 145 mmol/L 135  134  137   Potassium 3.5 - 5.1 mmol/L 3.2  3.8  3.3   Chloride 98 - 111 mmol/L 95  93  90   CO2 22 - 32 mmol/L 27  28  33   Calcium 8.9 - 10.3 mg/dL 7.7  9.8  10.0   Total Protein 6.5 - 8.1 g/dL   7.1   Total Bilirubin 0.3 - 1.2 mg/dL   0.4   Alkaline Phos 38 - 126 U/L   403   AST 15 - 41 U/L   30   ALT 0 - 44 U/L   15      Radiology Studies: DG Abd Portable 1V  Result Date: 02/14/2022 CLINICAL DATA:  Abdominal pain EXAM: PORTABLE ABDOMEN - 1 VIEW COMPARISON:  KUB 1 day prior FINDINGS: Previously seen gaseous distention of the bowel appears improved. There remains moderate stool in the right and left colon. There is no definite free intraperitoneal air, within the confines of supine technique. A probable small left renal stone is noted. There is no other abnormal soft tissue calcification. The lung bases are clear.  There is no acute osseous abnormality. IMPRESSION: Decreased gaseous distention of the bowel since the study from 1 day prior with persistent moderate stool in the right and left colon. Electronically Signed   By: Valetta Mole M.D.   On: 02/14/2022 08:09     Scheduled Meds:  aspirin EC  81 mg Oral QPC supper   calcitRIOL  2 mcg Oral Q M,W,F   calcium carbonate  1 tablet Oral TID   Chlorhexidine Gluconate Cloth  6 each Topical Q0600   Chlorhexidine Gluconate Cloth  6 each Topical Q0600   heparin  5,000 Units Subcutaneous Q8H   lactulose  20 g Oral Daily   milk and molasses  1 enema Rectal Once   mometasone-formoterol  2 puff Inhalation BID   polyethylene glycol  17 g Oral Daily   polyvinyl alcohol  1 drop Both Eyes QHS   sodium chloride flush  3 mL Intravenous Q12H   Continuous Infusions:   LOS: 2 days   Time spent: [place], MD [facility] To contact the attending provider between 7A-7P or the covering provider during after hours 7P-7A, please log into the web  site [email or web address] and access using universal [place] password for that web site. If you do not have the password, please call the hospital operator.  02/15/2022, 11:45 AM

## 2022-02-15 NOTE — Progress Notes (Signed)
Mobility Specialist Progress Note:   02/15/22 0900  Mobility  Activity Ambulated with assistance in room  Level of Assistance Standby assist, set-up cues, supervision of patient - no hands on  Assistive Device Front wheel walker  Distance Ambulated (ft) 20 ft  Activity Response Tolerated well  $Mobility charge 1 Mobility   Pt received ambulating in room with NT. No physical assistance required. Pt displayed steady gait with RW around room, and back to bed. Agreeable to hallway ambulation later today. Pt left with all needs met.   Nelta Numbers Acute Rehab Secure Chat or Office Phone: [phone]

## 2022-02-15 NOTE — Progress Notes (Signed)
Mobility Specialist Progress Note:   02/15/22 1600  Mobility  Activity Ambulated with assistance in hallway  Level of Assistance Standby assist, set-up cues, supervision of patient - no hands on  Assistive Device Front wheel walker  Distance Ambulated (ft) 200 ft  Activity Response Tolerated well  $Mobility charge 1 Mobility   Pt agreeable to mobility session. Required no physical assistance throughout. Pt back in bed with all needs met.   Nelta Numbers Acute Rehab Secure Chat or Office Phone: [phone]

## 2022-02-16 ENCOUNTER — Other Ambulatory Visit (HOSPITAL_COMMUNITY): Payer: Self-pay

## 2022-02-16 DIAGNOSIS — K5641 Fecal impaction: Secondary | ICD-10-CM | POA: Diagnosis not present

## 2022-02-16 LAB — RENAL FUNCTION PANEL
Albumin: 2.9 g/dL — ABNORMAL LOW (ref 3.5–5.0)
Anion gap: 14 (ref 5–15)
BUN: 15 mg/dL (ref 8–23)
CO2: 29 mmol/L (ref 22–32)
Calcium: 7.8 mg/dL — ABNORMAL LOW (ref 8.9–10.3)
Chloride: 91 mmol/L — ABNORMAL LOW (ref 98–111)
Creatinine, Ser: 6.09 mg/dL — ABNORMAL HIGH (ref 0.44–1.00)
GFR, Estimated: 6 mL/min — ABNORMAL LOW (ref >60)
Glucose, Bld: 88 mg/dL (ref 70–99)
Phosphorus: 2.2 mg/dL — ABNORMAL LOW (ref 2.5–4.6)
Potassium: 3.3 mmol/L — ABNORMAL LOW (ref 3.5–5.1)
Sodium: 134 mmol/L — ABNORMAL LOW (ref 135–145)

## 2022-02-16 LAB — CBC
HCT: 28.3 % — ABNORMAL LOW (ref 36.0–46.0)
Hemoglobin: 8.9 g/dL — ABNORMAL LOW (ref 12.0–15.0)
MCH: 32.7 pg (ref 26.0–34.0)
MCHC: 31.4 g/dL (ref 30.0–36.0)
MCV: 104 fL — ABNORMAL HIGH (ref 80.0–100.0)
Platelets: 221 10*3/uL (ref 150–400)
RBC: 2.72 MIL/uL — ABNORMAL LOW (ref 3.87–5.11)
RDW: 16.7 % — ABNORMAL HIGH (ref 11.5–15.5)
WBC: 7.3 10*3/uL (ref 4.0–10.5)
nRBC: 0 % (ref 0.0–0.2)

## 2022-02-16 MED ORDER — POTASSIUM CHLORIDE CRYS ER 20 MEQ PO TBCR
40.0000 meq | EXTENDED_RELEASE_TABLET | Freq: Every day | ORAL | 0 refills | Status: DC
  Filled 2022-02-16: qty 7, 3d supply, fill #0

## 2022-02-16 MED ORDER — POLYETHYLENE GLYCOL 3350 17 G PO PACK
17.0000 g | PACK | ORAL | 0 refills | Status: DC
  Filled 2022-02-16: qty 14, 28d supply, fill #0

## 2022-02-16 MED ORDER — OYSTER SHELL CALCIUM 500 MG PO TABS
1.0000 | ORAL_TABLET | Freq: Three times a day (TID) | ORAL | 1 refills | Status: DC
  Filled 2022-02-16 – 2022-05-17 (×2): qty 90, 30d supply, fill #0

## 2022-02-16 NOTE — Progress Notes (Addendum)
[facility] Progress Note   Subjective:   Patient seen and examined at bedside.  Reports she is going home today.  Feeling better.  Tolerating diet.  Denies CP, SOB, abdominal pain and n/v/d.  Discussed stopping her binders when she goes home and will restart if phosphorus starts to increase.   Objective Vitals:   02/16/22 0717 02/16/22 0730 02/16/22 0800 02/16/22 0830  BP: (!) 114/52 (!) 131/57 (!) 121/52 118/60  Pulse: 64 64 66 72  Resp: '10 14 13 13  '$ Temp:      TempSrc:      SpO2: 96% 97% 95% 96%  Weight:      Height:       Physical Exam General:well appearing female in NAD Heart:RRR, no mrg Lungs:CTAB, nml WOB on RA Abdomen:soft, NTND Extremities:no LE edema Dialysis Access: LU AVF in use   Filed Weights   02/14/22 0736 02/14/22 1213 02/16/22 0712  Weight: 62.6 kg 61.9 kg 62.3 kg    Intake/Output Summary (Last 24 hours) at 02/16/2022 0847 Last data filed at 02/15/2022 0850 Gross per 24 hour  Intake 80 ml  Output --  Net 80 ml    Additional Objective Labs: Basic Metabolic Panel: Recent Labs  Lab 02/14/22 0800 02/15/22 0110 02/16/22 0124  NA 134* 135 134*  K 3.8 3.2* 3.3*  CL 93* 95* 91*  CO2 '28 27 29  '$ GLUCOSE 95 83 88  BUN '21 9 15  '$ CREATININE 7.10* 3.97* 6.09*  CALCIUM 9.8 7.7* 7.8*  PHOS 1.7* 3.5 2.2*   Liver Function Tests: Recent Labs  Lab 02/10/22 1042 02/11/22 0547 02/13/22 1254 02/14/22 0800 02/15/22 0110 02/16/22 0124  AST 43*  --  30  --   --   --   ALT 15  --  15  --   --   --   ALKPHOS 429*  --  403*  --   --   --   BILITOT 0.4  --  0.4  --   --   --   PROT 5.9*  --  7.1  --   --   --   ALBUMIN 2.7*  2.7*   < > 3.1* 2.8* 2.8* 2.9*   < > = values in this interval not displayed.    CBC: Recent Labs  Lab 02/10/22 0713 02/12/22 0830 02/13/22 1254 02/14/22 0800 02/15/22 0110  WBC 13.8* 8.2 10.6* 10.3 7.7  NEUTROABS  --   --   --   --  4.7  HGB 10.5* 9.1* 9.4* 9.0* 9.0*  HCT 31.3* 28.2* 28.7* 28.2* 28.2*  MCV  100.6* 101.8* 101.8* 103.7* 102.2*  PLT 193 247 226 227 210   Medications:   aspirin EC  81 mg Oral QPC supper   calcitRIOL  2 mcg Oral Q M,W,F   calcium carbonate  1 tablet Oral TID   Chlorhexidine Gluconate Cloth  6 each Topical Q0600   Chlorhexidine Gluconate Cloth  6 each Topical Q0600   heparin  5,000 Units Subcutaneous Q8H   lactulose  20 g Oral Daily   milk and molasses  1 enema Rectal Once   mometasone-formoterol  2 puff Inhalation BID   polyethylene glycol  17 g Oral Daily   polyvinyl alcohol  1 drop Both Eyes QHS   potassium chloride  20 mEq Oral Daily   sodium chloride flush  3 mL Intravenous Q12H    Dialysis Orders: MWF [facility] 3h 57mn  4090/1.5  64.8kg  2/2 bath  P2  Heparin  [place] AVF - last hep B labs here: 01/25/22 - last HD 8/9 post wt 65.7kg - last Hb 9.9 on 8/9 - mircera 30 q 2, last 8/9 - iron sucrose 50 weekly, last 8/9 - calcitriol 2.0 mcg po tiw   Assessment/Plan: Acute diarrhea secondary to overflow, fecal impaction - post laxatives for constipation. CT here showing constipation/impaction, got enema and lactulose. Improved, management per primary MD. Hypocalcemia - Presented with Ca++ 6-7 range here early in admission which is low, fortunately is not symptomatic. SP recent parathyroidectomy and was dc'd w/ Ca++ 7.5- 8.5 range after 4-5 days of adjustments for "hungry bones syndrome" At home pt states she was taking 4 CaCO3 tabs only DAILY when it should have been QID. Have resumed CaCO3 at slightly lower dose of 4 po TID. It is also supposed to be given between meals so as not to bind phos. We also gave 4 gm IV Ca gluconate on 8/12 and continued her po calcitriol at 2.0 ug po tiw w/ HD (and dc'd her daily home dose of 0.5 ug).  Corrected calcium was severely high ~11.6. Stopped calcium supplements 8/16. CCa++ now around 8.7. Calcium carbonate restarted at 1 tab TID (in between meals) Hypophosphatemia - phos was < 2. Required repletion. Do not resume renvela at  discharge. Discussed with patient. Replete prn. PO4 now 2.2. Should improve with her normal diet at home.  Discussed not restarting binders or taking Ca supplements with meals. ESRD - on HD MWF. HD today. Volume- does not appear volume overloaded. Uf as tolerated HTN - BP's low normal. Not on any antihypertensive meds.  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 As above.  Anemia esrd - Hgb 9.0. ESA was just given on 8/9 at OP unit, due to start esa next week  Jen Mow, PA-C [place] 02/16/2022,8:47 AM  LOS: 3 days   I have personally seen and examined this patient and agree with the assessment/plan as outlined below by [employer]. Patient seen and examined on dialysis. Tolerating treatment with no issues. UFG 2L. Benn Tarver,MD 02/16/2022 9:33 AM

## 2022-02-16 NOTE — Progress Notes (Signed)
D/C order noted. Contacted [facility] to advise clinic of pt's d/c today and that pt will resume care on Monday.   Melven Sartorius Renal Navigator [phone]

## 2022-02-16 NOTE — Discharge Summary (Signed)
Physician Discharge Summary  Mallory Chavez OJJ:[id number] DOB: [birth year] DOA: 02/08/2022  PCP: [name], MD  Admit date: 02/08/2022 Discharge date: 02/16/2022  Time spent: 27 minutes  Recommendations for Outpatient Follow-up:  Recommend calcium, phosphorus in the outpatient setting in addition to renal panel in about 1 week Do recommend holding off on any pain medications and phosphate binders for the time being based on the constipation she has had She should follow-up with nephrology in about a week to 2 weeks  Discharge Diagnoses:  MAIN problem for hospitalization   Ileus with constipation ESRD severe hypocalcemia secondary to recent parathyroidectomy  Please see below for itemized issues addressed in [place]- refer to other progress notes for clarity if needed  Discharge Condition: Improved  Diet recommendation: Renal  Filed Weights   02/14/22 0736 02/14/22 1213 02/16/22 0712  Weight: 62.6 kg 61.9 kg 62.3 kg    History of present illness:  86 year old black female known HFrEF/HFpEF ESRD MWF since 07/2018 Prior left renal and adnexal lesions COPD Gold stage II (quit smoking 1990 Reflux Gout Underwent parathyroidectomy 01/25/2022 with autotransplant to right forearm-nephrology managed hypocalcemia   Presented home with liquid stool fecal incontinence (has been taking oxycodone since her recent surgery, also had been on phosphate binders CT abdomen pelvis = large volume stool distal colon rectal vault perirectal inflammatory stranding and manual disimpaction was attempted but unsuccessful 8/15-DG x-ray shows possible ileus    Hospital Course:  Ileus/fecal incontinence/diarrhea secondary to overflow around area of fecal impaction CT abdomen pelvis = fecal impaction with stercoral colitis Repeat KUB 8/15 equal decrease stool burden + ileus--- Milk and molasses enema 8/15  Passed large ball of stool sometime on 8/14 MiraLAX was changed to every other day given  she was having still some loose stools Patient tolerated renal diet from 8/17 Recent parathyroidectomy Severe hypocalcemia on admission and hypophosphatemia Very hypocalcemic on admission which is now resolved--unclear if hypocalcemia predisposed to ileus Calcium being managed by renal-calcium carbonate 3 times daily in between meals and should continue Phos replaced IV x2 this admission and should be monitored in the outpatient setting Holding Renvela at discharge ESRD MWF Ultrafiltration and replacement of electrolytes as per renal-appreciate input Mild hypokalemia Replace orally with K-Dur 20 cautiously--given prescription of tablets on discharge Anemia of renal disease Hemoglobin stable in the 8-10 range-no further work-up at this time ESA as well as replacement as per nephrology Consultations: Nephrology  Discharge Exam: Vitals:   02/16/22 0730 02/16/22 0800  BP: (!) 131/57 (!) 121/52  Pulse: 64 66  Resp: 14 13  Temp:    SpO2: 97% 95%    Subj on day of d/c   Awake coherent seen on dialysis unit Does not seem to have any distress Looks comfortable Tells me having loose stools but verified with nursing that they are more pasty No chest pain Able to tolerate diet  General Exam on discharge  EOMI NCAT no focal deficit no icterus no pallor no rales rhonchi Access in left arm Abdomen soft no rebound no guarding No lower extremity edema  Discharge Instructions   Discharge Instructions     Diet - low sodium heart healthy   Complete by: As directed    Discharge instructions   Complete by: As directed    Your diagnosis hospital stay with significant constipation likely consistent with a low calcium which can sometimes cause constipation It was felt that you will need calcium supplementation as well as every other day laxative to help you go  to the bathroom Do not take a laxative if you have more than 3 loose stools per day I would recommend that you follow-up as an  outpatient with nephrology and/or your primary care physician and get labs including a calcium and phosphorus in about 1 week It would also be a good idea for you to stay off of any pain meds as this can cause significant constipation  Best of luck   Increase activity slowly   Complete by: As directed    No wound care   Complete by: As directed       Allergies as of 02/16/2022   No Known Allergies      Medication List     STOP taking these medications    albuterol 108 (90 Base) MCG/ACT inhaler Commonly known as: VENTOLIN HFA   calcitRIOL 0.5 MCG capsule Commonly known as: ROCALTROL   cyclobenzaprine 5 MG tablet Commonly known as: FLEXERIL   famotidine 40 MG tablet Commonly known as: PEPCID   gabapentin 100 MG capsule Commonly known as: NEURONTIN   oxyCODONE 5 MG immediate release tablet Commonly known as: Oxy IR/ROXICODONE   sevelamer carbonate 800 MG tablet Commonly known as: RENVELA       TAKE these medications    acetaminophen 500 MG tablet Commonly known as: TYLENOL Take 500 mg by mouth daily as needed for mild pain or moderate pain.   aspirin EC 81 MG tablet Take 81 mg by mouth daily after supper.   calcium carbonate 1250 (500 Ca) MG tablet Commonly known as: OS-CAL - dosed in mg of elemental calcium Take 1 tablet (1,250 mg total) by mouth 3 (three) times daily. What changed:  how much to take how to take this when to take this   colchicine 0.6 MG tablet Take 1 tablet by mouth once daily as needed What changed:  how much to take how to take this when to take this reasons to take this   DIALYVITE 800 WITH ZINC 0.8 MG Tabs Take 1 tablet by mouth every evening.   DRY EYE RELIEF DROPS OP Place 1 drop into both eyes at bedtime.   lidocaine-prilocaine cream Commonly known as: EMLA Apply  a small amount to skin as directed to AVF- prior to dialysis What changed: Another medication with the same name was removed. Continue taking this  medication, and follow the directions you see here.   loratadine 10 MG tablet Commonly known as: CLARITIN Take 10 mg by mouth daily after supper.   OXYGEN Inhale 1 L into the lungs See admin instructions. with sleep and exertion as needed AHC   polyethylene glycol 17 g packet Commonly known as: MIRALAX / GLYCOLAX Take 17 g by mouth every other day.   potassium chloride SA 20 MEQ tablet Commonly known as: KLOR-CON M Take 2 tablets (40 mEq total) by mouth daily.   Symbicort 160-4.5 MCG/ACT inhaler Generic drug: budesonide-formoterol Inhale 2 puffs into the lungs 2 (two) times daily.       No Known Allergies  Follow-up Information     [facility] Follow up.   Specialty: Home Health Services Contact information: [place] [address] [phone]                  The results of significant diagnostics from this hospitalization (including imaging, microbiology, ancillary and laboratory) are listed below for reference.    Significant Diagnostic Studies: DG Abd Portable 1V  Result Date: 02/14/2022 CLINICAL DATA:  Abdominal pain EXAM: PORTABLE ABDOMEN - 1 VIEW COMPARISON:  KUB 1 day prior FINDINGS: Previously seen gaseous distention of the bowel appears improved. There remains moderate stool in the right and left colon. There is no definite free intraperitoneal air, within the confines of supine technique. A probable small left renal stone is noted. There is no other abnormal soft tissue calcification. The lung bases are clear.  There is no acute osseous abnormality. IMPRESSION: Decreased gaseous distention of the bowel since the study from 1 day prior with persistent moderate stool in the right and left colon. Electronically Signed   By: Valetta Mole M.D.   On: 02/14/2022 08:09   DG Abd 1 View  Result Date: 02/13/2022 CLINICAL DATA:  Abdominal pain. EXAM: ABDOMEN - 1 VIEW COMPARISON:  02/12/2022 FINDINGS: Similar ileus pattern  involving small bowel and colon without overt small bowel obstruction. No signs of free intraperitoneal air. Probable decrease in stool burden in the distal colon. IMPRESSION: Similar ileus pattern with probable decrease in stool burden in the distal colon. Electronically Signed   By: Aletta Edouard M.D.   On: 02/13/2022 08:28   DG Abd 1 View  Result Date: 02/12/2022 CLINICAL DATA:  Intractable nausea and vomiting. EXAM: ABDOMEN - 1 VIEW COMPARISON:  None Available. FINDINGS: Stool is seen in the majority of the colon. No small bowel dilatation. IMPRESSION: Stool in the majority of the colon is indicative of constipation. Electronically Signed   By: Lorin Picket M.D.   On: 02/12/2022 14:53   CT ABDOMEN PELVIS WO CONTRAST  Result Date: 02/08/2022 CLINICAL DATA:  Abdominal pain, acute, nonlocalized. EXAM: CT ABDOMEN AND PELVIS WITHOUT CONTRAST TECHNIQUE: Multidetector CT imaging of the abdomen and pelvis was performed following the standard protocol without IV contrast. RADIATION DOSE REDUCTION: This exam was performed according to the departmental dose-optimization program which includes automated exposure control, adjustment of the mA and/or kV according to patient size and/or use of iterative reconstruction technique. COMPARISON:  None Available. FINDINGS: Lower chest: Mild cardiomegaly with enlargement of the right ventricle and right atrium. At least moderate multi-vessel coronary artery calcification. No pericardial effusion. Visualized lung bases are clear. Hepatobiliary: No focal liver abnormality is seen. No gallstones, gallbladder wall thickening, or biliary dilatation. Pancreas: Unremarkable Spleen: Unremarkable Adrenals/Urinary Tract: The adrenal glands are unremarkable. The kidneys are markedly atrophic bilaterally. Multiple exophytic simple cortical cysts are seen bilaterally in keeping with cystic change of chronic renal failure. There are, however, 2 relatively hyperdense cortical lesions  arising from the upper and lower pole of the left kidney measuring up to 62 Hounsfield units in density measuring up to 21 mm which are indeterminate. These may represent hyperdense renal cysts or solid renal masses. A similar 14 mm lesion is seen within the interpolar region of the right kidney. No hydronephrosis. 2-3 mm nonobstructing calculi are seen within the kidneys bilaterally. No ureteral calculi. The bladder is unremarkable. Stomach/Bowel: There is large volume stool seen within the distal colon and rectal vault with perirectal inflammatory stranding and rectal wall thickening suggesting changes of fecal impaction and/or stercoral proctitis. Superimposed moderate pancolonic diverticulosis without superimposed acute inflammatory change. The stomach, small bowel, and large bowel are otherwise unremarkable. Appendix normal. No free intraperitoneal gas or fluid. Vascular/Lymphatic: Extensive aortoiliac atherosclerotic calcification. No aortic aneurysm. No pathologic adenopathy within the abdomen and pelvis. Reproductive: Status post hysterectomy. Stable 2.8 cm right adnexal cystic lesion. While not optimally characterized on this examination, its stability over time is compatible with a benign etiology  and further follow-up is not required. Other: No abdominal wall hernia. Musculoskeletal: No acute bone abnormality. Diffuse sclerosis of the osseous structures is in keeping with changes of renal osteodystrophy. 3.4 cm intramuscular lipoma within the left gluteus maximus musculature. IMPRESSION: 1. Large volume stool within the distal colon and rectal vault with perirectal inflammatory stranding and rectal wall thickening suggesting changes of fecal impaction and/or stercoral proctitis. 2. Moderate pancolonic diverticulosis without superimposed acute inflammatory change. 3. Markedly atrophic kidneys bilaterally with multiple exophytic simple cortical cysts in keeping with cystic change of chronic renal failure.  Superimposed bilateral hyperdense cortical lesions which may represent hyperdense renal cysts or solid renal masses. This would be better assessed with dedicated contrast enhanced CT or MRI examination, if indicated. 4. Mild cardiomegaly with enlargement of the right ventricle and right atrium. At least moderate multi-vessel coronary artery calcification. 5. Diffuse sclerosis of the osseous structures in keeping with changes of renal osteodystrophy. Aortic Atherosclerosis (ICD10-I70.0). Electronically Signed   By: Fidela Salisbury M.D.   On: 02/08/2022 21:07    Microbiology: Recent Results (from the past 240 hour(s))  C Difficile Quick Screen w PCR reflex     Status: None   Collection Time: 02/08/22  7:25 PM   Specimen: STOOL  Result Value Ref Range Status   C Diff antigen NEGATIVE NEGATIVE Final   C Diff toxin NEGATIVE NEGATIVE Final   C Diff interpretation No C. difficile detected.  Final    Comment: Performed at [facility], [place] [address] [place], [place] [address]  Gastrointestinal Panel by PCR , Stool     Status: None   Collection Time: 02/08/22  7:25 PM   Specimen: STOOL  Result Value Ref Range Status   Campylobacter species NOT DETECTED NOT DETECTED Final   Plesimonas shigelloides NOT DETECTED NOT DETECTED Final   Salmonella species NOT DETECTED NOT DETECTED Final   Yersinia enterocolitica NOT DETECTED NOT DETECTED Final   Vibrio species NOT DETECTED NOT DETECTED Final   Vibrio cholerae NOT DETECTED NOT DETECTED Final   Enteroaggregative E coli (EAEC) NOT DETECTED NOT DETECTED Final   Enteropathogenic E coli (EPEC) NOT DETECTED NOT DETECTED Final   Enterotoxigenic E coli (ETEC) NOT DETECTED NOT DETECTED Final   Shiga like toxin producing E coli (STEC) NOT DETECTED NOT DETECTED Final   Shigella/Enteroinvasive E coli (EIEC) NOT DETECTED NOT DETECTED Final   Cryptosporidium NOT DETECTED NOT DETECTED Final   Cyclospora cayetanensis NOT DETECTED NOT DETECTED Final   Entamoeba  histolytica NOT DETECTED NOT DETECTED Final   Giardia lamblia NOT DETECTED NOT DETECTED Final   Adenovirus F40/41 NOT DETECTED NOT DETECTED Final   Astrovirus NOT DETECTED NOT DETECTED Final   Norovirus GI/GII NOT DETECTED NOT DETECTED Final   Rotavirus A NOT DETECTED NOT DETECTED Final   Sapovirus (I, II, IV, and V) NOT DETECTED NOT DETECTED Final    Comment: Performed at [facility], [address] [place], [place] [address]     Labs: Basic Metabolic Panel: Recent Labs  Lab 02/10/22 1042 02/11/22 0547 02/12/22 0830 02/13/22 1254 02/14/22 0800 02/15/22 0110 02/16/22 0124  NA 132*  134*   < > 133* 137 134* 135 134*  K 2.9*  2.8*   < > 3.8 3.3* 3.8 3.2* 3.3*  CL 90*  90*   < > 93* 90* 93* 95* 91*  CO2 26  28   < > 28 33* '28 27 29  '$ GLUCOSE 140*  141*   < > 95  96 95 83 88  BUN 16  15   < > 29* '18 21 9 15  '$ CREATININE 5.30*  5.18*   < > 8.43* 5.91* 7.10* 3.97* 6.09*  CALCIUM 6.5*  6.7*   < > 9.0 10.0 9.8 7.7* 7.8*  MG 2.8*  --   --  2.9*  --   --   --   PHOS 3.2   < > 2.1* 1.4* 1.7* 3.5 2.2*   < > = values in this interval not displayed.   Liver Function Tests: Recent Labs  Lab 02/10/22 1042 02/11/22 0547 02/12/22 0830 02/13/22 1254 02/14/22 0800 02/15/22 0110 02/16/22 0124  AST 43*  --   --  30  --   --   --   ALT 15  --   --  15  --   --   --   ALKPHOS 429*  --   --  403*  --   --   --   BILITOT 0.4  --   --  0.4  --   --   --   PROT 5.9*  --   --  7.1  --   --   --   ALBUMIN 2.7*  2.7*   < > 2.6* 3.1* 2.8* 2.8* 2.9*   < > = values in this interval not displayed.   No results for input(s): "LIPASE", "AMYLASE" in the last 168 hours. No results for input(s): "AMMONIA" in the last 168 hours. CBC: Recent Labs  Lab 02/10/22 0713 02/12/22 0830 02/13/22 1254 02/14/22 0800 02/15/22 0110  WBC 13.8* 8.2 10.6* 10.3 7.7  NEUTROABS  --   --   --   --  4.7  HGB 10.5* 9.1* 9.4* 9.0* 9.0*  HCT 31.3* 28.2* 28.7* 28.2* 28.2*  MCV 100.6* 101.8* 101.8*  103.7* 102.2*  PLT 193 247 226 227 210   Cardiac Enzymes: No results for input(s): "CKTOTAL", "CKMB", "CKMBINDEX", "TROPONINI" in the last 168 hours. BNP: BNP (last 3 results) No results for input(s): "BNP" in the last 8760 hours.  ProBNP (last 3 results) No results for input(s): "PROBNP" in the last 8760 hours.  CBG: No results for input(s): "GLUCAP" in the last 168 hours.     Signed:  Nita Sells MD   [facility] 02/16/2022, 8:33 AM

## 2022-02-17 ENCOUNTER — Telehealth: Payer: Self-pay | Admitting: Nephrology

## 2022-02-17 NOTE — Telephone Encounter (Signed)
Transition of Care Contact from [place]   Date of Discharge: 02/16/22 Date of Contact: 02/17/22 Method of contact: phone Talked to patient   Patient contacted to discuss transition of care form recent hospitaliztion. Patient was admitted to [facility] from 02/08/22 to 02/16/22 with the discharge diagnosis of ileus, fecal impaction, hypocalcemia.     Medication changes were reviewed - advised to hold KCl.  Will recheck K on Monday.    Patient will follow up with is outpatient dialysis center 02/19/22.   Other follow up needs include none identified.    Jen Mow, PA-C [facility] Pager: [phone]

## 2022-03-02 ENCOUNTER — Other Ambulatory Visit (HOSPITAL_COMMUNITY): Payer: Self-pay

## 2022-03-02 ENCOUNTER — Other Ambulatory Visit: Payer: Self-pay | Admitting: Internal Medicine

## 2022-03-06 ENCOUNTER — Other Ambulatory Visit (HOSPITAL_COMMUNITY): Payer: Self-pay

## 2022-03-06 MED ORDER — ALBUTEROL SULFATE HFA 108 (90 BASE) MCG/ACT IN AERS
INHALATION_SPRAY | RESPIRATORY_TRACT | 3 refills | Status: DC
  Filled 2022-03-06: qty 6.7, 17d supply, fill #0
  Filled 2022-09-25: qty 6.7, 17d supply, fill #1
  Filled 2022-11-16: qty 6.7, 17d supply, fill #2
  Filled 2023-01-17: qty 6.7, 17d supply, fill #3

## 2022-03-15 ENCOUNTER — Other Ambulatory Visit (HOSPITAL_COMMUNITY): Payer: Self-pay

## 2022-03-15 MED ORDER — COLCHICINE 0.6 MG PO TABS
0.6000 mg | ORAL_TABLET | Freq: Two times a day (BID) | ORAL | 3 refills | Status: DC
  Filled 2022-03-15: qty 180, 90d supply, fill #0

## 2022-03-16 ENCOUNTER — Other Ambulatory Visit (HOSPITAL_COMMUNITY): Payer: Self-pay

## 2022-03-19 ENCOUNTER — Other Ambulatory Visit (HOSPITAL_COMMUNITY): Payer: Self-pay

## 2022-03-19 MED ORDER — MIRTAZAPINE 7.5 MG PO TABS
7.5000 mg | ORAL_TABLET | Freq: Every day | ORAL | 11 refills | Status: DC
  Filled 2022-03-19: qty 30, 30d supply, fill #0
  Filled 2022-05-17: qty 30, 30d supply, fill #1
  Filled 2022-07-26 (×2): qty 30, 30d supply, fill #2

## 2022-03-20 ENCOUNTER — Other Ambulatory Visit (HOSPITAL_COMMUNITY): Payer: Self-pay

## 2022-04-04 ENCOUNTER — Other Ambulatory Visit (HOSPITAL_COMMUNITY): Payer: Self-pay

## 2022-05-02 ENCOUNTER — Other Ambulatory Visit (HOSPITAL_COMMUNITY): Payer: Self-pay

## 2022-05-08 ENCOUNTER — Other Ambulatory Visit (HOSPITAL_COMMUNITY): Payer: Self-pay

## 2022-05-08 MED ORDER — OMEPRAZOLE 20 MG PO CPDR
20.0000 mg | DELAYED_RELEASE_CAPSULE | Freq: Two times a day (BID) | ORAL | 0 refills | Status: DC
  Filled 2022-05-08: qty 20, 10d supply, fill #0

## 2022-05-17 ENCOUNTER — Other Ambulatory Visit (HOSPITAL_COMMUNITY): Payer: Self-pay

## 2022-05-18 ENCOUNTER — Other Ambulatory Visit (HOSPITAL_COMMUNITY): Payer: Self-pay

## 2022-05-30 ENCOUNTER — Other Ambulatory Visit (HOSPITAL_COMMUNITY): Payer: Self-pay

## 2022-06-28 ENCOUNTER — Other Ambulatory Visit (HOSPITAL_COMMUNITY): Payer: Self-pay

## 2022-07-18 ENCOUNTER — Other Ambulatory Visit (HOSPITAL_COMMUNITY): Payer: Self-pay

## 2022-07-18 MED ORDER — PREDNISOLONE ACETATE 1 % OP SUSP
1.0000 [drp] | Freq: Four times a day (QID) | OPHTHALMIC | 1 refills | Status: DC
  Filled 2022-07-18 – 2022-08-02 (×4): qty 5, 25d supply, fill #0

## 2022-07-18 MED ORDER — GATIFLOXACIN 0.5 % OP SOLN
1.0000 [drp] | Freq: Four times a day (QID) | OPHTHALMIC | 1 refills | Status: DC
  Filled 2022-07-18 – 2022-08-02 (×4): qty 5, 25d supply, fill #0

## 2022-07-18 MED ORDER — MIDODRINE HCL 10 MG PO TABS
10.0000 mg | ORAL_TABLET | Freq: Every day | ORAL | 6 refills | Status: DC
  Filled 2022-07-18 – 2022-07-26 (×2): qty 30, 30d supply, fill #0

## 2022-07-18 MED ORDER — KETOROLAC TROMETHAMINE 0.5 % OP SOLN
1.0000 [drp] | Freq: Four times a day (QID) | OPHTHALMIC | 1 refills | Status: DC
  Filled 2022-07-18 – 2022-08-02 (×4): qty 5, 25d supply, fill #0

## 2022-07-20 ENCOUNTER — Other Ambulatory Visit: Payer: Self-pay

## 2022-07-21 ENCOUNTER — Other Ambulatory Visit (HOSPITAL_COMMUNITY): Payer: Self-pay

## 2022-07-26 ENCOUNTER — Other Ambulatory Visit (HOSPITAL_COMMUNITY): Payer: Self-pay

## 2022-07-27 ENCOUNTER — Other Ambulatory Visit (HOSPITAL_COMMUNITY): Payer: Self-pay

## 2022-07-28 ENCOUNTER — Other Ambulatory Visit (HOSPITAL_COMMUNITY): Payer: Self-pay

## 2022-07-30 ENCOUNTER — Other Ambulatory Visit (HOSPITAL_COMMUNITY): Payer: Self-pay

## 2022-07-31 ENCOUNTER — Other Ambulatory Visit: Payer: Self-pay

## 2022-08-01 ENCOUNTER — Other Ambulatory Visit: Payer: Self-pay

## 2022-08-02 ENCOUNTER — Other Ambulatory Visit (HOSPITAL_COMMUNITY): Payer: Self-pay

## 2022-08-06 ENCOUNTER — Other Ambulatory Visit: Payer: Self-pay

## 2022-08-06 ENCOUNTER — Other Ambulatory Visit (HOSPITAL_COMMUNITY): Payer: Self-pay

## 2022-08-06 MED ORDER — MIDODRINE HCL 10 MG PO TABS
10.0000 mg | ORAL_TABLET | Freq: Every day | ORAL | 6 refills | Status: DC
  Filled 2022-08-06: qty 30, 30d supply, fill #0
  Filled 2022-09-25: qty 30, 30d supply, fill #1
  Filled 2022-10-30: qty 30, 30d supply, fill #2
  Filled 2022-11-26: qty 30, 30d supply, fill #3
  Filled 2023-01-08: qty 30, 30d supply, fill #4

## 2022-08-07 ENCOUNTER — Other Ambulatory Visit (HOSPITAL_COMMUNITY): Payer: Self-pay

## 2022-08-07 MED ORDER — GATIFLOXACIN 0.5 % OP SOLN
1.0000 [drp] | Freq: Four times a day (QID) | OPHTHALMIC | 1 refills | Status: DC
  Filled 2022-08-07: qty 5, 25d supply, fill #0

## 2022-08-07 MED ORDER — KETOROLAC TROMETHAMINE 0.5 % OP SOLN
1.0000 [drp] | Freq: Four times a day (QID) | OPHTHALMIC | 1 refills | Status: DC
  Filled 2022-08-07: qty 10, 50d supply, fill #0

## 2022-08-07 MED ORDER — PREDNISOLONE ACETATE 1 % OP SUSP
1.0000 [drp] | Freq: Four times a day (QID) | OPHTHALMIC | 1 refills | Status: DC
  Filled 2022-08-07 – 2022-08-31 (×2): qty 5, 25d supply, fill #0

## 2022-08-08 ENCOUNTER — Other Ambulatory Visit (HOSPITAL_COMMUNITY): Payer: Self-pay

## 2022-08-09 ENCOUNTER — Other Ambulatory Visit (HOSPITAL_COMMUNITY): Payer: Self-pay

## 2022-08-23 ENCOUNTER — Encounter: Payer: Self-pay | Admitting: Podiatry

## 2022-08-23 ENCOUNTER — Ambulatory Visit (INDEPENDENT_AMBULATORY_CARE_PROVIDER_SITE_OTHER): Payer: Medicare Other | Admitting: Podiatry

## 2022-08-23 DIAGNOSIS — M7752 Other enthesopathy of left foot: Secondary | ICD-10-CM

## 2022-08-23 DIAGNOSIS — M21612 Bunion of left foot: Secondary | ICD-10-CM

## 2022-08-23 DIAGNOSIS — M2012 Hallux valgus (acquired), left foot: Secondary | ICD-10-CM | POA: Diagnosis not present

## 2022-08-23 MED ORDER — TRIAMCINOLONE ACETONIDE 10 MG/ML IJ SUSP
10.0000 mg | Freq: Once | INTRAMUSCULAR | Status: AC
  Administered 2022-08-23: 10 mg

## 2022-08-23 NOTE — Progress Notes (Signed)
Subjective:   Patient ID: Mallory Chavez, female   DOB: 87 y.o.   MRN: [id number]   HPI Patient complaining of painful bunion deformity left and has developed inflammation fluid on the medial side along with lesion formation.  Patient in poor health   ROS      Objective:  Physical Exam  Vascular status found diminishment of the DP PT pulses bilateral and patient is found to have a hypertrophic hyperostotic area on the medial dorsal aspect first metatarsal left lesion formation and fluid buildup around the joint     Assessment:  Inflammatory capsulitis of the first MPJ left along with lesion formation and structural bunion deformity along with moderate vascular disease     Plan:  H&P reviewed with her and caregiver difficulty of this condition discussed bunion correction do not recommend currently as I am concerned about circulatory status and healing.  Today I went ahead did sterile prep I injected the area of fluid accumulation 3 mg Dexasone Kenalog 5 mg Xylocaine with patient realizing that this will not solve bony structure.  Discussed bunion correction can hold off currently

## 2022-08-28 ENCOUNTER — Other Ambulatory Visit (HOSPITAL_COMMUNITY): Payer: Self-pay

## 2022-08-29 ENCOUNTER — Other Ambulatory Visit (HOSPITAL_COMMUNITY): Payer: Self-pay

## 2022-08-31 ENCOUNTER — Other Ambulatory Visit (HOSPITAL_COMMUNITY): Payer: Self-pay

## 2022-09-13 ENCOUNTER — Other Ambulatory Visit: Payer: Self-pay

## 2022-09-13 ENCOUNTER — Other Ambulatory Visit (HOSPITAL_COMMUNITY): Payer: Self-pay

## 2022-09-13 MED ORDER — GATIFLOXACIN 0.5 % OP SOLN
1.0000 [drp] | Freq: Four times a day (QID) | OPHTHALMIC | 1 refills | Status: DC
Start: 2022-09-13 — End: 2023-01-18
  Filled 2022-09-13: qty 5, 25d supply, fill #0

## 2022-09-13 MED ORDER — PREDNISOLONE ACETATE 1 % OP SUSP
1.0000 [drp] | Freq: Four times a day (QID) | OPHTHALMIC | 1 refills | Status: DC
Start: 2022-09-13 — End: 2023-01-18
  Filled 2022-10-13 (×2): qty 5, 25d supply, fill #0

## 2022-09-13 MED ORDER — KETOROLAC TROMETHAMINE 0.5 % OP SOLN
1.0000 [drp] | Freq: Four times a day (QID) | OPHTHALMIC | 1 refills | Status: DC
Start: 2022-09-13 — End: 2023-01-18
  Filled 2022-09-13: qty 10, 25d supply, fill #0
  Filled 2022-10-13 (×3): qty 10, 25d supply, fill #1

## 2022-09-14 ENCOUNTER — Other Ambulatory Visit (HOSPITAL_COMMUNITY): Payer: Self-pay

## 2022-09-25 ENCOUNTER — Other Ambulatory Visit (HOSPITAL_COMMUNITY): Payer: Self-pay

## 2022-10-13 ENCOUNTER — Other Ambulatory Visit (HOSPITAL_COMMUNITY): Payer: Self-pay

## 2022-10-15 ENCOUNTER — Other Ambulatory Visit (HOSPITAL_COMMUNITY): Payer: Self-pay

## 2022-10-16 ENCOUNTER — Other Ambulatory Visit (HOSPITAL_COMMUNITY): Payer: Self-pay

## 2022-10-24 ENCOUNTER — Other Ambulatory Visit: Payer: Self-pay | Admitting: Internal Medicine

## 2022-10-24 ENCOUNTER — Other Ambulatory Visit (HOSPITAL_COMMUNITY): Payer: Self-pay

## 2022-10-26 ENCOUNTER — Other Ambulatory Visit (HOSPITAL_COMMUNITY): Payer: Self-pay

## 2022-10-26 MED ORDER — BUDESONIDE-FORMOTEROL FUMARATE 160-4.5 MCG/ACT IN AERO
2.0000 | INHALATION_SPRAY | Freq: Two times a day (BID) | RESPIRATORY_TRACT | 0 refills | Status: DC
  Filled 2022-10-26: qty 10.2, 30d supply, fill #0

## 2022-10-29 ENCOUNTER — Other Ambulatory Visit (HOSPITAL_COMMUNITY): Payer: Self-pay

## 2022-10-31 ENCOUNTER — Other Ambulatory Visit (HOSPITAL_COMMUNITY): Payer: Self-pay

## 2022-11-16 ENCOUNTER — Other Ambulatory Visit (HOSPITAL_COMMUNITY): Payer: Self-pay

## 2022-11-20 ENCOUNTER — Other Ambulatory Visit (HOSPITAL_COMMUNITY): Payer: Self-pay

## 2022-11-27 ENCOUNTER — Other Ambulatory Visit (HOSPITAL_COMMUNITY): Payer: Self-pay

## 2022-11-27 ENCOUNTER — Other Ambulatory Visit: Payer: Self-pay

## 2022-11-27 ENCOUNTER — Telehealth: Payer: Self-pay | Admitting: Internal Medicine

## 2022-11-27 MED ORDER — BUDESONIDE-FORMOTEROL FUMARATE 160-4.5 MCG/ACT IN AERO
2.0000 | INHALATION_SPRAY | Freq: Two times a day (BID) | RESPIRATORY_TRACT | 0 refills | Status: DC
  Filled 2022-11-27: qty 10.2, 30d supply, fill #0

## 2022-11-27 NOTE — Telephone Encounter (Signed)
Pt needs a refill for symbicort

## 2022-11-28 ENCOUNTER — Other Ambulatory Visit (HOSPITAL_COMMUNITY): Payer: Self-pay

## 2022-11-28 ENCOUNTER — Other Ambulatory Visit: Payer: Self-pay

## 2022-11-28 NOTE — Telephone Encounter (Signed)
ATC X1 LVM for patient. Please advise symbicort has been sent to pharmacy

## 2022-12-24 ENCOUNTER — Other Ambulatory Visit: Payer: Self-pay | Admitting: Internal Medicine

## 2022-12-24 ENCOUNTER — Other Ambulatory Visit (HOSPITAL_COMMUNITY): Payer: Self-pay

## 2022-12-25 ENCOUNTER — Other Ambulatory Visit: Payer: Self-pay

## 2022-12-25 ENCOUNTER — Other Ambulatory Visit (HOSPITAL_COMMUNITY): Payer: Self-pay

## 2022-12-25 MED ORDER — BUDESONIDE-FORMOTEROL FUMARATE 160-4.5 MCG/ACT IN AERO
2.0000 | INHALATION_SPRAY | Freq: Two times a day (BID) | RESPIRATORY_TRACT | 0 refills | Status: DC
  Filled 2022-12-25: qty 10.2, 30d supply, fill #0

## 2022-12-26 ENCOUNTER — Other Ambulatory Visit (HOSPITAL_COMMUNITY): Payer: Self-pay

## 2023-01-02 ENCOUNTER — Ambulatory Visit: Payer: Medicare Other | Admitting: Vascular Surgery

## 2023-01-07 NOTE — H&P (View-Only) (Signed)
[facility] [place]  ASSESSMENT / PLAN: 87 y.o. 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  There are 2 areas of mild ulceration in the fistula.  I think the best course of action would be to revise the fistula and excised the aneurysmal segments and ulcerated skin.  I counseled her she will need a tunneled dialysis catheter for 6 weeks while she heals from this procedure.  We will look for a nondialysis day in the near future to do this for her.  CHIEF COMPLAINT: AV fistula with ulcer  HISTORY OF PRESENT ILLNESS: Mallory Chavez is a 87 y.o. female referred to clinic by Dr. Verna Czech for evaluation of ulcerated arteriovenous fistula.  The patient had a brachiocephalic arteriovenous fistula created for her 4 years ago.  This is worked very well for her.  The fistula has undergone some aneurysmal degeneration.  The fistula now has 2 areas of hypopigmented skin with mild ulceration.  The patient has not had any bleeding.  The fistula is working well at dialysis.  I counseled her about these findings and the recommendation for repair when ulceration is identified.  Past Medical History:  Diagnosis Date   Allergy    Arthritis    Asthma    CHF (congestive heart failure) (HCC) 1980   controlled with meds   Chronic respiratory failure (HCC)    COPD (chronic obstructive pulmonary disease) (HCC)    ESRD on hemodialysis (HCC)    MWF at [facility]   GERD (gastroesophageal reflux disease)    Hyperglycemia    Hyperlipidemia    Hypertension    Hyponatremia     Past Surgical History:  Procedure Laterality Date   AV FISTULA PLACEMENT Left 07/23/2018   Procedure: ARTERIOVENOUS (AV) FISTULA CREATION;  Surgeon: Maeola Harman, MD;  Location: [facility] OR;  Service: Vascular;  Laterality: Left;   ECTOPIC PREGNANCY SURGERY  1962   INSERTION OF DIALYSIS CATHETER Right 07/23/2018   Procedure: INSERTION OF DIALYSIS CATHETER;  Surgeon: Maeola Harman, MD;  Location: [facility] OR;  Service: Vascular;  Laterality: Right;   PARATHYROID EXPLORATION N/A 01/25/2022   Procedure: NECK EXPLORATION WITH PARATHYROIDECTOMY;  Surgeon: Darnell Level, MD;  Location: [facility] OR;  Service: General;  Laterality: N/A;   TUMOR REMOVAL     lower back/benign and has come back   VAGINAL HYSTERECTOMY      Family History  Problem Relation Age of Onset   Diabetes Mother    Emphysema Father     Social History   Socioeconomic History   Marital status: Widowed    Spouse name: Not on file   Number of children: 1   Years of education: Not on file   Highest education level: Not on file  Occupational History   Not on file  Tobacco Use   Smoking status: Former    Packs/day: 1.50    Years: 20.00    Additional pack years: 0.00    Total pack years: 30.00    Types: Cigarettes    Quit date: 07/02/1992    Years since quitting: 30.5   Smokeless tobacco: Never  Vaping Use   Vaping Use: Never used  Substance and Sexual Activity   Alcohol use: No   Drug use: No   Sexual activity: Not on file  Other Topics Concern   Not on file  Social History Narrative   Not on file   Social Determinants of Health   Financial Resource Strain: Not on file  Food Insecurity: Not on file  Transportation Needs: Not on file  Physical Activity: Not on file  Stress: Not on file  Social Connections: Not on file  Intimate Partner Violence: Not on file    No Known Allergies  Current Outpatient Medications  Medication Sig Dispense Refill   acetaminophen (TYLENOL) 500 MG tablet Take 500 mg by mouth daily as needed for mild pain or moderate pain.     albuterol (VENTOLIN HFA) 108 (90 Base) MCG/ACT inhaler Inhale 2 puffs by mouth into the lungs every 4 hours as needed for wheezing or shortness of breath 6.7 g 3   aspirin EC 81 MG tablet Take 81 mg by mouth daily after supper.     B Complex-C-Zn-Folic Acid (DIALYVITE 800 WITH ZINC) 0.8 MG TABS Take 1 tablet by mouth every  evening.     budesonide-formoterol (SYMBICORT) 160-4.5 MCG/ACT inhaler Inhale 2 puffs into the lungs 2 (two) times daily. 10.2 g 0   Oyster Shell Calcium 500 MG TABS Take 1 tablet (1,250 mg total) by mouth 3 (three) times daily. 90 tablet 1   colchicine 0.6 MG tablet Take 1 tablet by mouth once daily as needed (Patient taking differently: Take 0.6 mg by mouth daily as needed (Gout).) 30 tablet 3   colchicine 0.6 MG tablet Take 1 tablet (0.6 mg total) by mouth 2 (two) times daily. 180 tablet 3   gatifloxacin (ZYMAXID) 0.5 % SOLN Place 1 drop into the left eye 4 (four) times daily as directed. 5 mL 1   gatifloxacin (ZYMAXID) 0.5 % SOLN Place 1 drop into the right eye 4 (four) times daily as directed. 5 mL 1   gatifloxacin (ZYMAXID) 0.5 % SOLN Place 1 drop into the right eye 4 times daily. 5 mL 1   Glycerin-Hypromellose-PEG 400 (DRY EYE RELIEF DROPS OP) Place 1 drop into both eyes at bedtime.     ketorolac (ACULAR) 0.5 % ophthalmic solution Place 1 drop into the left eye 4 (four) times daily as directed as directed. 5 mL 1   ketorolac (ACULAR) 0.5 % ophthalmic solution Place 1 drop into the right eye 4 (four) times daily as directed. 10 mL 1   ketorolac (ACULAR) 0.5 % ophthalmic solution Place 1 drop into the right eye in the morning, at noon, in the evening, and at bedtime. 10 mL 1   lidocaine-prilocaine (EMLA) cream Apply  a small amount to skin as directed to AVF- prior to dialysis 30 g 11   loratadine (CLARITIN) 10 MG tablet Take 10 mg by mouth daily after supper.     midodrine (PROAMATINE) 10 MG tablet Take 1 tablet (10 mg total) by mouth daily as directed. Take just before starting dialysis 30 tablet 6   midodrine (PROAMATINE) 10 MG tablet Take 1 tablet (10 mg total) by mouth daily as directed. Take just before starting dialysis. 30 tablet 6   mirtazapine (REMERON) 7.5 MG tablet Take 1 tablet by mouth at bedtime for appetite. 30 tablet 11   omeprazole (PRILOSEC) 20 MG capsule Take 1 capsule (20  mg total) by mouth 2 (two) times daily for 10 days. 20 capsule 0   OXYGEN Inhale 1 L into the lungs See admin instructions. with sleep and exertion as needed AHC     polyethylene glycol (MIRALAX / GLYCOLAX) 17 g packet Take 17 g by mouth every other day. 14 each 0   potassium chloride SA (KLOR-CON M) 20 MEQ tablet Take 2 tablets by mouth daily. 7 tablet 0  prednisoLONE acetate (PRED FORTE) 1 % ophthalmic suspension Place 1 drop into the left eye 4 (four) times daily as directed. Shake well before use 5 mL 1   prednisoLONE acetate (PRED FORTE) 1 % ophthalmic suspension Place 1 drop into the right eye 4 (four) times daily as directed 5 mL 1   prednisoLONE acetate (PRED FORTE) 1 % ophthalmic suspension Place 1 drop into the right eye 4 (four) times daily. 5 mL 1   No current facility-administered medications for this visit.    PHYSICAL EXAM There were no vitals filed for this visit.  Elderly woman in no acute distress Regular rate and rhythm Unlabored breathing Left upper extremity AV fistula with 2 areas of ulceration, measuring approximately 1 to 2 cm.  The fistula has a smooth thrill.  PERTINENT LABORATORY AND RADIOLOGIC DATA  Most recent CBC    Latest Ref Rng & Units 02/16/2022    8:53 AM 02/15/2022    1:10 AM 02/14/2022    8:00 AM  CBC  WBC 4.0 - 10.5 K/uL 7.3  7.7  10.3   Hemoglobin 12.0 - 15.0 g/dL 8.9  9.0  9.0   Hematocrit 36.0 - 46.0 % 28.3  28.2  28.2   Platelets 150 - 400 K/uL 221  210  227      Most recent CMP    Latest Ref Rng & Units 02/16/2022    1:24 AM 02/15/2022    1:10 AM 02/14/2022    8:00 AM  CMP  Glucose 70 - 99 mg/dL 88  83  95   BUN 8 - 23 mg/dL 15  9  21    Creatinine 0.44 - 1.00 mg/dL 6.04  5.40  9.81   Sodium 135 - 145 mmol/L 134  135  134   Potassium 3.5 - 5.1 mmol/L 3.3  3.2  3.8   Chloride 98 - 111 mmol/L 91  95  93   CO2 22 - 32 mmol/L 29  27  28    Calcium 8.9 - 10.3 mg/dL 7.8  7.7  9.8    Javar Eshbach N. Lenell Antu, MD FACS [facility] Phone Number: [phone] 01/07/2023 8:38 AM   Total time spent on preparing this encounter including chart review, data review, collecting history, examining the patient, coordinating care for this new patient, 60 minutes.  Portions of this report may have been transcribed using voice recognition software.  Every effort has been made to ensure accuracy; however, inadvertent computerized transcription errors may still be present.

## 2023-01-07 NOTE — Progress Notes (Unsigned)
[facility] OF [place]  ASSESSMENT / PLAN: 87 y.o. 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   CHIEF COMPLAINT: ***  HISTORY OF PRESENT ILLNESS: Mallory Chavez is a 87 y.o. female ***  Past Medical History:  Diagnosis Date   Allergy    Arthritis    Asthma    CHF (congestive heart failure) (HCC) 1980   controlled with meds   Chronic respiratory failure (HCC)    COPD (chronic obstructive pulmonary disease) (HCC)    ESRD on hemodialysis (HCC)    MWF at [facility]   GERD (gastroesophageal reflux disease)    Hyperglycemia    Hyperlipidemia    Hypertension    Hyponatremia     Past Surgical History:  Procedure Laterality Date   AV FISTULA PLACEMENT Left 07/23/2018   Procedure: ARTERIOVENOUS (AV) FISTULA CREATION;  Surgeon: Maeola Harman, MD;  Location: [facility] OR;  Service: Vascular;  Laterality: Left;   ECTOPIC PREGNANCY SURGERY  1962   INSERTION OF DIALYSIS CATHETER Right 07/23/2018   Procedure: INSERTION OF DIALYSIS CATHETER;  Surgeon: Maeola Harman, MD;  Location: [facility] OR;  Service: Vascular;  Laterality: Right;   PARATHYROID EXPLORATION N/A 01/25/2022   Procedure: NECK EXPLORATION WITH PARATHYROIDECTOMY;  Surgeon: Darnell Level, MD;  Location: [facility] OR;  Service: General;  Laterality: N/A;   TUMOR REMOVAL     lower back/benign and has come back   VAGINAL HYSTERECTOMY      Family History  Problem Relation Age of Onset   Diabetes Mother    Emphysema Father     Social History   Socioeconomic History   Marital status: Widowed    Spouse name: Not on file   Number of children: 1   Years of education: Not on file   Highest education level: Not on file  Occupational History   Not on file  Tobacco Use   Smoking status: Former    Packs/day: 1.50    Years: 20.00    Additional pack years: 0.00    Total pack years: 30.00    Types: Cigarettes    Quit date: 07/02/1992    Years since quitting: 30.5    Smokeless tobacco: Never  Vaping Use   Vaping Use: Never used  Substance and Sexual Activity   Alcohol use: No   Drug use: No   Sexual activity: Not on file  Other Topics Concern   Not on file  Social History Narrative   Not on file   Social Determinants of Health   Financial Resource Strain: Not on file  Food Insecurity: Not on file  Transportation Needs: Not on file  Physical Activity: Not on file  Stress: Not on file  Social Connections: Not on file  Intimate Partner Violence: Not on file    No Known Allergies  Current Outpatient Medications  Medication Sig Dispense Refill   acetaminophen (TYLENOL) 500 MG tablet Take 500 mg by mouth daily as needed for mild pain or moderate pain.     albuterol (VENTOLIN HFA) 108 (90 Base) MCG/ACT inhaler Inhale 2 puffs by mouth into the lungs every 4 hours as needed for wheezing or shortness of breath 6.7 g 3   aspirin EC 81 MG tablet Take 81 mg by mouth daily after supper.     B Complex-C-Zn-Folic Acid (DIALYVITE 800 WITH ZINC) 0.8 MG TABS Take 1 tablet by mouth every evening.     budesonide-formoterol (SYMBICORT) 160-4.5 MCG/ACT inhaler Inhale 2 puffs into the lungs 2 (two)  times daily. 10.2 g 0   Oyster Shell Calcium 500 MG TABS Take 1 tablet (1,250 mg total) by mouth 3 (three) times daily. 90 tablet 1   colchicine 0.6 MG tablet Take 1 tablet by mouth once daily as needed (Patient taking differently: Take 0.6 mg by mouth daily as needed (Gout).) 30 tablet 3   colchicine 0.6 MG tablet Take 1 tablet (0.6 mg total) by mouth 2 (two) times daily. 180 tablet 3   gatifloxacin (ZYMAXID) 0.5 % SOLN Place 1 drop into the left eye 4 (four) times daily as directed. 5 mL 1   gatifloxacin (ZYMAXID) 0.5 % SOLN Place 1 drop into the right eye 4 (four) times daily as directed. 5 mL 1   gatifloxacin (ZYMAXID) 0.5 % SOLN Place 1 drop into the right eye 4 times daily. 5 mL 1   Glycerin-Hypromellose-PEG 400 (DRY EYE RELIEF DROPS OP) Place 1 drop into both eyes  at bedtime.     ketorolac (ACULAR) 0.5 % ophthalmic solution Place 1 drop into the left eye 4 (four) times daily as directed as directed. 5 mL 1   ketorolac (ACULAR) 0.5 % ophthalmic solution Place 1 drop into the right eye 4 (four) times daily as directed. 10 mL 1   ketorolac (ACULAR) 0.5 % ophthalmic solution Place 1 drop into the right eye in the morning, at noon, in the evening, and at bedtime. 10 mL 1   lidocaine-prilocaine (EMLA) cream Apply  a small amount to skin as directed to AVF- prior to dialysis 30 g 11   loratadine (CLARITIN) 10 MG tablet Take 10 mg by mouth daily after supper.     midodrine (PROAMATINE) 10 MG tablet Take 1 tablet (10 mg total) by mouth daily as directed. Take just before starting dialysis 30 tablet 6   midodrine (PROAMATINE) 10 MG tablet Take 1 tablet (10 mg total) by mouth daily as directed. Take just before starting dialysis. 30 tablet 6   mirtazapine (REMERON) 7.5 MG tablet Take 1 tablet by mouth at bedtime for appetite. 30 tablet 11   omeprazole (PRILOSEC) 20 MG capsule Take 1 capsule (20 mg total) by mouth 2 (two) times daily for 10 days. 20 capsule 0   OXYGEN Inhale 1 L into the lungs See admin instructions. with sleep and exertion as needed AHC     polyethylene glycol (MIRALAX / GLYCOLAX) 17 g packet Take 17 g by mouth every other day. 14 each 0   potassium chloride SA (KLOR-CON M) 20 MEQ tablet Take 2 tablets by mouth daily. 7 tablet 0   prednisoLONE acetate (PRED FORTE) 1 % ophthalmic suspension Place 1 drop into the left eye 4 (four) times daily as directed. Shake well before use 5 mL 1   prednisoLONE acetate (PRED FORTE) 1 % ophthalmic suspension Place 1 drop into the right eye 4 (four) times daily as directed 5 mL 1   prednisoLONE acetate (PRED FORTE) 1 % ophthalmic suspension Place 1 drop into the right eye 4 (four) times daily. 5 mL 1   No current facility-administered medications for this visit.    PHYSICAL EXAM There were no vitals filed for this  visit.  Constitutional: *** appearing. *** distress. Appears *** nourished.  Neurologic: CN ***. *** focal findings. *** sensory loss. Psychiatric: *** Mood and affect symmetric and appropriate. Eyes: *** No icterus. No conjunctival pallor. Ears, nose, throat: *** mucous membranes moist. Midline trachea.  Cardiac: *** rate and rhythm.  Respiratory: *** unlabored. Abdominal: *** soft, non-tender,  non-distended.  Peripheral vascular: *** Extremity: *** edema. *** cyanosis. *** pallor.  Skin: *** gangrene. *** ulceration.  Lymphatic: *** Stemmer's sign. *** palpable lymphadenopathy.    PERTINENT LABORATORY AND RADIOLOGIC DATA  Most recent CBC    Latest Ref Rng & Units 02/16/2022    8:53 AM 02/15/2022    1:10 AM 02/14/2022    8:00 AM  CBC  WBC 4.0 - 10.5 K/uL 7.3  7.7  10.3   Hemoglobin 12.0 - 15.0 g/dL 8.9  9.0  9.0   Hematocrit 36.0 - 46.0 % 28.3  28.2  28.2   Platelets 150 - 400 K/uL 221  210  227      Most recent CMP    Latest Ref Rng & Units 02/16/2022    1:24 AM 02/15/2022    1:10 AM 02/14/2022    8:00 AM  CMP  Glucose 70 - 99 mg/dL 88  83  95   BUN 8 - 23 mg/dL 15  9  21    Creatinine 0.44 - 1.00 mg/dL 1.61  0.96  0.45   Sodium 135 - 145 mmol/L 134  135  134   Potassium 3.5 - 5.1 mmol/L 3.3  3.2  3.8   Chloride 98 - 111 mmol/L 91  95  93   CO2 22 - 32 mmol/L 29  27  28    Calcium 8.9 - 10.3 mg/dL 7.8  7.7  9.8     Renal function CrCl cannot be calculated (Patient's most recent lab result is older than the maximum 21 days allowed.).  Hgb A1c MFr Bld (%)  Date Value  06/16/2016 6.1 (H)    No results found for: "LDLCALC", "LDLC", "HIRISKLDL", "POCLDL", "LDLDIRECT", "REALLDLC", "TOTLDLC"   Vascular Imaging: ***  Louiza Moor N. Lenell Antu, MD FACS [facility] Phone Number: [phone] 01/07/2023 8:38 AM   Total time spent on preparing this encounter including chart review, data review, collecting history, examining the patient,  coordinating care for this {[id number]}  Portions of this report may have been transcribed using voice recognition software.  Every effort has been made to ensure accuracy; however, inadvertent computerized transcription errors may still be present.

## 2023-01-08 ENCOUNTER — Encounter: Payer: Self-pay | Admitting: Vascular Surgery

## 2023-01-08 ENCOUNTER — Other Ambulatory Visit (HOSPITAL_COMMUNITY): Payer: Self-pay

## 2023-01-08 ENCOUNTER — Ambulatory Visit (INDEPENDENT_AMBULATORY_CARE_PROVIDER_SITE_OTHER): Payer: Medicare Other | Admitting: Vascular Surgery

## 2023-01-08 ENCOUNTER — Other Ambulatory Visit: Payer: Self-pay

## 2023-01-08 VITALS — BP 105/59 | HR 91 | Temp 97.9°F | Resp 20 | Ht 64.0 in | Wt 147.0 lb

## 2023-01-08 DIAGNOSIS — Z992 Dependence on renal dialysis: Secondary | ICD-10-CM | POA: Diagnosis not present

## 2023-01-08 DIAGNOSIS — N186 End stage renal disease: Secondary | ICD-10-CM | POA: Diagnosis not present

## 2023-01-09 ENCOUNTER — Other Ambulatory Visit: Payer: Self-pay | Admitting: *Deleted

## 2023-01-09 DIAGNOSIS — Z992 Dependence on renal dialysis: Secondary | ICD-10-CM

## 2023-01-16 NOTE — Progress Notes (Signed)
Subjective:   Patient ID: Mallory Chavez, female    DOB: 1935-07-29 MRN: [id number]   Brief patient profile:  68  yobf quit smoking around 1994 bothered by some seasonal rhinitis spring > fall x around 1990 then 2012 developed breathing problems dx asthma variably responsive to rx referred 10/01/2012 to pulmonary clinic by Dr Mallory Chavez and proved to have GOLD II COPD 10/2012    History of Present Illness  10/01/2012 1st pulmonary /Mallory Chavez  On ACEi cc abruptly 2012 shortness of breath assoc with cough min productive mostly clear. Sob x across a parking lot. Not responding to multiple asthma meds. rec benicar 40/25 one half daily in place of lisnopril GERD diet    02/06/2018  f/u ov/Mallory Chavez re:  GOLD II/ 02 2lpm hs and prn daytime  Chief Complaint  Patient presents with   Follow-up    Increased SOB x 1 month. She has been using her albuterol inhaler 2 x daily on average.   Dyspnea:  Uses [employer] parking / struggles to do [employer] on 2lpm. Gradually worse since weather turned hot  Cough: none  SABA use: as above  02: 2lpm does not titrate  rec No change in pulmonary medications Adjust your 02 to keep your saturations above 90% at all times Please schedule a follow up visit in 3 months but call sooner if needed  - add:  Echo needed to r/o cor pulmonale  - Echo 02/14/2018 - Left ventricle: The cavity size was normal. Wall thickness was   increased in a pattern of mild LVH. Systolic function was mildly   reduced. The estimated ejection fraction was in the range of 45%   to 50%. Diffuse hypokinesis. Doppler parameters are consistent   with abnormal left ventricular relaxation (grade 1 diastolic   dysfunction). - Aortic valve: Trileaflet; mildly thickened, mildly calcified   leaflets. - Left atrium: The atrium was mildly dilated. - Pulmonary arteries: Systolic pressure was moderately increased.   PA peak pressure: 48 mm Hg (S).    03/30/2021  f/u ov/Mallory Chavez re: copd 2/ resp failure  maint on  symb 1602bid    HD tiw  Chief Complaint  Patient presents with   Follow-up    Chest congestion in the evenings recently, not coughing much. She states mucus gets stuck in her throat.    Dyspnea:  walks with cane, some outdoor walking  Cough: worse in afternoons most days x one week assoc hoarness Sleeping: fine/ bricks under headboard  SABA use: rarely 02: prn once or twice a week  Covid status:   vax 2  Rec Pantoprazole (protonix) 40 mg   Take  30-60 min before first meal of the day and Pepcid (famotidine)  40 mg after supper until return to office  Work on inhaler technique:        01/17/2023  f/u ov/Mallory Chavez re: GOLD 2/ HD MWF   maint on symbicort 160 2bid   Chief Complaint  Patient presents with   Follow-up    Doing well  Dyspnea:  walks with cane / still does [employer] s 02  Cough: none  Sleeping: bricks under headboard s resp cc  SABA use: sometimes walking  02: occ at hs      No obvious day to day or daytime variability or assoc excess/ purulent sputum or mucus plugs or hemoptysis or cp or chest tightness, subjective wheeze or overt sinus or hb symptoms.    Also denies any obvious fluctuation of symptoms with  weather or environmental changes or other aggravating or alleviating factors except as outlined above   No unusual exposure hx or h/o childhood pna/ asthma or knowledge of premature birth.  Current Allergies, Complete Past Medical History, Past Surgical History, Family History, and Social History were reviewed in [employer] record.  ROS  The following are not active complaints unless bolded Hoarseness, sore throat, dysphagia, dental problems, itching, sneezing,  nasal congestion or discharge of excess mucus or purulent secretions, ear ache,   fever, chills, sweats, unintended wt loss or wt gain, classically pleuritic or exertional cp,  orthopnea pnd or arm/hand swelling  or leg swelling, presyncope, palpitations, abdominal pain, anorexia, nausea,  vomiting, diarrhea  or change in bowel habits or change in bladder habits, change in stools or change in urine, dysuria, hematuria,  rash, arthralgias, visual complaints, headache, numbness, weakness or ataxia or problems with walking or coordination,  change in mood or  memory.        Current Meds  Medication Sig   acetaminophen (TYLENOL) 500 MG tablet Take 500 mg by mouth daily as needed for mild pain or moderate pain.   albuterol (VENTOLIN HFA) 108 (90 Base) MCG/ACT inhaler Inhale 2 puffs by mouth into the lungs every 4 hours as needed for wheezing or shortness of breath   aspirin EC 81 MG tablet Take 81 mg by mouth daily after supper.   B Complex-C-Zn-Folic Acid (DIALYVITE 800 WITH ZINC) 0.8 MG TABS Take 1 tablet by mouth every evening.   budesonide-formoterol (SYMBICORT) 160-4.5 MCG/ACT inhaler Inhale 2 puffs into the lungs 2 (two) times daily.   lidocaine-prilocaine (EMLA) cream Apply  a small amount to skin as directed to AVF- prior to dialysis   loratadine (CLARITIN) 10 MG tablet Take 10 mg by mouth daily after supper.   OXYGEN Inhale 1 L into the lungs See admin instructions. with sleep and exertion as needed AHC                   Objective:   Physical Exam  Wts  01/17/2023      150  09/26/2021      142  03/30/2021      150  02/23/2019     166  11/10/2012       206 vs 12/19/2012 206  Vs 207 01/16/2013 > 04/27/2013  210 > 06/15/2013  208  >211 07/27/2013 > 04/19/14 201 >203 05/10/2014 > 07/05/2014 201 >10/07/2014 200>  01/10/2015   195 > 09/13/2015  193 > 03/15/2016  192  > 12/20/2016 185 > 08/11/2017  189 > 02/06/2018  186 >  05/19/2018  177 > 08/25/2018   170   Vital signs reviewed  01/17/2023  - Note at rest 02 sats  96% on RA   General appearance:    pleasant amb bf nad    HEENT : Oropharynx  clear       NECK :  without  apparent JVD/ palpable Nodes/TM    LUNGS: no acc muscle use,  Mild barrel  contour chest wall with bilateral  Distant bs s audible wheeze and  without cough on insp  or exp maneuvers  and mild  Hyperresonant  to  percussion bilaterally     CV:  RRR  no s3 or murmur or increase in P2, and no edema   ABD:  soft and nontender with pos end  insp Hoover's  in the supine position.  No bruits or organomegaly appreciated   MS:  Nl  gait/ ext warm without deformities Or obvious joint restrictions  calf tenderness, cyanosis or clubbing     SKIN: warm and dry without lesions    NEURO:  alert, approp, nl sensorium with  no motor or cerebellar deficits apparent.        Assessment & Plan:

## 2023-01-17 ENCOUNTER — Encounter: Payer: Self-pay | Admitting: Internal Medicine

## 2023-01-17 ENCOUNTER — Other Ambulatory Visit (HOSPITAL_COMMUNITY): Payer: Self-pay

## 2023-01-17 ENCOUNTER — Ambulatory Visit (INDEPENDENT_AMBULATORY_CARE_PROVIDER_SITE_OTHER): Payer: Medicare Other | Admitting: Internal Medicine

## 2023-01-17 VITALS — BP 100/62 | HR 94 | Temp 98.9°F | Ht 64.0 in | Wt 150.4 lb

## 2023-01-17 DIAGNOSIS — J9611 Chronic respiratory failure with hypoxia: Secondary | ICD-10-CM | POA: Diagnosis not present

## 2023-01-17 DIAGNOSIS — J449 Chronic obstructive pulmonary disease, unspecified: Secondary | ICD-10-CM

## 2023-01-17 NOTE — Patient Instructions (Addendum)
Work on inhaler technique: relax and gently blow all the way out then take a nice smooth full deep breath back in, triggering the inhaler at same time you start breathing in.  Hold breath in for at least  5 seconds if you can. Blow out symbicort 160  thru nose. Rinse and gargle with water when done.  If mouth or throat bother you at all,  try brushing teeth/gums/tongue with arm and hammer toothpaste/ make a slurry and gargle and spit out.   Ok to just use symbicort 160 2 puffs each am if doing great at night   Also  Ok to try albuterol 15 min before an activity (on alternating days)  that you know would usually make you short of breath and see if it makes any difference and if makes none then don't take albuterol after activity unless you can't catch your breath as this means it's the resting that helps, not the albuterol.       Please schedule a follow up visit in 12 months but call sooner if needed

## 2023-01-18 NOTE — Assessment & Plan Note (Addendum)
Onset 2015 04/19/14 walked one lap dropped to 80% required 4lpm with walking to maintain sats  ono RA 04/21/14 desat x 7 h x 12 m at < 89%  04/26/2014 rec 2lpm and repeat ono on 2lpm  - 01/10/2015  Walked 2lpmx 2 laps @ 185 ft each stopped due to hip pain, slow pace/ no sob or desat  -Patient Saturations on Room Air at Rest = 95% Patient Saturations on [employer] while Ambulating = 87% Patient Saturations on 2 Liters of oxygen while Ambulating = 97% - 05/19/2018   Walked 2lpm POC x one lap @ 210 ft  stopped due to sob s desats   - 02/23/2019   Walked RA x one lap =  approx 250 ft - stopped due to sob with sats 84% at slow pace though RA sats at rest ok  - ONO on 1lpm  03/08/19  desat x 24 sec only > continue 1 lpm  - 03/30/2021   Walked on RA x  3/4  lap(s) =  approx 200 @ slow pace, stopped due to sob with lowest 02 sats 88%      Only using prn so advised:  F/u q 12 m sooner prn  Make sure you check your oxygen saturation  AT  your highest level of activity (not after you stop)   to be sure it stays over 90% and adjust  02 flow upward to maintain this level if needed but remember to turn it back to previous settings when you stop (to conserve your supply).           Each maintenance medication was reviewed in detail including emphasizing most importantly the difference between maintenance and prns and under what circumstances the prns are to be triggered using an action plan format where appropriate.  Total time for H and P, chart review, counseling, reviewing hfa/02 device(s) and generating customized AVS unique to this office visit / same day charting = 25 min

## 2023-01-18 NOTE — Assessment & Plan Note (Addendum)
Quit smoking  1994 - PFT's 11/10/2012 FEV1 0.85 (52%)ratio 56 and no better p B 2 with DLCO 34 corrects to 60 - doe x 5-10 min stationery bike > add tudorza 11/10/12 > d/c 05/2013 and no change - PFT's  05/19/2018  FEV1 0.85 (56 % ) ratio 58  p 8 % improvement from saba p symb  prior to study with DLCO  27 % corrects to 47   % for alv volume  And ERV = 0.22  - 09/26/2021   continue symbicort 160 2bid  - 01/17/2023  After extensive coaching inhaler device,  effectiveness =    90% hfa p coaching   Mostly well controlled AB at this point so ok to taper symbicort 160 to 2 each am with pm doses prn   Re saba: Re SABA :  I spent extra time with pt today reviewing appropriate use of albuterol for prn use on exertion with the following points: 1) saba is for relief of sob that does not improve by walking a slower pace or resting but rather if the pt does not improve after trying this first. 2) If the pt is convinced, as many are, that saba helps recover from activity faster then it's easy to tell if this is the case by re-challenging : ie stop, take the inhaler, then p 5 minutes try the exact same activity (intensity of workload) that just caused the symptoms and see if they are substantially diminished or not after saba 3) if there is an activity that reproducibly causes the symptoms, try the saba 15 min before the activity on alternate days   If in fact the saba really does help, then fine to continue to use it prn but advised may need to look closer at the maintenance regimen being used to achieve better control of airways disease with exertion.

## 2023-01-23 ENCOUNTER — Other Ambulatory Visit (HOSPITAL_COMMUNITY): Payer: Self-pay

## 2023-01-23 ENCOUNTER — Other Ambulatory Visit: Payer: Self-pay | Admitting: Internal Medicine

## 2023-01-23 ENCOUNTER — Other Ambulatory Visit: Payer: Self-pay

## 2023-01-23 ENCOUNTER — Encounter (HOSPITAL_COMMUNITY): Payer: Self-pay | Admitting: Vascular Surgery

## 2023-01-23 MED ORDER — BUDESONIDE-FORMOTEROL FUMARATE 160-4.5 MCG/ACT IN AERO
2.0000 | INHALATION_SPRAY | Freq: Two times a day (BID) | RESPIRATORY_TRACT | 5 refills | Status: DC
  Filled 2023-01-23: qty 10.2, 30d supply, fill #0
  Filled 2023-02-20: qty 10.2, 30d supply, fill #1

## 2023-01-23 NOTE — Progress Notes (Signed)
SDW call  Patient's daughter, Mallory Chavez was given pre-op instructions over the phone.  She verbalized understanding of instructions provided.     PCP - [facility] Cardiologist - denies Pulmonary: Dr. Sandrea Hughs Nephrologist: Rond[facility], [facility]   PPM/ICD - denies  Chest x-ray - n/a EKG -  DOS, 01/24/2023 Stress Test - ECHO - 07/21/18 Cardiac Cath -   Sleep Study/sleep apnea/CPAP: denies  Non-diabetic  Blood Thinner Instructions: denies Aspirin Instructions:states last dose 01/22/2023   ERAS Protcol - No, NPO  COVID TEST- n/a    Anesthesia review: Yes.  HTN, CHF, COPD, asthma, ESRD, hemo MWF   Patient denies shortness of breath, fever, cough and chest pain over the phone call  Your procedure is scheduled on Thursday January 24, 2023  Report to [facility] Entrance "A" at 0530 A.M., then check in with the Admitting office.  Call this number if you have problems the morning of surgery:  [phone]   If you have any questions prior to your surgery date call [phone]: Open Monday-Friday 8am-4pm If you experience any cold or flu symptoms such as cough, fever, chills, shortness of breath, etc. between now and your scheduled surgery, please notify us at the above number    Remember:  Do not eat or drink after midnight the night before your surgery   Take these medicines the morning of surgery with A SIP OF WATER:  Symbicort  As needed: Albuterol, colchicine  As of today, STOP taking any Aleve, Naproxen, Ibuprofen, Motrin, Advil, Goody's, BC's, all herbal medications, fish oil, and all vitamins.

## 2023-01-23 NOTE — Progress Notes (Signed)
Anesthesia Chart Review: Same day workup  87 yo female with pertinent hx including combined HF, mild AS (mean grad 12 mmHg by echo 07/2018), HTN, Anemia, ESRD on HD M/W/F, former smoker (quit 1994), COPD GOLD II with O2 use at bedtime and prn during the day,   Last seen by pulmonologist Dr. Sherene Sires 01/17/23. Recommended continue Symbicort as well as supplemental O2 at night and with activity to maintain sats >90%. 1 year followup recommended.   Pt will need DOS labs and eval.  EKG 01/23/22: Normal sinus rhythm. Rate 86. ST & T wave abnormality, consider inferolateral ischemia. Prolonged QT (Qtc 509). No significant change from prior.  TTE 07/21/18: - Left ventricle: The cavity size was normal. Wall thickness was    increased in a pattern of moderate LVH. The estimated ejection    fraction was 50%. Diffuse hypokinesis. Doppler parameters are    consistent with abnormal left ventricular relaxation (grade 1    diastolic dysfunction).  - Aortic valve: Trileaflet; moderately calcified leaflets. There    was mild stenosis. Mean gradient (S): 12 mm Hg.  - Mitral valve: There was trivial regurgitation.  - Left atrium: The atrium was mildly dilated.  - Right ventricle: The cavity size was normal. Systolic function    was normal.  - Tricuspid valve: Peak RV-RA gradient (S): 54 mm Hg.  - Pulmonary arteries: PA peak pressure: 62 mm Hg (S).  - Systemic veins: IVC measured 2.2 cm with > 50% respirophasic    variation, suggesting RA pressure 8 mmHg.  - Pericardium, extracardiac: A trivial pericardial effusion was    identified.   Impressions:   - Normal LV size with moderate LV hypertrophy. EF 50%, diffuse    hypokinesis. Normal RV size and systolic function. Mild aortic    stenosis. Moderate pulmonary hypertension.    Zannie Cove [facility] Short Stay Center/Anesthesiology Phone [phone] 01/23/2023 9:38 AM

## 2023-01-23 NOTE — Anesthesia Preprocedure Evaluation (Addendum)
Anesthesia Evaluation  Patient identified by MRN, date of birth, ID band Patient awake    Reviewed: Allergy & Precautions, H&P , NPO status , Patient's Chart, lab work & pertinent test results  Airway Mallampati: II  TM Distance: >3 FB Neck ROM: Full    Dental no notable dental hx. (+) Teeth Intact, Dental Advisory Given   Pulmonary asthma , COPD,  COPD inhaler, former smoker   Pulmonary exam normal breath sounds clear to auscultation       Cardiovascular Exercise Tolerance: Good hypertension, Pt. on medications +CHF and + DOE   Rhythm:Regular Rate:Normal     Neuro/Psych negative neurological ROS  negative psych ROS   GI/Hepatic Neg liver ROS,GERD  ,,  Endo/Other  negative endocrine ROS    Renal/GU ESRF and DialysisRenal disease  negative genitourinary   Musculoskeletal  (+) Arthritis , Osteoarthritis,    Abdominal   Peds  Hematology  (+) Blood dyscrasia, anemia   Anesthesia Other Findings   Reproductive/Obstetrics negative OB ROS                             Anesthesia Physical Anesthesia Plan  ASA: 3  Anesthesia Plan: General   Post-op Pain Management: Tylenol PO (pre-op)*   Induction: Intravenous  PONV Risk Score and Plan: 4 or greater and Ondansetron, Dexamethasone, Propofol infusion and TIVA  Airway Management Planned: Oral ETT  Additional Equipment:   Intra-op Plan:   Post-operative Plan: Extubation in OR  Informed Consent: I have reviewed the patients History and Physical, chart, labs and discussed the procedure including the risks, benefits and alternatives for the proposed anesthesia with the patient or authorized representative who has indicated his/her understanding and acceptance.     Dental advisory given  Plan Discussed with: CRNA  Anesthesia Plan Comments: (PAT note by Antionette Poles, PA-C: 87 yo female with pertinent hx including combined HF, mild AS  (mean grad 12 mmHg by echo 07/2018), HTN, Anemia, ESRD on HD M/W/F, former smoker (quit 1994), COPD GOLD II with O2 use at bedtime and prn during the day,   Last seen by pulmonologist Dr. Sherene Sires 01/17/23. Recommended continue Symbicort as well as supplemental O2 at night and with activity to maintain sats >90%. 1 year followup recommended.   Pt will need DOS labs and eval.  EKG 01/23/22: Normal sinus rhythm. Rate 86. ST & T wave abnormality, consider inferolateral ischemia. Prolonged QT (Qtc 509). No significant change from prior.  TTE 07/21/18: - Left ventricle: The cavity size was normal. Wall thickness was  increased in a pattern of moderate LVH. The estimated ejection  fraction was 50%. Diffuse hypokinesis. Doppler parameters are  consistent with abnormal left ventricular relaxation (grade 1  diastolic dysfunction).  - Aortic valve: Trileaflet; moderately calcified leaflets. There  was mild stenosis. Mean gradient (S): 12 mm Hg.  - Mitral valve: There was trivial regurgitation.  - Left atrium: The atrium was mildly dilated.  - Right ventricle: The cavity size was normal. Systolic function  was normal.  - Tricuspid valve: Peak RV-RA gradient (S): 54 mm Hg.  - Pulmonary arteries: PA peak pressure: 62 mm Hg (S).  - Systemic veins: IVC measured 2.2 cm with > 50% respirophasic  variation, suggesting RA pressure 8 mmHg.  - Pericardium, extracardiac: A trivial pericardial effusion was  identified.   Impressions:   - Normal LV size with moderate LV hypertrophy. EF 50%, diffuse  hypokinesis. Normal RV size and systolic function.  Mild aortic  stenosis. Moderate pulmonary hypertension.   )        Anesthesia Quick Evaluation

## 2023-01-24 ENCOUNTER — Ambulatory Visit (HOSPITAL_BASED_OUTPATIENT_CLINIC_OR_DEPARTMENT_OTHER): Payer: Medicare Other | Admitting: Physician Assistant

## 2023-01-24 ENCOUNTER — Encounter (HOSPITAL_COMMUNITY)
Admission: RE | Disposition: A | Discharge status: Home / Self Care | Payer: Self-pay | Source: Home / Self Care | Attending: Vascular Surgery

## 2023-01-24 ENCOUNTER — Other Ambulatory Visit (HOSPITAL_COMMUNITY): Payer: Self-pay

## 2023-01-24 ENCOUNTER — Ambulatory Visit (HOSPITAL_COMMUNITY): Payer: Medicare Other

## 2023-01-24 ENCOUNTER — Ambulatory Visit (HOSPITAL_COMMUNITY): Payer: Medicare Other | Admitting: Physician Assistant

## 2023-01-24 ENCOUNTER — Other Ambulatory Visit: Payer: Self-pay

## 2023-01-24 ENCOUNTER — Ambulatory Visit (HOSPITAL_COMMUNITY)
Admission: RE | Admit: 2023-01-24 | Discharge: 2023-01-24 | Disposition: A | Discharge status: Home / Self Care | Payer: Medicare Other | Attending: Vascular Surgery | Admitting: Vascular Surgery

## 2023-01-24 DIAGNOSIS — N186 End stage renal disease: Secondary | ICD-10-CM | POA: Diagnosis present

## 2023-01-24 DIAGNOSIS — K219 Gastro-esophageal reflux disease without esophagitis: Secondary | ICD-10-CM | POA: Insufficient documentation

## 2023-01-24 DIAGNOSIS — Z7951 Long term (current) use of inhaled steroids: Secondary | ICD-10-CM | POA: Diagnosis not present

## 2023-01-24 DIAGNOSIS — I5032 Chronic diastolic (congestive) heart failure: Secondary | ICD-10-CM

## 2023-01-24 DIAGNOSIS — Z992 Dependence on renal dialysis: Secondary | ICD-10-CM

## 2023-01-24 DIAGNOSIS — Z87891 Personal history of nicotine dependence: Secondary | ICD-10-CM | POA: Diagnosis not present

## 2023-01-24 DIAGNOSIS — Y828 Other medical devices associated with adverse incidents: Secondary | ICD-10-CM | POA: Diagnosis not present

## 2023-01-24 DIAGNOSIS — I132 Hypertensive heart and chronic kidney disease with heart failure and with stage 5 chronic kidney disease, or end stage renal disease: Secondary | ICD-10-CM

## 2023-01-24 DIAGNOSIS — T82510A Breakdown (mechanical) of surgically created arteriovenous fistula, initial encounter: Secondary | ICD-10-CM | POA: Diagnosis not present

## 2023-01-24 DIAGNOSIS — N185 Chronic kidney disease, stage 5: Secondary | ICD-10-CM | POA: Diagnosis not present

## 2023-01-24 DIAGNOSIS — I509 Heart failure, unspecified: Secondary | ICD-10-CM | POA: Insufficient documentation

## 2023-01-24 DIAGNOSIS — J449 Chronic obstructive pulmonary disease, unspecified: Secondary | ICD-10-CM | POA: Insufficient documentation

## 2023-01-24 DIAGNOSIS — M1990 Unspecified osteoarthritis, unspecified site: Secondary | ICD-10-CM | POA: Insufficient documentation

## 2023-01-24 DIAGNOSIS — E785 Hyperlipidemia, unspecified: Secondary | ICD-10-CM | POA: Insufficient documentation

## 2023-01-24 DIAGNOSIS — T82898A Other specified complication of vascular prosthetic devices, implants and grafts, initial encounter: Secondary | ICD-10-CM | POA: Diagnosis not present

## 2023-01-24 DIAGNOSIS — Z79899 Other long term (current) drug therapy: Secondary | ICD-10-CM | POA: Diagnosis not present

## 2023-01-24 HISTORY — PX: INSERTION OF DIALYSIS CATHETER: SHX1324

## 2023-01-24 HISTORY — PX: REVISON OF ARTERIOVENOUS FISTULA: SHX6074

## 2023-01-24 LAB — POCT I-STAT, CHEM 8
BUN: 25 mg/dL — ABNORMAL HIGH (ref 8–23)
Calcium, Ion: 1.11 mmol/L — ABNORMAL LOW (ref 1.15–1.40)
Chloride: 101 mmol/L (ref 98–111)
Creatinine, Ser: 5.5 mg/dL — ABNORMAL HIGH (ref 0.44–1.00)
Glucose, Bld: 99 mg/dL (ref 70–99)
HCT: 36 % (ref 36.0–46.0)
Hemoglobin: 12.2 g/dL (ref 12.0–15.0)
Potassium: 4.1 mmol/L (ref 3.5–5.1)
Sodium: 138 mmol/L (ref 135–145)
TCO2: 27 mmol/L (ref 22–32)

## 2023-01-24 SURGERY — REVISON OF ARTERIOVENOUS FISTULA
Anesthesia: General | Site: Chest | Laterality: Right

## 2023-01-24 MED ORDER — ACETAMINOPHEN 500 MG PO TABS
1000.0000 mg | ORAL_TABLET | Freq: Once | ORAL | Status: AC
  Administered 2023-01-24: 1000 mg via ORAL
  Filled 2023-01-24: qty 2

## 2023-01-24 MED ORDER — AMISULPRIDE (ANTIEMETIC) 5 MG/2ML IV SOLN
5.0000 mg | Freq: Once | INTRAVENOUS | Status: AC
  Administered 2023-01-24: 5 mg via INTRAVENOUS

## 2023-01-24 MED ORDER — HEPARIN 6000 UNIT IRRIGATION SOLUTION
Status: DC | PRN
  Administered 2023-01-24: 1

## 2023-01-24 MED ORDER — FENTANYL CITRATE (PF) 250 MCG/5ML IJ SOLN
INTRAMUSCULAR | Status: AC
  Filled 2023-01-24: qty 5

## 2023-01-24 MED ORDER — FENTANYL CITRATE (PF) 100 MCG/2ML IJ SOLN
25.0000 ug | INTRAMUSCULAR | Status: DC | PRN
  Administered 2023-01-24 (×4): 25 ug via INTRAVENOUS

## 2023-01-24 MED ORDER — PHENYLEPHRINE 80 MCG/ML (10ML) SYRINGE FOR IV PUSH (FOR BLOOD PRESSURE SUPPORT)
PREFILLED_SYRINGE | INTRAVENOUS | Status: AC
  Filled 2023-01-24: qty 10

## 2023-01-24 MED ORDER — ROCURONIUM BROMIDE 10 MG/ML (PF) SYRINGE
PREFILLED_SYRINGE | INTRAVENOUS | Status: AC
  Filled 2023-01-24: qty 10

## 2023-01-24 MED ORDER — ROCURONIUM BROMIDE 10 MG/ML (PF) SYRINGE
PREFILLED_SYRINGE | INTRAVENOUS | Status: DC | PRN
  Administered 2023-01-24: 40 mg via INTRAVENOUS

## 2023-01-24 MED ORDER — PROPOFOL 1000 MG/100ML IV EMUL
INTRAVENOUS | Status: AC
  Filled 2023-01-24: qty 100

## 2023-01-24 MED ORDER — PROPOFOL 10 MG/ML IV BOLUS
INTRAVENOUS | Status: DC | PRN
  Administered 2023-01-24: 100 mg via INTRAVENOUS

## 2023-01-24 MED ORDER — LIDOCAINE-EPINEPHRINE 1 %-1:100000 IJ SOLN
INTRAMUSCULAR | Status: AC
  Filled 2023-01-24: qty 1

## 2023-01-24 MED ORDER — HEPARIN SODIUM (PORCINE) 1000 UNIT/ML IJ SOLN
INTRAMUSCULAR | Status: AC
  Filled 2023-01-24: qty 10

## 2023-01-24 MED ORDER — DEXAMETHASONE SODIUM PHOSPHATE 10 MG/ML IJ SOLN
INTRAMUSCULAR | Status: AC
  Filled 2023-01-24: qty 1

## 2023-01-24 MED ORDER — OXYCODONE HCL 5 MG PO TABS
5.0000 mg | ORAL_TABLET | Freq: Once | ORAL | Status: AC
  Administered 2023-01-24: 5 mg via ORAL

## 2023-01-24 MED ORDER — DEXAMETHASONE SODIUM PHOSPHATE 10 MG/ML IJ SOLN
INTRAMUSCULAR | Status: DC | PRN
  Administered 2023-01-24: 4 mg via INTRAVENOUS

## 2023-01-24 MED ORDER — HEPARIN 6000 UNIT IRRIGATION SOLUTION
Status: AC
  Filled 2023-01-24: qty 500

## 2023-01-24 MED ORDER — EPHEDRINE 5 MG/ML INJ
INTRAVENOUS | Status: AC
  Filled 2023-01-24: qty 5

## 2023-01-24 MED ORDER — LIDOCAINE 2% (20 MG/ML) 5 ML SYRINGE
INTRAMUSCULAR | Status: AC
  Filled 2023-01-24: qty 5

## 2023-01-24 MED ORDER — CHLORHEXIDINE GLUCONATE 4 % EX SOLN: 60.0000 mL | Freq: Once | CUTANEOUS | Status: DC

## 2023-01-24 MED ORDER — OXYCODONE-ACETAMINOPHEN 5-325 MG PO TABS
1.0000 | ORAL_TABLET | Freq: Four times a day (QID) | ORAL | 0 refills | Status: DC | PRN
  Filled 2023-01-24: qty 15, 4d supply, fill #0

## 2023-01-24 MED ORDER — FENTANYL CITRATE (PF) 100 MCG/2ML IJ SOLN
INTRAMUSCULAR | Status: AC
  Filled 2023-01-24: qty 2

## 2023-01-24 MED ORDER — AMISULPRIDE (ANTIEMETIC) 5 MG/2ML IV SOLN
INTRAVENOUS | Status: AC
  Filled 2023-01-24: qty 2

## 2023-01-24 MED ORDER — LIDOCAINE 2% (20 MG/ML) 5 ML SYRINGE
INTRAMUSCULAR | Status: DC | PRN
  Administered 2023-01-24: 60 mg via INTRAVENOUS

## 2023-01-24 MED ORDER — FENTANYL CITRATE (PF) 250 MCG/5ML IJ SOLN
INTRAMUSCULAR | Status: DC | PRN
  Administered 2023-01-24 (×2): 75 ug via INTRAVENOUS

## 2023-01-24 MED ORDER — SUGAMMADEX SODIUM 200 MG/2ML IV SOLN
INTRAVENOUS | Status: DC | PRN
  Administered 2023-01-24: 200 mg via INTRAVENOUS

## 2023-01-24 MED ORDER — ORAL CARE MOUTH RINSE: 15.0000 mL | Freq: Once | OROMUCOSAL | Status: AC

## 2023-01-24 MED ORDER — HEPARIN SODIUM (PORCINE) 1000 UNIT/ML IJ SOLN
INTRAMUSCULAR | Status: DC | PRN
  Administered 2023-01-24: 3200 [IU] via INTRAVENOUS

## 2023-01-24 MED ORDER — PROPOFOL 10 MG/ML IV BOLUS
INTRAVENOUS | Status: AC
  Filled 2023-01-24: qty 20

## 2023-01-24 MED ORDER — ONDANSETRON HCL 4 MG/2ML IJ SOLN
INTRAMUSCULAR | Status: AC
  Filled 2023-01-24: qty 2

## 2023-01-24 MED ORDER — SUCCINYLCHOLINE CHLORIDE 200 MG/10ML IV SOSY
PREFILLED_SYRINGE | INTRAVENOUS | Status: AC
  Filled 2023-01-24: qty 10

## 2023-01-24 MED ORDER — OXYCODONE HCL 5 MG PO TABS
ORAL_TABLET | ORAL | Status: AC
  Filled 2023-01-24: qty 1

## 2023-01-24 MED ORDER — CHLORHEXIDINE GLUCONATE 0.12 % MT SOLN
15.0000 mL | Freq: Once | OROMUCOSAL | Status: AC
  Administered 2023-01-24: 15 mL via OROMUCOSAL
  Filled 2023-01-24: qty 15

## 2023-01-24 MED ORDER — CEFAZOLIN SODIUM-DEXTROSE 2-4 GM/100ML-% IV SOLN
2.0000 g | INTRAVENOUS | Status: AC
  Administered 2023-01-24: 2 g via INTRAVENOUS
  Filled 2023-01-24: qty 100

## 2023-01-24 MED ORDER — OXYCODONE-ACETAMINOPHEN 5-325 MG PO TABS
1.0000 | ORAL_TABLET | Freq: Four times a day (QID) | ORAL | 0 refills | Status: DC | PRN
  Filled 2023-01-24 (×2): qty 4, 1d supply, fill #0

## 2023-01-24 MED ORDER — PHENYLEPHRINE 80 MCG/ML (10ML) SYRINGE FOR IV PUSH (FOR BLOOD PRESSURE SUPPORT)
PREFILLED_SYRINGE | INTRAVENOUS | Status: DC | PRN
  Administered 2023-01-24 (×4): 160 ug via INTRAVENOUS
  Administered 2023-01-24: 80 ug via INTRAVENOUS
  Administered 2023-01-24: 160 ug via INTRAVENOUS

## 2023-01-24 MED ORDER — ONDANSETRON HCL 4 MG/2ML IJ SOLN
INTRAMUSCULAR | Status: DC | PRN
  Administered 2023-01-24: 4 mg via INTRAVENOUS

## 2023-01-24 MED ORDER — 0.9 % SODIUM CHLORIDE (POUR BTL) OPTIME
TOPICAL | Status: DC | PRN
  Administered 2023-01-24: 1000 mL

## 2023-01-24 MED ORDER — SODIUM CHLORIDE 0.9 % IV SOLN: INTRAVENOUS | Status: DC

## 2023-01-24 SURGICAL SUPPLY — 65 items
ADH SKN CLS APL DERMABOND .7 (GAUZE/BANDAGES/DRESSINGS) ×2
APL PRP STRL LF DISP 70% ISPRP (MISCELLANEOUS) ×2
APL SKNCLS STERI-STRIP NONHPOA (GAUZE/BANDAGES/DRESSINGS) ×4
ARMBAND PINK RESTRICT EXTREMIT (MISCELLANEOUS) ×3 IMPLANT
BAG COUNTER SPONGE SURGICOUNT (BAG) ×3 IMPLANT
BAG DECANTER FOR FLEXI CONT (MISCELLANEOUS) ×3 IMPLANT
BAG SPNG CNTER NS LX DISP (BAG) ×2
BENZOIN TINCTURE PRP APPL 2/3 (GAUZE/BANDAGES/DRESSINGS) ×3 IMPLANT
BIOPATCH RED 1 DISK 7.0 (GAUZE/BANDAGES/DRESSINGS) ×3 IMPLANT
BNDG CMPR 5X4 CHSV STRCH STRL (GAUZE/BANDAGES/DRESSINGS) ×2
BNDG COHESIVE 4X5 TAN STRL LF (GAUZE/BANDAGES/DRESSINGS) IMPLANT
CANISTER SUCT 3000ML PPV (MISCELLANEOUS) ×3 IMPLANT
CANNULA VESSEL 3MM 2 BLNT TIP (CANNULA) ×3 IMPLANT
CATH PALINDROME-P 19CM W/VT (CATHETERS) IMPLANT
CATH PALINDROME-P 23CM W/VT (CATHETERS) IMPLANT
CATH PALINDROME-P 28CM W/VT (CATHETERS) IMPLANT
CHLORAPREP W/TINT 26 (MISCELLANEOUS) ×3 IMPLANT
COVER PROBE W GEL 5X96 (DRAPES) ×3 IMPLANT
COVER SURGICAL LIGHT HANDLE (MISCELLANEOUS) ×3 IMPLANT
DERMABOND ADVANCED .7 DNX12 (GAUZE/BANDAGES/DRESSINGS) IMPLANT
DRAPE C-ARM 42X72 X-RAY (DRAPES) ×3 IMPLANT
DRAPE CHEST BREAST 15X10 FENES (DRAPES) ×3 IMPLANT
DRSG TEGADERM 4X4.75 (GAUZE/BANDAGES/DRESSINGS) IMPLANT
ELECT REM PT RETURN 9FT ADLT (ELECTROSURGICAL) ×2
ELECTRODE REM PT RTRN 9FT ADLT (ELECTROSURGICAL) ×3 IMPLANT
GAUZE 4X4 16PLY ~~LOC~~+RFID DBL (SPONGE) ×3 IMPLANT
GAUZE SPONGE 4X4 12PLY STRL (GAUZE/BANDAGES/DRESSINGS) ×3 IMPLANT
GLOVE BIO SURGEON STRL SZ8 (GLOVE) ×3 IMPLANT
GOWN STRL REUS W/ TWL LRG LVL3 (GOWN DISPOSABLE) ×6 IMPLANT
GOWN STRL REUS W/ TWL XL LVL3 (GOWN DISPOSABLE) ×3 IMPLANT
GOWN STRL REUS W/TWL LRG LVL3 (GOWN DISPOSABLE) ×4
GOWN STRL REUS W/TWL XL LVL3 (GOWN DISPOSABLE) ×2
INSERT FOGARTY SM (MISCELLANEOUS) IMPLANT
KIT BASIN OR (CUSTOM PROCEDURE TRAY) ×3 IMPLANT
KIT PALINDROME-P 55CM (CATHETERS) IMPLANT
KIT TURNOVER KIT B (KITS) ×3 IMPLANT
NDL 18GX1X1/2 (RX/OR ONLY) (NEEDLE) ×3 IMPLANT
NDL HYPO 25GX1X1/2 BEV (NEEDLE) ×3 IMPLANT
NEEDLE 18GX1X1/2 (RX/OR ONLY) (NEEDLE) ×2
NEEDLE HYPO 25GX1X1/2 BEV (NEEDLE) ×2
NS IRRIG 1000ML POUR BTL (IV SOLUTION) ×3 IMPLANT
PACK BASIC III (CUSTOM PROCEDURE TRAY) ×2
PACK CV ACCESS (CUSTOM PROCEDURE TRAY) ×3 IMPLANT
PACK SRG BSC III STRL LF ECLPS (CUSTOM PROCEDURE TRAY) ×3 IMPLANT
PAD ARMBOARD 7.5X6 YLW CONV (MISCELLANEOUS) ×6 IMPLANT
SET MICROPUNCTURE 5F STIFF (MISCELLANEOUS) ×3 IMPLANT
SLING ARM FOAM STRAP LRG (SOFTGOODS) IMPLANT
SLING ARM FOAM STRAP MED (SOFTGOODS) IMPLANT
SOAP 2 % CHG 4 OZ (WOUND CARE) ×3 IMPLANT
STRIP CLOSURE SKIN 1/2X4 (GAUZE/BANDAGES/DRESSINGS) ×3 IMPLANT
SUT ETHILON 3 0 PS 1 (SUTURE) ×3 IMPLANT
SUT MNCRL AB 4-0 PS2 18 (SUTURE) ×3 IMPLANT
SUT PROLENE 5 0 C 1 24 (SUTURE) IMPLANT
SUT PROLENE 6 0 BV (SUTURE) ×3 IMPLANT
SUT VIC AB 3-0 SH 27 (SUTURE) ×2
SUT VIC AB 3-0 SH 27X BRD (SUTURE) ×3 IMPLANT
SYR 10ML LL (SYRINGE) ×3 IMPLANT
SYR 20ML LL LF (SYRINGE) ×6 IMPLANT
SYR 3ML LL SCALE MARK (SYRINGE) ×3 IMPLANT
SYR 5ML LL (SYRINGE) ×3 IMPLANT
SYR CONTROL 10ML LL (SYRINGE) ×3 IMPLANT
TOWEL GREEN STERILE (TOWEL DISPOSABLE) ×3 IMPLANT
TOWEL GREEN STERILE FF (TOWEL DISPOSABLE) ×6 IMPLANT
UNDERPAD 30X36 HEAVY ABSORB (UNDERPADS AND DIAPERS) ×3 IMPLANT
WATER STERILE IRR 1000ML POUR (IV SOLUTION) ×3 IMPLANT

## 2023-01-24 NOTE — Transfer of Care (Signed)
Immediate Anesthesia Transfer of Care Note  Patient: Mallory Chavez  Procedure(s) Performed: REVISON OF  LEFT ARM ARTERIOVENOUS FISTULA (Left: Arm Upper) ULTRASOUND GUIDED INSERTION OF DIALYSIS CATHETER (Right: Chest)  Patient Location: PACU  Anesthesia Type:General  Level of Consciousness: awake, alert , and oriented  Airway & Oxygen Therapy: Patient Spontanous Breathing and Patient connected to face mask oxygen  Post-op Assessment: Report given to RN  Post vital signs: Reviewed and stable  Last Vitals:  Vitals Value Taken Time  BP 111/62 01/24/23 0935  Temp    Pulse 73 01/24/23 0940  Resp 8 01/24/23 0940  SpO2 91 % 01/24/23 0940  Vitals shown include unfiled device data.  Last Pain:  Vitals:   01/24/23 0643  PainSc: 0-No pain         Complications: No notable events documented.

## 2023-01-24 NOTE — Interval H&P Note (Signed)
History and Physical Interval Note:  01/24/2023 7:35 AM  Mallory Chavez  has presented today for surgery, with the diagnosis of END STAGE RENAL DISEASE.  The various methods of treatment have been discussed with the patient and family. After consideration of risks, benefits and other options for treatment, the patient has consented to  Procedure(s): REVISON OF  LEFT ARM ARTERIOVENOUS FISTULA (Left) INSERTION OF DIALYSIS CATHETER (N/A) as a surgical intervention.  The patient's history has been reviewed, patient examined, no change in status, stable for surgery.  I have reviewed the patient's chart and labs.  Questions were answered to the patient's satisfaction.     Leonie Douglas

## 2023-01-24 NOTE — Discharge Instructions (Addendum)
[facility]  Discharge Instructions  AV Fistula or Graft Surgery for Dialysis Access  Please refer to the following instructions for your post-procedure care. Your surgeon or physician assistant will discuss any changes with you.  Activity  You may drive the day following your surgery, if you are comfortable and no longer taking prescription pain medication. Resume full activity as the soreness in your incision resolves.  Bathing/Showering  You may shower after you go home. Keep your incision dry for 48 hours. Do not soak in a bathtub, hot tub, or swim until the incision heals completely. You may not shower if you have a hemodialysis catheter.  Incision Care  Clean your incision with mild soap and water after 48 hours. Pat the area dry with a clean towel. You do not need a bandage unless otherwise instructed. Do not apply any ointments or creams to your incision. You may have skin glue on your incision. Do not peel it off. It will come off on its own in about one week. Your arm may swell a bit after surgery. To reduce swelling use pillows to elevate your arm so it is above your heart. Your doctor will tell you if you need to lightly wrap your arm with an ACE bandage.   Okay to remove ace bandage on 01/25/2023 after dialysis.   Diet  Resume your normal diet. There are not special food restrictions following this procedure. In order to heal from your surgery, it is CRITICAL to get adequate nutrition. Your body requires vitamins, minerals, and protein. Vegetables are the best source of vitamins and minerals. Vegetables also provide the perfect balance of protein. Processed food has little nutritional value, so try to avoid this.  Medications  Resume taking all of your medications. If your incision is causing pain, you may take over-the counter pain relievers such as acetaminophen (Tylenol). If you were prescribed a stronger pain medication, please be aware  these medications can cause nausea and constipation. Prevent nausea by taking the medication with a snack or meal. Avoid constipation by drinking plenty of fluids and eating foods with high amount of fiber, such as fruits, vegetables, and grains.  Do not take Tylenol if you are taking prescription pain medications.  Follow up Your surgeon may want to see you in the office following your access surgery. If so, this will be arranged at the time of your surgery.  Please call us immediately for any of the following conditions:  Increased pain, redness, drainage (pus) from your incision site Fever of 101 degrees or higher Severe or worsening pain at your incision site Hand pain or numbness.  Reduce your risk of vascular disease:  Stop smoking. If you would like help, call [facility] at 1-800-QUIT-NOW (330-669-1672) or [place] at 2100137207  Manage your cholesterol Maintain a desired weight Control your diabetes Keep your blood pressure down  Dialysis  It will take several weeks to several months for your new dialysis access to be ready for use. Your surgeon will determine when it is okay to use it. Your nephrologist will continue to direct your dialysis. You can continue to use your Permcath until your new access is ready for use.   01/24/2023 Mallory Chavez [id number] 1936/04/29  Surgeon(s): Leonie Douglas, MD  Procedure(s): REVISON OF  LEFT ARM ARTERIOVENOUS FISTULA INSERTION OF DIALYSIS CATHETER  x Do not stick fistula for 6 weeks    If you have any questions, please call the office at  [phone].  

## 2023-01-24 NOTE — Anesthesia Procedure Notes (Signed)
Procedure Name: Intubation Date/Time: 01/24/2023 7:48 AM  Performed by: Darlina Guys, CRNAPre-anesthesia Checklist: Patient identified, Emergency Drugs available, Suction available and Patient being monitored Patient Re-evaluated:Patient Re-evaluated prior to induction Preoxygenation: Pre-oxygenation with 100% oxygen Induction Type: IV induction Ventilation: Oral airway inserted - appropriate to patient size Laryngoscope Size: Mac and 3 Grade View: Grade I Tube type: Oral Tube size: 7.0 mm Number of attempts: 1 Airway Equipment and Method: Stylet Placement Confirmation: ETT inserted through vocal cords under direct vision, positive ETCO2 and breath sounds checked- equal and bilateral Secured at: 21 cm Tube secured with: Tape

## 2023-01-24 NOTE — Op Note (Signed)
DATE OF SERVICE: 01/24/2023  PATIENT:  Mallory Chavez  87 y.o. 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 My assistant helped with exposure through counter tension, suctioning, ligation and retraction to better visualize the surgical field.  My assistant expedited sewing during the case by following my sutures. Wherever I use the term "we" in the report, my assistant actively helped me with that portion of the procedure.  ANESTHESIA:   general  EBL:  BLOOD ADMINISTERED:none  DRAINS: none   LOCAL MEDICATIONS USED:  NONE  SPECIMEN:  none  COUNTS: confirmed correct .  TOURNIQUET:  none  PATIENT DISPOSITION:  PACU - hemodynamically stable.   Delay start of Pharmacological VTE agent (>24hrs) due to surgical blood loss or risk of bleeding: no  INDICATION FOR PROCEDURE: LAYNIE Chavez is a 87 y.o. female with end-stage renal disease, dialyzing through a left arm arteriovenous fistula.  This is developed aneurysmal degeneration and 2 areas of superficial ulceration. After careful discussion of risks, benefits, and alternatives the patient was offered revision of the fistula and placement of tunneled dialysis catheter. The patient understood and wished to proceed.  OPERATIVE FINDINGS: Successful placement of right internal jugular tunneled dialysis catheter.  Successful revision of left arm arteriovenous fistula aneurysms.  DESCRIPTION OF PROCEDURE: After identification of the patient  in the pre-operative holding area, the patient was transferred to the operating room. The patient was positioned supine on the operating room table. Anesthesia was induced. The neck, chest, and left arm were prepped and draped in standard fashion. A surgical pause was performed confirming correct patient, procedure, and operative location.  Using ultrasound guidance the right internal jugular vein was accessed with micropuncture technique.  Through the micropuncture sheath a floppy J-wire was advanced into the superior vena cava.  A small incision was made around the skin access point.  The access point was serially dilated under direct fluoroscopic guidance.  A peel-away sheath was introduced into the superior vena cava under fluoroscopic guidance.  A counterincision was made in the chest under the clavicle.  A 19 cm tunnel dialysis catheter was then tunneled under the skin, over the clavicle into the incision in the neck.  The tunneling device was removed and the catheter fed through the peel-away sheath into the superior vena cava.  The peel-away sheath was removed and the catheter gently pulled back.  Adequate position was confirmed with x-ray.  The catheter was tested and found to flush and draw back well.  Catheter was heparin locked.  Caps were applied.  Catheter was sutured to the skin.  The neck incision was closed with 4-0 Monocryl.  An ellipse incision was made around the aneurysmal fistula to exclude the aneurysms.  The incision was carried down through subcutaneous tissue with Bovie electrocautery.  The fistula was carefully skeletonized from healthy segment near the anastomosis to healthy segment near the anterior deltoid.  Clamps were applied to the proximal and distal fistula.  The anterior surface of the fistula and affected skin was sharply excised with [facility]  scissor.  2 aneurysms were identified and partially debrided down to allow more normal diameter.  Both fistulas were closed in 2 layer  closure with Blalock technique using 5-0 Prolene suture.  Upon completion the repair was flushed and de-aired.  Clamp was released on the outflow, followed by the inflow.  Hemostasis was achieved in the repairs in the surgical bed.  The wound was closed in layers using 3-0 Vicryl and 4-0 Monocryl.  Upon completion of the case instrument and sharps counts were confirmed correct. The patient was transferred to the  PACU in good condition. I was present for all portions of the procedure.  FOLLOW UP PLAN: Assuming a normal postoperative course, VVS PA will see the patient in 2-3 weeks for wound check.   Rande Brunt. Lenell Antu, MD [facility] Phone Number: [phone] 01/24/2023 9:33 AM

## 2023-01-24 NOTE — Anesthesia Postprocedure Evaluation (Signed)
Anesthesia Post Note  Patient: CANDIACE WEST  Procedure(s) Performed: REVISON OF  LEFT ARM ARTERIOVENOUS FISTULA (Left: Arm Upper) ULTRASOUND GUIDED INSERTION OF DIALYSIS CATHETER (Right: Chest)     Patient location during evaluation: PACU Anesthesia Type: General Level of consciousness: awake and alert Pain management: pain level controlled Vital Signs Assessment: post-procedure vital signs reviewed and stable Respiratory status: spontaneous breathing, nonlabored ventilation and respiratory function stable Cardiovascular status: blood pressure returned to baseline and stable Postop Assessment: no apparent nausea or vomiting Anesthetic complications: no  No notable events documented.  Last Vitals:  Vitals:   01/24/23 1100 01/24/23 1115  BP: (!) 133/58 (!) 120/55  Pulse: 78 79  Resp: 10 12  Temp:    SpO2: 100% 100%    Last Pain:  Vitals:   01/24/23 1030  PainSc: Asleep                 Verley Pariseau,W. EDMOND

## 2023-01-25 ENCOUNTER — Encounter (HOSPITAL_COMMUNITY): Payer: Self-pay | Admitting: Vascular Surgery

## 2023-01-25 ENCOUNTER — Telehealth: Payer: Self-pay

## 2023-01-28 NOTE — Telephone Encounter (Addendum)
Returned call to pt's daughter Mallory Chavez - c/o pain while taking dialysis and some numbness in L thumb that subsided after treatment. S/w pt and she stated that she did not report to nurse/staff at dialysis. Advised pt to report any concerns that occur at dialysis to the staff at the center. Also advised that if she continues to have numbness and or pain to give our office a call or if unable to reach our office then go to the nearest ED. Pt verbalized understanding and agreed with this plan.

## 2023-02-04 ENCOUNTER — Other Ambulatory Visit (HOSPITAL_COMMUNITY): Payer: Self-pay

## 2023-02-04 ENCOUNTER — Other Ambulatory Visit: Payer: Self-pay

## 2023-02-04 MED ORDER — DIALYVITE 800/ZINC 0.8 MG PO TABS
1.0000 | ORAL_TABLET | Freq: Every day | ORAL | 3 refills | Status: AC
Start: 2023-02-04 — End: ?
  Filled 2023-02-04: qty 100, 100d supply, fill #0

## 2023-02-04 MED ORDER — LIDOCAINE-PRILOCAINE 2.5-2.5 % EX CREA
TOPICAL_CREAM | CUTANEOUS | 11 refills | Status: DC
  Filled 2023-02-04: qty 30, 30d supply, fill #0

## 2023-02-05 ENCOUNTER — Other Ambulatory Visit: Payer: Self-pay

## 2023-02-05 ENCOUNTER — Other Ambulatory Visit (HOSPITAL_COMMUNITY): Payer: Self-pay

## 2023-02-09 ENCOUNTER — Other Ambulatory Visit (HOSPITAL_COMMUNITY): Payer: Self-pay

## 2023-02-11 ENCOUNTER — Other Ambulatory Visit (HOSPITAL_COMMUNITY): Payer: Self-pay

## 2023-02-12 ENCOUNTER — Ambulatory Visit (INDEPENDENT_AMBULATORY_CARE_PROVIDER_SITE_OTHER): Payer: 59 | Admitting: Physician Assistant

## 2023-02-12 VITALS — BP 129/57 | HR 100 | Temp 97.8°F | Resp 18 | Ht 64.0 in | Wt 149.3 lb

## 2023-02-12 DIAGNOSIS — N186 End stage renal disease: Secondary | ICD-10-CM

## 2023-02-12 DIAGNOSIS — Z992 Dependence on renal dialysis: Secondary | ICD-10-CM

## 2023-02-12 NOTE — Progress Notes (Signed)
  POST OPERATIVE OFFICE NOTE    CC:  F/u for surgery  HPI:  This is a 87 y.o. 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 Lenell Antu.    Pt returns today for follow up.  Pt states she is doing well over all.  There was an opening in the incision centrally , but it has now scabbed over.  She denies fever and chills.  Her right internal jugular TDC is working well.  She has HD MWF on [address].    No Known Allergies  Current Outpatient Medications  Medication Sig Dispense Refill   albuterol (VENTOLIN HFA) 108 (90 Base) MCG/ACT inhaler Inhale 2 puffs by mouth into the lungs every 4 hours as needed for wheezing or shortness of breath 6.7 g 3   aspirin EC 81 MG tablet Take 81 mg by mouth daily after supper.     B Complex-C-Zn-Folic Acid (DIALYVITE 800 WITH ZINC) 0.8 MG TABS Take 1 tablet by mouth at bedtime 100 tablet 3   budesonide-formoterol (SYMBICORT) 160-4.5 MCG/ACT inhaler Inhale 2 puffs into the lungs 2 (two) times daily. 10.2 g 5   Cholecalciferol (VITAMIN D3 PO) Take 1 capsule by mouth every Monday, Wednesday, and Friday.     colchicine 0.6 MG tablet Take 0.6 mg by mouth daily as needed (Gout).     Glycerin-Hypromellose-PEG 400 (DRY EYE RELIEF DROPS) 0.2-0.2-1 % SOLN Place 1 drop into both eyes daily as needed (Dry eyes).     lidocaine-prilocaine (EMLA) cream Apply  a small amount to skin as directed to AVF- prior to dialysis 30 g 11   lidocaine-prilocaine (EMLA) cream Apply a small amount to AVF- prior to dialysis as directed. 30 g 11   loratadine (CLARITIN) 10 MG tablet Take 10 mg by mouth daily after supper.     OXYGEN Inhale 1 L into the lungs daily as needed (with sleep and exertion as needed [facility]).     B Complex-C-Zn-Folic Acid (DIALYVITE 800 WITH ZINC) 0.8 MG TABS Take 1 tablet by mouth every evening.     oxyCODONE-acetaminophen (PERCOCET) 5-325 MG tablet Take 1 tablet by mouth every 6 (six) hours as  needed. (Patient not taking: Reported on 02/12/2023) 15 tablet 0   oxyCODONE-acetaminophen (PERCOCET) 5-325 MG tablet Take 1 tablet by mouth every 6 (six) hours as needed for severe pain. (Patient not taking: Reported on 02/12/2023) 4 tablet 0   No current facility-administered medications for this visit.     ROS:  See HPI  Physical Exam:    Incision:    Extremities:  N/V/M intact     Assessment/Plan:  This is a 87 y.o. 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 Lenell Antu.   Her incision is healing.  She has no symptoms of steal.  The right internal jugular TDC is working well.  I would like to see the incision fully healed before the fistula is accessed.  The fistula may be used as of 03/15/23 as long as the incision has fully healed.  Once the fistula is working well she can be scheduled for [facility] removal.  She is a little worried about the [facility] removal because she had a bad experience last time.     Mosetta Pigeon PA-C [facility] [phone]   Clinic MD:  Chestine Spore

## 2023-02-13 ENCOUNTER — Other Ambulatory Visit (HOSPITAL_COMMUNITY): Payer: Self-pay

## 2023-02-13 ENCOUNTER — Other Ambulatory Visit: Payer: Self-pay

## 2023-02-13 MED ORDER — MIDODRINE HCL 10 MG PO TABS
ORAL_TABLET | ORAL | 11 refills | Status: AC
  Filled 2023-02-13: qty 30, 30d supply, fill #0

## 2023-02-20 ENCOUNTER — Other Ambulatory Visit (HOSPITAL_COMMUNITY): Payer: Self-pay

## 2023-03-14 ENCOUNTER — Other Ambulatory Visit (HOSPITAL_COMMUNITY): Payer: Self-pay

## 2023-03-14 ENCOUNTER — Telehealth: Payer: Self-pay | Admitting: Internal Medicine

## 2023-03-14 MED ORDER — ALBUTEROL SULFATE HFA 108 (90 BASE) MCG/ACT IN AERS
2.0000 | INHALATION_SPRAY | RESPIRATORY_TRACT | 6 refills | Status: DC | PRN
  Filled 2023-08-01: qty 6.7, 17d supply, fill #0
  Filled 2023-09-05: qty 6.7, 17d supply, fill #1
  Filled 2023-10-08: qty 6.7, 17d supply, fill #2

## 2023-03-14 NOTE — Telephone Encounter (Signed)
Spoke to pt and informed her that I sent albuterol (VENTOLIN HFA) 108 (90 Base) MCG/ACT inhaler to her mail order pharmacy. Pt verbalized understanding. Nothing further needed.

## 2023-03-14 NOTE — Telephone Encounter (Signed)
Patient requesting refill of albuterol rescue inhaler sent to mail in pharmacy [facility] . Please advise

## 2023-03-21 ENCOUNTER — Ambulatory Visit (INDEPENDENT_AMBULATORY_CARE_PROVIDER_SITE_OTHER): Payer: 59 | Admitting: Podiatry

## 2023-03-21 ENCOUNTER — Telehealth (HOSPITAL_COMMUNITY): Payer: Self-pay | Admitting: *Deleted

## 2023-03-21 ENCOUNTER — Encounter: Payer: Self-pay | Admitting: Podiatry

## 2023-03-21 VITALS — BP 142/62 | HR 102

## 2023-03-21 DIAGNOSIS — M7752 Other enthesopathy of left foot: Secondary | ICD-10-CM | POA: Diagnosis not present

## 2023-03-21 MED ORDER — TRIAMCINOLONE ACETONIDE 10 MG/ML IJ SUSP
10.0000 mg | Freq: Once | INTRAMUSCULAR | Status: AC
Start: 2023-03-21 — End: 2023-03-21
  Administered 2023-03-21: 10 mg via INTRA_ARTICULAR

## 2023-03-21 NOTE — Progress Notes (Signed)
Subjective:   Patient ID: Mallory Chavez, female   DOB: 87 y.o.   MRN: [id number]   HPI Patient states she is developed pain around the big toe joint left again and it has been sore   ROS      Objective:  Physical Exam  Neuro vascular status intact inflammation around the first MPJ left fluid buildup around the joint surface with lesion     Assessment:  Inflammatory capsulitis around the first MPJ medial side painful     Plan:  Reviewed condition sterile prep injected the joint surface 3 mg Dexasone Kenalog 5 mg Xylocaine debrided a lesion courtesy patient is a diabetic do not recommend any type of surgical procedure currently

## 2023-03-21 NOTE — Telephone Encounter (Signed)
Received fax from Dr Sabra Heck requesting [facility] removal. Will give to [facility].

## 2023-03-27 NOTE — Telephone Encounter (Signed)
Left message for the Infusion center at [facility] to request a date for [facility] removal.

## 2023-04-05 ENCOUNTER — Telehealth (HOSPITAL_COMMUNITY): Payer: Self-pay | Admitting: *Deleted

## 2023-04-05 NOTE — Telephone Encounter (Signed)
Received fax from Dr Sabra Heck requesting [facility] removal. Will give to [facility].

## 2023-04-08 ENCOUNTER — Other Ambulatory Visit: Payer: Self-pay

## 2023-04-08 NOTE — Telephone Encounter (Signed)
Spoke with Nytelia at [facility] and patient scheduled for next available date of 10/15 at 1pm.   Spoke with patient's daughter Eber Jones who agreed to procedure date. Instructions provided and she voiced understanding.

## 2023-04-16 ENCOUNTER — Ambulatory Visit (HOSPITAL_COMMUNITY)
Admission: RE | Admit: 2023-04-16 | Discharge: 2023-04-16 | Disposition: A | Discharge status: Home / Self Care | Payer: 59 | Source: Ambulatory Visit | Attending: Vascular Surgery | Admitting: Vascular Surgery

## 2023-04-16 DIAGNOSIS — N186 End stage renal disease: Secondary | ICD-10-CM | POA: Insufficient documentation

## 2023-04-16 DIAGNOSIS — Z992 Dependence on renal dialysis: Secondary | ICD-10-CM | POA: Diagnosis not present

## 2023-04-16 MED ORDER — LIDOCAINE HCL (PF) 2 % IJ SOLN
INTRAMUSCULAR | Status: AC
  Filled 2023-04-16: qty 10

## 2023-04-16 NOTE — Progress Notes (Signed)
[facility] Catheter Removal Procedure Note  Diagnosis: ESRD with Functioning AVF/AVGG  Plan:  Remove right diatek catheter placed 01/24/23 by Lenell Antu  Consent signed:  Yes.   Time out completed:  yes Coumadin:  No. PT/INR (if applicable):   Other labs:   Procedure: 1.  Sterile prepping and draping over catheter area 2. 6 ml 2% lidocaine plain instilled at removal site. 3.  right catheter removed in its entirety with cuff in tact. 4.  Complications: none  5. Tip of catheter sent for culture:  no   Patient tolerated procedure well:  yes Pressure held, no bleeding noted, dressing applied Instructions given to the pt regarding wound care and bleeding.  Other:  Mosetta Pigeon 04/16/2023 1:01 PM

## 2023-04-22 ENCOUNTER — Other Ambulatory Visit (HOSPITAL_COMMUNITY): Payer: Self-pay

## 2023-04-22 MED ORDER — LIDOCAINE-PRILOCAINE 2.5-2.5 % EX CREA
1.0000 | TOPICAL_CREAM | CUTANEOUS | 11 refills | Status: AC
  Filled 2023-04-22: qty 30, 30d supply, fill #0
  Filled 2023-07-15 – 2023-08-12 (×2): qty 30, 30d supply, fill #1
  Filled 2023-12-12: qty 30, 30d supply, fill #2
  Filled 2024-01-14: qty 30, 30d supply, fill #3

## 2023-04-25 ENCOUNTER — Other Ambulatory Visit (HOSPITAL_COMMUNITY): Payer: Self-pay

## 2023-07-15 ENCOUNTER — Other Ambulatory Visit (HOSPITAL_COMMUNITY): Payer: Self-pay

## 2023-07-15 ENCOUNTER — Telehealth: Payer: Self-pay | Admitting: Internal Medicine

## 2023-07-15 ENCOUNTER — Other Ambulatory Visit: Payer: Self-pay | Admitting: Internal Medicine

## 2023-07-15 MED ORDER — BUDESONIDE-FORMOTEROL FUMARATE 160-4.5 MCG/ACT IN AERO
2.0000 | INHALATION_SPRAY | Freq: Two times a day (BID) | RESPIRATORY_TRACT | 5 refills | Status: DC
  Filled 2023-07-15: qty 10.2, 30d supply, fill #0

## 2023-07-15 NOTE — Telephone Encounter (Signed)
 Patient states needs refill for Symbicort. Pharmacy is [facility]. Patient phone number is [phone].

## 2023-07-16 ENCOUNTER — Other Ambulatory Visit: Payer: Self-pay

## 2023-07-18 ENCOUNTER — Other Ambulatory Visit (HOSPITAL_COMMUNITY): Payer: Self-pay

## 2023-07-18 NOTE — Telephone Encounter (Signed)
Pt request mail order pharmacy but refill sent to [facility].Per [facility] pharmacy medication has been shipped to patient. Closing encounter.

## 2023-07-22 ENCOUNTER — Telehealth: Payer: Self-pay | Admitting: Internal Medicine

## 2023-07-22 NOTE — Telephone Encounter (Signed)
Pham calling. Her Symbicort RX has expired and they need a new one fax'd to [phone]

## 2023-07-30 ENCOUNTER — Other Ambulatory Visit (HOSPITAL_COMMUNITY): Payer: Self-pay

## 2023-07-30 MED ORDER — BUDESONIDE-FORMOTEROL FUMARATE 160-4.5 MCG/ACT IN AERO: 2.0000 | INHALATION_SPRAY | Freq: Two times a day (BID) | RESPIRATORY_TRACT | 5 refills | Status: DC

## 2023-07-30 NOTE — Telephone Encounter (Signed)
Symbicort was sent to [facility]

## 2023-07-31 ENCOUNTER — Other Ambulatory Visit (HOSPITAL_COMMUNITY): Payer: Self-pay

## 2023-08-01 ENCOUNTER — Other Ambulatory Visit: Payer: Self-pay

## 2023-08-01 ENCOUNTER — Other Ambulatory Visit (HOSPITAL_COMMUNITY): Payer: Self-pay

## 2023-08-01 MED ORDER — MIDODRINE HCL 10 MG PO TABS
10.0000 mg | ORAL_TABLET | ORAL | 10 refills | Status: DC
  Filled 2023-08-12 – 2023-11-09 (×3): qty 30, 70d supply, fill #0

## 2023-08-01 MED ORDER — CALCIUM CARBONATE ANTACID 500 MG PO CHEW
1500.0000 mg | CHEWABLE_TABLET | Freq: Every day | ORAL | 6 refills | Status: AC
Start: 2022-09-21 — End: ?
  Filled 2023-08-01: qty 90, 30d supply, fill #0

## 2023-08-02 ENCOUNTER — Other Ambulatory Visit (HOSPITAL_COMMUNITY): Payer: Self-pay

## 2023-08-12 ENCOUNTER — Other Ambulatory Visit (HOSPITAL_COMMUNITY): Payer: Self-pay

## 2023-08-12 ENCOUNTER — Other Ambulatory Visit: Payer: Self-pay

## 2023-08-13 ENCOUNTER — Other Ambulatory Visit: Payer: Self-pay

## 2023-08-13 ENCOUNTER — Other Ambulatory Visit (HOSPITAL_COMMUNITY): Payer: Self-pay

## 2023-08-13 ENCOUNTER — Telehealth: Payer: Self-pay | Admitting: Internal Medicine

## 2023-08-13 MED ORDER — BUDESONIDE-FORMOTEROL FUMARATE 160-4.5 MCG/ACT IN AERO
2.0000 | INHALATION_SPRAY | Freq: Two times a day (BID) | RESPIRATORY_TRACT | 5 refills | Status: DC
  Filled 2023-08-13 – 2023-09-11 (×2): qty 10.2, 30d supply, fill #0
  Filled 2023-10-08: qty 10.2, 30d supply, fill #1
  Filled 2023-11-09: qty 10.2, 30d supply, fill #2
  Filled 2023-12-12: qty 10.2, 30d supply, fill #3
  Filled 2024-01-14: qty 10.2, 30d supply, fill #4
  Filled 2024-02-17: qty 10.2, 30d supply, fill #5

## 2023-08-13 NOTE — Telephone Encounter (Signed)
Patient states needs refill for Symbicort. Pharmacy is [facility]. Patient phone number is [phone] and [phone].

## 2023-08-13 NOTE — Telephone Encounter (Signed)
Symbicort has been sent to preferred pharmacy.  Pt is aware and voiced her understanding.  Nothing further needed.

## 2023-08-14 ENCOUNTER — Other Ambulatory Visit (HOSPITAL_COMMUNITY): Payer: Self-pay

## 2023-08-17 ENCOUNTER — Other Ambulatory Visit (HOSPITAL_COMMUNITY): Payer: Self-pay

## 2023-08-19 ENCOUNTER — Other Ambulatory Visit: Payer: Self-pay

## 2023-08-19 ENCOUNTER — Other Ambulatory Visit (HOSPITAL_COMMUNITY): Payer: Self-pay

## 2023-08-20 ENCOUNTER — Other Ambulatory Visit (HOSPITAL_COMMUNITY): Payer: Self-pay

## 2023-09-03 ENCOUNTER — Ambulatory Visit (HOSPITAL_COMMUNITY)
Admission: RE | Admit: 2023-09-03 | Discharge: 2023-09-03 | Disposition: A | Discharge status: Home / Self Care | Payer: 59 | Attending: Nephrology | Admitting: Nephrology

## 2023-09-03 ENCOUNTER — Encounter (HOSPITAL_COMMUNITY)
Admission: RE | Disposition: A | Discharge status: Home / Self Care | Payer: Self-pay | Source: Home / Self Care | Attending: Nephrology

## 2023-09-03 ENCOUNTER — Encounter (HOSPITAL_COMMUNITY): Payer: Self-pay | Admitting: Nephrology

## 2023-09-03 ENCOUNTER — Other Ambulatory Visit: Payer: Self-pay

## 2023-09-03 DIAGNOSIS — J4489 Other specified chronic obstructive pulmonary disease: Secondary | ICD-10-CM | POA: Insufficient documentation

## 2023-09-03 DIAGNOSIS — Y832 Surgical operation with anastomosis, bypass or graft as the cause of abnormal reaction of the patient, or of later complication, without mention of misadventure at the time of the procedure: Secondary | ICD-10-CM | POA: Insufficient documentation

## 2023-09-03 DIAGNOSIS — Z87891 Personal history of nicotine dependence: Secondary | ICD-10-CM | POA: Diagnosis not present

## 2023-09-03 DIAGNOSIS — N250 Renal osteodystrophy: Secondary | ICD-10-CM | POA: Insufficient documentation

## 2023-09-03 DIAGNOSIS — D631 Anemia in chronic kidney disease: Secondary | ICD-10-CM | POA: Insufficient documentation

## 2023-09-03 DIAGNOSIS — T82858A Stenosis of vascular prosthetic devices, implants and grafts, initial encounter: Secondary | ICD-10-CM | POA: Insufficient documentation

## 2023-09-03 DIAGNOSIS — I132 Hypertensive heart and chronic kidney disease with heart failure and with stage 5 chronic kidney disease, or end stage renal disease: Secondary | ICD-10-CM | POA: Insufficient documentation

## 2023-09-03 DIAGNOSIS — Z992 Dependence on renal dialysis: Secondary | ICD-10-CM | POA: Insufficient documentation

## 2023-09-03 DIAGNOSIS — I509 Heart failure, unspecified: Secondary | ICD-10-CM | POA: Diagnosis not present

## 2023-09-03 DIAGNOSIS — N186 End stage renal disease: Secondary | ICD-10-CM | POA: Insufficient documentation

## 2023-09-03 DIAGNOSIS — E785 Hyperlipidemia, unspecified: Secondary | ICD-10-CM | POA: Diagnosis not present

## 2023-09-03 HISTORY — PX: PERIPHERAL VASCULAR BALLOON ANGIOPLASTY: CATH118281

## 2023-09-03 HISTORY — PX: A/V FISTULAGRAM: CATH118298

## 2023-09-03 SURGERY — A/V FISTULAGRAM
Anesthesia: LOCAL

## 2023-09-03 MED ORDER — LIDOCAINE HCL (PF) 1 % IJ SOLN
INTRAMUSCULAR | Status: DC | PRN
  Administered 2023-09-03: 2 mL via INTRADERMAL

## 2023-09-03 MED ORDER — IODIXANOL 320 MG/ML IV SOLN
INTRAVENOUS | Status: DC | PRN
  Administered 2023-09-03: 8 mL via INTRAVENOUS

## 2023-09-03 MED ORDER — MIDAZOLAM HCL 2 MG/2ML IJ SOLN
INTRAMUSCULAR | Status: AC
  Filled 2023-09-03: qty 2

## 2023-09-03 MED ORDER — LIDOCAINE HCL (PF) 1 % IJ SOLN
INTRAMUSCULAR | Status: AC
  Filled 2023-09-03: qty 30

## 2023-09-03 MED ORDER — FENTANYL CITRATE (PF) 100 MCG/2ML IJ SOLN
INTRAMUSCULAR | Status: AC
Start: 2023-09-03 — End: ?
  Filled 2023-09-03: qty 2

## 2023-09-03 MED ORDER — FENTANYL CITRATE (PF) 100 MCG/2ML IJ SOLN
INTRAMUSCULAR | Status: DC | PRN
  Administered 2023-09-03: 25 ug via INTRAVENOUS

## 2023-09-03 MED ORDER — HEPARIN (PORCINE) IN NACL 1000-0.9 UT/500ML-% IV SOLN
INTRAVENOUS | Status: DC | PRN
  Administered 2023-09-03: 500 mL

## 2023-09-03 MED ORDER — MIDAZOLAM HCL 2 MG/2ML IJ SOLN
INTRAMUSCULAR | Status: DC | PRN
  Administered 2023-09-03: 1 mg via INTRAVENOUS

## 2023-09-03 SURGICAL SUPPLY — 9 items
BAG SNAP BAND KOVER 36X36 (MISCELLANEOUS) ×3 IMPLANT
BALLN ATHLETIS 8X40X75 (BALLOONS) ×2 IMPLANT
BALLOON ATHLETIS 8X40X75 (BALLOONS) IMPLANT
COVER DOME SNAP 22 D (MISCELLANEOUS) ×3 IMPLANT
GUIDEWIRE ANGLED .035 180CM (WIRE) IMPLANT
SHEATH PINNACLE R/O II 6F 4CM (SHEATH) IMPLANT
SYR MEDALLION 10ML (SYRINGE) IMPLANT
SYR MEDALLION 3ML (SYRINGE) IMPLANT
TRAY PV CATH (CUSTOM PROCEDURE TRAY) ×3 IMPLANT

## 2023-09-03 NOTE — Discharge Instructions (Signed)

## 2023-09-03 NOTE — H&P (Signed)
 Chief Complaint: Decreased flows  Assessment/Plan: ESRD dialyzing at [facility] MWF regimen with last dialysis Monday Decreased access flows - planning on angiogram with possibly angioplasty.  Patient had a aneurysm plication revision of 2 aneurysms by Dr. Lenell Antu January 24, 2023.  Lt BCF  was originally placed July 23, 2018 by Dr. Randie Heinz. Renal osteodystrophy - continue binders per home regimen. Anemia - managed with ESA's and IV iron at dialysis center. HTN - resume home regimen. Being referred for decreasing access flows. HPI: Mallory Chavez is an 88 y.o. female with a history of congestive heart failure, COPD, ESRD dialyzed Monday Wednesday Fridays at [facility], hypertension, hyperlipidemia.   ROS Per HPI.  Chemistry and CBC: Creatinine, Ser  Date/Time Value Ref Range Status  01/24/2023 06:32 AM 5.50 (H) 0.44 - 1.00 mg/dL Final  16/04/9603 54:09 AM 6.09 (H) 0.44 - 1.00 mg/dL Final    Comment:    DIALYSIS  02/15/2022 01:10 AM 3.97 (H) 0.44 - 1.00 mg/dL Final  81/19/1478 29:56 AM 7.10 (H) 0.44 - 1.00 mg/dL Final  21/30/8657 84:69 PM 5.91 (H) 0.44 - 1.00 mg/dL Final  62/95/2841 32:44 AM 8.43 (H) 0.44 - 1.00 mg/dL Final  07/04/7251 66:44 PM 7.28 (H) 0.44 - 1.00 mg/dL Final  03/47/4259 56:38 AM 6.47 (H) 0.44 - 1.00 mg/dL Final  75/64/3329 51:88 AM 5.18 (H) 0.44 - 1.00 mg/dL Final  41/66/0630 16:01 AM 5.30 (H) 0.44 - 1.00 mg/dL Final  09/32/3557 32:20 AM 7.27 (H) 0.44 - 1.00 mg/dL Final  25/42/7062 37:62 PM 7.10 (H) 0.44 - 1.00 mg/dL Final  83/15/1761 60:73 PM 6.71 (H) 0.44 - 1.00 mg/dL Final  71/11/2692 85:46 AM 8.38 (H) 0.44 - 1.00 mg/dL Final  27/08/5007 38:18 AM 7.00 (H) 0.44 - 1.00 mg/dL Final    Comment:    DELTA CHECK NOTED DIALYSIS   01/29/2022 06:29 AM 11.11 (H) 0.44 - 1.00 mg/dL Final  29/93/7169 67:89 PM 6.45 (H) 0.44 - 1.00 mg/dL Final  38/04/1750 02:58 AM 7.90 (H) 0.44 - 1.00 mg/dL Final  52/77/8242 35:36 PM 6.72 (H) 0.44 - 1.00 mg/dL Final  14/43/1540 08:67 AM 6.60 (H)  0.44 - 1.00 mg/dL Final  61/95/0932 67:12 AM 6.20 (H) 0.44 - 1.00 mg/dL Final  45/80/9983 38:25 AM 5.15 (H) 0.44 - 1.00 mg/dL Final  05/39/7673 41:93 AM 4.73 (H) 0.44 - 1.00 mg/dL Final  79/08/4095 35:32 AM 3.39 (H) 0.44 - 1.00 mg/dL Final  99/24/2683 41:96 AM 3.86 (H) 0.44 - 1.00 mg/dL Final  22/29/7989 21:19 AM 4.11 (H) 0.44 - 1.00 mg/dL Final  41/74/0814 48:18 AM 5.70 (H) 0.44 - 1.00 mg/dL Final  56/31/4970 26:37 AM 5.61 (H) 0.44 - 1.00 mg/dL Final  85/88/5027 74:12 AM 5.49 (H) 0.44 - 1.00 mg/dL Final  87/86/7672 09:47 AM 5.30 (H) 0.44 - 1.00 mg/dL Final  09/62/8366 29:47 PM 5.21 (H) 0.44 - 1.00 mg/dL Final  65/46/5035 46:56 AM 3.45 (H) 0.44 - 1.00 mg/dL Final  81/27/5170 01:74 AM 3.90 (H) 0.44 - 1.00 mg/dL Final  94/49/6759 16:38 AM 4.16 (H) 0.44 - 1.00 mg/dL Final  46/65/9935 70:17 AM 3.03 (H) 0.44 - 1.00 mg/dL Final  79/39/0300 92:33 AM 3.14 (H) 0.44 - 1.00 mg/dL Final  00/76/2263 33:54 AM 3.34 (H) 0.44 - 1.00 mg/dL Final  56/25/6389 37:34 AM 3.86 (H) 0.44 - 1.00 mg/dL Final  28/76/8115 72:62 PM 4.02 (H) 0.44 - 1.00 mg/dL Final  03/55/9741 63:84 AM 1.8 (H) 0.4 - 1.2 mg/dL Final   No results for input(s): "NA", "K", "CL", "CO2", "  GLUCOSE", "BUN", "CREATININE", "CALCIUM", "PHOS" in the last 168 hours.  Invalid input(s): "ALB" No results for input(s): "WBC", "NEUTROABS", "HGB", "HCT", "MCV", "PLT" in the last 168 hours. Liver Function Tests: No results for input(s): "AST", "ALT", "ALKPHOS", "BILITOT", "PROT", "ALBUMIN" in the last 168 hours. No results for input(s): "LIPASE", "AMYLASE" in the last 168 hours. No results for input(s): "AMMONIA" in the last 168 hours. Cardiac Enzymes: No results for input(s): "CKTOTAL", "CKMB", "CKMBINDEX", "TROPONINI" in the last 168 hours. Iron Studies: No results for input(s): "IRON", "TIBC", "TRANSFERRIN", "FERRITIN" in the last 72 hours. PT/INR: @LABRCNTIP (inr:5)  Xrays/Other Studies: )No results found for this or any previous visit (from  the past 48 hours). No results found.  PMH:   Past Medical History:  Diagnosis Date   Allergy    Arthritis    Asthma    CHF (congestive heart failure) (HCC) 1980   controlled with meds   Chronic respiratory failure (HCC)    COPD (chronic obstructive pulmonary disease) (HCC)    ESRD on hemodialysis (HCC)    MWF at [facility]   GERD (gastroesophageal reflux disease)    Hyperglycemia    Hyperlipidemia    Hypertension    Hyponatremia     PSH:   Past Surgical History:  Procedure Laterality Date   AV FISTULA PLACEMENT Left 07/23/2018   Procedure: ARTERIOVENOUS (AV) FISTULA CREATION;  Surgeon: Maeola Harman, MD;  Location: [facility] OR;  Service: Vascular;  Laterality: Left;   ECTOPIC PREGNANCY SURGERY  1962   INSERTION OF DIALYSIS CATHETER Right 07/23/2018   Procedure: INSERTION OF DIALYSIS CATHETER;  Surgeon: Maeola Harman, MD;  Location: [facility] OR;  Service: Vascular;  Laterality: Right;   INSERTION OF DIALYSIS CATHETER Right 01/24/2023   Procedure: ULTRASOUND GUIDED INSERTION OF DIALYSIS CATHETER;  Surgeon: Leonie Douglas, MD;  Location: [facility] OR;  Service: Vascular;  Laterality: Right;   PARATHYROID EXPLORATION N/A 01/25/2022   Procedure: NECK EXPLORATION WITH PARATHYROIDECTOMY;  Surgeon: Darnell Level, MD;  Location: [facility] OR;  Service: General;  Laterality: N/A;   REVISON OF ARTERIOVENOUS FISTULA Left 01/24/2023   Procedure: REVISON OF  LEFT ARM ARTERIOVENOUS FISTULA;  Surgeon: Leonie Douglas, MD;  Location: [facility] OR;  Service: Vascular;  Laterality: Left;   TUMOR REMOVAL     lower back/benign and has come back   VAGINAL HYSTERECTOMY      Allergies: No Known Allergies  Medications:   Prior to Admission medications   Medication Sig Start Date End Date Taking? Authorizing Provider  acetaminophen (TYLENOL) 500 MG tablet Take 1,000 mg by mouth every 6 (six) hours as needed for mild pain (pain score 1-3) or moderate pain (pain score 4-6).   Yes [provider]  albuterol  (VENTOLIN HFA) 108 (90 Base) MCG/ACT inhaler Inhale 2 puffs by mouth into the lungs every 4 (four) hours as needed for wheezing or shortness of breath 03/14/23  Yes Nyoka Cowden, MD  aspirin EC 81 MG tablet Take 81 mg by mouth daily after supper.   Yes [provider]  B Complex-C-Zn-Folic Acid (DIALYVITE 800 WITH ZINC) 0.8 MG TABS Take 1 tablet by mouth at bedtime 02/04/23  Yes   budesonide-formoterol (SYMBICORT) 160-4.5 MCG/ACT inhaler Inhale 2 puffs into the lungs 2 (two) times daily. 08/13/23  Yes Nyoka Cowden, MD  Cholecalciferol (VITAMIN D3 PO) Take 1 capsule by mouth every Monday, Wednesday, and Friday.   Yes [provider]  lidocaine-prilocaine (EMLA) cream Apply small amount to skin /to AVF- prior to  dialysis 04/22/23  Yes   loratadine (CLARITIN) 10 MG tablet Take 10 mg by mouth daily after supper.   Yes [provider]  OXYGEN Inhale 2 L into the lungs daily as needed (with sleep and exertion as needed [facility]).   Yes [provider]  calcium carbonate (CAL-GEST ANTACID) 500 MG chewable tablet Chew and swallow 3 tablets (600 mg of elemental calcium total) by mouth daily. (DO NOT TAKE WITH MEALS) Patient not taking: Reported on 08/29/2023 09/21/22     colchicine 0.6 MG tablet Take 0.6 mg by mouth daily as needed (Gout).    [provider]  Glycerin-Hypromellose-PEG 400 (DRY EYE RELIEF DROPS) 0.2-0.2-1 % SOLN Place 1 drop into both eyes daily as needed (Dry eyes).    [provider]  midodrine (PROAMATINE) 10 MG tablet Take 1 tablet by mouth as directed. Take one tablet 30 mins before each hemodialysis every Mon, Wed, Fri. May take a 2nd dose while on dialysis for SBP<100 Patient not taking: Reported on 08/29/2023 02/13/23     midodrine (PROAMATINE) 10 MG tablet Take 1 tablet (10 mg total) by mouth every Monday, Wednesday, and Friday 30 minutes before dialysis as directed. May take 2nd dose while on dialysis for SBP<100 Patient not taking:  Reported on 08/29/2023 02/13/23     calcium carbonate (OS-CAL - DOSED IN MG OF ELEMENTAL CALCIUM) 1250 (500 Ca) MG tablet Take 4 tablets (5,000 mg total) by mouth 3 (three) times daily between meals. 02/11/22 02/16/22  Alwyn Ren, MD    Discontinued Meds:   Medications Discontinued During This Encounter  Medication Reason   B Complex-C-Zn-Folic Acid (DIALYVITE 800 WITH ZINC) 0.8 MG TABS Patient Preference   lidocaine-prilocaine (EMLA) cream Duplicate   oxyCODONE-acetaminophen (PERCOCET) 5-325 MG tablet Completed Course   oxyCODONE-acetaminophen (PERCOCET) 5-325 MG tablet Completed Course    Social History:  reports that she quit smoking about 31 years ago. Her smoking use included cigarettes. She started smoking about 51 years ago. She has a 30 pack-year smoking history. She has never used smokeless tobacco. She reports that she does not drink alcohol and does not use drugs.  Family History:   Family History  Problem Relation Age of Onset   Diabetes Mother    Emphysema Father     There were no vitals taken for this visit. GEN: NAD, A&Ox3, NCAT HEENT: No conjunctival pallor, EOMI NECK: Supple, no thyromegaly LUNGS: CTA B/L no rales, rhonchi or wheezing CV: RRR, No M/R/G ABD: SNDNT +BS  EXT: No lower extremity edema ACCESS: Upper arm brachiocephalic fistula with a large scar from the aneurysm repair, pulsatile towards the outflow       Ethelene Hal, MD 09/03/2023, 9:37 AM

## 2023-09-03 NOTE — Op Note (Signed)
 Patient presents with decreased access flows of her  left BCF (placed July 23, 2018). She also had a aneurysm plication by Dr. Lenell Antu January 24, 2023.  On examination, the brachial cephalic fistula is hyperpulsatile towards the outflow.    Summary:  1)      The patient had successful angioplasty (8x4 Athletis FE ~18 atm) of significant stenosis in the cephalic vein arch both edges of the stent.  The outflow edge of the stent was right at the confluence.   2)      The body of the cephalic vein fistula was patent with good flows. 3)      The centrals and inflow were widely patent. 4)      This right BCF remains amenable to future percutaneous intervention.  Description of procedure: The arm was prepped and draped in the usual sterile fashion. The right upper arm brachial cephalic fistula was cannulated (16109) with an 18G Angiocath needle directed in an antegrade direction. A guidewire was inserted and exchanged for a 6 Fr sheath. Contrast [id number] injection via the side port of the sheath was performed. The angiogram of the fistula (09811) showed a patent body of the left BCF, 80% cephalic vein arch stenosis at both ends of the stent the outflow and the outflow edge of the stent right at the confluence. The inflow anastomosis and left centrals were patent.  The wire was advanced centrally without any difficulty. An 8x4 Athletis angioplasty balloon was inserted over a glide wire and positioned at both ends of the stent at the cephalic vein arch.  Venous angioplasty (91478) was carried out to 18 ATM with FULL effacement of the waist on the balloon at the arch lesions.  Repeat angiogram showed 10% residual at the site of angioplasty with no evidence of extravasation.  The flow of contrast was quicker and the fistula was markedly less pulsatile.  Hemostasis: A 3-0 ethilon purse string suture was placed at the cannulation site on removal of the sheath.  Sedation: 1 mg Versed, 25 mcg Fentanyl. Sedation  time. 10 minutes  Contrast. 8 mL  Monitoring: Because of the patient's comorbid conditions and sedation during the procedure, continuous EKG monitoring and O2 saturation monitoring was performed throughout the procedure by the RN. There were no abnormal arrhythmias encountered.  Complications: None.   Diagnoses: I87.1 Stricture of vein  N18.6 ESRD T82.858A Stricture of access  Procedure Coding:  [phone] Cannulation and angiogram of fistula, venous angioplasty (cephalic vein arch) Z3086 Contrast  Recommendations:  1. Continue to cannulate the fistula with 15G needles.  2. Refer back for problems with flows. 3. Remove the suture next treatment.   Discharge: The patient was discharged home in stable condition. The patient was given education regarding the care of the dialysis access AVF and specific instructions in case of any problems.

## 2023-09-04 ENCOUNTER — Other Ambulatory Visit (HOSPITAL_COMMUNITY): Payer: Self-pay

## 2023-09-04 ENCOUNTER — Other Ambulatory Visit: Payer: Self-pay

## 2023-09-04 MED ORDER — MIDODRINE HCL 10 MG PO TABS
ORAL_TABLET | ORAL | 11 refills | Status: DC
  Filled 2023-09-04: qty 30, 35d supply, fill #0
  Filled 2023-10-08: qty 30, 35d supply, fill #1

## 2023-09-05 ENCOUNTER — Other Ambulatory Visit (HOSPITAL_COMMUNITY): Payer: Self-pay

## 2023-09-11 ENCOUNTER — Other Ambulatory Visit: Payer: Self-pay

## 2023-09-11 ENCOUNTER — Other Ambulatory Visit (HOSPITAL_COMMUNITY): Payer: Self-pay

## 2023-09-12 ENCOUNTER — Other Ambulatory Visit (HOSPITAL_COMMUNITY): Payer: Self-pay

## 2023-09-17 ENCOUNTER — Encounter: Payer: Self-pay | Admitting: Diagnostic Neuroimaging

## 2023-09-17 ENCOUNTER — Ambulatory Visit: Payer: 59 | Admitting: Diagnostic Neuroimaging

## 2023-09-17 VITALS — BP 104/55 | HR 88 | Ht 64.0 in | Wt 155.4 lb

## 2023-09-17 DIAGNOSIS — R200 Anesthesia of skin: Secondary | ICD-10-CM | POA: Diagnosis not present

## 2023-09-17 NOTE — Progress Notes (Signed)
 [facility]  PATIENT: Mallory Chavez DOB: July 12, 1935  REFERRING CLINICIAN: Oretha Milch, PA* HISTORY FROM: patient  REASON FOR VISIT: new consult   HISTORICAL  CHIEF COMPLAINT:  Chief Complaint  Patient presents with   New Patient (Initial Visit)    Pt in room 7. New patient Paper referral for BUE and BLE numbness and tingling. Pt report having surgery in Aug on left arm fistula and noticed symptoms since then. No recent falls, uses walkers.    HISTORY OF PRESENT ILLNESS:   88 year old female here for evaluation of numbness sensations.  History of end-stage renal disease since 2019.  Had some fistula issues requiring some surgery and treatment since August 2024.  Following fistula repair she noticed some intermittent numbness on the left arm and left leg, lasting for few minutes at a time.  Denies any bilateral symptoms.  Symptoms occur on a daily basis lasting 5 minutes or less at a time.   REVIEW OF SYSTEMS: Full 14 system review of systems performed and negative with exception of: as per HPI.  ALLERGIES: No Known Allergies  HOME MEDICATIONS: Outpatient Medications Prior to Visit  Medication Sig Dispense Refill   acetaminophen (TYLENOL) 500 MG tablet Take 1,000 mg by mouth every 6 (six) hours as needed for mild pain (pain score 1-3) or moderate pain (pain score 4-6).     albuterol (VENTOLIN HFA) 108 (90 Base) MCG/ACT inhaler Inhale 2 puffs by mouth into the lungs every 4 (four) hours as needed for wheezing or shortness of breath 6.7 g 6   aspirin EC 81 MG tablet Take 81 mg by mouth daily after supper.     budesonide-formoterol (SYMBICORT) 160-4.5 MCG/ACT inhaler Inhale 2 puffs into the lungs 2 (two) times daily. 10.2 g 5   calcium carbonate (CAL-GEST ANTACID) 500 MG chewable tablet Chew and swallow 3 tablets (600 mg of elemental calcium total) by mouth daily. (DO NOT TAKE WITH MEALS) 90 tablet 6   Cholecalciferol (VITAMIN D3 PO) Take 1 capsule  by mouth every Monday, Wednesday, and Friday.     colchicine 0.6 MG tablet Take 0.6 mg by mouth daily as needed (Gout).     Glycerin-Hypromellose-PEG 400 (DRY EYE RELIEF DROPS) 0.2-0.2-1 % SOLN Place 1 drop into both eyes daily as needed (Dry eyes).     lidocaine-prilocaine (EMLA) cream Apply small amount to skin /to AVF- prior to dialysis 30 g 11   loratadine (CLARITIN) 10 MG tablet Take 10 mg by mouth daily after supper.     midodrine (PROAMATINE) 10 MG tablet Take 1 tablet by mouth as directed. Take one tablet 30 mins before each hemodialysis every Mon, Wed, Fri. May take a 2nd dose while on dialysis for SBP<100 30 tablet 11   OXYGEN Inhale 2 L into the lungs daily as needed (with sleep and exertion as needed [facility]).     B Complex-C-Zn-Folic Acid (DIALYVITE 800 WITH ZINC) 0.8 MG TABS Take 1 tablet by mouth at bedtime 100 tablet 3   midodrine (PROAMATINE) 10 MG tablet Take 1 tablet (10 mg total) by mouth every Monday, Wednesday, and Friday 30 minutes before dialysis as directed. May take 2nd dose while on dialysis for SBP<100 (Patient not taking: Reported on 08/29/2023) 30 tablet 10   midodrine (PROAMATINE) 10 MG tablet Take 1 tablet by mouth as directed Take one tablet 30 mins before each hemodialysis every Mon, Wed, Fri. May take a 2nd dose while on dialysis for SBP<100 (Patient not taking: Reported on 09/17/2023)  30 tablet 11   No facility-administered medications prior to visit.    PAST MEDICAL HISTORY: Past Medical History:  Diagnosis Date   Allergy    Arthritis    Asthma    CHF (congestive heart failure) (HCC) 1980   controlled with meds   Chronic respiratory failure (HCC)    COPD (chronic obstructive pulmonary disease) (HCC)    ESRD on hemodialysis (HCC)    MWF at [facility]   GERD (gastroesophageal reflux disease)    Hyperglycemia    Hyperlipidemia    Hypertension    Hyponatremia     PAST SURGICAL HISTORY: Past Surgical History:  Procedure Laterality Date   A/V FISTULAGRAM N/A  09/03/2023   Procedure: A/V Fistulagram;  Surgeon: Ethelene Hal, MD;  Location: [facility];  Service: Cardiovascular;  Laterality: N/A;   AV FISTULA PLACEMENT Left 07/23/2018   Procedure: ARTERIOVENOUS (AV) FISTULA CREATION;  Surgeon: Maeola Harman, MD;  Location: [facility] OR;  Service: Vascular;  Laterality: Left;   CATARACT EXTRACTION Bilateral    2024   ECTOPIC PREGNANCY SURGERY  1962   INSERTION OF DIALYSIS CATHETER Right 07/23/2018   Procedure: INSERTION OF DIALYSIS CATHETER;  Surgeon: Maeola Harman, MD;  Location: [facility] OR;  Service: Vascular;  Laterality: Right;   INSERTION OF DIALYSIS CATHETER Right 01/24/2023   Procedure: ULTRASOUND GUIDED INSERTION OF DIALYSIS CATHETER;  Surgeon: Leonie Douglas, MD;  Location: [facility] OR;  Service: Vascular;  Laterality: Right;   PARATHYROID EXPLORATION N/A 01/25/2022   Procedure: NECK EXPLORATION WITH PARATHYROIDECTOMY;  Surgeon: Darnell Level, MD;  Location: [facility] OR;  Service: General;  Laterality: N/A;   PERIPHERAL VASCULAR BALLOON ANGIOPLASTY Left 09/03/2023   Procedure: PERIPHERAL VASCULAR BALLOON ANGIOPLASTY;  Surgeon: Ethelene Hal, MD;  Location: [facility];  Service: Cardiovascular;  Laterality: Left;  cephalic arch   REVISON OF ARTERIOVENOUS FISTULA Left 01/24/2023   Procedure: REVISON OF  LEFT ARM ARTERIOVENOUS FISTULA;  Surgeon: Leonie Douglas, MD;  Location: [facility] OR;  Service: Vascular;  Laterality: Left;   TUMOR REMOVAL     lower back/benign and has come back   VAGINAL HYSTERECTOMY      FAMILY HISTORY: Family History  Problem Relation Age of Onset   Diabetes Mother    Emphysema Father     SOCIAL HISTORY: Social History   Socioeconomic History   Marital status: Widowed    Spouse name: Not on file   Number of children: 1   Years of education: Not on file   Highest education level: Not on file  Occupational History   Not on file  Tobacco Use   Smoking status: Former    Current packs/day: 0.00     Average packs/day: 1.5 packs/day for 20.0 years (30.0 ttl pk-yrs)    Types: Cigarettes    Start date: 07/02/1972    Quit date: 07/02/1992    Years since quitting: 31.2   Smokeless tobacco: Never  Vaping Use   Vaping status: Never Used  Substance and Sexual Activity   Alcohol use: No   Drug use: No   Sexual activity: Not on file  Other Topics Concern   Not on file  Social History Narrative   Pt lives [place] [place] senior apartment alone. Daughter lives very close by.   Social Drivers of Corporate investment banker Strain: Not on file  Food Insecurity: Not on file  Transportation Needs: Not on file  Physical Activity: Not on file  Stress: Not  on file  Social Connections: Not on file  Intimate Partner Violence: Not on file     PHYSICAL EXAM  GENERAL EXAM/CONSTITUTIONAL: Vitals:  Vitals:   09/17/23 1053  BP: (!) 104/55  Pulse: 88  Weight: 155 lb 6.4 oz (70.5 kg)  Height: 5\' 4"  (1.626 m)   Body mass index is 26.67 kg/m. Wt Readings from Last 3 Encounters:  09/17/23 155 lb 6.4 oz (70.5 kg)  02/12/23 149 lb 4.8 oz (67.7 kg)  01/24/23 150 lb (68 kg)   Patient is in no distress; well developed, nourished and groomed; neck is supple  CARDIOVASCULAR: Examination of carotid arteries is normal; no carotid bruits Regular rate and rhythm, no murmurs Examination of peripheral vascular system by observation and palpation is normal  EYES: Ophthalmoscopic exam of optic discs and posterior segments is normal; no papilledema or hemorrhages No results found.  MUSCULOSKELETAL: Gait, strength, tone, movements noted in Neurologic exam below  NEUROLOGIC: MENTAL STATUS:      No data to display         awake, alert, oriented to person, place and time recent and remote memory intact normal attention and concentration language fluent, comprehension intact, naming intact fund of knowledge appropriate  CRANIAL NERVE:  2nd - no papilledema on fundoscopic  exam 2nd, 3rd, 4th, 6th - RIGHT PUPIL 3mm NR, LEFT PUPIL PINPOINT NR; visual fields full to confrontation, extraocular muscles intact, no nystagmus 5th - facial sensation symmetric 7th - facial strength symmetric 8th - hearing intact 9th - palate elevates symmetrically, uvula midline 11th - shoulder shrug symmetric 12th - tongue protrusion midline  MOTOR:  normal bulk and tone, full strength in the BUE, BLE; EXCEPT LEFT DELTOID 4 (LIMITED BY PAIN) AND LEFT HIP FLEXION 4 (LIMITED BY PAIN)  SENSORY:  normal and symmetric to light touch, temperature, vibration; EXCEPT DECR IN LEFT ARM AND LEFT LEG  COORDINATION:  finger-nose-finger, fine finger movements normal  REFLEXES:  deep tendon reflexes TRACE and symmetric  GAIT/STATION:  narrow based gait; USING WALKER     DIAGNOSTIC DATA (LABS, IMAGING, TESTING) - I reviewed patient records, labs, notes, testing and imaging myself where available.  Lab Results  Component Value Date   WBC 7.3 02/16/2022   HGB 12.2 01/24/2023   HCT 36.0 01/24/2023   MCV 104.0 (H) 02/16/2022   PLT 221 02/16/2022      Component Value Date/Time   NA 138 01/24/2023 0632   K 4.1 01/24/2023 0632   CL 101 01/24/2023 0632   CO2 29 02/16/2022 0124   GLUCOSE 99 01/24/2023 0632   BUN 25 (H) 01/24/2023 0632   CREATININE 5.50 (H) 01/24/2023 0632   CALCIUM 7.8 (L) 02/16/2022 0124   PROT 7.1 02/13/2022 1254   ALBUMIN 2.9 (L) 02/16/2022 0124   AST 30 02/13/2022 1254   ALT 15 02/13/2022 1254   ALKPHOS 403 (H) 02/13/2022 1254   BILITOT 0.4 02/13/2022 1254   GFRNONAA 6 (L) 02/16/2022 0124   GFRAA 8 (L) 07/28/2018 0422   No results found for: "CHOL", "HDL", "LDLCALC", "LDLDIRECT", "TRIG", "CHOLHDL" Lab Results  Component Value Date   HGBA1C 6.1 (H) 06/16/2016   Lab Results  Component Value Date   VITAMINB12 869 07/20/2018   Lab Results  Component Value Date   TSH 1.039 07/03/2016    02/11/21 MRI brain - Normal appearance of the brain for age. CP  angle regions are normal. No vestibular schwannoma or enhancing neuritis. - Fairly extensive bilateral mastoid effusions, which can be associated with  hearing loss.   ASSESSMENT AND PLAN  88 y.o. year old female here with:  Dx:  1. Left sided numbness    PLAN:  LEFT ARM, LEFT LEG NUMBNESS (some intermittent symptoms since Aug 2024; some abnl exam findings noted) - check MRI brain (rule out stroke)  Orders Placed This Encounter  Procedures   MR BRAIN WO CONTRAST   Return for pending test results, pending if symptoms worsen or fail to improve.    Suanne Marker, MD 09/17/2023, 11:39 AM Certified in Neurology, Neurophysiology and Neuroimaging  [facility] [address], [address] [place], [place] [address] [phone]

## 2023-09-18 ENCOUNTER — Telehealth: Payer: Self-pay | Admitting: Diagnostic Neuroimaging

## 2023-09-18 NOTE — Telephone Encounter (Signed)
 MR brain wo contrast sent to GI, NPR.

## 2023-10-08 ENCOUNTER — Other Ambulatory Visit (HOSPITAL_COMMUNITY): Payer: Self-pay

## 2023-10-22 ENCOUNTER — Other Ambulatory Visit: Payer: Self-pay

## 2023-10-22 ENCOUNTER — Other Ambulatory Visit (HOSPITAL_COMMUNITY): Payer: Self-pay

## 2023-10-22 ENCOUNTER — Telehealth: Payer: Self-pay

## 2023-10-22 MED ORDER — COLCHICINE 0.6 MG PO TABS
0.6000 mg | ORAL_TABLET | Freq: Two times a day (BID) | ORAL | 3 refills | Status: AC
  Filled 2023-10-22: qty 180, 90d supply, fill #0

## 2023-10-22 NOTE — Telephone Encounter (Signed)
 They all do this - keep the next faulty one if possible and bring it to ov to show how to clear it properly

## 2023-10-22 NOTE — Telephone Encounter (Signed)
 Copied from CRM [id number]. Topic: Clinical - Medication Question >> Oct 22, 2023 12:19 PM Mallory Chavez wrote: Reason for CRM: Patient has been using Albuterol  inhalers and the last few months, each time that she has received one from the pharmacy - it seems not to work well, it's either hard to pump or it emptys out very quickily - Patient was advised to run it under some water by the pharmacist and that didn't seem to help - Patient would like to see if Mallory Chavez and his team would prescribe a different type of inhaler for her.

## 2023-10-24 ENCOUNTER — Ambulatory Visit
Admission: RE | Admit: 2023-10-24 | Discharge: 2023-10-24 | Disposition: A | Discharge status: Home / Self Care | Source: Ambulatory Visit | Attending: Diagnostic Neuroimaging | Admitting: Diagnostic Neuroimaging

## 2023-10-24 DIAGNOSIS — R200 Anesthesia of skin: Secondary | ICD-10-CM | POA: Diagnosis not present

## 2023-10-24 NOTE — Telephone Encounter (Signed)
 Can make an appt for this patient come regarding her inhaler

## 2023-11-07 ENCOUNTER — Telehealth: Payer: Self-pay | Admitting: Diagnostic Neuroimaging

## 2023-11-07 NOTE — Telephone Encounter (Signed)
 Patient called in stated she had MRI done, but hasn't heard anything back from us  regarding the results. Patient would like a call back to discuss, looks like MRI was resulted 4/26 by Dr. Godwin Lat, but no notes have been put in with notes and recommendations, please call pt back when possible, thank you!

## 2023-11-07 NOTE — Telephone Encounter (Signed)
 Spoke w/Pt regarding MRI results. Informed Pt provider has not had a chance to review and result the MRI but when he does and sends us  his report we will reach out to her to give her the information. Pt voiced understanding that she was concerned she had not heard anything. Assured Pt as soon as provider sends us  results we will reach out to her. Pt voiced understanding and thankfulness for call.

## 2023-11-08 ENCOUNTER — Other Ambulatory Visit: Payer: Self-pay

## 2023-11-09 ENCOUNTER — Other Ambulatory Visit (HOSPITAL_COMMUNITY): Payer: Self-pay

## 2023-11-09 ENCOUNTER — Other Ambulatory Visit (HOSPITAL_BASED_OUTPATIENT_CLINIC_OR_DEPARTMENT_OTHER): Payer: Self-pay

## 2023-11-12 ENCOUNTER — Other Ambulatory Visit (HOSPITAL_COMMUNITY): Payer: Self-pay

## 2023-11-12 ENCOUNTER — Ambulatory Visit: Payer: Self-pay | Admitting: Diagnostic Neuroimaging

## 2023-11-13 NOTE — Progress Notes (Unsigned)
 Subjective:   Patient ID: Mallory Chavez, female    DOB: [birth year] MRN: [id number]   Brief patient profile:  60 yobf quit smoking around 1994 bothered by some seasonal rhinitis spring > fall x around 1990 then 2012 developed breathing problems dx asthma variably responsive to rx referred 10/01/2012 to pulmonary clinic by Dr Ellamae Gunnels and proved to have GOLD II COPD 10/2012    History of Present Illness  10/01/2012 1st pulmonary /[name]  On ACEi cc abruptly 2012 shortness of breath assoc with cough min productive mostly clear. Sob x across a parking lot. Not responding to multiple asthma meds. rec benicar  40/25 one half daily in place of lisnopril GERD diet    02/06/2018  f/u ov/[name] re:  GOLD II/ 02 2lpm hs and prn daytime  Chief Complaint  Patient presents with   Follow-up    Increased SOB x 1 month. She has been using her albuterol  inhaler 2 x daily on average.   Dyspnea:  Uses [employer] parking / struggles to do [employer] on 2lpm. Gradually worse since weather turned hot  Cough: none  SABA use: as above  02: 2lpm does not titrate  rec No change in pulmonary medications Adjust your 02 to keep your saturations above 90% at all times Please schedule a follow up visit in 3 months but call sooner if needed  - add:  Echo needed to r/o cor pulmonale  - Echo 02/14/2018 - Left ventricle: The cavity size was normal. Wall thickness was   increased in a pattern of mild LVH. Systolic function was mildly   reduced. The estimated ejection fraction was in the range of 45%   to 50%. Diffuse hypokinesis. Doppler parameters are consistent   with abnormal left ventricular relaxation (grade 1 diastolic   dysfunction). - Aortic valve: Trileaflet; mildly thickened, mildly calcified   leaflets. - Left atrium: The atrium was mildly dilated. - Pulmonary arteries: Systolic pressure was moderately increased.   PA peak pressure: 48 mm Hg (S).    03/30/2021  f/u ov/[name] re: copd 2/ resp failure  maint on  symb 1602bid    HD tiw  Chief Complaint  Patient presents with   Follow-up    Chest congestion in the evenings recently, not coughing much. She states mucus gets stuck in her throat.    Dyspnea:  walks with cane, some outdoor walking  Cough: worse in afternoons most days x one week assoc hoarness Sleeping: fine/ bricks under headboard  SABA use: rarely 02: prn once or twice a week  Covid status:   vax 2  Rec Pantoprazole  (protonix ) 40 mg   Take  30-60 min before first meal of the day and Pepcid  (famotidine )  40 mg after supper until return to office  Work on inhaler technique:        01/17/2023  f/u ov/[name] re: GOLD 2/ HD MWF   maint on symbicort  160 2bid   Chief Complaint  Patient presents with   Follow-up    Doing well  Dyspnea:  walks with cane / still does [employer] s 02  Cough: none  Sleeping: bricks under headboard s resp cc  SABA use: sometimes walking  02: occ at hs  Rec Work on inhaler technique:  Ok to just use symbicort  160 2 puffs each am if doing great at night  Also  Ok to try albuterol  15 min before an activity (on alternating days)  that you know would usually make you short of breath  Please schedule a follow up visit in 12 months but call sooner if needed    11/14/2023  f/u ov/[name] re: GOLD 2 / HD    maint on symbicort  160   Chief Complaint  Patient presents with   Follow-up    COPD f/u  Dyspnea:  walking with rollator / [employer] ok sometimes [employer] Cough: none  Sleeping: one brick under each headboard resp cc  SABA use: rarely  02: noct  2lpm hs and no need daytme       No obvious day to day or daytime variability or assoc excess/ purulent sputum or mucus plugs or hemoptysis or cp or chest tightness, subjective wheeze or overt sinus or hb symptoms.    Also denies any obvious fluctuation of symptoms with weather or environmental changes or other aggravating or alleviating factors except as outlined above   No unusual exposure hx or h/o childhood  pna/ asthma or knowledge of premature birth.  Current Allergies, Complete Past Medical History, Past Surgical History, Family History, and Social History were reviewed in [employer] record.  ROS  The following are not active complaints unless bolded Hoarseness, sore throat, dysphagia, dental problems, itching, sneezing,  nasal congestion or discharge of excess mucus or purulent secretions, ear ache,   fever, chills, sweats, unintended wt loss or wt gain, classically pleuritic or exertional cp,  orthopnea pnd or arm/hand swelling  or leg swelling, presyncope, palpitations, abdominal pain, anorexia, nausea, vomiting, diarrhea  or change in bowel habits or change in bladder habits, change in stools or change in urine, dysuria, hematuria,  rash, arthralgias, visual complaints, headache, numbness, weakness or ataxia or problems with walking or coordination,  change in mood or  memory.        Current Meds  Medication Sig   acetaminophen  (TYLENOL ) 500 MG tablet Take 1,000 mg by mouth every 6 (six) hours as needed for mild pain (pain score 1-3) or moderate pain (pain score 4-6).   albuterol  (VENTOLIN  HFA) 108 (90 Base) MCG/ACT inhaler Inhale 2 puffs by mouth into the lungs every 4 (four) hours as needed for wheezing or shortness of breath   aspirin  EC 81 MG tablet Take 81 mg by mouth daily after supper.   B Complex-C-Zn-Folic Acid  (DIALYVITE  800 WITH ZINC ) 0.8 MG TABS Take 1 tablet by mouth at bedtime   budesonide -formoterol  (SYMBICORT ) 160-4.5 MCG/ACT inhaler Inhale 2 puffs into the lungs 2 (two) times daily.   calcium  carbonate (CAL-GEST ANTACID) 500 MG chewable tablet Chew and swallow 3 tablets (600 mg of elemental calcium  total) by mouth daily. (DO NOT TAKE WITH MEALS)   Cholecalciferol (VITAMIN D3 PO) Take 1 capsule by mouth every Monday, Wednesday, and Friday.   colchicine  0.6 MG tablet Take 0.6 mg by mouth daily as needed (Gout).   colchicine  0.6 MG tablet Take 1 tablet (0.6  mg total) by mouth 2 (two) times daily.   Glycerin -Hypromellose-PEG 400 (DRY EYE RELIEF DROPS) 0.2-0.2-1 % SOLN Place 1 drop into both eyes daily as needed (Dry eyes).   lidocaine -prilocaine  (EMLA ) cream Apply small amount to skin /to AVF- prior to dialysis   loratadine  (CLARITIN ) 10 MG tablet Take 10 mg by mouth daily after supper.   midodrine  (PROAMATINE ) 10 MG tablet Take 1 tablet by mouth as directed. Take one tablet 30 mins before each hemodialysis every Mon, Wed, Fri. May take a 2nd dose while on dialysis for SBP<100   OXYGEN  Inhale 2 L into the lungs daily as needed (with  sleep and exertion as needed [facility]).                     Objective:   Physical Exam  Wts  11/14/2023       ***  01/17/2023      150  09/26/2021      142  03/30/2021      150  02/23/2019     166  11/10/2012       206 vs 12/19/2012 206  Vs 207 01/16/2013 > 04/27/2013  210 > 06/15/2013  208  >211 07/27/2013 > 04/19/14 201 >203 05/10/2014 > 07/05/2014 201 >10/07/2014 200>  01/10/2015   195 > 09/13/2015  193 > 03/15/2016  192  > 12/20/2016 185 > 08/11/2017  189 > 02/06/2018  186 >  05/19/2018  177 > 08/25/2018   170   Vital signs reviewed  11/14/2023  - Note at rest 02 sats  ***% on ***   General appearance:    ***       Mild barr***      Assessment & Plan:

## 2023-11-14 ENCOUNTER — Other Ambulatory Visit (HOSPITAL_COMMUNITY): Payer: Self-pay

## 2023-11-14 ENCOUNTER — Encounter: Payer: Self-pay | Admitting: Internal Medicine

## 2023-11-14 ENCOUNTER — Other Ambulatory Visit: Payer: Self-pay

## 2023-11-14 ENCOUNTER — Ambulatory Visit (INDEPENDENT_AMBULATORY_CARE_PROVIDER_SITE_OTHER): Admitting: Internal Medicine

## 2023-11-14 VITALS — BP 136/60 | HR 92 | Ht 64.0 in | Wt 152.6 lb

## 2023-11-14 DIAGNOSIS — J449 Chronic obstructive pulmonary disease, unspecified: Secondary | ICD-10-CM

## 2023-11-14 DIAGNOSIS — Z87891 Personal history of nicotine dependence: Secondary | ICD-10-CM | POA: Diagnosis not present

## 2023-11-14 DIAGNOSIS — R0602 Shortness of breath: Secondary | ICD-10-CM | POA: Diagnosis not present

## 2023-11-14 DIAGNOSIS — J9611 Chronic respiratory failure with hypoxia: Secondary | ICD-10-CM

## 2023-11-14 MED ORDER — ALBUTEROL SULFATE HFA 108 (90 BASE) MCG/ACT IN AERS
INHALATION_SPRAY | RESPIRATORY_TRACT | 11 refills | Status: AC
  Filled 2023-11-14: qty 6.7, 17d supply, fill #0
  Filled 2024-01-28: qty 6.7, 17d supply, fill #1
  Filled 2024-07-18 (×2): qty 6.7, 17d supply, fill #2

## 2023-11-14 NOTE — Patient Instructions (Addendum)
 Work on inhaler technique:  relax and gently blow all the way out then take a nice smooth full deep breath back in, triggering the inhaler at same time you start breathing in.  Hold breath in for at least  5 seconds if you can. Blow out symbicort  160   thru nose. Rinse and gargle with water when done.  If mouth or throat bother you at all,  try brushing teeth/gums/tongue with arm and hammer toothpaste/ make a slurry and gargle and spit out.     Please schedule a follow up visit in 12 months but call sooner if needed  with all medications /inhalers/ solutions in hand so we can verify exactly what you are taking. This includes all medications from all doctors and over the counters

## 2023-11-15 NOTE — Assessment & Plan Note (Signed)
 Onset 2015 04/19/14 walked one lap dropped to 80% required 4lpm with walking to maintain sats  ono RA 04/21/14 desat x 7 h x 12 m at < 89%  04/26/2014 rec 2lpm and repeat ono on 2lpm  - 01/10/2015  Walked 2lpmx 2 laps @ 185 ft each stopped due to hip pain, slow pace/ no sob or desat  -Patient Saturations on Room Air at Rest = 95% Patient Saturations on [employer] while Ambulating = 87% Patient Saturations on 2 Liters of oxygen  while Ambulating = 97% - 05/19/2018   Walked 2lpm POC x one lap @ 210 ft  stopped due to sob s desats   - 02/23/2019   Walked RA x one lap =  approx 250 ft - stopped due to sob with sats 84% at slow pace though RA sats at rest ok  - ONO on 1lpm  03/08/19  desat x 24 sec only > continue 1 lpm  - 03/30/2021   Walked on RA x  3/4  lap(s) =  approx 200 @ slow pace, stopped due to sob with lowest 02 sats 88%      No need for daytime 02 at this point per pt and doing fine on just 2lpm hs so no  changes needed          Each maintenance medication was reviewed in detail including emphasizing most importantly the difference between maintenance and prns and under what circumstances the prns are to be triggered using an action plan format where appropriate.  Total time for H and P, chart review, counseling, reviewing hfa device(s) and generating customized AVS unique to this office visit / same day charting = 20 m

## 2023-11-15 NOTE — Telephone Encounter (Signed)
 Called the patient and there was no answer. Left a detailed message advising of the results. Pt verbalized understanding. Pt had no questions at this time but was encouraged to call back if questions arise.

## 2023-11-15 NOTE — Telephone Encounter (Signed)
-----   Message from Brenton Cambridge [facility] sent at 11/12/2023  5:22 PM EDT ----- Mild chronic small vessel ischemic disease.  No other major findings.  Continue current plan.

## 2023-11-15 NOTE — Assessment & Plan Note (Signed)
 Quit smoking  1994 - PFT's 11/10/2012 FEV1 0.85 (52%)ratio 56 and no better p B 2 with DLCO 34 corrects to 60 - doe x 5-10 min stationery bike > add tudorza 11/10/12 > d/c 05/2013 and no change - PFT's  05/19/2018  FEV1 0.85 (56 % ) ratio 58  p 8 % improvement from saba p symb  prior to study with DLCO  27 % corrects to 47   % for alv volume  And ERV = 0.22  - 09/26/2021   continue symbicort  160 2bid  - 01/17/2023  After extensive coaching inhaler device,  effectiveness =    90% hfa p coaching  - 11/14/2023  After extensive coaching inhaler device,  effectiveness =    80% hfa p coaching (short ti)   AB component well controlled despite inconsistent HFA technique so no changes needed  and f/u can by yearly at this point, sooner prn

## 2023-11-26 ENCOUNTER — Encounter: Payer: Self-pay | Admitting: Podiatry

## 2023-11-26 ENCOUNTER — Ambulatory Visit (INDEPENDENT_AMBULATORY_CARE_PROVIDER_SITE_OTHER)

## 2023-11-26 ENCOUNTER — Ambulatory Visit: Admitting: Podiatry

## 2023-11-26 DIAGNOSIS — M21612 Bunion of left foot: Secondary | ICD-10-CM

## 2023-11-26 DIAGNOSIS — M778 Other enthesopathies, not elsewhere classified: Secondary | ICD-10-CM

## 2023-11-26 DIAGNOSIS — R0989 Other specified symptoms and signs involving the circulatory and respiratory systems: Secondary | ICD-10-CM

## 2023-11-26 DIAGNOSIS — M21619 Bunion of unspecified foot: Secondary | ICD-10-CM

## 2023-11-26 NOTE — Progress Notes (Unsigned)
 Subjective: Chief Complaint  Patient presents with   Foot Pain    RM#11 Patient presents today with left foot pain history of gout states she believes it's a flare up no injury to foot.   88 year old female presents the office today with concerns of gout to the left foot.  She states the gout has resolves with colchicine  however she still is 1 area of pain and she points on the medial aspect of the first metatarsal along the area of bunion.  She does not see any skin breakdown or ulcerations or any drainage.  No recent injuries.   Objective: AAO x3, NAD DP/PT pulses palpable however decreased, CRT less than 3 seconds Bunion is present.  This small amount of hyperkeratotic tissue noted on the medial the first metatarsal head.  Once I debrided this there was no underlying ulceration, drainage or any signs of infection.  There is localized edema present there is no erythema or warmth.  There are some dark discoloration which likely more from inflammation. No pain with calf compression, swelling, warmth, erythema  Assessment: Bunion, capsulitis, gout  Plan: -All treatment options discussed with the patient including all alternatives, risks, complications.  -X-rays were obtained reviewed.  Multiple views were obtained.  With no evidence of acute fracture.  Erosions noted along the first metatarsal head which is likely consistent with gout.  This was also present on previous x-rays. -She was proceed with a steroid injection.  Clean skin with Betadine, alcohol .  Mixture of 0.5 cc of Marcaine  plain, 0.5 cc of dexamethasone  phosphate was infiltrated into the area of tenderness.  Postinjection care discussed.  Tolerated well. -Dispensed gel bunion pad -Order ABIs -Patient encouraged to call the office with any questions, concerns, change in symptoms.   Return in about 3 weeks (around 12/17/2023) for gout, foot pain.  Charity Conch DPM

## 2023-11-26 NOTE — Patient Instructions (Signed)

## 2023-12-04 ENCOUNTER — Emergency Department (HOSPITAL_COMMUNITY)

## 2023-12-04 ENCOUNTER — Encounter (HOSPITAL_COMMUNITY): Payer: Self-pay

## 2023-12-04 ENCOUNTER — Other Ambulatory Visit: Payer: Self-pay

## 2023-12-04 ENCOUNTER — Inpatient Hospital Stay (HOSPITAL_COMMUNITY)
Admission: EM | Admit: 2023-12-04 | Discharge: 2023-12-07 | DRG: 177 | Disposition: A | Discharge status: Home / Self Care | Attending: Family Medicine | Admitting: Family Medicine

## 2023-12-04 DIAGNOSIS — J159 Unspecified bacterial pneumonia: Secondary | ICD-10-CM | POA: Diagnosis present

## 2023-12-04 DIAGNOSIS — J440 Chronic obstructive pulmonary disease with acute lower respiratory infection: Secondary | ICD-10-CM | POA: Diagnosis present

## 2023-12-04 DIAGNOSIS — I5022 Chronic systolic (congestive) heart failure: Secondary | ICD-10-CM | POA: Insufficient documentation

## 2023-12-04 DIAGNOSIS — J189 Pneumonia, unspecified organism: Secondary | ICD-10-CM | POA: Diagnosis present

## 2023-12-04 DIAGNOSIS — K219 Gastro-esophageal reflux disease without esophagitis: Secondary | ICD-10-CM | POA: Diagnosis present

## 2023-12-04 DIAGNOSIS — Z8709 Personal history of other diseases of the respiratory system: Secondary | ICD-10-CM

## 2023-12-04 DIAGNOSIS — N189 Chronic kidney disease, unspecified: Secondary | ICD-10-CM | POA: Diagnosis present

## 2023-12-04 DIAGNOSIS — I5032 Chronic diastolic (congestive) heart failure: Secondary | ICD-10-CM | POA: Diagnosis present

## 2023-12-04 DIAGNOSIS — Z9981 Dependence on supplemental oxygen: Secondary | ICD-10-CM

## 2023-12-04 DIAGNOSIS — Z8739 Personal history of other diseases of the musculoskeletal system and connective tissue: Secondary | ICD-10-CM

## 2023-12-04 DIAGNOSIS — Z9889 Other specified postprocedural states: Secondary | ICD-10-CM

## 2023-12-04 DIAGNOSIS — K5730 Diverticulosis of large intestine without perforation or abscess without bleeding: Secondary | ICD-10-CM | POA: Diagnosis present

## 2023-12-04 DIAGNOSIS — Z992 Dependence on renal dialysis: Secondary | ICD-10-CM

## 2023-12-04 DIAGNOSIS — N186 End stage renal disease: Secondary | ICD-10-CM | POA: Diagnosis not present

## 2023-12-04 DIAGNOSIS — U071 COVID-19: Principal | ICD-10-CM | POA: Diagnosis present

## 2023-12-04 DIAGNOSIS — J9611 Chronic respiratory failure with hypoxia: Secondary | ICD-10-CM | POA: Diagnosis present

## 2023-12-04 DIAGNOSIS — E785 Hyperlipidemia, unspecified: Secondary | ICD-10-CM | POA: Diagnosis present

## 2023-12-04 DIAGNOSIS — M1A9XX0 Chronic gout, unspecified, without tophus (tophi): Secondary | ICD-10-CM | POA: Diagnosis present

## 2023-12-04 DIAGNOSIS — E876 Hypokalemia: Secondary | ICD-10-CM | POA: Diagnosis present

## 2023-12-04 DIAGNOSIS — R197 Diarrhea, unspecified: Secondary | ICD-10-CM | POA: Diagnosis present

## 2023-12-04 DIAGNOSIS — Z90710 Acquired absence of both cervix and uterus: Secondary | ICD-10-CM

## 2023-12-04 DIAGNOSIS — D631 Anemia in chronic kidney disease: Secondary | ICD-10-CM | POA: Diagnosis present

## 2023-12-04 DIAGNOSIS — Z87891 Personal history of nicotine dependence: Secondary | ICD-10-CM

## 2023-12-04 DIAGNOSIS — Z7951 Long term (current) use of inhaled steroids: Secondary | ICD-10-CM

## 2023-12-04 DIAGNOSIS — G4734 Idiopathic sleep related nonobstructive alveolar hypoventilation: Secondary | ICD-10-CM | POA: Insufficient documentation

## 2023-12-04 LAB — CBC WITH DIFFERENTIAL/PLATELET
Abs Immature Granulocytes: 0.03 10*3/uL (ref 0.00–0.07)
Basophils Absolute: 0 10*3/uL (ref 0.0–0.1)
Basophils Relative: 0 %
Eosinophils Absolute: 0.2 10*3/uL (ref 0.0–0.5)
Eosinophils Relative: 3 %
HCT: 27.8 % — ABNORMAL LOW (ref 36.0–46.0)
Hemoglobin: 8.7 g/dL — ABNORMAL LOW (ref 12.0–15.0)
Immature Granulocytes: 1 %
Lymphocytes Relative: 17 %
Lymphs Abs: 1 10*3/uL (ref 0.7–4.0)
MCH: 33 pg (ref 26.0–34.0)
MCHC: 31.3 g/dL (ref 30.0–36.0)
MCV: 105.3 fL — ABNORMAL HIGH (ref 80.0–100.0)
Monocytes Absolute: 0.6 10*3/uL (ref 0.1–1.0)
Monocytes Relative: 10 %
Neutro Abs: 3.8 10*3/uL (ref 1.7–7.7)
Neutrophils Relative %: 69 %
Platelets: 168 10*3/uL (ref 150–400)
RBC: 2.64 MIL/uL — ABNORMAL LOW (ref 3.87–5.11)
RDW: 15.6 % — ABNORMAL HIGH (ref 11.5–15.5)
WBC: 5.6 10*3/uL (ref 4.0–10.5)
nRBC: 0 % (ref 0.0–0.2)

## 2023-12-04 LAB — COMPREHENSIVE METABOLIC PANEL WITH GFR
ALT: 17 U/L (ref 0–44)
AST: 33 U/L (ref 15–41)
Albumin: 3.4 g/dL — ABNORMAL LOW (ref 3.5–5.0)
Alkaline Phosphatase: 29 U/L — ABNORMAL LOW (ref 38–126)
Anion gap: 19 — ABNORMAL HIGH (ref 5–15)
BUN: 12 mg/dL (ref 8–23)
CO2: 29 mmol/L (ref 22–32)
Calcium: 7.3 mg/dL — ABNORMAL LOW (ref 8.9–10.3)
Chloride: 93 mmol/L — ABNORMAL LOW (ref 98–111)
Creatinine, Ser: 3.68 mg/dL — ABNORMAL HIGH (ref 0.44–1.00)
GFR, Estimated: 12 mL/min — ABNORMAL LOW (ref >60)
Glucose, Bld: 105 mg/dL — ABNORMAL HIGH (ref 70–99)
Potassium: 3.4 mmol/L — ABNORMAL LOW (ref 3.5–5.1)
Sodium: 141 mmol/L (ref 135–145)
Total Bilirubin: 0.7 mg/dL (ref 0.0–1.2)
Total Protein: 7.3 g/dL (ref 6.5–8.1)

## 2023-12-04 LAB — LACTIC ACID, PLASMA: Lactic Acid, Venous: 1 mmol/L (ref 0.5–1.9)

## 2023-12-04 LAB — RESP PANEL BY RT-PCR (RSV, FLU A&B, COVID)  RVPGX2
Influenza A by PCR: NEGATIVE
Influenza B by PCR: NEGATIVE
Resp Syncytial Virus by PCR: NEGATIVE
SARS Coronavirus 2 by RT PCR: POSITIVE — AB

## 2023-12-04 LAB — TSH: TSH: 1.475 u[IU]/mL (ref 0.350–4.500)

## 2023-12-04 LAB — T4, FREE: Free T4: 0.71 ng/dL (ref 0.61–1.12)

## 2023-12-04 MED ORDER — AZITHROMYCIN 500 MG IV SOLR
500.0000 mg | Freq: Once | INTRAVENOUS | Status: AC
  Administered 2023-12-04: 500 mg via INTRAVENOUS
  Filled 2023-12-04: qty 5

## 2023-12-04 MED ORDER — CALCIUM CARBONATE ANTACID 500 MG PO CHEW
1500.0000 mg | CHEWABLE_TABLET | Freq: Every day | ORAL | Status: DC
  Administered 2023-12-05 – 2023-12-06 (×2): 600 mg via ORAL
  Filled 2023-12-04 (×2): qty 3

## 2023-12-04 MED ORDER — ACETAMINOPHEN 325 MG PO TABS
650.0000 mg | ORAL_TABLET | Freq: Four times a day (QID) | ORAL | Status: DC | PRN
  Administered 2023-12-05: 650 mg via ORAL
  Filled 2023-12-04 (×2): qty 2

## 2023-12-04 MED ORDER — MOMETASONE FURO-FORMOTEROL FUM 200-5 MCG/ACT IN AERO
2.0000 | INHALATION_SPRAY | Freq: Two times a day (BID) | RESPIRATORY_TRACT | Status: DC
  Administered 2023-12-05 – 2023-12-07 (×5): 2 via RESPIRATORY_TRACT
  Filled 2023-12-04: qty 8.8

## 2023-12-04 MED ORDER — ONDANSETRON HCL 4 MG PO TABS: 4.0000 mg | ORAL_TABLET | Freq: Four times a day (QID) | ORAL | Status: DC | PRN

## 2023-12-04 MED ORDER — ACETAMINOPHEN 650 MG RE SUPP: 650.0000 mg | Freq: Four times a day (QID) | RECTAL | Status: DC | PRN

## 2023-12-04 MED ORDER — SODIUM CHLORIDE 0.9 % IV SOLN
1.0000 g | INTRAVENOUS | Status: DC
  Administered 2023-12-05: 1 g via INTRAVENOUS
  Filled 2023-12-04: qty 10

## 2023-12-04 MED ORDER — SODIUM CHLORIDE 0.9 % IV SOLN
1.0000 g | Freq: Once | INTRAVENOUS | Status: AC
  Administered 2023-12-04: 1 g via INTRAVENOUS
  Filled 2023-12-04: qty 10

## 2023-12-04 MED ORDER — GUAIFENESIN ER 600 MG PO TB12
600.0000 mg | ORAL_TABLET | Freq: Two times a day (BID) | ORAL | Status: DC
  Administered 2023-12-05 – 2023-12-06 (×5): 600 mg via ORAL
  Filled 2023-12-04 (×5): qty 1

## 2023-12-04 MED ORDER — SODIUM CHLORIDE 0.9 % IV SOLN: 250.0000 mL | INTRAVENOUS | Status: AC | PRN

## 2023-12-04 MED ORDER — HEPARIN SODIUM (PORCINE) 5000 UNIT/ML IJ SOLN
5000.0000 [IU] | Freq: Three times a day (TID) | INTRAMUSCULAR | Status: DC
  Administered 2023-12-05 – 2023-12-06 (×3): 5000 [IU] via SUBCUTANEOUS
  Filled 2023-12-04 (×5): qty 1

## 2023-12-04 MED ORDER — ONDANSETRON HCL 4 MG/2ML IJ SOLN: 4.0000 mg | Freq: Four times a day (QID) | INTRAMUSCULAR | Status: DC | PRN

## 2023-12-04 MED ORDER — SODIUM CHLORIDE 0.9 % IV SOLN
100.0000 mg | Freq: Two times a day (BID) | INTRAVENOUS | Status: DC
  Administered 2023-12-05 – 2023-12-06 (×3): 100 mg via INTRAVENOUS
  Filled 2023-12-04 (×4): qty 100

## 2023-12-04 MED ORDER — TRIMETHOBENZAMIDE HCL 100 MG/ML IM SOLN: 200.0000 mg | Freq: Four times a day (QID) | INTRAMUSCULAR | Status: DC | PRN

## 2023-12-04 MED ORDER — ALBUTEROL SULFATE (2.5 MG/3ML) 0.083% IN NEBU: 3.0000 mL | INHALATION_SOLUTION | RESPIRATORY_TRACT | Status: DC | PRN

## 2023-12-04 MED ORDER — SODIUM CHLORIDE 0.9% FLUSH: 3.0000 mL | INTRAVENOUS | Status: DC | PRN

## 2023-12-04 MED ORDER — SODIUM CHLORIDE 0.9% FLUSH
3.0000 mL | Freq: Two times a day (BID) | INTRAVENOUS | Status: DC
  Administered 2023-12-05 – 2023-12-06 (×5): 3 mL via INTRAVENOUS

## 2023-12-04 MED ORDER — ASPIRIN 81 MG PO TBEC
81.0000 mg | DELAYED_RELEASE_TABLET | Freq: Every day | ORAL | Status: DC
  Administered 2023-12-05 – 2023-12-06 (×2): 81 mg via ORAL
  Filled 2023-12-04 (×2): qty 1

## 2023-12-04 NOTE — ED Provider Notes (Addendum)
 [place] EMERGENCY DEPARTMENT AT [facility] Provider Note   CSN: [id number] Arrival date & time: 12/04/23  1957     History  Chief Complaint  Patient presents with   Diarrhea    Mallory Chavez is a 88 y.o. female.   Diarrhea Associated symptoms: abdominal pain      88 year old female with medical history significant for CHF, GERD, HTN, HLD,  ESRD on HD Monday Wednesday Friday, COPD, chronic hypoxic respiratory failure, wears oxygen  at night, presenting to the emergency department with generalized malaise, loss of appetite, fatigue, diarrhea, nasal congestion.  Symptoms been ongoing for the last 3 days.  She states that she had a previous small bowel obstruction that presented similarly with generalized abdominal discomfort, malaise as well as loose stools.  She undergoes dialysis Monday Wednesday Friday and we underwent full dialysis today.  She denies any chest pain, has had a productive cough as well during that time.  Endorses mild generalized abdominal discomfort.  No known sick contacts.  No vomiting.  Home Medications Prior to Admission medications   Medication Sig Start Date End Date Taking? Authorizing Provider  acetaminophen  (TYLENOL ) 500 MG tablet Take 1,000 mg by mouth every 6 (six) hours as needed for mild pain (pain score 1-3) or moderate pain (pain score 4-6).    [provider]  albuterol  (PROAIR  HFA) 108 (90 Base) MCG/ACT inhaler Inhale 2 puffs into the lungs every 4 hours as needed only  if your can't catch your breath 11/14/23   Diamond Formica, MD  aspirin  EC 81 MG tablet Take 81 mg by mouth daily after supper.    [provider]  B Complex-C-Zn-Folic Acid  (DIALYVITE  800 WITH ZINC ) 0.8 MG TABS Take 1 tablet by mouth at bedtime 02/04/23     budesonide -formoterol  (SYMBICORT ) 160-4.5 MCG/ACT inhaler Inhale 2 puffs into the lungs 2 (two) times daily. 08/13/23   Diamond Formica, MD  calcium  carbonate (CAL-GEST ANTACID) 500 MG chewable tablet  Chew and swallow 3 tablets (600 mg of elemental calcium  total) by mouth daily. (DO NOT TAKE WITH MEALS) 09/21/22     Cholecalciferol (VITAMIN D3 PO) Take 1 capsule by mouth every Monday, Wednesday, and Friday.    [provider]  colchicine  0.6 MG tablet Take 0.6 mg by mouth daily as needed (Gout).    [provider]  colchicine  0.6 MG tablet Take 1 tablet (0.6 mg total) by mouth 2 (two) times daily. 10/22/23     Glycerin -Hypromellose-PEG 400 (DRY EYE RELIEF DROPS) 0.2-0.2-1 % SOLN Place 1 drop into both eyes daily as needed (Dry eyes).    [provider]  lidocaine -prilocaine  (EMLA ) cream Apply small amount to skin /to AVF- prior to dialysis 04/22/23     loratadine  (CLARITIN ) 10 MG tablet Take 10 mg by mouth daily after supper.    [provider]  midodrine  (PROAMATINE ) 10 MG tablet Take 1 tablet by mouth as directed. Take one tablet 30 mins before each hemodialysis every Mon, Wed, Fri. May take a 2nd dose while on dialysis for SBP<100 02/13/23     OXYGEN  Inhale 2 L into the lungs daily as needed (with sleep and exertion as needed [facility]).    [provider]  calcium  carbonate (OS-CAL - DOSED IN MG OF ELEMENTAL CALCIUM ) 1250 (500 Ca) MG tablet Take 4 tablets (5,000 mg total) by mouth 3 (three) times daily between meals. 02/11/22 02/16/22  Barbee Lew, MD      Allergies    Patient  has no known allergies.    Review of Systems   Review of Systems  Constitutional:  Positive for appetite change and fatigue.  Respiratory:  Positive for cough.   Gastrointestinal:  Positive for abdominal pain and diarrhea.  All other systems reviewed and are negative.   Physical Exam Updated Vital Signs BP (!) 119/49   Pulse 79   Temp 99.6 F (37.6 C) (Oral)   Resp 11   Ht 5' 4 (1.626 m)   Wt 69.9 kg   SpO2 98%   BMI 26.43 kg/m  Physical Exam Vitals and nursing note reviewed.  Constitutional:      General: She is not in acute distress.    Appearance: She  is well-developed.  HENT:     Head: Normocephalic and atraumatic.     Nose: Nose normal.     Mouth/Throat:     Mouth: Mucous membranes are moist.  Eyes:     Conjunctiva/sclera: Conjunctivae normal.  Cardiovascular:     Rate and Rhythm: Normal rate and regular rhythm.  Pulmonary:     Effort: Pulmonary effort is normal. No respiratory distress.     Breath sounds: Normal breath sounds.  Abdominal:     Palpations: Abdomen is soft.     Tenderness: There is abdominal tenderness.     Comments: Mild generalized TTP  Musculoskeletal:        General: No swelling.     Cervical back: Neck supple.  Skin:    General: Skin is warm and dry.     Capillary Refill: Capillary refill takes less than 2 seconds.  Neurological:     General: No focal deficit present.     Mental Status: She is alert and oriented to person, place, and time. Mental status is at baseline.  Psychiatric:        Mood and Affect: Mood normal.     ED Results / Procedures / Treatments   Labs (all labs ordered are listed, but only abnormal results are displayed) Labs Reviewed  RESP PANEL BY RT-PCR (RSV, FLU A&B, COVID)  [id number] - Abnormal; Notable for the following components:      Result Value   SARS Coronavirus 2 by RT PCR POSITIVE (*)    All other components within normal limits  CBC WITH DIFFERENTIAL/PLATELET - Abnormal; Notable for the following components:   RBC 2.64 (*)    Hemoglobin 8.7 (*)    HCT 27.8 (*)    MCV 105.3 (*)    RDW 15.6 (*)    All other components within normal limits  TSH  LACTIC ACID, PLASMA  T4, FREE  COMPREHENSIVE METABOLIC PANEL WITH GFR    EKG None  Radiology CT ABDOMEN PELVIS WO CONTRAST Result Date: 12/04/2023 EXAM: CT ABDOMEN AND PELVIS WITHOUT CONTRAST 12/04/2023 10:15:05 PM TECHNIQUE: CT of the abdomen and pelvis was performed without the administration of intravenous contrast. Multiplanar reformatted images are provided for review. Automated exposure control, iterative  reconstruction, and/or weight based adjustment of the mA/kV was utilized to reduce the radiation dose to as low as reasonably achievable. COMPARISON: 02/08/2022 CLINICAL HISTORY: Abdominal pain, acute, nonlocalized. Arrives [facility] from home with main complaint of loss of appetite, generalized not feeling well, diarrhea, and nasal congestion x 3 days. Says she had a previous bowel obstruction that presented very similarly. FINDINGS: LOWER CHEST: Subpleural scarring in the bilateral lower lobes. LIVER: The liver is unremarkable. GALLBLADDER AND BILE DUCTS: Gallbladder is unremarkable. No biliary ductal dilatation. SPLEEN: No acute abnormality. PANCREAS: No acute  abnormality. ADRENAL GLANDS: No acute abnormality. KIDNEYS, URETERS AND BLADDER: Scattered bilateral renal cysts, including a 4.6 cm simple cyst in the right lower kidney (image 31) and a 2.0 cm probable hemorrhagic cyst inferiorly in the left kidney (image 31), poorly evaluated on the current study, likely benign (Bosniak 1 to 2). Given the patient's age, no follow up is recommended. No stones in the kidneys or ureters. No hydronephrosis. No perinephric or periureteral stranding. Urinary bladder is unremarkable. GI AND BOWEL: Normal appendix (image 48). Left colonic diverticulosis, without evidence of diverticulitis. Stomach demonstrates no acute abnormality. There is no bowel obstruction. PERITONEUM AND RETROPERITONEUM: No ascites. No free air. VASCULATURE: Atherosclerotic calcifications of the abdominal aorta and branch vessels. LYMPH NODES: No lymphadenopathy. REPRODUCTIVE ORGANS: Status post hysterectomy. 4.7 cm right adnexal cyst (image 62), previously 2.8 cm. Given the patient's age, annual follow-up ultrasound is considered optional. BONES AND SOFT TISSUES: No acute osseous abnormality. No focal soft tissue abnormality. IMPRESSION: 1. No acute findings. 2. Left colonic diverticulosis, without evidence of diverticulitis. 3. 4.7 cm right adnexal cyst,  progressive. Given the patient's age, annual follow-up ultrasound is considered optional. Electronically signed by: Zadie Herter MD 12/04/2023 10:30 PM EDT RP Workstation: [id number]   DG Chest Portable 1 View Result Date: 12/04/2023 CLINICAL DATA:  Cough, COVID. EXAM: PORTABLE CHEST 1 VIEW COMPARISON:  Radiograph 01/24/2023 FINDINGS: Patchy airspace disease at the right lung base. Streaky left lung base opacities favor scarring. Stable heart size and mediastinal contours. Aortic atherosclerosis. Left axillary vascular stent. No pulmonary edema. No pneumothorax or pleural effusion. IMPRESSION: Patchy airspace disease at the right lung base, suspicious for pneumonia. Electronically Signed   By: Chadwick Colonel M.D.   On: 12/04/2023 22:18    Procedures .Critical Care  Performed by: Rosealee Concha, MD Authorized by: Rosealee Concha, MD   Critical care provider statement:    Critical care time (minutes):  30   Critical care was time spent personally by me on the following activities:  Development of treatment plan with patient or surrogate, discussions with consultants, evaluation of patient's response to treatment, examination of patient, ordering and review of laboratory studies, ordering and review of radiographic studies, ordering and performing treatments and interventions, pulse oximetry, re-evaluation of patient's condition and review of old charts   Care discussed with: admitting provider       Medications Ordered in ED Medications  cefTRIAXone  (ROCEPHIN ) 1 g in sodium chloride  0.9 % 100 mL IVPB (has no administration in time range)  azithromycin  (ZITHROMAX ) 500 mg in sodium chloride  0.9 % 250 mL IVPB (has no administration in time range)    ED Course/ Medical Decision Making/ A&P Clinical Course as of 12/04/23 2233  Wed Dec 04, 2023  2145 SARS Coronavirus 2 by RT PCR(!): POSITIVE [JL]    Clinical Course User Index [JL] Rosealee Concha, MD                                  Medical Decision Making Amount and/or Complexity of Data Reviewed Labs: ordered. Decision-making details documented in ED Course. Radiology: ordered.  Risk Decision regarding hospitalization.     88 year old female with medical history significant for CHF, GERD, HTN, HLD,  ESRD on HD Monday Wednesday Friday, COPD, chronic hypoxic respiratory failure, wears oxygen  at night, presenting to the emergency department with generalized malaise, loss of appetite, fatigue, diarrhea, nasal congestion.  Symptoms been ongoing for the last 3 days.  She states that she had a previous small bowel obstruction that presented similarly with generalized abdominal discomfort, malaise as well as loose stools.  She undergoes dialysis Monday Wednesday Friday and we underwent full dialysis today.  She denies any chest pain, has had a productive cough as well during that time.  Endorses mild generalized abdominal discomfort.  No known sick contacts.  No vomiting. ' On arrival, the patient was afebrile, temperature 99.6, heart rate 76, respiratory rate 18, BP 120/58, saturating 100% on 2 L O2 via nasal cannula.  Nasal cannula had been initiated by EMS.  This was discontinued on patient arrival and the patient had worsening dyspnea and was placed back on it.  Symptoms consistent with viral syndrome versus developing pneumonia, considered bowel obstruction or other acute intra-abdominal emergency.  COVID-19 PCR testing was collected and resulted positive for COVID-19.  Additional labs significant for lactic acid normal, CBC without a leukocytosis, TSH normal, hemoglobin at baseline at 8.7. CMP with mild hypokalemia to 3.4, serum creatinine postdialysis at 3.68.  CXR: IMPRESSION:  Patchy airspace disease at the right lung base, suspicious for  pneumonia.    The patient was covered for CAP with Rocephin  and Azithromycin .   CT Abd Pel: IMPRESSION:  1. No acute findings.  2. Left colonic diverticulosis, without  evidence of diverticulitis.  3. 4.7 cm right adnexal cyst, progressive. Given the patient's age, annual  follow-up ultrasound is considered optional.    PORT score indicates higher risk, hospitalization recommended. Hospitalist medicine consulted for admission, Dr. Sundil accepting in admission.  Final Clinical Impression(s) / ED Diagnoses Final diagnoses:  COVID-19  Community acquired pneumonia, unspecified laterality    Rx / DC Orders ED Discharge Orders     None         Rosealee Concha, MD 12/04/23 2311    Rosealee Concha, MD 12/16/23 0945

## 2023-12-04 NOTE — ED Notes (Signed)
 Pt arrived back from CT with increased work of breathing, oxygen  sats at 94%, placed back on 2L oxygen . MD made aware

## 2023-12-04 NOTE — ED Triage Notes (Signed)
 Arrives [facility] from home with main complaint of loss of appetite, generalized not feeling well, diarrhea, and nasal congestion x 3 days.   Says she had a previous bowel obstruction that presented very similiarly.   Dialysis today (M, W, F). Left arm restriction.

## 2023-12-04 NOTE — ED Notes (Signed)
Chicken broth provided

## 2023-12-04 NOTE — ED Notes (Signed)
 Provider at bedside

## 2023-12-04 NOTE — ED Notes (Signed)
 Oxygen  turned off ; pt reported previously that she wear it intermittently as needed at home.

## 2023-12-04 NOTE — H&P (Signed)
 History and Physical    Mallory Chavez ZOX:[id number] DOB: Nov 25, 1935 DOA: 12/04/2023  PCP: Rosslyn Coons, MD   Patient coming from: Home   Chief Complaint:  Chief Complaint  Patient presents with   Diarrhea   ED TRIAGE note: Arrives [facility] from home with main complaint of loss of appetite, generalized not feeling well, diarrhea, and nasal congestion x 3 days.    Says she had a previous bowel obstruction that presented very similiarly.    Dialysis today (M, W, F). Left arm restriction.             HPI:  Mallory Chavez is a 88 y.o. female with medical history significant of ESRD on dialysis MWF schedule, chronic gout of the left foot, COPD, nocturnal hypoxia on oxygen , chronic allergic rhinitis, heart failure with preserved ejection fraction, parathyroidectomy with autotransplant to right forearm, chronic hypocalcemia managing by nephrology and essential hypertension presented to emergency department multiple complaint include generalized malaise, loss of appetite, fatigue, diarrhea and nasal congestion for 3 days.  Patient reported when she has bowel movement in the past has similar symptoms.  Patient reported she underwent dialysis today. Patient denies any fever and chills.  Patient reported 2 episodes of loose stool in last 24 hours.  Patient denies any nausea, vomiting and abdominal pain.  No other complaint at this time.   ED Course: At presentation to ED patient is hemodynamically stable. CBC showing WBC count 5.6, stable H&H 8.7 and normal platelet count. Pending CMP.  Normal TSH level and pending T4 level. Respiratory panel positive with COVID-19 PCR. Normal lactic acid level.  CT abdomen pelvis no acute finding.  Left colonic diverticulosis without any diverticulitis. 4.7 cm right adnexal cyst, progressive. Given the patient's age, annual follow-up ultrasound is considered optional.  Chest x-ray showing patchy airspace disease of the right lung base suspicion  for pneumonia.  In the ED patient has been given ceftriaxone 1 g and azithromycin  500 mg.  Hospitalist has been consulted for management of pneumonia and COVID-19 infection.  Significant labs in the ED: Lab Orders         Resp panel by RT-PCR (RSV, Flu A&B, Covid) Anterior Nasal Swab         Expectorated Sputum Assessment w Gram Stain, Rflx to Resp Cult         Culture, blood (routine x 2) Call MD if unable to obtain prior to antibiotics being given         TSH         T4, free         CBC with Differential         Comprehensive metabolic panel         Legionella Pneumophila Serogp 1 Ur Ag         Strep pneumoniae urinary antigen         CBC         Comprehensive metabolic panel         Procalcitonin       Review of Systems:  Review of Systems  Constitutional:  Negative for chills, fever, malaise/fatigue and weight loss.  Respiratory:  Positive for cough and sputum production. Negative for shortness of breath and wheezing.   Cardiovascular:  Negative for chest pain, palpitations and leg swelling.  Gastrointestinal:  Negative for abdominal pain, diarrhea, heartburn, nausea and vomiting.  Genitourinary:  Negative for dysuria.  Musculoskeletal:  Negative for back pain, joint pain, myalgias and neck pain.  Neurological:  Negative for dizziness and headaches.  Psychiatric/Behavioral:  The patient is not nervous/anxious.     Past Medical History:  Diagnosis Date   Allergy    Arthritis    Asthma    CHF (congestive heart failure) (HCC) 1980   controlled with meds   Chronic respiratory failure (HCC)    COPD (chronic obstructive pulmonary disease) (HCC)    ESRD on hemodialysis (HCC)    MWF at [facility]   GERD (gastroesophageal reflux disease)    Hyperglycemia    Hyperlipidemia    Hypertension    Hyponatremia     Past Surgical History:  Procedure Laterality Date   A/V FISTULAGRAM N/A 09/03/2023   Procedure: A/V Fistulagram;  Surgeon: Patrick Boor, MD;  Location: [facility];  Service: Cardiovascular;  Laterality: N/A;   AV FISTULA PLACEMENT Left 07/23/2018   Procedure: ARTERIOVENOUS (AV) FISTULA CREATION;  Surgeon: Adine Hoof, MD;  Location: [facility] OR;  Service: Vascular;  Laterality: Left;   CATARACT EXTRACTION Bilateral    2024   ECTOPIC PREGNANCY SURGERY  1962   INSERTION OF DIALYSIS CATHETER Right 07/23/2018   Procedure: INSERTION OF DIALYSIS CATHETER;  Surgeon: Adine Hoof, MD;  Location: [facility] OR;  Service: Vascular;  Laterality: Right;   INSERTION OF DIALYSIS CATHETER Right 01/24/2023   Procedure: ULTRASOUND GUIDED INSERTION OF DIALYSIS CATHETER;  Surgeon: Carlene Che, MD;  Location: [facility] OR;  Service: Vascular;  Laterality: Right;   PARATHYROID  EXPLORATION N/A 01/25/2022   Procedure: NECK EXPLORATION WITH PARATHYROIDECTOMY;  Surgeon: Oralee Billow, MD;  Location: [facility] OR;  Service: General;  Laterality: N/A;   PERIPHERAL VASCULAR BALLOON ANGIOPLASTY Left 09/03/2023   Procedure: PERIPHERAL VASCULAR BALLOON ANGIOPLASTY;  Surgeon: Patrick Boor, MD;  Location: [facility];  Service: Cardiovascular;  Laterality: Left;  cephalic arch   REVISON OF ARTERIOVENOUS FISTULA Left 01/24/2023   Procedure: REVISON OF  LEFT ARM ARTERIOVENOUS FISTULA;  Surgeon: Carlene Che, MD;  Location: [facility] OR;  Service: Vascular;  Laterality: Left;   TUMOR REMOVAL     lower back/benign and has come back   VAGINAL HYSTERECTOMY       reports that she quit smoking about 31 years ago. Her smoking use included cigarettes. She started smoking about 51 years ago. She has a 30 pack-year smoking history. She has never used smokeless tobacco. She reports that she does not drink alcohol  and does not use drugs.  No Known Allergies  Family History  Problem Relation Age of Onset   Diabetes Mother    Emphysema Father     Prior to Admission medications   Medication Sig Start Date End Date Taking? Authorizing Provider  acetaminophen  (TYLENOL ) 500 MG tablet  Take 1,000 mg by mouth every 6 (six) hours as needed for mild pain (pain score 1-3) or moderate pain (pain score 4-6).    [provider]  albuterol  (PROAIR  HFA) 108 (90 Base) MCG/ACT inhaler Inhale 2 puffs into the lungs every 4 hours as needed only  if your can't catch your breath 11/14/23   Diamond Formica, MD  aspirin  EC 81 MG tablet Take 81 mg by mouth daily after supper.    [provider]  B Complex-C-Zn-Folic Acid  (DIALYVITE  800 WITH ZINC ) 0.8 MG TABS Take 1 tablet by mouth at bedtime 02/04/23     budesonide -formoterol  (SYMBICORT ) 160-4.5 MCG/ACT inhaler Inhale 2 puffs into the lungs 2 (two) times daily. 08/13/23   Diamond Formica, MD  calcium  carbonate (CAL-GEST ANTACID)  500 MG chewable tablet Chew and swallow 3 tablets (600 mg of elemental calcium  total) by mouth daily. (DO NOT TAKE WITH MEALS) 09/21/22     Cholecalciferol (VITAMIN D3 PO) Take 1 capsule by mouth every Monday, Wednesday, and Friday.    [provider]  colchicine  0.6 MG tablet Take 0.6 mg by mouth daily as needed (Gout).    [provider]  colchicine  0.6 MG tablet Take 1 tablet (0.6 mg total) by mouth 2 (two) times daily. 10/22/23     Glycerin -Hypromellose-PEG 400 (DRY EYE RELIEF DROPS) 0.2-0.2-1 % SOLN Place 1 drop into both eyes daily as needed (Dry eyes).    [provider]  lidocaine -prilocaine  (EMLA ) cream Apply small amount to skin /to AVF- prior to dialysis 04/22/23     loratadine  (CLARITIN ) 10 MG tablet Take 10 mg by mouth daily after supper.    [provider]  midodrine  (PROAMATINE ) 10 MG tablet Take 1 tablet by mouth as directed. Take one tablet 30 mins before each hemodialysis every Mon, Wed, Fri. May take a 2nd dose while on dialysis for SBP<100 02/13/23     OXYGEN  Inhale 2 L into the lungs daily as needed (with sleep and exertion as needed [facility]).    [provider]  calcium  carbonate (OS-CAL - DOSED IN MG OF ELEMENTAL CALCIUM ) 1250 (500 Ca) MG tablet Take 4  tablets (5,000 mg total) by mouth 3 (three) times daily between meals. 02/11/22 02/16/22  Barbee Lew, MD     Physical Exam: Vitals:   12/04/23 2001 12/04/23 2015 12/04/23 2200 12/04/23 2224  BP: (!) 120/58 (!) 119/49    Pulse: 76 70 79   Resp: 18 12 11    Temp:      TempSrc:      SpO2: 100% 100% 100% 98%  Weight:      Height:        Physical Exam Vitals and nursing note reviewed.  Constitutional:      Appearance: Normal appearance.  HENT:     Mouth/Throat:     Mouth: Mucous membranes are moist.  Eyes:     Pupils: Pupils are equal, round, and reactive to light.  Cardiovascular:     Rate and Rhythm: Normal rate and regular rhythm.     Pulses: Normal pulses.     Heart sounds: Normal heart sounds.  Pulmonary:     Effort: Pulmonary effort is normal.     Breath sounds: Normal breath sounds.  Abdominal:     General: Bowel sounds are normal.     Palpations: Abdomen is soft.  Musculoskeletal:     Cervical back: Neck supple.     Right lower leg: No edema.     Left lower leg: No edema.  Skin:    General: Skin is warm.     Capillary Refill: Capillary refill takes less than 2 seconds.  Neurological:     Mental Status: She is alert and oriented to person, place, and time.  Psychiatric:        Mood and Affect: Mood normal.      Labs on Admission: I have personally reviewed following labs and imaging studies  CBC: Recent Labs  Lab 12/04/23 2026  WBC 5.6  NEUTROABS 3.8  HGB 8.7*  HCT 27.8*  MCV 105.3*  PLT 168   Basic Metabolic Panel: Recent Labs  Lab 12/04/23 2026  NA 141  K 3.4*  CL 93*  CO2 29  GLUCOSE 105*  BUN 12  CREATININE 3.68*  CALCIUM  7.3*   GFR: Estimated Creatinine Clearance: 10.7 mL/min (A) (by C-G formula based on SCr of 3.68 mg/dL (H)). Liver Function Tests: Recent Labs  Lab 12/04/23 2026  AST 33  ALT 17  ALKPHOS 29*  BILITOT 0.7  PROT 7.3  ALBUMIN 3.4*   No results for input(s): "LIPASE", "AMYLASE" in the last 168  hours. No results for input(s): "AMMONIA" in the last 168 hours. Coagulation Profile: No results for input(s): "INR", "PROTIME" in the last 168 hours. Cardiac Enzymes: No results for input(s): "CKTOTAL", "CKMB", "CKMBINDEX", "TROPONINI", "TROPONINIHS" in the last 168 hours. BNP (last 3 results) No results for input(s): "BNP" in the last 8760 hours. HbA1C: No results for input(s): "HGBA1C" in the last 72 hours. CBG: No results for input(s): "GLUCAP" in the last 168 hours. Lipid Profile: No results for input(s): "CHOL", "HDL", "LDLCALC", "TRIG", "CHOLHDL", "LDLDIRECT" in the last 72 hours. Thyroid  Function Tests: Recent Labs    12/04/23 2026 12/04/23 2027  TSH  --  1.475  FREET4 0.71  --    Anemia Panel: No results for input(s): "VITAMINB12", "FOLATE", "FERRITIN", "TIBC", "IRON", "RETICCTPCT" in the last 72 hours. Urine analysis:    Component Value Date/Time   COLORURINE YELLOW 07/02/2016 1058   APPEARANCEUR CLEAR 07/02/2016 1058   LABSPEC 1.010 07/02/2016 1058   PHURINE 6.0 07/02/2016 1058   GLUCOSEU NEGATIVE 07/02/2016 1058   HGBUR NEGATIVE 07/02/2016 1058   BILIRUBINUR NEGATIVE 07/02/2016 1058   KETONESUR NEGATIVE 07/02/2016 1058   PROTEINUR 30 (A) 07/02/2016 1058   NITRITE NEGATIVE 07/02/2016 1058   LEUKOCYTESUR SMALL (A) 07/02/2016 1058    Radiological Exams on Admission: I have personally reviewed images CT ABDOMEN PELVIS WO CONTRAST Result Date: 12/04/2023 EXAM: CT ABDOMEN AND PELVIS WITHOUT CONTRAST 12/04/2023 10:15:05 PM TECHNIQUE: CT of the abdomen and pelvis was performed without the administration of intravenous contrast. Multiplanar reformatted images are provided for review. Automated exposure control, iterative reconstruction, and/or weight based adjustment of the mA/kV was utilized to reduce the radiation dose to as low as reasonably achievable. COMPARISON: 02/08/2022 CLINICAL HISTORY: Abdominal pain, acute, nonlocalized. Arrives [facility] from home with main  complaint of loss of appetite, generalized not feeling well, diarrhea, and nasal congestion x 3 days. Says she had a previous bowel obstruction that presented very similarly. FINDINGS: LOWER CHEST: Subpleural scarring in the bilateral lower lobes. LIVER: The liver is unremarkable. GALLBLADDER AND BILE DUCTS: Gallbladder is unremarkable. No biliary ductal dilatation. SPLEEN: No acute abnormality. PANCREAS: No acute abnormality. ADRENAL GLANDS: No acute abnormality. KIDNEYS, URETERS AND BLADDER: Scattered bilateral renal cysts, including a 4.6 cm simple cyst in the right lower kidney (image 31) and a 2.0 cm probable hemorrhagic cyst inferiorly in the left kidney (image 31), poorly evaluated on the current study, likely benign (Bosniak 1 to 2). Given the patient's age, no follow up is recommended. No stones in the kidneys or ureters. No hydronephrosis. No perinephric or periureteral stranding. Urinary bladder is unremarkable. GI AND BOWEL: Normal appendix (image 48). Left colonic diverticulosis, without evidence of diverticulitis. Stomach demonstrates no acute abnormality. There is no bowel obstruction. PERITONEUM AND RETROPERITONEUM: No ascites. No free air. VASCULATURE: Atherosclerotic calcifications of the abdominal aorta and branch vessels. LYMPH NODES: No lymphadenopathy. REPRODUCTIVE ORGANS: Status post hysterectomy. 4.7 cm right adnexal cyst (image 62), previously 2.8 cm. Given the patient's age, annual follow-up ultrasound is considered optional. BONES AND SOFT TISSUES: No acute osseous abnormality. No focal soft tissue abnormality. IMPRESSION: 1. No acute findings. 2. Left colonic diverticulosis, without evidence  of diverticulitis. 3. 4.7 cm right adnexal cyst, progressive. Given the patient's age, annual follow-up ultrasound is considered optional. Electronically signed by: Zadie Herter MD 12/04/2023 10:30 PM EDT RP Workstation: [id number]   DG Chest Portable 1 View Result Date: 12/04/2023 CLINICAL  DATA:  Cough, COVID. EXAM: PORTABLE CHEST 1 VIEW COMPARISON:  Radiograph 01/24/2023 FINDINGS: Patchy airspace disease at the right lung base. Streaky left lung base opacities favor scarring. Stable heart size and mediastinal contours. Aortic atherosclerosis. Left axillary vascular stent. No pulmonary edema. No pneumothorax or pleural effusion. IMPRESSION: Patchy airspace disease at the right lung base, suspicious for pneumonia. Electronically Signed   By: Chadwick Colonel M.D.   On: 12/04/2023 22:18     EKG: Pending EKG    Assessment/Plan: Principal Problem:   CAP (community acquired pneumonia) Active Problems:   ESRD (end stage renal disease) (HCC)   Hypocalcemia due to chronic kidney disease   COVID-19 virus infection   History of gout   History of COPD   Nocturnal hypoxia   Chronic systolic CHF (congestive heart failure) (HCC)   History of parathyroidectomy    Assessment and Plan: Community acquired pneumonia COVID-19 infection -Patient presenting to emergency department complaining of generalized weakness, diarrhea for 3 days, shortness of breath and nonproductive cough.  Patient was under impression that she has another episode of a small bowel obstruction as her symptoms are similar like before. - At presentation to ED hemodynamically stable - CT abdomen pelvis no acute finding, left colonic diverticulosis without any evidence of diverticulitis. -Chest x-ray showing evidence of right lower lobe infiltrate suspicion for pneumonia. - Respiratory panel positive with COVID-19. -CBC unremarkable normal WBC count, stable H&H normal platelet count.  Pending CMP level.  Lactic acid within normal range. - Given chest x-ray is not showing any evidence of multifocal pneumonia not concern for pneumonia secondary to COVID-19 infection without then bacterial pneumonia. - In the ED.  With IV ceftriaxone and azithromycin  - Starting IV ceftriaxone with pharmacy consult for dose adjustment as  dialysis patient and continue IV doxycycline 100 mg twice daily. - Pending blood culture, sputum culture, urine Legionella, urine strep and procalcitonin level. -Need to follow-up with culture result for appropriate antibiotic guidance    ESRD on dialysis MWF schedule -Patient is involved in physical exam.  Has dialysis today 6/4. -Need to consult nephrology if it patient stays in the hospital until Friday. -Continue  midodrine  3 times weekly before dialysis.  History of COPD History of nocturnal hypoxia on 2 L oxygen  -Continue check pulse ox and supplemental oxygen  2 L to keep O2 sat above 92%.  Continue supplemental nighttime oxygen  given history of nocturnal hypoxia - Continue Dulera  twice daily and albuterol  as needed - Continue Mucinex and supportive care.  Grade 1 diastolic heart failure with preserved EF 50% - At home patient is not any blood pressure regimen. -Blood pressure within good range now.   History of parathyroidectomy and hypocalcemia - Continue Tums 600 mg daily    DVT prophylaxis:  SQ Heparin  Code Status:  Full Code Diet: Renal diet Family Communication:   Family was present at bedside, at the time of interview. Opportunity was given to ask question and all questions were answered satisfactorily.  Disposition Plan: Pending culture results. Admission status:   Observation, Telemetry bed  Severity of Illness: The appropriate patient status for this patient is OBSERVATION. Observation status is judged to be reasonable and necessary in order to provide the required intensity of service to ensure the  patient's safety. The patient's presenting symptoms, physical exam findings, and initial radiographic and laboratory data in the context of their medical condition is felt to place them at decreased risk for further clinical deterioration. Furthermore, it is anticipated that the patient will be medically stable for discharge from the hospital within 2 midnights of  admission.     Tiya Schrupp, MD [facility]  How to contact the [facility] Attending or Consulting provider 7A - 7P or covering provider during after hours 7P -7A, for this patient.  Check the care team in [facility] and look for a) attending/consulting [facility] provider listed and b) the [facility] team listed Log into [email or web address] and use [place]'s universal password to access. If you do not have the password, please contact the hospital operator. Locate the [facility] provider you are looking for under [facility] and page to a number that you can be directly reached. If you still have difficulty reaching the provider, please page the [facility] (Director on Call) for the Hospitalists listed on amion for assistance.  12/04/2023, 11:07 PM

## 2023-12-04 NOTE — ED Notes (Signed)
IV attempted x2 without success.

## 2023-12-05 DIAGNOSIS — J189 Pneumonia, unspecified organism: Secondary | ICD-10-CM | POA: Diagnosis not present

## 2023-12-05 LAB — COMPREHENSIVE METABOLIC PANEL WITH GFR
ALT: 17 U/L (ref 0–44)
AST: 31 U/L (ref 15–41)
Albumin: 3 g/dL — ABNORMAL LOW (ref 3.5–5.0)
Alkaline Phosphatase: 30 U/L — ABNORMAL LOW (ref 38–126)
Anion gap: 15 (ref 5–15)
BUN: 14 mg/dL (ref 8–23)
CO2: 28 mmol/L (ref 22–32)
Calcium: 6.6 mg/dL — ABNORMAL LOW (ref 8.9–10.3)
Chloride: 95 mmol/L — ABNORMAL LOW (ref 98–111)
Creatinine, Ser: 4.33 mg/dL — ABNORMAL HIGH (ref 0.44–1.00)
GFR, Estimated: 10 mL/min — ABNORMAL LOW (ref >60)
Glucose, Bld: 90 mg/dL (ref 70–99)
Potassium: 3.4 mmol/L — ABNORMAL LOW (ref 3.5–5.1)
Sodium: 138 mmol/L (ref 135–145)
Total Bilirubin: 0.7 mg/dL (ref 0.0–1.2)
Total Protein: 6.8 g/dL (ref 6.5–8.1)

## 2023-12-05 LAB — CBC
HCT: 26.2 % — ABNORMAL LOW (ref 36.0–46.0)
Hemoglobin: 8.4 g/dL — ABNORMAL LOW (ref 12.0–15.0)
MCH: 34.4 pg — ABNORMAL HIGH (ref 26.0–34.0)
MCHC: 32.1 g/dL (ref 30.0–36.0)
MCV: 107.4 fL — ABNORMAL HIGH (ref 80.0–100.0)
Platelets: 162 10*3/uL (ref 150–400)
RBC: 2.44 MIL/uL — ABNORMAL LOW (ref 3.87–5.11)
RDW: 15.8 % — ABNORMAL HIGH (ref 11.5–15.5)
WBC: 5.3 10*3/uL (ref 4.0–10.5)
nRBC: 0 % (ref 0.0–0.2)

## 2023-12-05 LAB — MRSA NEXT GEN BY PCR, NASAL: MRSA by PCR Next Gen: NOT DETECTED

## 2023-12-05 LAB — HEPATITIS B SURFACE ANTIGEN: Hepatitis B Surface Ag: NONREACTIVE

## 2023-12-05 LAB — PROCALCITONIN: Procalcitonin: 1.55 ng/mL

## 2023-12-05 MED ORDER — HEPARIN SODIUM (PORCINE) 1000 UNIT/ML DIALYSIS
1500.0000 [IU] | INTRAMUSCULAR | Status: AC | PRN
  Administered 2023-12-06: 1500 [IU] via INTRAVENOUS_CENTRAL

## 2023-12-05 MED ORDER — LIDOCAINE HCL (PF) 1 % IJ SOLN: 5.0000 mL | INTRAMUSCULAR | Status: DC | PRN

## 2023-12-05 MED ORDER — CHLORHEXIDINE GLUCONATE CLOTH 2 % EX PADS
6.0000 | MEDICATED_PAD | Freq: Every day | CUTANEOUS | Status: DC
  Administered 2023-12-06 – 2023-12-07 (×2): 6 via TOPICAL

## 2023-12-05 MED ORDER — HEPARIN SODIUM (PORCINE) 1000 UNIT/ML DIALYSIS: 1000.0000 [IU] | INTRAMUSCULAR | Status: DC | PRN

## 2023-12-05 MED ORDER — NEPRO/CARBSTEADY PO LIQD: 237.0000 mL | ORAL | Status: DC | PRN

## 2023-12-05 MED ORDER — PENTAFLUOROPROP-TETRAFLUOROETH EX AERO: 1.0000 | INHALATION_SPRAY | CUTANEOUS | Status: DC | PRN

## 2023-12-05 MED ORDER — ALTEPLASE 2 MG IJ SOLR
2.0000 mg | Freq: Once | INTRAMUSCULAR | Status: DC | PRN
Start: 2023-12-05 — End: 2023-12-07

## 2023-12-05 MED ORDER — LIDOCAINE-PRILOCAINE 2.5-2.5 % EX CREA: 1.0000 | TOPICAL_CREAM | CUTANEOUS | Status: DC | PRN

## 2023-12-05 MED ORDER — HEPARIN SODIUM (PORCINE) 1000 UNIT/ML DIALYSIS
3000.0000 [IU] | Freq: Once | INTRAMUSCULAR | Status: AC
  Administered 2023-12-06: 3000 [IU] via INTRAVENOUS_CENTRAL

## 2023-12-05 MED ORDER — ANTICOAGULANT SODIUM CITRATE 4% (200MG/5ML) IV SOLN
5.0000 mL | Status: DC | PRN
Start: 2023-12-05 — End: 2023-12-07

## 2023-12-05 NOTE — Progress Notes (Signed)
 PROGRESS NOTE  ALAYZHA AN VHQ:[id number] DOB: [birth year] DOA: 12/04/2023 PCP: Rosslyn Coons, MD   LOS: 0 days   Brief narrative:  Mallory Chavez is a 88 y.o. female with past medical history significant of ESRD on dialysis MWF schedule, chronic gout of the left foot, COPD, nocturnal hypoxia on oxygen , chronic allergic rhinitis, heart failure with preserved ejection fraction, history of parathyroidectomy with autotransplant to right forearm, chronic hypocalcemia managed by nephrology and essential hypertension presented to the hospital with generalized malaise lack of appetite fatigue weakness diarrhea nasal congestion and cough for last 3 days.  Patient reported that she had gone to dialysis on the day of presentation.  Had few loose stools as well.  In the ED patient had no leukocytosis.  Hemoglobin of 8.7.  Lactate of 1.0 patient tested positive for COVID-19 PCR.  BMP was notable for mild hypokalemia with potassium of 3.4.  Creatinine was elevated at 3.6.  Chest x-ray showed patchy airspace opacities in the right lung suspicious for pneumonia.  CT scan of the abdomen pelvis showed colonic diverticulosis but no acute findings.  Patient was then considered for admission to hospital for further evaluation and treatment.      Assessment/Plan: Principal Problem:   CAP (community acquired pneumonia) Active Problems:   ESRD (end stage renal disease) (HCC)   Hypocalcemia due to chronic kidney disease   COVID-19 virus infection   History of gout   History of COPD   Nocturnal hypoxia   Chronic systolic CHF (congestive heart failure) (HCC)   History of parathyroidectomy   Community acquired pneumonia COVID-19 infection Chest x-ray with right-sided pneumonia.  On Rocephin and doxycycline.  Patient does not make urine.  Continue sputum culture blood culture.  Pending MRSA PCR and procalcitonin.    ESRD on dialysis MWF schedule On midodrine .  Will consult nephrology for dialysis  tomorrow.  Communicated with Dr. Elnita Hai about it.   History of COPD on nocturnal hypoxia on 2 L oxygen  Continue Dulera  albuterol  Mucinex and supportive care.   Grade 1 diastolic heart failure with preserved EF 50% Volume managed with hemodialysis.    History of parathyroidectomy and hypocalcemia Continue Tums.  Debility weakness.  Will get PT OT evaluation.  DVT prophylaxis: heparin  injection 5,000 Units Start: 12/04/23 2300 SCDs Start: 12/04/23 2251 Place TED hose Start: 12/04/23 2251   Disposition: Home with home health in 1 to 2 days  Status is: Observation The patient will require care spanning > 2 midnights and should be moved to inpatient because: Pneumonia, IV antibiotic, pending clinical improvement.    Code Status:     Code Status: Full Code  Family Communication: None at bedside  Consultants: Nephrology  Procedures: None yet  Anti-infectives:  Rocephin and doxycycline  Anti-infectives (From admission, onward)    Start     Dose/Rate Route Frequency Ordered Stop   12/05/23 1000  cefTRIAXone (ROCEPHIN) 1 g in sodium chloride  0.9 % 100 mL IVPB        1 g 200 mL/hr over 30 Minutes Intravenous Every 24 hours 12/04/23 2253 12/11/23 0959   12/05/23 0000  doxycycline (VIBRAMYCIN) 100 mg in sodium chloride  0.9 % 250 mL IVPB        100 mg 125 mL/hr over 120 Minutes Intravenous Every 12 hours 12/04/23 2250 12/11/23 2359   12/04/23 2230  cefTRIAXone (ROCEPHIN) 1 g in sodium chloride  0.9 % 100 mL IVPB        1 g 200 mL/hr over 30 Minutes Intravenous  Once 12/04/23 2224 12/04/23 2326   12/04/23 2230  azithromycin  (ZITHROMAX ) 500 mg in sodium chloride  0.9 % 250 mL IVPB        500 mg 250 mL/hr over 60 Minutes Intravenous  Once 12/04/23 2224 12/05/23 0031       Subjective: Today, patient was seen and examined at bedside.  Patient states that she feels little better with breathing.  Still has some cough fatigue weakness.  Objective: Vitals:   12/05/23 0940  12/05/23 1204  BP: 120/63 (!) 98/45  Pulse: 73 66  Resp: 17   Temp: 98.7 F (37.1 C) 98 F (36.7 C)  SpO2: 100% 100%    Intake/Output Summary (Last 24 hours) at 12/05/2023 1237 Last data filed at 12/05/2023 0245 Gross per 24 hour  Intake 495.75 ml  Output --  Net 495.75 ml   Filed Weights   12/04/23 2000  Weight: 69.9 kg   Body mass index is 26.43 kg/m.   Physical Exam:  GENERAL: Patient is alert awake and oriented. Not in obvious distress.  On nasal cannula oxygen , elderly female, appears weak and deconditioned HENT: No scleral pallor or icterus. Pupils equally reactive to light. Oral mucosa is moist NECK: is supple, no gross swelling noted. CHEST: Decreased breath sounds bilaterally. CVS: S1 and S2 heard, no murmur. Regular rate and rhythm.  ABDOMEN: Soft, non-tender, bowel sounds are present. EXTREMITIES: No edema. CNS: Cranial nerves are intact. No focal motor deficits. SKIN: warm and dry without rashes.  Data Review: I have personally reviewed the following laboratory data and studies,  CBC: Recent Labs  Lab 12/04/23 2026 12/05/23 0413  WBC 5.6 5.3  NEUTROABS 3.8  --   HGB 8.7* 8.4*  HCT 27.8* 26.2*  MCV 105.3* 107.4*  PLT 168 162   Basic Metabolic Panel: Recent Labs  Lab 12/04/23 2026 12/05/23 0413  NA 141 138  K 3.4* 3.4*  CL 93* 95*  CO2 29 28  GLUCOSE 105* 90  BUN 12 14  CREATININE 3.68* 4.33*  CALCIUM  7.3* 6.6*   Liver Function Tests: Recent Labs  Lab 12/04/23 2026 12/05/23 0413  AST 33 31  ALT 17 17  ALKPHOS 29* 30*  BILITOT 0.7 0.7  PROT 7.3 6.8  ALBUMIN 3.4* 3.0*   No results for input(s): "LIPASE", "AMYLASE" in the last 168 hours. No results for input(s): "AMMONIA" in the last 168 hours. Cardiac Enzymes: No results for input(s): "CKTOTAL", "CKMB", "CKMBINDEX", "TROPONINI" in the last 168 hours. BNP (last 3 results) No results for input(s): "BNP" in the last 8760 hours.  ProBNP (last 3 results) No results for input(s):  "PROBNP" in the last 8760 hours.  CBG: No results for input(s): "GLUCAP" in the last 168 hours. Recent Results (from the past 240 hours)  Resp panel by RT-PCR (RSV, Flu A&B, Covid) Anterior Nasal Swab     Status: Abnormal   Collection Time: 12/04/23  8:27 PM   Specimen: Anterior Nasal Swab  Result Value Ref Range Status   SARS Coronavirus 2 by RT PCR POSITIVE (A) NEGATIVE Final   Influenza A by PCR NEGATIVE NEGATIVE Final   Influenza B by PCR NEGATIVE NEGATIVE Final    Comment: (NOTE) The Xpert Xpress SARS-CoV-2/FLU/RSV plus assay is intended as an aid in the diagnosis of influenza from Nasopharyngeal swab specimens and should not be used as a sole basis for treatment. Nasal washings and aspirates are unacceptable for Xpert Xpress SARS-CoV-2/FLU/RSV testing.  Fact Sheet for Patients: BloggerCourse.com  Fact Sheet for Healthcare Providers:  SeriousBroker.it  This test is not yet approved or cleared by the [place]  FDA and has been authorized for detection and/or diagnosis of SARS-CoV-2 by FDA under an Emergency Use Authorization (EUA). This EUA will remain in effect (meaning this test can be used) for the duration of the COVID-19 declaration under Section 564(b)(1) of the Act, 21 U.S.C. section 360bbb-3(b)(1), unless the authorization is terminated or revoked.     Resp Syncytial Virus by PCR NEGATIVE NEGATIVE Final    Comment: (NOTE) Fact Sheet for Patients: BloggerCourse.com  Fact Sheet for Healthcare Providers: SeriousBroker.it  This test is not yet approved or cleared by the [place]  FDA and has been authorized for detection and/or diagnosis of SARS-CoV-2 by FDA under an Emergency Use Authorization (EUA). This EUA will remain in effect (meaning this test can be used) for the duration of the COVID-19 declaration under Section 564(b)(1) of the Act, 21  U.S.C. section 360bbb-3(b)(1), unless the authorization is terminated or revoked.  Performed at [facility], [address] [place], [place] [address]      Studies: CT ABDOMEN PELVIS WO CONTRAST Result Date: 12/04/2023 EXAM: CT ABDOMEN AND PELVIS WITHOUT CONTRAST 12/04/2023 10:15:05 PM TECHNIQUE: CT of the abdomen and pelvis was performed without the administration of intravenous contrast. Multiplanar reformatted images are provided for review. Automated exposure control, iterative reconstruction, and/or weight based adjustment of the mA/kV was utilized to reduce the radiation dose to as low as reasonably achievable. COMPARISON: 02/08/2022 CLINICAL HISTORY: Abdominal pain, acute, nonlocalized. Arrives [facility] from home with main complaint of loss of appetite, generalized not feeling well, diarrhea, and nasal congestion x 3 days. Says she had a previous bowel obstruction that presented very similarly. FINDINGS: LOWER CHEST: Subpleural scarring in the bilateral lower lobes. LIVER: The liver is unremarkable. GALLBLADDER AND BILE DUCTS: Gallbladder is unremarkable. No biliary ductal dilatation. SPLEEN: No acute abnormality. PANCREAS: No acute abnormality. ADRENAL GLANDS: No acute abnormality. KIDNEYS, URETERS AND BLADDER: Scattered bilateral renal cysts, including a 4.6 cm simple cyst in the right lower kidney (image 31) and a 2.0 cm probable hemorrhagic cyst inferiorly in the left kidney (image 31), poorly evaluated on the current study, likely benign (Bosniak 1 to 2). Given the patient's age, no follow up is recommended. No stones in the kidneys or ureters. No hydronephrosis. No perinephric or periureteral stranding. Urinary bladder is unremarkable. GI AND BOWEL: Normal appendix (image 48). Left colonic diverticulosis, without evidence of diverticulitis. Stomach demonstrates no acute abnormality. There is no bowel obstruction. PERITONEUM AND RETROPERITONEUM: No ascites. No free air. VASCULATURE:  Atherosclerotic calcifications of the abdominal aorta and branch vessels. LYMPH NODES: No lymphadenopathy. REPRODUCTIVE ORGANS: Status post hysterectomy. 4.7 cm right adnexal cyst (image 62), previously 2.8 cm. Given the patient's age, annual follow-up ultrasound is considered optional. BONES AND SOFT TISSUES: No acute osseous abnormality. No focal soft tissue abnormality. IMPRESSION: 1. No acute findings. 2. Left colonic diverticulosis, without evidence of diverticulitis. 3. 4.7 cm right adnexal cyst, progressive. Given the patient's age, annual follow-up ultrasound is considered optional. Electronically signed by: Zadie Herter MD 12/04/2023 10:30 PM EDT RP Workstation: [id number]   DG Chest Portable 1 View Result Date: 12/04/2023 CLINICAL DATA:  Cough, COVID. EXAM: PORTABLE CHEST 1 VIEW COMPARISON:  Radiograph 01/24/2023 FINDINGS: Patchy airspace disease at the right lung base. Streaky left lung base opacities favor scarring. Stable heart size and mediastinal contours. Aortic atherosclerosis. Left axillary vascular stent. No pulmonary edema. No pneumothorax or pleural effusion. IMPRESSION: Patchy airspace disease at the  right lung base, suspicious for pneumonia. Electronically Signed   By: Chadwick Colonel M.D.   On: 12/04/2023 22:18      Rosena Conradi, MD  [facility] 12/05/2023  If 7PM-7AM, please contact night-coverage

## 2023-12-05 NOTE — Care Management Obs Status (Signed)
 MEDICARE OBSERVATION STATUS NOTIFICATION   Patient Details  Name: Mallory Chavez MRN: [id number] Date of Birth: 18-Feb-1936   Medicare Observation Status Notification Given:  Yes Letter Patient has a conttact Precaution order in place called the patient daughter [name] letter explained and a copy sent.   [name] 12/05/2023, 3:29 PM

## 2023-12-05 NOTE — Hospital Course (Signed)
 Mallory Chavez is a 88 y.o. female with past medical history significant of ESRD on dialysis MWF schedule, chronic gout of the left foot, COPD, nocturnal hypoxia on oxygen , chronic allergic rhinitis, heart failure with preserved ejection fraction, history of parathyroidectomy with autotransplant to right forearm, chronic hypocalcemia managed by nephrology and essential hypertension presented to the hospital with generalized malaise lack of appetite fatigue weakness diarrhea nasal congestion and cough for last 3 days.  Patient reported that she had gone to dialysis on the day of presentation.  Had few loose stools as well.  In the ED patient had no leukocytosis.  Hemoglobin of 8.7.  Lactate of 1.0 patient tested positive for COVID-19 PCR.  BMP was notable for mild hypokalemia with potassium of 3.4.  Creatinine was elevated at 3.6.  Chest x-ray showed patchy airspace opacities in the right lung suspicious for pneumonia.  CT scan of the abdomen pelvis showed colonic diverticulosis but no acute findings.  Patient was then considered for admission to hospital for further evaluation and treatment.   Assessment and Plan:  Community acquired pneumonia COVID-19 infection Chest x-ray with right-sided pneumonia.  On Rocephin and doxycycline.  Patient does not make urine.  Continue sputum culture blood culture.  Pending MRSA PCR and procalcitonin.    ESRD on dialysis MWF schedule On midodrine .  Will consult nephrology for dialysis tomorrow.   History of COPD on nocturnal hypoxia on 2 L oxygen  Continue Dulera  albuterol  Mucinex and supportive care.   Grade 1 diastolic heart failure with preserved EF 50% Volume managed with hemodialysis.    History of parathyroidectomy and hypocalcemia Continue Tums.  Debility weakness.  Will get PT OT evaluation.

## 2023-12-05 NOTE — Plan of Care (Signed)
   Problem: Education: Goal: Knowledge of risk factors and measures for prevention of condition will improve Outcome: Progressing   Problem: Coping: Goal: Psychosocial and spiritual needs will be supported Outcome: Progressing   Problem: Respiratory: Goal: Will maintain a patent airway Outcome: Progressing Goal: Complications related to the disease process, condition or treatment will be avoided or minimized Outcome: Progressing   Problem: Education: Goal: Knowledge of General Education information will improve Description: Including pain rating scale, medication(s)/side effects and non-pharmacologic comfort measures Outcome: Progressing   Problem: Health Behavior/Discharge Planning: Goal: Ability to manage health-related needs will improve Outcome: Progressing   Problem: Clinical Measurements: Goal: Ability to maintain clinical measurements within normal limits will improve Outcome: Progressing Goal: Will remain free from infection Outcome: Progressing Goal: Diagnostic test results will improve Outcome: Progressing Goal: Respiratory complications will improve Outcome: Progressing Goal: Cardiovascular complication will be avoided Outcome: Progressing   Problem: Activity: Goal: Risk for activity intolerance will decrease Outcome: Progressing   Problem: Nutrition: Goal: Adequate nutrition will be maintained Outcome: Progressing   Problem: Coping: Goal: Level of anxiety will decrease Outcome: Progressing   Problem: Elimination: Goal: Will not experience complications related to bowel motility Outcome: Progressing Goal: Will not experience complications related to urinary retention Outcome: Progressing   Problem: Pain Managment: Goal: General experience of comfort will improve and/or be controlled Outcome: Progressing   Problem: Safety: Goal: Ability to remain free from injury will improve Outcome: Progressing   Problem: Skin Integrity: Goal: Risk for impaired  skin integrity will decrease Outcome: Progressing   Problem: Activity: Goal: Ability to tolerate increased activity will improve Outcome: Progressing   Problem: Clinical Measurements: Goal: Ability to maintain a body temperature in the normal range will improve Outcome: Progressing   Problem: Respiratory: Goal: Ability to maintain adequate ventilation will improve Outcome: Progressing Goal: Ability to maintain a clear airway will improve Outcome: Progressing

## 2023-12-05 NOTE — Consult Note (Addendum)
 Renal Service Consult Note [facility]  Mallory Chavez 12/05/2023 Lynae Sandifer, MD Requesting Physician: Dr. Efrain Grant  Reason for Consult: ESRD pt w/ pneumonia HPI: The patient is a 88 y.o. year-old w/ PMH as below who presented to ED last night complaining of malaise, loss of appetite, diarrhea and nasal congestion for 3 days.  Does dialysis on MWF schedule.  Has COPD on home oxygen .  In ED blood pressure was 119/49, heart rate 70, RR 12, temp 99.6, 2 L nasal cannula with 100% O2 sats.  K+ 3.4, BUN 12, creatinine 3.6, calcium  7.3, albumin 3.4, lactic acid 1.0, WBC 5K, Hgb 8.7.  Chest x-ray showed patchy right lower lobe infiltrates consistent with pneumonia.  Received IV Zithromax , Rocephin and doxycycline.  Patient was admitted.  We are asked to see for dialysis   Pt seen in hospital room.  Patient is pleasant and has no complaints at this time.  She still feels a little bit weak and some URI symptoms.   ROS - denies CP, no joint pain, no HA, no blurry vision, no rash, no diarrhea, no nausea/ vomiting  PMH: Allergy Arthritis Asthma CHF Chronic respiratory failure on home O2 COPD ESRD on hemodialysis GERD HL HTN  Past Surgical History  Past Surgical History:  Procedure Laterality Date   A/V FISTULAGRAM N/A 09/03/2023   Procedure: A/V Fistulagram;  Surgeon: Patrick Boor, MD;  Location: [facility] IN[facility];  Service: Cardiovascular;  Laterality: N/A;   AV FISTULA PLACEMENT Left 07/23/2018   Procedure: ARTERIOVENOUS (AV) FISTULA CREATION;  Surgeon: Adine Hoof, MD;  Location: [facility] OR;  Service: Vascular;  Laterality: Left;   CATARACT EXTRACTION Bilateral    2024   ECTOPIC PREGNANCY SURGERY  1962   INSERTION OF DIALYSIS CATHETER Right 07/23/2018   Procedure: INSERTION OF DIALYSIS CATHETER;  Surgeon: Adine Hoof, MD;  Location: [facility] OR;  Service: Vascular;  Laterality: Right;   INSERTION OF DIALYSIS CATHETER Right 01/24/2023    Procedure: ULTRASOUND GUIDED INSERTION OF DIALYSIS CATHETER;  Surgeon: Carlene Che, MD;  Location: [facility] OR;  Service: Vascular;  Laterality: Right;   PARATHYROID  EXPLORATION N/A 01/25/2022   Procedure: NECK EXPLORATION WITH PARATHYROIDECTOMY;  Surgeon: Oralee Billow, MD;  Location: [facility] OR;  Service: General;  Laterality: N/A;   PERIPHERAL VASCULAR BALLOON ANGIOPLASTY Left 09/03/2023   Procedure: PERIPHERAL VASCULAR BALLOON ANGIOPLASTY;  Surgeon: Patrick Boor, MD;  Location: [facility];  Service: Cardiovascular;  Laterality: Left;  cephalic arch   REVISON OF ARTERIOVENOUS FISTULA Left 01/24/2023   Procedure: REVISON OF  LEFT ARM ARTERIOVENOUS FISTULA;  Surgeon: Carlene Che, MD;  Location: [facility] OR;  Service: Vascular;  Laterality: Left;   TUMOR REMOVAL     lower back/benign and has come back   VAGINAL HYSTERECTOMY     Family History  Family History  Problem Relation Age of Onset   Diabetes Mother    Emphysema Father    Social History  reports that she quit smoking about 31 years ago. Her smoking use included cigarettes. She started smoking about 51 years ago. She has a 30 pack-year smoking history. She has never used smokeless tobacco. She reports that she does not drink alcohol  and does not use drugs. Allergies No Known Allergies Home medications Prior to Admission medications   Medication Sig Start Date End Date Taking? Authorizing Provider  acetaminophen  (TYLENOL ) 500 MG tablet Take 1,000 mg by mouth every 6 (six) hours as needed for mild pain (pain score  1-3) or moderate pain (pain score 4-6).   Yes [provider]  albuterol  (PROAIR  HFA) 108 (90 Base) MCG/ACT inhaler Inhale 2 puffs into the lungs every 4 hours as needed only  if your can't catch your breath 11/14/23  Yes Diamond Formica, MD  aspirin  EC 81 MG tablet Take 81 mg by mouth daily after supper.   Yes [provider]  B Complex-C-Zn-Folic Acid  (DIALYVITE  800 WITH ZINC ) 0.8 MG TABS Take 1 tablet by  mouth at bedtime 02/04/23  Yes   budesonide -formoterol  (SYMBICORT ) 160-4.5 MCG/ACT inhaler Inhale 2 puffs into the lungs 2 (two) times daily. 08/13/23  Yes Diamond Formica, MD  Cholecalciferol (VITAMIN D3 PO) Take 1 capsule by mouth every Monday, Wednesday, and Friday.   Yes [provider]  colchicine  0.6 MG tablet Take 1 tablet (0.6 mg total) by mouth 2 (two) times daily. Patient taking differently: Take 0.6 mg by mouth See admin instructions. Take 1 tablet by mouth up to three times a day as needed for gout flare up 10/22/23  Yes   Glycerin -Hypromellose-PEG 400 (DRY EYE RELIEF DROPS) 0.2-0.2-1 % SOLN Place 1 drop into both eyes daily as needed (Dry eyes).   Yes [provider]  lidocaine -prilocaine  (EMLA ) cream Apply small amount to skin /to AVF- prior to dialysis 04/22/23  Yes   loratadine  (CLARITIN ) 10 MG tablet Take 10 mg by mouth at bedtime.   Yes [provider]  midodrine  (PROAMATINE ) 10 MG tablet Take 1 tablet by mouth as directed. Take one tablet 30 mins before each hemodialysis every Mon, Wed, Fri. May take a 2nd dose while on dialysis for SBP<100 02/13/23  Yes   OXYGEN  Inhale 2 L into the lungs daily as needed (with sleep and exertion as needed [facility]).   Yes [provider]  calcium  carbonate (CAL-GEST ANTACID) 500 MG chewable tablet Chew and swallow 3 tablets (600 mg of elemental calcium  total) by mouth daily. (DO NOT TAKE WITH MEALS) Patient taking differently: Chew 1,500 mg by mouth every Monday, Wednesday, and Friday. 09/21/22     calcium  carbonate (OS-CAL - DOSED IN MG OF ELEMENTAL CALCIUM ) 1250 (500 Ca) MG tablet Take 4 tablets (5,000 mg total) by mouth 3 (three) times daily between meals. 02/11/22 02/16/22  Barbee Lew, MD     Vitals:   12/05/23 1610 12/05/23 0756 12/05/23 0940 12/05/23 1204  BP: (!) 116/54  120/63 (!) 98/45  Pulse: 70  73 66  Resp: 20  17   Temp:  98.7 F (37.1 C) 98.7 F (37.1 C) 98 F (36.7 C)  TempSrc:  Oral Oral    SpO2: 100%  100% 100%  Weight:      Height:       Exam Gen alert, elderly female in no distress Sclera anicteric, throat clear  No jvd or bruits Chest clear bilat to bases, no rales/ wheezing RRR no MRG Abd soft ntnd no mass or ascites +bs GU deferred MS no joint effusions or deformity Ext no LE or UE edema, no other edema Neuro is alert, Ox 3 , nf    AVF + bruit      Renal-related home meds: Dialyvite  1 daily Midodrine  10 mg pre-HD MWF Home O2 2 L Others: Colchicine , Symbicort , aspirin , albuterol  HFA    OP HD: [facility] MWF 3.5h   B400   68.2kg   AVF   Heparin  4000 Last OP HD 6/04, post wt 68.2 Good attendance, stays on, gets to dry wt Dry wt  lowered recently  IDWG 1- 2.5 kg Mircera 50 mcg q 4 , last 5/14, due 6/11 Vdra 0.5 mcg po three times per week   Assessment/ Plan: PNA/ COVID infection: admitted. IV abx ordered.  ESRD: on HD MWF. HD tomorrow.  BP: is not on BP lowering meds. BP's low-normal, stable. Takes midodrine  pre HD MWF.  Volume: pt appears euvolemic on exam, possibly a bit dry. Follow.  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  Secondary hyperparathyroidism: CCa is low, phos is pending. Cont binders    Larry Poag  MD [facility] 12/05/2023, 2:38 PM  Recent Labs  Lab 12/04/23 2026 12/05/23 0413  HGB 8.7* 8.4*  ALBUMIN 3.4* 3.0*  CALCIUM  7.3* 6.6*  CREATININE 3.68* 4.33*  K 3.4* 3.4*   Inpatient medications:  aspirin  EC  81 mg Oral QPC supper   calcium  carbonate  1,500 mg Oral Daily   guaiFENesin  600 mg Oral BID   heparin   5,000 Units Subcutaneous Q8H   mometasone -formoterol   2 puff Inhalation BID   sodium chloride  flush  3 mL Intravenous Q12H    sodium chloride      cefTRIAXone (ROCEPHIN)  IV Stopped (12/05/23 1010)   doxycycline (VIBRAMYCIN) IV 100 mg (12/05/23 1248)   sodium chloride , acetaminophen  **OR** acetaminophen , albuterol , sodium chloride  flush, trimethobenzamide

## 2023-12-06 DIAGNOSIS — D631 Anemia in chronic kidney disease: Secondary | ICD-10-CM | POA: Diagnosis present

## 2023-12-06 DIAGNOSIS — Z7951 Long term (current) use of inhaled steroids: Secondary | ICD-10-CM | POA: Diagnosis not present

## 2023-12-06 DIAGNOSIS — Z9981 Dependence on supplemental oxygen: Secondary | ICD-10-CM | POA: Diagnosis not present

## 2023-12-06 DIAGNOSIS — E785 Hyperlipidemia, unspecified: Secondary | ICD-10-CM | POA: Diagnosis present

## 2023-12-06 DIAGNOSIS — K219 Gastro-esophageal reflux disease without esophagitis: Secondary | ICD-10-CM | POA: Diagnosis present

## 2023-12-06 DIAGNOSIS — J159 Unspecified bacterial pneumonia: Secondary | ICD-10-CM | POA: Diagnosis present

## 2023-12-06 DIAGNOSIS — J440 Chronic obstructive pulmonary disease with acute lower respiratory infection: Secondary | ICD-10-CM | POA: Diagnosis present

## 2023-12-06 DIAGNOSIS — R197 Diarrhea, unspecified: Secondary | ICD-10-CM | POA: Diagnosis present

## 2023-12-06 DIAGNOSIS — N186 End stage renal disease: Secondary | ICD-10-CM | POA: Diagnosis present

## 2023-12-06 DIAGNOSIS — J189 Pneumonia, unspecified organism: Secondary | ICD-10-CM | POA: Diagnosis present

## 2023-12-06 DIAGNOSIS — Z90710 Acquired absence of both cervix and uterus: Secondary | ICD-10-CM | POA: Diagnosis not present

## 2023-12-06 DIAGNOSIS — K5730 Diverticulosis of large intestine without perforation or abscess without bleeding: Secondary | ICD-10-CM | POA: Diagnosis present

## 2023-12-06 DIAGNOSIS — Z87891 Personal history of nicotine dependence: Secondary | ICD-10-CM | POA: Diagnosis not present

## 2023-12-06 DIAGNOSIS — M1A9XX0 Chronic gout, unspecified, without tophus (tophi): Secondary | ICD-10-CM | POA: Diagnosis present

## 2023-12-06 DIAGNOSIS — E876 Hypokalemia: Secondary | ICD-10-CM | POA: Diagnosis present

## 2023-12-06 DIAGNOSIS — Z992 Dependence on renal dialysis: Secondary | ICD-10-CM | POA: Diagnosis not present

## 2023-12-06 DIAGNOSIS — J9611 Chronic respiratory failure with hypoxia: Secondary | ICD-10-CM | POA: Diagnosis present

## 2023-12-06 DIAGNOSIS — U071 COVID-19: Secondary | ICD-10-CM | POA: Diagnosis present

## 2023-12-06 DIAGNOSIS — I5032 Chronic diastolic (congestive) heart failure: Secondary | ICD-10-CM | POA: Diagnosis present

## 2023-12-06 LAB — BASIC METABOLIC PANEL WITH GFR
Anion gap: 16 — ABNORMAL HIGH (ref 5–15)
BUN: 23 mg/dL (ref 8–23)
CO2: 26 mmol/L (ref 22–32)
Calcium: 6.3 mg/dL — CL (ref 8.9–10.3)
Chloride: 97 mmol/L — ABNORMAL LOW (ref 98–111)
Creatinine, Ser: 6.55 mg/dL — ABNORMAL HIGH (ref 0.44–1.00)
GFR, Estimated: 6 mL/min — ABNORMAL LOW (ref >60)
Glucose, Bld: 94 mg/dL (ref 70–99)
Potassium: 3.4 mmol/L — ABNORMAL LOW (ref 3.5–5.1)
Sodium: 139 mmol/L (ref 135–145)

## 2023-12-06 LAB — CBC
HCT: 26.3 % — ABNORMAL LOW (ref 36.0–46.0)
Hemoglobin: 8.4 g/dL — ABNORMAL LOW (ref 12.0–15.0)
MCH: 33.5 pg (ref 26.0–34.0)
MCHC: 31.9 g/dL (ref 30.0–36.0)
MCV: 104.8 fL — ABNORMAL HIGH (ref 80.0–100.0)
Platelets: 186 10*3/uL (ref 150–400)
RBC: 2.51 MIL/uL — ABNORMAL LOW (ref 3.87–5.11)
RDW: 15.4 % (ref 11.5–15.5)
WBC: 5.9 10*3/uL (ref 4.0–10.5)
nRBC: 0 % (ref 0.0–0.2)

## 2023-12-06 LAB — MAGNESIUM: Magnesium: 2.2 mg/dL (ref 1.7–2.4)

## 2023-12-06 LAB — HEPATITIS B SURFACE ANTIBODY, QUANTITATIVE: Hep B S AB Quant (Post): 1687 m[IU]/mL

## 2023-12-06 MED ORDER — SODIUM CHLORIDE 0.9 % IV SOLN
2.0000 g | INTRAVENOUS | Status: DC
  Administered 2023-12-06: 2 g via INTRAVENOUS
  Filled 2023-12-06: qty 20

## 2023-12-06 MED ORDER — HEPARIN SODIUM (PORCINE) 1000 UNIT/ML IJ SOLN
INTRAMUSCULAR | Status: AC
  Filled 2023-12-06: qty 3

## 2023-12-06 MED ORDER — DOXYCYCLINE HYCLATE 100 MG PO TABS
100.0000 mg | ORAL_TABLET | Freq: Two times a day (BID) | ORAL | Status: DC
  Administered 2023-12-06 (×2): 100 mg via ORAL
  Filled 2023-12-06 (×2): qty 1

## 2023-12-06 MED ORDER — CALCIUM GLUCONATE-NACL 1-0.675 GM/50ML-% IV SOLN
1.0000 g | Freq: Once | INTRAVENOUS | Status: AC
  Administered 2023-12-06: 1000 mg via INTRAVENOUS
  Filled 2023-12-06: qty 50

## 2023-12-06 MED ORDER — CALCITRIOL 0.5 MCG PO CAPS: 0.5000 ug | ORAL_CAPSULE | ORAL | Status: DC

## 2023-12-06 NOTE — Progress Notes (Signed)
 PROGRESS NOTE  Mallory Chavez DGU:[id number] DOB: 1935-07-07 DOA: 12/04/2023 PCP: Rosslyn Coons, MD   LOS: 0 days   Brief narrative:  Mallory Chavez is a 88 y.o. female with past medical history significant of ESRD on dialysis MWF schedule, chronic gout of the left foot, COPD, nocturnal hypoxia on oxygen , chronic allergic rhinitis, heart failure with preserved ejection fraction, history of parathyroidectomy with autotransplant to right forearm, chronic hypocalcemia managed by nephrology and essential hypertension presented to the hospital with generalized malaise lack of appetite fatigue weakness diarrhea nasal congestion and cough for last 3 days.  Patient reported that she had gone to dialysis on the day of presentation.  Had few loose stools as well.  In the ED, patient had no leukocytosis.  Hemoglobin of 8.7.  Lactate of 1.0 patient tested positive for COVID-19 PCR.  BMP was notable for mild hypokalemia with potassium of 3.4.  Creatinine was elevated at 3.6.  Chest x-ray showed patchy airspace opacities in the right lung suspicious for pneumonia.  CT scan of the abdomen pelvis showed colonic diverticulosis but no acute findings.  Patient was then considered for admission to hospital for further evaluation and treatment.      Assessment/Plan: Principal Problem:   CAP (community acquired pneumonia) Active Problems:   ESRD (end stage renal disease) (HCC)   Hypocalcemia due to chronic kidney disease   COVID-19 virus infection   History of gout   History of COPD   Nocturnal hypoxia   Chronic systolic CHF (congestive heart failure) (HCC)   History of parathyroidectomy   Pneumonia   Community acquired pneumonia COVID-19 infection Diarrhoea Chest x-ray with right-sided pneumonia.  On Rocephin and doxycycline.  Patient does not make urine.  Blood cultures negative in 1 day.  MRSA PCR negative.  Procalcitonin 1.5.  Continue current antibiotics.  Slightly better today.  Continue airborne  precautions.    ESRD on dialysis MWF schedule Nephrology consulted for hemodialysis today.   History of COPD on nocturnal hypoxia on 2 L oxygen  Continue Dulera  albuterol  Mucinex and supportive care.   Grade 1 diastolic heart failure with preserved EF 50% Volume managed with hemodialysis.    History of parathyroidectomy and hypocalcemia Continue Tums.  Calcium  level was low today and required 1 g of IV calcium  today.  Continue Calcitrol.  Debility weakness.  Will get PT OT evaluation.  DVT prophylaxis: heparin  injection 5,000 Units Start: 12/04/23 2300 SCDs Start: 12/04/23 2251 Place TED hose Start: 12/04/23 2251   Disposition: Home with home health in 1 to 2 days, PT evaluation pending.  Status is: Inpatient The patient is  inpatient because: Pneumonia, IV antibiotic, pending clinical improvement, hypocalcemia.    Code Status:     Code Status: Full Code  Family Communication: None at bedside  Consultants: Nephrology  Procedures: Hemodialysis  Anti-infectives:  Rocephin and doxycycline  Anti-infectives (From admission, onward)    Start     Dose/Rate Route Frequency Ordered Stop   12/06/23 1000  cefTRIAXone (ROCEPHIN) 2 g in sodium chloride  0.9 % 100 mL IVPB        2 g 200 mL/hr over 30 Minutes Intravenous Every 24 hours 12/06/23 0748 12/11/23 0959   12/06/23 0845  doxycycline (VIBRA-TABS) tablet 100 mg        100 mg Oral 2 times daily 12/06/23 0748 12/11/23 1959   12/05/23 1000  cefTRIAXone (ROCEPHIN) 1 g in sodium chloride  0.9 % 100 mL IVPB  Status:  Discontinued  1 g 200 mL/hr over 30 Minutes Intravenous Every 24 hours 12/04/23 2253 12/06/23 0748   12/05/23 0000  doxycycline (VIBRAMYCIN) 100 mg in sodium chloride  0.9 % 250 mL IVPB  Status:  Discontinued        100 mg 125 mL/hr over 120 Minutes Intravenous Every 12 hours 12/04/23 2250 12/06/23 0748   12/04/23 2230  cefTRIAXone (ROCEPHIN) 1 g in sodium chloride  0.9 % 100 mL IVPB        1 g 200 mL/hr over  30 Minutes Intravenous  Once 12/04/23 2224 12/04/23 2326   12/04/23 2230  azithromycin  (ZITHROMAX ) 500 mg in sodium chloride  0.9 % 250 mL IVPB        500 mg 250 mL/hr over 60 Minutes Intravenous  Once 12/04/23 2224 12/05/23 0031       Subjective: Today, patient was seen and examined at bedside.  Patient states that she feels a little better with breathing today.  Still with diarrhea and decreased appetite and does not feel at her baseline.  Has some shortness of breath with weakness.  Objective: Vitals:   12/06/23 0425 12/06/23 0936  BP: 131/63   Pulse: 82   Resp: 18   Temp: 98.6 F (37 C)   SpO2: (!) 87% 95%    Intake/Output Summary (Last 24 hours) at 12/06/2023 1218 Last data filed at 12/05/2023 1834 Gross per 24 hour  Intake 702.93 ml  Output --  Net 702.93 ml   Filed Weights   12/04/23 2000  Weight: 69.9 kg   Body mass index is 26.43 kg/m.   Physical Exam:  GENERAL: Patient is alert awake and oriented.  On nasal cannula oxygen , elderly female, not in obvious distress,  weak and deconditioned HENT: No scleral pallor or icterus. Pupils equally reactive to light. Oral mucosa is moist NECK: is supple, no gross swelling noted. CHEST: Decreased breath sounds bilaterally.  Coarse breath sounds noted. CVS: S1 and S2 heard, no murmur. Regular rate and rhythm.  ABDOMEN: Soft, non-tender, bowel sounds are present. EXTREMITIES: No edema. CNS: Cranial nerves are intact.  Moves all extremities. SKIN: warm and dry without rashes.  Data Review: I have personally reviewed the following laboratory data and studies,  CBC: Recent Labs  Lab 12/04/23 2026 12/05/23 0413 12/06/23 0228  WBC 5.6 5.3 5.9  NEUTROABS 3.8  --   --   HGB 8.7* 8.4* 8.4*  HCT 27.8* 26.2* 26.3*  MCV 105.3* 107.4* 104.8*  PLT 168 162 186   Basic Metabolic Panel: Recent Labs  Lab 12/04/23 2026 12/05/23 0413 12/06/23 0228  NA 141 138 139  K 3.4* 3.4* 3.4*  CL 93* 95* 97*  CO2 29 28 26   GLUCOSE  105* 90 94  BUN 12 14 23   CREATININE 3.68* 4.33* 6.55*  CALCIUM  7.3* 6.6* 6.3*  MG  --   --  2.2   Liver Function Tests: Recent Labs  Lab 12/04/23 2026 12/05/23 0413  AST 33 31  ALT 17 17  ALKPHOS 29* 30*  BILITOT 0.7 0.7  PROT 7.3 6.8  ALBUMIN 3.4* 3.0*   No results for input(s): "LIPASE", "AMYLASE" in the last 168 hours. No results for input(s): "AMMONIA" in the last 168 hours. Cardiac Enzymes: No results for input(s): "CKTOTAL", "CKMB", "CKMBINDEX", "TROPONINI" in the last 168 hours. BNP (last 3 results) No results for input(s): "BNP" in the last 8760 hours.  ProBNP (last 3 results) No results for input(s): "PROBNP" in the last 8760 hours.  CBG: No results for input(s): "GLUCAP"  in the last 168 hours. Recent Results (from the past 240 hours)  Resp panel by RT-PCR (RSV, Flu A&B, Covid) Anterior Nasal Swab     Status: Abnormal   Collection Time: 12/04/23  8:27 PM   Specimen: Anterior Nasal Swab  Result Value Ref Range Status   SARS Coronavirus 2 by RT PCR POSITIVE (A) NEGATIVE Final   Influenza A by PCR NEGATIVE NEGATIVE Final   Influenza B by PCR NEGATIVE NEGATIVE Final    Comment: (NOTE) The Xpert Xpress SARS-CoV-2/FLU/RSV plus assay is intended as an aid in the diagnosis of influenza from Nasopharyngeal swab specimens and should not be used as a sole basis for treatment. Nasal washings and aspirates are unacceptable for Xpert Xpress SARS-CoV-2/FLU/RSV testing.  Fact Sheet for Patients: BloggerCourse.com  Fact Sheet for Healthcare Providers: SeriousBroker.it  This test is not yet approved or cleared by the [place]  FDA and has been authorized for detection and/or diagnosis of SARS-CoV-2 by FDA under an Emergency Use Authorization (EUA). This EUA will remain in effect (meaning this test can be used) for the duration of the COVID-19 declaration under Section 564(b)(1) of the Act, 21 U.S.C. section  360bbb-3(b)(1), unless the authorization is terminated or revoked.     Resp Syncytial Virus by PCR NEGATIVE NEGATIVE Final    Comment: (NOTE) Fact Sheet for Patients: BloggerCourse.com  Fact Sheet for Healthcare Providers: SeriousBroker.it  This test is not yet approved or cleared by the [place]  FDA and has been authorized for detection and/or diagnosis of SARS-CoV-2 by FDA under an Emergency Use Authorization (EUA). This EUA will remain in effect (meaning this test can be used) for the duration of the COVID-19 declaration under Section 564(b)(1) of the Act, 21 U.S.C. section 360bbb-3(b)(1), unless the authorization is terminated or revoked.  Performed at [facility], [address] [place], [place] [address]   Culture, blood (routine x 2) Call MD if unable to obtain prior to antibiotics being given     Status: None (Preliminary result)   Collection Time: 12/04/23 10:35 PM   Specimen: BLOOD RIGHT ARM  Result Value Ref Range Status   Specimen Description BLOOD RIGHT ARM  Final   Special Requests   Final    BOTTLES DRAWN AEROBIC ONLY Blood Culture results may not be optimal due to an inadequate volume of blood received in culture bottles   Culture   Final    NO GROWTH 1 DAY Performed at [facility], [address], [place], [place] [address]    Report Status PENDING  Incomplete  Culture, blood (routine x 2) Call MD if unable to obtain prior to antibiotics being given     Status: None (Preliminary result)   Collection Time: 12/04/23 11:41 PM   Specimen: BLOOD  Result Value Ref Range Status   Specimen Description BLOOD RIGHT ANTECUBITAL  Final   Special Requests   Final    BOTTLES DRAWN AEROBIC AND ANAEROBIC Blood Culture results may not be optimal due to an inadequate volume of blood received in culture bottles   Culture   Final    NO GROWTH 1 DAY Performed at [facility], [address],  [place], [place] [address]    Report Status PENDING  Incomplete  MRSA Next Gen by PCR, Nasal     Status: None   Collection Time: 12/05/23 10:21 AM   Specimen: Nasal Mucosa; Nasal Swab  Result Value Ref Range Status   MRSA by PCR Next Gen NOT  DETECTED NOT DETECTED Final    Comment: (NOTE) The GeneXpert MRSA Assay (FDA approved for NASAL specimens only), is one component of a comprehensive MRSA colonization surveillance program. It is not intended to diagnose MRSA infection nor to guide or monitor treatment for MRSA infections. Test performance is not FDA approved in patients less than 71 years old. Performed at [facility], [address] [place], [place] [address]      Studies: CT ABDOMEN PELVIS WO CONTRAST Result Date: 12/04/2023 EXAM: CT ABDOMEN AND PELVIS WITHOUT CONTRAST 12/04/2023 10:15:05 PM TECHNIQUE: CT of the abdomen and pelvis was performed without the administration of intravenous contrast. Multiplanar reformatted images are provided for review. Automated exposure control, iterative reconstruction, and/or weight based adjustment of the mA/kV was utilized to reduce the radiation dose to as low as reasonably achievable. COMPARISON: 02/08/2022 CLINICAL HISTORY: Abdominal pain, acute, nonlocalized. Arrives [facility] from home with main complaint of loss of appetite, generalized not feeling well, diarrhea, and nasal congestion x 3 days. Says she had a previous bowel obstruction that presented very similarly. FINDINGS: LOWER CHEST: Subpleural scarring in the bilateral lower lobes. LIVER: The liver is unremarkable. GALLBLADDER AND BILE DUCTS: Gallbladder is unremarkable. No biliary ductal dilatation. SPLEEN: No acute abnormality. PANCREAS: No acute abnormality. ADRENAL GLANDS: No acute abnormality. KIDNEYS, URETERS AND BLADDER: Scattered bilateral renal cysts, including a 4.6 cm simple cyst in the right lower kidney (image 31) and a 2.0 cm probable hemorrhagic cyst inferiorly in the left  kidney (image 31), poorly evaluated on the current study, likely benign (Bosniak 1 to 2). Given the patient's age, no follow up is recommended. No stones in the kidneys or ureters. No hydronephrosis. No perinephric or periureteral stranding. Urinary bladder is unremarkable. GI AND BOWEL: Normal appendix (image 48). Left colonic diverticulosis, without evidence of diverticulitis. Stomach demonstrates no acute abnormality. There is no bowel obstruction. PERITONEUM AND RETROPERITONEUM: No ascites. No free air. VASCULATURE: Atherosclerotic calcifications of the abdominal aorta and branch vessels. LYMPH NODES: No lymphadenopathy. REPRODUCTIVE ORGANS: Status post hysterectomy. 4.7 cm right adnexal cyst (image 62), previously 2.8 cm. Given the patient's age, annual follow-up ultrasound is considered optional. BONES AND SOFT TISSUES: No acute osseous abnormality. No focal soft tissue abnormality. IMPRESSION: 1. No acute findings. 2. Left colonic diverticulosis, without evidence of diverticulitis. 3. 4.7 cm right adnexal cyst, progressive. Given the patient's age, annual follow-up ultrasound is considered optional. Electronically signed by: Zadie Herter MD 12/04/2023 10:30 PM EDT RP Workstation: [id number]   DG Chest Portable 1 View Result Date: 12/04/2023 CLINICAL DATA:  Cough, COVID. EXAM: PORTABLE CHEST 1 VIEW COMPARISON:  Radiograph 01/24/2023 FINDINGS: Patchy airspace disease at the right lung base. Streaky left lung base opacities favor scarring. Stable heart size and mediastinal contours. Aortic atherosclerosis. Left axillary vascular stent. No pulmonary edema. No pneumothorax or pleural effusion. IMPRESSION: Patchy airspace disease at the right lung base, suspicious for pneumonia. Electronically Signed   By: Chadwick Colonel M.D.   On: 12/04/2023 22:18      Rosena Conradi, MD  [facility] 12/06/2023  If 7PM-7AM, please contact night-coverage

## 2023-12-06 NOTE — Progress Notes (Signed)
 Mobility Specialist: Progress Note   12/06/23 1512  Mobility  Activity Transferred to/from [facility]  Level of Assistance Contact guard assist, steadying assist  Assistive Device Front wheel walker  Activity Response Tolerated well  Mobility Referral Yes  Mobility visit 1 Mobility  Mobility Specialist Start Time (ACUTE ONLY) 1420  Mobility Specialist Stop Time (ACUTE ONLY) 1430  Mobility Specialist Time Calculation (min) (ACUTE ONLY) 10 min    Pt received in bed, only agreeable to transfer to [facility]. CG for pivot. No complaints. Left on BSC on with all needs met, call bell in reach.   Deloria Fetch Mobility Specialist Please contact via SecureChat or Rehab office at [phone]

## 2023-12-06 NOTE — Plan of Care (Signed)
   Problem: Education: Goal: Knowledge of risk factors and measures for prevention of condition will improve Outcome: Progressing   Problem: Coping: Goal: Psychosocial and spiritual needs will be supported Outcome: Progressing   Problem: Respiratory: Goal: Will maintain a patent airway Outcome: Progressing Goal: Complications related to the disease process, condition or treatment will be avoided or minimized Outcome: Progressing   Problem: Education: Goal: Knowledge of General Education information will improve Description: Including pain rating scale, medication(s)/side effects and non-pharmacologic comfort measures Outcome: Progressing   Problem: Health Behavior/Discharge Planning: Goal: Ability to manage health-related needs will improve Outcome: Progressing   Problem: Clinical Measurements: Goal: Ability to maintain clinical measurements within normal limits will improve Outcome: Progressing Goal: Will remain free from infection Outcome: Progressing Goal: Diagnostic test results will improve Outcome: Progressing Goal: Respiratory complications will improve Outcome: Progressing Goal: Cardiovascular complication will be avoided Outcome: Progressing   Problem: Activity: Goal: Risk for activity intolerance will decrease Outcome: Progressing   Problem: Nutrition: Goal: Adequate nutrition will be maintained Outcome: Progressing   Problem: Coping: Goal: Level of anxiety will decrease Outcome: Progressing   Problem: Elimination: Goal: Will not experience complications related to bowel motility Outcome: Progressing Goal: Will not experience complications related to urinary retention Outcome: Progressing   Problem: Pain Managment: Goal: General experience of comfort will improve and/or be controlled Outcome: Progressing   Problem: Safety: Goal: Ability to remain free from injury will improve Outcome: Progressing   Problem: Skin Integrity: Goal: Risk for impaired  skin integrity will decrease Outcome: Progressing   Problem: Activity: Goal: Ability to tolerate increased activity will improve Outcome: Progressing   Problem: Clinical Measurements: Goal: Ability to maintain a body temperature in the normal range will improve Outcome: Progressing   Problem: Respiratory: Goal: Ability to maintain adequate ventilation will improve Outcome: Progressing Goal: Ability to maintain a clear airway will improve Outcome: Progressing

## 2023-12-06 NOTE — Progress Notes (Signed)
 Transition of Care [facility]) - Inpatient Brief Assessment   Patient Details  Name: ODELLE KOSIER MRN: [id number] Date of Birth: 1936/01/06  Transition of Care [facility] CM/SW Contact:    Dane Dung, RN Phone Number: 12/06/2023, 3:39 PM   Clinical Narrative: CM met with the patient at the bedside to discuss TOC needs.  The patient lives at home alone and daughter assists at the home.  Patient has DME at the home including home oxygen  at 2L/min [place], rolator, shower seat.    Patient was offered Medicare choice to the patient and the patient did not have a preference.  I called Darline Eis and Randel Buss, RNCM accepted for [facility] Pt/ aide.  HH orders placed to be co-signed by MD.  No other TOC needs and patient plans to return home by car when medically stable for discharge.   Transition of Care Asessment: Insurance and Status: (P) Insurance coverage has been reviewed Patient has primary care physician: (P) Yes Home environment has been reviewed: (P) from home alone Prior level of function:: (P) Rolator Prior/Current Home Services: (P) No current home services Social Drivers of Health Review: (P) SDOH reviewed interventions complete Readmission risk has been reviewed: (P) Yes Transition of care needs: (P) transition of care needs identified, TOC will continue to follow

## 2023-12-06 NOTE — Progress Notes (Signed)
 [facility] Progress Note   Subjective:   Patient seen in room, reports she is feeling a bit better today. Reports breathing is improved. Still having some diarrhea.   Objective Vitals:   12/05/23 2317 12/06/23 0039 12/06/23 0425 12/06/23 0936  BP: (!) 116/55  131/63   Pulse: 78  82   Resp: 18  18   Temp: 98.7 F (37.1 C)  98.6 F (37 C)   TempSrc:      SpO2: (!) 82% 94% (!) 87% 95%  Weight:      Height:       Physical Exam General: Alert female in NAD Heart: RRR, no murmurs, rubs or gallops Lungs: CTA bilaterally, on O2 via [place]  Abdomen: Soft, non-distended Extremities: No edema b/l lower extremities Dialysis Access: AVF + t/b  Additional Objective Labs: Basic Metabolic Panel: Recent Labs  Lab 12/04/23 2026 12/05/23 0413 12/06/23 0228  NA 141 138 139  K 3.4* 3.4* 3.4*  CL 93* 95* 97*  CO2 29 28 26   GLUCOSE 105* 90 94  BUN 12 14 23   CREATININE 3.68* 4.33* 6.55*  CALCIUM  7.3* 6.6* 6.3*   Liver Function Tests: Recent Labs  Lab 12/04/23 2026 12/05/23 0413  AST 33 31  ALT 17 17  ALKPHOS 29* 30*  BILITOT 0.7 0.7  PROT 7.3 6.8  ALBUMIN 3.4* 3.0*   No results for input(s): "LIPASE", "AMYLASE" in the last 168 hours. CBC: Recent Labs  Lab 12/04/23 2026 12/05/23 0413 12/06/23 0228  WBC 5.6 5.3 5.9  NEUTROABS 3.8  --   --   HGB 8.7* 8.4* 8.4*  HCT 27.8* 26.2* 26.3*  MCV 105.3* 107.4* 104.8*  PLT 168 162 186   Blood Culture    Component Value Date/Time   SDES BLOOD RIGHT ANTECUBITAL 12/04/2023 2341   SPECREQUEST  12/04/2023 2341    BOTTLES DRAWN AEROBIC AND ANAEROBIC Blood Culture results may not be optimal due to an inadequate volume of blood received in culture bottles   CULT  12/04/2023 2341    NO GROWTH 1 DAY Performed at [facility], [address], [place], [place] [address]    REPTSTATUS PENDING 12/04/2023 2341    Cardiac Enzymes: No results for input(s): "CKTOTAL", "CKMB", "CKMBINDEX", "TROPONINI" in the last 168  hours. CBG: No results for input(s): "GLUCAP" in the last 168 hours. Iron Studies: No results for input(s): "IRON", "TIBC", "TRANSFERRIN", "FERRITIN" in the last 72 hours. @lablastinr3 @ Studies/Results: CT ABDOMEN PELVIS WO CONTRAST Result Date: 12/04/2023 EXAM: CT ABDOMEN AND PELVIS WITHOUT CONTRAST 12/04/2023 10:15:05 PM TECHNIQUE: CT of the abdomen and pelvis was performed without the administration of intravenous contrast. Multiplanar reformatted images are provided for review. Automated exposure control, iterative reconstruction, and/or weight based adjustment of the mA/kV was utilized to reduce the radiation dose to as low as reasonably achievable. COMPARISON: 02/08/2022 CLINICAL HISTORY: Abdominal pain, acute, nonlocalized. Arrives [facility] from home with main complaint of loss of appetite, generalized not feeling well, diarrhea, and nasal congestion x 3 days. Says she had a previous bowel obstruction that presented very similarly. FINDINGS: LOWER CHEST: Subpleural scarring in the bilateral lower lobes. LIVER: The liver is unremarkable. GALLBLADDER AND BILE DUCTS: Gallbladder is unremarkable. No biliary ductal dilatation. SPLEEN: No acute abnormality. PANCREAS: No acute abnormality. ADRENAL GLANDS: No acute abnormality. KIDNEYS, URETERS AND BLADDER: Scattered bilateral renal cysts, including a 4.6 cm simple cyst in the right lower kidney (image 31) and a 2.0 cm probable hemorrhagic cyst inferiorly in the left kidney (image 31),  poorly evaluated on the current study, likely benign (Bosniak 1 to 2). Given the patient's age, no follow up is recommended. No stones in the kidneys or ureters. No hydronephrosis. No perinephric or periureteral stranding. Urinary bladder is unremarkable. GI AND BOWEL: Normal appendix (image 48). Left colonic diverticulosis, without evidence of diverticulitis. Stomach demonstrates no acute abnormality. There is no bowel obstruction. PERITONEUM AND RETROPERITONEUM: No ascites. No  free air. VASCULATURE: Atherosclerotic calcifications of the abdominal aorta and branch vessels. LYMPH NODES: No lymphadenopathy. REPRODUCTIVE ORGANS: Status post hysterectomy. 4.7 cm right adnexal cyst (image 62), previously 2.8 cm. Given the patient's age, annual follow-up ultrasound is considered optional. BONES AND SOFT TISSUES: No acute osseous abnormality. No focal soft tissue abnormality. IMPRESSION: 1. No acute findings. 2. Left colonic diverticulosis, without evidence of diverticulitis. 3. 4.7 cm right adnexal cyst, progressive. Given the patient's age, annual follow-up ultrasound is considered optional. Electronically signed by: Zadie Herter MD 12/04/2023 10:30 PM EDT RP Workstation: [id number]   DG Chest Portable 1 View Result Date: 12/04/2023 CLINICAL DATA:  Cough, COVID. EXAM: PORTABLE CHEST 1 VIEW COMPARISON:  Radiograph 01/24/2023 FINDINGS: Patchy airspace disease at the right lung base. Streaky left lung base opacities favor scarring. Stable heart size and mediastinal contours. Aortic atherosclerosis. Left axillary vascular stent. No pulmonary edema. No pneumothorax or pleural effusion. IMPRESSION: Patchy airspace disease at the right lung base, suspicious for pneumonia. Electronically Signed   By: Chadwick Colonel M.D.   On: 12/04/2023 22:18   Medications:  anticoagulant sodium citrate      cefTRIAXone (ROCEPHIN)  IV 2 g (12/06/23 1125)    aspirin  EC  81 mg Oral QPC supper   calcium  carbonate  1,500 mg Oral Daily   Chlorhexidine  Gluconate Cloth  6 each Topical Q0600   doxycycline  100 mg Oral BID   guaiFENesin  600 mg Oral BID   heparin   3,000 Units Dialysis Once in dialysis   heparin   5,000 Units Subcutaneous Q8H   mometasone -formoterol   2 puff Inhalation BID   sodium chloride  flush  3 mL Intravenous Q12H    Dialysis Orders: [facility] MWF 3.5h   B400   68.2kg   AVF   Heparin  4000 Last OP HD 6/04, post wt 68.2 Good attendance, stays on, gets to dry wt Dry wt lowered recently   IDWG 1- 2.5 kg Mircera 50 mcg q 4 , last 5/14, due 6/11 Vdra 0.5 mcg po three times per week  Assessment/Plan: PNA/ COVID infection: admitted. IV abx ordered.  ESRD: on HD MWF. HD today on regular schedule BP: is not on BP lowering meds. BP's low-normal, stable. Takes midodrine  pre HD MWF.  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  Secondary hyperparathyroidism: CCa is low, phos is pending. On supplemental calcium . Cont binders, resumed calcitriol .   Ramona Burner, PA-C 12/06/2023, 11:35 AM  [facility] Pager: [phone]

## 2023-12-06 NOTE — Progress Notes (Signed)
 PHARMACIST - PHYSICIAN COMMUNICATION DR:   Efrain Grant CONCERNING: Antibiotic IV to Oral Route Change Policy  RECOMMENDATION: This patient is receiving Doxycycline by the intravenous route.  Based on criteria approved by the Pharmacy and Therapeutics Committee, the antibiotic(s) is/are being converted to the equivalent oral dose form(s).   DESCRIPTION: These criteria include: Patient being treated for a respiratory tract infection, urinary tract infection, cellulitis or clostridium difficile associated diarrhea if on metronidazole  The patient is not neutropenic and does not exhibit a GI malabsorption state The patient is eating (either orally or via tube) and/or has been taking other orally administered medications for a least 24 hours The patient is improving clinically and has a Tmax < 100.5  If you have questions about this conversion, please contact the Pharmacy Department  []   [phone] )  Cristine Done []   [phone] )  [facility] [x]   [phone] )  Arlin Benes []   [phone] )  [facility] []   [phone] )  [facility]

## 2023-12-06 NOTE — Evaluation (Signed)
 Physical Therapy Evaluation Patient Details Name: Mallory Chavez MRN: [id number] DOB: May 04, 1936 Today's Date: 12/06/2023  History of Present Illness  88 y.o. female admitted 12/04/23 with malaise, diarrhea, fatigue, CAP. PMhx: ESRD on HD MWF, gout, COPD, nocturnal hypoxia on O2, HFpEF, parathyroidectomy with autotransplant to right forearm, chronic hypocalcemia, HTN  Clinical Impression  Pt pleasant and willing to mobilize in room. SOB with desaturation with limited gait and required 2L to recover and maintain >90% with limited gait. Pt lives alone with assist of daughter and [employer] for transportation. Pt states she uses O2 intermittently at home and currently benefits during all mobility. Pt with decreased balance, pulmonary function and activity tolerance who will benefit from acute therapy to maximize mobility and safety. HHPT recommended.         If plan is discharge home, recommend the following: A little help with walking and/or transfers;A little help with bathing/dressing/bathroom;Assistance with cooking/housework;Assist for transportation   Can travel by private vehicle        Equipment Recommendations None recommended by PT  Recommendations for Other Services       Functional Status Assessment Patient has had a recent decline in their functional status and demonstrates the ability to make significant improvements in function in a reasonable and predictable amount of time.     Precautions / Restrictions Precautions Precautions: Fall;Other (comment) Recall of Precautions/Restrictions: Intact Precaution/Restrictions Comments: watch sats      Mobility  Bed Mobility Overal bed mobility: Modified Independent             General bed mobility comments: bed flat    Transfers Overall transfer level: Needs assistance   Transfers: Sit to/from Stand Sit to Stand: Contact guard assist           General transfer comment: pt able to stand from bed without difficulty  but posterior LOB when attempting to stand from toilet and pt not utilizing rail despite cues with assist to recover balance in standing    Ambulation/Gait Ambulation/Gait assistance: Contact guard assist Gait Distance (Feet): 20 Feet Assistive device: Rolling walker (2 wheels) Gait Pattern/deviations: Step-through pattern, Decreased stride length   Gait velocity interpretation: 1.31 - 2.62 ft/sec, indicative of limited community ambulator   General Gait Details: pt walked 20' x 2 trials with rw then additional 8' with pt holding unto footboard of bed. pt with desaturation to 84% on RA with limited gait and able to maintain 93% on 2L with 8' of gait  Stairs            Wheelchair Mobility     Tilt Bed    Modified Rankin (Stroke Patients Only)       Balance Overall balance assessment: Needs assistance Sitting-balance support: No upper extremity supported, Feet supported Sitting balance-Leahy Scale: Fair     Standing balance support: Bilateral upper extremity supported, Single extremity supported Standing balance-Leahy Scale: Poor Standing balance comment: furniture and rW in standing                             Pertinent Vitals/Pain Pain Assessment Pain Assessment: No/denies pain    Home Living Family/patient expects to be discharged to:: Private residence Living Arrangements: Alone Available Help at Discharge: Family;Available PRN/intermittently Type of Home: Apartment Home Access: Level entry       Home Layout: One level Home Equipment: Rollator (4 wheels);Grab bars - toilet;Hand held shower head;Grab bars - tub/shower;Shower seat      Prior  Function Prior Level of Function : Independent/Modified Independent               ADLs Comments: pt drives pt and she takes SCAT to HD     Extremity/Trunk Assessment   Upper Extremity Assessment Upper Extremity Assessment: Overall WFL for tasks assessed    Lower Extremity Assessment Lower  Extremity Assessment: Overall WFL for tasks assessed    Cervical / Trunk Assessment Cervical / Trunk Assessment: Normal  Communication   Communication Communication: No apparent difficulties    Cognition Arousal: Alert Behavior During Therapy: WFL for tasks assessed/performed   PT - Cognitive impairments: No apparent impairments                         Following commands: Intact       Cueing Cueing Techniques: Verbal cues     General Comments      Exercises     Assessment/Plan    PT Assessment Patient needs continued PT services  PT Problem List Cardiopulmonary status limiting activity;Decreased activity tolerance;Decreased balance;Decreased mobility;Decreased knowledge of use of DME       PT Treatment Interventions DME instruction;Gait training;Functional mobility training;Therapeutic activities;Therapeutic exercise;Patient/family education;Balance training    PT Goals (Current goals can be found in the Care Plan section)  Acute Rehab PT Goals Patient Stated Goal: return home PT Goal Formulation: With patient Time For Goal Achievement: 12/20/23 Potential to Achieve Goals: Good    Frequency Min 2X/week     Co-evaluation               AM-PAC PT "6 Clicks" Mobility  Outcome Measure Help needed turning from your back to your side while in a flat bed without using bedrails?: None Help needed moving from lying on your back to sitting on the side of a flat bed without using bedrails?: None Help needed moving to and from a bed to a chair (including a wheelchair)?: A Little Help needed standing up from a chair using your arms (e.g., wheelchair or bedside chair)?: A Little Help needed to walk in hospital room?: A Little Help needed climbing 3-5 steps with a railing? : Total 6 Click Score: 18    End of Session Equipment Utilized During Treatment: Oxygen  Activity Tolerance: Patient tolerated treatment well Patient left: in chair;with call bell/phone  within reach Nurse Communication: Mobility status PT Visit Diagnosis: Other abnormalities of gait and mobility (R26.89);Difficulty in walking, not elsewhere classified (R26.2)    Time: 1610-9604 PT Time Calculation (min) (ACUTE ONLY): 24 min   Charges:   PT Evaluation $PT Eval Low Complexity: 1 Low PT Treatments $Therapeutic Activity: 8-22 mins PT General Charges $$ ACUTE PT VISIT: 1 Visit         Annis Baseman, PT Acute Rehabilitation Services Office: [phone]   Jackey Mary Axil Copeman 12/06/2023, 12:42 PM

## 2023-12-07 ENCOUNTER — Other Ambulatory Visit (HOSPITAL_COMMUNITY): Payer: Self-pay

## 2023-12-07 DIAGNOSIS — J189 Pneumonia, unspecified organism: Secondary | ICD-10-CM | POA: Diagnosis not present

## 2023-12-07 MED ORDER — GUAIFENESIN ER 600 MG PO TB12: 600.0000 mg | ORAL_TABLET | Freq: Two times a day (BID) | ORAL | Status: AC | PRN

## 2023-12-07 MED ORDER — POTASSIUM CHLORIDE CRYS ER 20 MEQ PO TBCR: 20.0000 meq | EXTENDED_RELEASE_TABLET | Freq: Once | ORAL | Status: DC

## 2023-12-07 MED ORDER — CEPHALEXIN 500 MG PO CAPS
500.0000 mg | ORAL_CAPSULE | Freq: Three times a day (TID) | ORAL | 0 refills | Status: AC
  Filled 2023-12-07 (×2): qty 9, 3d supply, fill #0

## 2023-12-07 MED ORDER — DOXYCYCLINE HYCLATE 100 MG PO TABS
100.0000 mg | ORAL_TABLET | Freq: Two times a day (BID) | ORAL | 0 refills | Status: AC
  Filled 2023-12-07 (×2): qty 9, 5d supply, fill #0

## 2023-12-07 NOTE — Discharge Instructions (Signed)
 You were cared for by a hospitalist during your hospital stay. If you have any questions about your discharge medications or the care you received while you were in the hospital after you are discharged, you can call the unit and ask to speak with the hospitalist on call if the hospitalist that took care of you is not available. Once you are discharged, your primary care physician will handle any further medical issues. Please note that NO REFILLS for any discharge medications will be authorized once you are discharged, as it is imperative that you return to your primary care physician (or establish a relationship with a primary care physician if you do not have one) for your aftercare needs so that they can reassess your need for medications and monitor your lab values.  Consider blowing up a balloon once per hour while awake to help with your lungs.

## 2023-12-07 NOTE — Discharge Summary (Signed)
 Physician Discharge Summary  Mallory Chavez ZOX:[id number] DOB: Mar 25, 1936 DOA: 12/04/2023  PCP: Rosslyn Coons, MD  Admit date: 12/04/2023 Discharge date: 12/07/2023  Admitted From: Home Disposition: Home  Recommendations for Outpatient Follow-up:  Follow up with PCP in 1 week Follow up with nephrology as originally scheduled Please obtain BMP/CBC in 1 week -this is to ensure anemia is stable/resolving and check in on her hypokalemia. Please follow up on the following pending results: Blood cultures  Home Health: PT  Equipment/Devices: None   Discharge Condition: Good CODE STATUS: Full  Diet recommendation: Heart healthy  Brief/Interim Summary: From H&P: From Dr. Haywood Lisle: "Mallory Chavez is a 88 y.o. female with medical history significant of ESRD on dialysis MWF schedule, chronic gout of the left foot, COPD, nocturnal hypoxia on oxygen , chronic allergic rhinitis, heart failure with preserved ejection fraction, parathyroidectomy with autotransplant to right forearm, chronic hypocalcemia managing by nephrology and essential hypertension presented to emergency department multiple complaint include generalized malaise, loss of appetite, fatigue, diarrhea and nasal congestion for 3 days.  Patient reported when she has bowel movement in the past has similar symptoms.  Patient reported she underwent dialysis today. Patient denies any fever and chills.  Patient reported 2 episodes of loose stool in last 24 hours.  Patient denies any nausea, vomiting and abdominal pain.  No other complaint at this time.     ED Course: At presentation to ED patient is hemodynamically stable. CBC showing WBC count 5.6, stable H&H 8.7 and normal platelet count. Pending CMP.  Normal TSH level and pending T4 level. Respiratory panel positive with COVID-19 PCR. Normal lactic acid level.   CT abdomen pelvis no acute finding.  Left colonic diverticulosis without any diverticulitis. 4.7 cm right adnexal cyst,  progressive. Given the patient's age, annual follow-up ultrasound is considered optional.   Chest x-ray showing patchy airspace disease of the right lung base suspicion for pneumonia.   In the ED patient has been given ceftriaxone  1 g and azithromycin  500 mg."  Interim: Patient steadily improved with antibiotic treatment and supportive care.  Subjective on day of discharge: Patient breathing well.  No coughing, shortness of breath, fevers.  She denies any swelling.  She had dialysis last night.  Next due in 2 days.  She wants to go home.  Discharge Diagnoses:  Principal Problem:   CAP (community acquired pneumonia)  Starting 6/8 she will take 3 days of Keflex 500 mg 3 times daily  Started the evening of 6/7, she will take doxycycline  100 mg twice daily, final dosage on Thursday 6/12.  Blood cultures show no growth to date   Active Problems:   ESRD (end stage renal disease) (HCC)  Appreciate nephrology  Continue outpatient hemodialysis MWF  Continue midodrine  30 minutes before dialysis    Hypocalcemia due to chronic kidney disease  Monitor    COVID-19 virus infection    History of gout  Continue colchicine  as needed    History of COPD  Continue Symbicort  2 puffs twice daily, albuterol  as needed    Chronic systolic CHF (congestive heart failure) (HCC)  She is not decompensated on discharge    Discharge Instructions  Discharge Instructions     Call MD for:  difficulty breathing, headache or visual disturbances   Complete by: As directed    Call MD for:  temperature >100.4   Complete by: As directed    Diet - low sodium heart healthy   Complete by: As directed    Increase activity  slowly   Complete by: As directed    No wound care   Complete by: As directed       Allergies as of 12/07/2023   No Known Allergies      Medication List     TAKE these medications    acetaminophen  500 MG tablet Commonly known as: TYLENOL  Take 1,000 mg by mouth every 6 (six) hours  as needed for mild pain (pain score 1-3) or moderate pain (pain score 4-6).   albuterol  108 (90 Base) MCG/ACT inhaler Commonly known as: ProAir  HFA Inhale 2 puffs into the lungs every 4 hours as needed only  if your can't catch your breath   aspirin  EC 81 MG tablet Take 81 mg by mouth daily after supper.   calcium  carbonate 500 MG chewable tablet Commonly known as: Cal-Gest Antacid Chew and swallow 3 tablets (600 mg of elemental calcium  total) by mouth daily. (DO NOT TAKE WITH MEALS)   cephALEXin 500 MG capsule Commonly known as: KEFLEX Take 1 capsule (500 mg total) by mouth 3 (three) times daily for 3 days. Start taking on: December 08, 2023   colchicine  0.6 MG tablet Take 1 tablet (0.6 mg total) by mouth 2 (two) times daily. What changed:  when to take this additional instructions   DIALYVITE  800 WITH ZINC  0.8 MG Tabs Take 1 tablet by mouth at bedtime   doxycycline  100 MG tablet Commonly known as: VIBRA -TABS Take 1 tablet (100 mg total) by mouth 2 (two) times daily for 9 doses. Starting evening of 6/7.   Dry Eye Relief Drops 0.2-0.2-1 % Soln Generic drug: Glycerin -Hypromellose-PEG 400 Place 1 drop into both eyes daily as needed (Dry eyes).   guaiFENesin  600 MG 12 hr tablet Commonly known as: MUCINEX  Take 1 tablet (600 mg total) by mouth 2 (two) times daily as needed for to loosen phlegm.   lidocaine -prilocaine  cream Commonly known as: EMLA  Apply small amount to skin /to AVF- prior to dialysis   loratadine  10 MG tablet Commonly known as: CLARITIN  Take 10 mg by mouth at bedtime.   midodrine  10 MG tablet Commonly known as: PROAMATINE  Take 1 tablet by mouth as directed. Take one tablet 30 mins before each hemodialysis every Mon, Wed, Fri. May take a 2nd dose while on dialysis for SBP<100   OXYGEN  Inhale 2 L into the lungs daily as needed (with sleep and exertion as needed [facility]).   Symbicort  160-4.5 MCG/ACT inhaler Generic drug: budesonide -formoterol  Inhale 2 puffs into  the lungs 2 (two) times daily.   VITAMIN D3 PO Take 1 capsule by mouth every Monday, Wednesday, and Friday.        Follow-up Information     [facility], [facility] Follow up.   Specialty: Home Health Services Why: [facility] will provide home health services for [facility] PT, aide. Contact information: [address] [place] [address] [phone]                No Known Allergies  Consultations: Nephrology   Procedures/Studies: CT ABDOMEN PELVIS WO CONTRAST Result Date: 12/04/2023 EXAM: CT ABDOMEN AND PELVIS WITHOUT CONTRAST 12/04/2023 10:15:05 PM TECHNIQUE: CT of the abdomen and pelvis was performed without the administration of intravenous contrast. Multiplanar reformatted images are provided for review. Automated exposure control, iterative reconstruction, and/or weight based adjustment of the mA/kV was utilized to reduce the radiation dose to as low as reasonably achievable. COMPARISON: 02/08/2022 CLINICAL HISTORY: Abdominal pain, acute, nonlocalized. Arrives [facility] from home with main complaint  of loss of appetite, generalized not feeling well, diarrhea, and nasal congestion x 3 days. Says she had a previous bowel obstruction that presented very similarly. FINDINGS: LOWER CHEST: Subpleural scarring in the bilateral lower lobes. LIVER: The liver is unremarkable. GALLBLADDER AND BILE DUCTS: Gallbladder is unremarkable. No biliary ductal dilatation. SPLEEN: No acute abnormality. PANCREAS: No acute abnormality. ADRENAL GLANDS: No acute abnormality. KIDNEYS, URETERS AND BLADDER: Scattered bilateral renal cysts, including a 4.6 cm simple cyst in the right lower kidney (image 31) and a 2.0 cm probable hemorrhagic cyst inferiorly in the left kidney (image 31), poorly evaluated on the current study, likely benign (Bosniak 1 to 2). Given the patient's age, no follow up is recommended. No stones in the kidneys or ureters. No hydronephrosis. No perinephric or periureteral  stranding. Urinary bladder is unremarkable. GI AND BOWEL: Normal appendix (image 48). Left colonic diverticulosis, without evidence of diverticulitis. Stomach demonstrates no acute abnormality. There is no bowel obstruction. PERITONEUM AND RETROPERITONEUM: No ascites. No free air. VASCULATURE: Atherosclerotic calcifications of the abdominal aorta and branch vessels. LYMPH NODES: No lymphadenopathy. REPRODUCTIVE ORGANS: Status post hysterectomy. 4.7 cm right adnexal cyst (image 62), previously 2.8 cm. Given the patient's age, annual follow-up ultrasound is considered optional. BONES AND SOFT TISSUES: No acute osseous abnormality. No focal soft tissue abnormality. IMPRESSION: 1. No acute findings. 2. Left colonic diverticulosis, without evidence of diverticulitis. 3. 4.7 cm right adnexal cyst, progressive. Given the patient's age, annual follow-up ultrasound is considered optional. Electronically signed by: Zadie Herter MD 12/04/2023 10:30 PM EDT RP Workstation: [id number]   DG Chest Portable 1 View Result Date: 12/04/2023 CLINICAL DATA:  Cough, COVID. EXAM: PORTABLE CHEST 1 VIEW COMPARISON:  Radiograph 01/24/2023 FINDINGS: Patchy airspace disease at the right lung base. Streaky left lung base opacities favor scarring. Stable heart size and mediastinal contours. Aortic atherosclerosis. Left axillary vascular stent. No pulmonary edema. No pneumothorax or pleural effusion. IMPRESSION: Patchy airspace disease at the right lung base, suspicious for pneumonia. Electronically Signed   By: Chadwick Colonel M.D.   On: 12/04/2023 22:18   DG Foot Complete Left Result Date: 11/26/2023 Please see detailed radiograph report in office note.   Discharge Exam: Vitals:   12/07/23 0837 12/07/23 0841  BP:  (!) 107/33  Pulse: 92 79  Resp:  19  Temp:  98.2 F (36.8 C)  SpO2: 98% 100%    General: Pt is alert, awake, not in acute distress Cardiovascular: RRR, S1/S2 +, no edema Respiratory: CTA bilaterally, no  wheezing, no rhonchi, no respiratory distress, no conversational dyspnea  Abdominal: Soft, NT, ND, bowel sounds + Extremities: no edema, no cyanosis Psych: Normal mood and affect, stable judgement and insight     The results of significant diagnostics from this hospitalization (including imaging, microbiology, ancillary and laboratory) are listed below for reference.     Microbiology: Recent Results (from the past 240 hours)  Resp panel by RT-PCR (RSV, Flu A&B, Covid) Anterior Nasal Swab     Status: Abnormal   Collection Time: 12/04/23  8:27 PM   Specimen: Anterior Nasal Swab  Result Value Ref Range Status   SARS Coronavirus 2 by RT PCR POSITIVE (A) NEGATIVE Final   Influenza A by PCR NEGATIVE NEGATIVE Final   Influenza B by PCR NEGATIVE NEGATIVE Final    Comment: (NOTE) The Xpert Xpress SARS-CoV-2/FLU/RSV plus assay is intended as an aid in the diagnosis of influenza from Nasopharyngeal swab specimens and should not be used as a sole basis for treatment. Nasal washings  and aspirates are unacceptable for Xpert Xpress SARS-CoV-2/FLU/RSV testing.  Fact Sheet for Patients: BloggerCourse.com  Fact Sheet for Healthcare Providers: SeriousBroker.it  This test is not yet approved or cleared by the [place]  FDA and has been authorized for detection and/or diagnosis of SARS-CoV-2 by FDA under an Emergency Use Authorization (EUA). This EUA will remain in effect (meaning this test can be used) for the duration of the COVID-19 declaration under Section 564(b)(1) of the Act, 21 U.S.C. section 360bbb-3(b)(1), unless the authorization is terminated or revoked.     Resp Syncytial Virus by PCR NEGATIVE NEGATIVE Final    Comment: (NOTE) Fact Sheet for Patients: BloggerCourse.com  Fact Sheet for Healthcare Providers: SeriousBroker.it  This test is not yet approved or cleared by the  [place]  FDA and has been authorized for detection and/or diagnosis of SARS-CoV-2 by FDA under an Emergency Use Authorization (EUA). This EUA will remain in effect (meaning this test can be used) for the duration of the COVID-19 declaration under Section 564(b)(1) of the Act, 21 U.S.C. section 360bbb-3(b)(1), unless the authorization is terminated or revoked.  Performed at [facility], [address] [place], [place] [address]   Culture, blood (routine x 2) Call MD if unable to obtain prior to antibiotics being given     Status: None (Preliminary result)   Collection Time: 12/04/23 10:35 PM   Specimen: BLOOD RIGHT ARM  Result Value Ref Range Status   Specimen Description BLOOD RIGHT ARM  Final   Special Requests   Final    BOTTLES DRAWN AEROBIC ONLY Blood Culture results may not be optimal due to an inadequate volume of blood received in culture bottles   Culture   Final    NO GROWTH 2 DAYS Performed at [facility], [address], [place], [place] [address]    Report Status PENDING  Incomplete  Culture, blood (routine x 2) Call MD if unable to obtain prior to antibiotics being given     Status: None (Preliminary result)   Collection Time: 12/04/23 11:41 PM   Specimen: BLOOD  Result Value Ref Range Status   Specimen Description BLOOD RIGHT ANTECUBITAL  Final   Special Requests   Final    BOTTLES DRAWN AEROBIC AND ANAEROBIC Blood Culture results may not be optimal due to an inadequate volume of blood received in culture bottles   Culture   Final    NO GROWTH 2 DAYS Performed at [facility], [address] [place], [place] [address]    Report Status PENDING  Incomplete  MRSA Next Gen by PCR, Nasal     Status: None   Collection Time: 12/05/23 10:21 AM   Specimen: Nasal Mucosa; Nasal Swab  Result Value Ref Range Status   MRSA by PCR Next Gen NOT DETECTED NOT DETECTED Final    Comment: (NOTE) The GeneXpert MRSA Assay (FDA approved for NASAL specimens  only), is one component of a comprehensive MRSA colonization surveillance program. It is not intended to diagnose MRSA infection nor to guide or monitor treatment for MRSA infections. Test performance is not FDA approved in patients less than 77 years old. Performed at [facility], [address], [place], [place] [address]      Labs: Basic Metabolic Panel: Recent Labs  Lab 12/04/23 2026 12/05/23 0413 12/06/23 0228  NA 141 138 139  K 3.4* 3.4* 3.4*  CL 93* 95* 97*  CO2 29 28 26   GLUCOSE 105* 90 94  BUN  12 14 23   CREATININE 3.68* 4.33* 6.55*  CALCIUM  7.3* 6.6* 6.3*  MG  --   --  2.2   Liver Function Tests: Recent Labs  Lab 12/04/23 2026 12/05/23 0413  AST 33 31  ALT 17 17  ALKPHOS 29* 30*  BILITOT 0.7 0.7  PROT 7.3 6.8  ALBUMIN 3.4* 3.0*   CBC: Recent Labs  Lab 12/04/23 2026 12/05/23 0413 12/06/23 0228  WBC 5.6 5.3 5.9  NEUTROABS 3.8  --   --   HGB 8.7* 8.4* 8.4*  HCT 27.8* 26.2* 26.3*  MCV 105.3* 107.4* 104.8*  PLT 168 162 186   Thyroid  function studies Recent Labs    12/04/23 2027  TSH 1.475   Urinalysis    Component Value Date/Time   COLORURINE YELLOW 07/02/2016 1058   APPEARANCEUR CLEAR 07/02/2016 1058   LABSPEC 1.010 07/02/2016 1058   PHURINE 6.0 07/02/2016 1058   GLUCOSEU NEGATIVE 07/02/2016 1058   HGBUR NEGATIVE 07/02/2016 1058   BILIRUBINUR NEGATIVE 07/02/2016 1058   KETONESUR NEGATIVE 07/02/2016 1058   PROTEINUR 30 (A) 07/02/2016 1058   NITRITE NEGATIVE 07/02/2016 1058   LEUKOCYTESUR SMALL (A) 07/02/2016 1058   Sepsis Labs Recent Labs  Lab 12/04/23 2026 12/05/23 0413 12/06/23 0228  WBC 5.6 5.3 5.9   Microbiology Recent Results (from the past 240 hours)  Resp panel by RT-PCR (RSV, Flu A&B, Covid) Anterior Nasal Swab     Status: Abnormal   Collection Time: 12/04/23  8:27 PM   Specimen: Anterior Nasal Swab  Result Value Ref Range Status   SARS Coronavirus 2 by RT PCR POSITIVE (A) NEGATIVE Final   Influenza A by PCR  NEGATIVE NEGATIVE Final   Influenza B by PCR NEGATIVE NEGATIVE Final    Comment: (NOTE) The Xpert Xpress SARS-CoV-2/FLU/RSV plus assay is intended as an aid in the diagnosis of influenza from Nasopharyngeal swab specimens and should not be used as a sole basis for treatment. Nasal washings and aspirates are unacceptable for Xpert Xpress SARS-CoV-2/FLU/RSV testing.  Fact Sheet for Patients: BloggerCourse.com  Fact Sheet for Healthcare Providers: SeriousBroker.it  This test is not yet approved or cleared by the [place]  FDA and has been authorized for detection and/or diagnosis of SARS-CoV-2 by FDA under an Emergency Use Authorization (EUA). This EUA will remain in effect (meaning this test can be used) for the duration of the COVID-19 declaration under Section 564(b)(1) of the Act, 21 U.S.C. section 360bbb-3(b)(1), unless the authorization is terminated or revoked.     Resp Syncytial Virus by PCR NEGATIVE NEGATIVE Final    Comment: (NOTE) Fact Sheet for Patients: BloggerCourse.com  Fact Sheet for Healthcare Providers: SeriousBroker.it  This test is not yet approved or cleared by the [place]  FDA and has been authorized for detection and/or diagnosis of SARS-CoV-2 by FDA under an Emergency Use Authorization (EUA). This EUA will remain in effect (meaning this test can be used) for the duration of the COVID-19 declaration under Section 564(b)(1) of the Act, 21 U.S.C. section 360bbb-3(b)(1), unless the authorization is terminated or revoked.  Performed at [facility], [address] [place], [place] [address]   Culture, blood (routine x 2) Call MD if unable to obtain prior to antibiotics being given     Status: None (Preliminary result)   Collection Time: 12/04/23 10:35 PM   Specimen: BLOOD RIGHT ARM  Result Value Ref Range Status   Specimen Description BLOOD  RIGHT ARM  Final   Special Requests   Final  BOTTLES DRAWN AEROBIC ONLY Blood Culture results may not be optimal due to an inadequate volume of blood received in culture bottles   Culture   Final    NO GROWTH 2 DAYS Performed at [facility], [address], [place], [place] [address]    Report Status PENDING  Incomplete  Culture, blood (routine x 2) Call MD if unable to obtain prior to antibiotics being given     Status: None (Preliminary result)   Collection Time: 12/04/23 11:41 PM   Specimen: BLOOD  Result Value Ref Range Status   Specimen Description BLOOD RIGHT ANTECUBITAL  Final   Special Requests   Final    BOTTLES DRAWN AEROBIC AND ANAEROBIC Blood Culture results may not be optimal due to an inadequate volume of blood received in culture bottles   Culture   Final    NO GROWTH 2 DAYS Performed at [facility], [address], [place], [place] [address]    Report Status PENDING  Incomplete  MRSA Next Gen by PCR, Nasal     Status: None   Collection Time: 12/05/23 10:21 AM   Specimen: Nasal Mucosa; Nasal Swab  Result Value Ref Range Status   MRSA by PCR Next Gen NOT DETECTED NOT DETECTED Final    Comment: (NOTE) The GeneXpert MRSA Assay (FDA approved for NASAL specimens only), is one component of a comprehensive MRSA colonization surveillance program. It is not intended to diagnose MRSA infection nor to guide or monitor treatment for MRSA infections. Test performance is not FDA approved in patients less than 67 years old. Performed at [facility], [address], [place], [place] [address]      Patient was seen and examined on the day of discharge and was found to be in stable condition. Time coordinating discharge: 31 minutes including assessment and coordination of care, as well as examination of the patient.   SIGNED:  Jobe Mulder, DO [facility] 12/07/2023, 3:11 PM

## 2023-12-07 NOTE — Progress Notes (Signed)
 Received patient in bed. Alert and oriented x 4 Informed consent signed and in chart.   TX duration:3 hours and  15 minutes  Patient tolerated well.  Alert, without acute distress.  Hand-off given to patient's nurse.   Access used: AVF Access issues: hard stick   Total UF removed: 1500 Medication(s) given:heparin  bolus 3000 units and 1500 bolus midrun Post HD VS: see table below Post HD weight: 68.5kg   12/07/23 0112  Vitals  Temp 98.3 F (36.8 C)  Temp Source Oral  BP (!) 129/50  MAP (mmHg) 72  BP Location Right Wrist  BP Method Automatic  Patient Position (if appropriate) Lying  Pulse Rate 67  Pulse Rate Source Dinamap  ECG Heart Rate 88  Resp 13  Oxygen  Therapy  SpO2 100 %  O2 Device Nasal Cannula  O2 Flow Rate (L/min) 2 L/min  Patient Activity (if Appropriate) In bed  Pulse Oximetry Type Continuous  During Treatment Monitoring  Blood Flow Rate (mL/min) 0 mL/min  Arterial Pressure (mmHg) 20 mmHg  Venous Pressure (mmHg) 25.86 mmHg  TMP (mmHg) 27.67 mmHg  Ultrafiltration Rate (mL/min) 724 mL/min  Dialysate Flow Rate (mL/min) 299 ml/min  Dialysate Potassium Concentration 3  Dialysate Calcium  Concentration 2.5  Duration of HD Treatment -hour(s) 3.25 hour(s)  Cumulative Fluid Removed (mL) per Treatment  1500.13  HD Safety Checks Performed Yes  Intra-Hemodialysis Comments Tolerated well  Post Treatment  Dialyzer Clearance Lightly streaked  Hemodialysis Intake (mL) 0 mL  Liters Processed 78  Fluid Removed (mL) 1500 mL  Tolerated HD Treatment Yes  Post-Hemodialysis Comments goal met  AVG/AVF Arterial Site Held (minutes) 5 minutes  AVG/AVF Venous Site Held (minutes) 5 minutes  Fistula / Graft Left Forearm Arteriovenous fistula  Placement Date/Time: 07/23/18 1143   Placed prior to admission: No  Orientation: Left  Access Location: Forearm  Access Type: Arteriovenous fistula  Site Condition No complications  Fistula / Graft Assessment Present;Thrill;Bruit   Status Deaccessed;Patent;Flushed      [facility]

## 2023-12-07 NOTE — Discharge Planning (Signed)
 [facility] Patient Discharge Orders- [facility]: [facility]  Patient's name: Mallory Chavez Admit/DC Dates: 12/04/2023 - 12/07/2023  Discharge Diagnoses: COVID   PNA  Aranesp : Given: no    Last Hgb: 8.4 PRBC's Given: no  ESA dose for discharge: no change IV Iron dose at discharge: none  Heparin  change: no  EDW Change: no   Bath Change: yes - change to 3K, 2.5Ca bath  Access intervention/Change: no Details:  Hectorol/Calcitriol  change: no  Discharge Labs: Calcium  6.3 Albumin 3.0 K+ 3.4  Start ONSP  IV Antibiotics: no Details:  On Coumadin?: no Last INR: Next INR: Managed By:   OTHER/APPTS/LAB ORDERS: -Repeat Ca and K next HD     D/C Meds to be reconciled by nurse after every discharge.  Completed ZO:[id number] Latausha Flamm, PA-C   Reviewed by: MD:______ RN_______

## 2023-12-07 NOTE — Progress Notes (Signed)
  [facility] Progress Note   Subjective:    Seen in room No new c/o's  Objective Vitals:   12/07/23 0114 12/07/23 0616 12/07/23 0837 12/07/23 0841  BP:  (!) 150/43  (!) 107/33  Pulse:  95 92 79  Resp:  19  19  Temp:  98.5 F (36.9 C)  98.2 F (36.8 C)  TempSrc:      SpO2:  98% 98% 100%  Weight: 68.5 kg     Height:       Physical Exam General: Alert female in NAD Heart: RRR, no murmurs, rubs or gallops Lungs: CTA bilaterally, on O2 via [place] Abdomen: Soft, non-distended Extremities: No edema b/l lower extremities Dialysis Access: AVF + t/b  Dialysis Orders: [facility] MWF 3.5h   B400   68.2kg   AVF   Heparin  4000 Last OP HD 6/04, post wt 68.2 Good attendance, stays on, gets to dry wt Dry wt lowered recently  IDWG 1- 2.5 kg Mircera 50 mcg q 4 , last 5/14, due 6/11 Vdra 0.5 mcg po three times per week  Assessment/Plan: PNA/ COVID infection: admitted, IV abx ordered.  ESRD: on HD MWF. Had HD here yesterday. Next HD Monday.  BP: is not on BP lowering meds. BP's low-normal, stable. Takes midodrine  pre HD MWF.  Volume: pt appears euvolemic on exam. Follow.  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  Secondary hyperparathyroidism: CCa is low, phos is pending. On supplemental calcium . Cont binders, resumed calcitriol .   Larry Poag  MD  [facility] 12/07/2023, 10:49 AM  Recent Labs  Lab 12/04/23 2026 12/05/23 0413 12/06/23 0228  HGB 8.7* 8.4* 8.4*  ALBUMIN 3.4* 3.0*  --   CALCIUM  7.3* 6.6* 6.3*  CREATININE 3.68* 4.33* 6.55*  K 3.4* 3.4* 3.4*    Inpatient medications:  aspirin  EC  81 mg Oral QPC supper   [START ON 12/09/2023] calcitRIOL   0.5 mcg Oral Q M,W,F-HD   calcium  carbonate  1,500 mg Oral Daily   Chlorhexidine  Gluconate Cloth  6 each Topical Q0600   doxycycline   100 mg Oral BID   guaiFENesin   600 mg Oral BID   heparin   5,000 Units Subcutaneous Q8H   mometasone -formoterol   2 puff Inhalation BID   potassium chloride   20 mEq Oral Once   sodium chloride  flush   3 mL Intravenous Q12H    anticoagulant sodium citrate      cefTRIAXone  (ROCEPHIN )  IV 2 g (12/06/23 1125)   acetaminophen  **OR** acetaminophen , albuterol , alteplase , anticoagulant sodium citrate , feeding supplement (NEPRO CARB STEADY), heparin , lidocaine  (PF), lidocaine -prilocaine , pentafluoroprop-tetrafluoroeth, sodium chloride  flush, trimethobenzamide

## 2023-12-08 ENCOUNTER — Telehealth: Payer: Self-pay | Admitting: Nephrology

## 2023-12-08 NOTE — Telephone Encounter (Signed)
 Transition of Care Contact from Inpatient Facility   Date of Discharge: 12/07/23  Date of Contact: 12/08/23 Method of contact: phone Talked to patient   Patient contacted to discuss transition of care form recent hospitaliztion. Patient was admitted to [facility] from 6/4 to 12/07/23 with the discharge diagnosis of COVID and PNA.     Medication changes were reviewed.  Patient will follow up with is outpatient dialysis center 12/09/23.  Other follow up needs include - does not have guaifenesin , will send Rx to pharm.   Charlotte Cookey, PA-C [employer]

## 2023-12-09 ENCOUNTER — Other Ambulatory Visit (HOSPITAL_COMMUNITY): Payer: Self-pay

## 2023-12-09 ENCOUNTER — Other Ambulatory Visit: Payer: Self-pay

## 2023-12-09 MED ORDER — GUAIFENESIN ER 600 MG PO TB12
600.0000 mg | ORAL_TABLET | Freq: Two times a day (BID) | ORAL | 0 refills | Status: AC | PRN
  Filled 2023-12-09: qty 20, 10d supply, fill #0

## 2023-12-10 ENCOUNTER — Other Ambulatory Visit: Payer: Self-pay

## 2023-12-10 ENCOUNTER — Other Ambulatory Visit (HOSPITAL_COMMUNITY): Payer: Self-pay

## 2023-12-10 LAB — CULTURE, BLOOD (ROUTINE X 2)
Culture: NO GROWTH
Culture: NO GROWTH

## 2023-12-12 ENCOUNTER — Other Ambulatory Visit (HOSPITAL_COMMUNITY): Payer: Self-pay

## 2023-12-12 ENCOUNTER — Ambulatory Visit (HOSPITAL_COMMUNITY)
Admission: RE | Admit: 2023-12-12 | Discharge: 2023-12-12 | Disposition: A | Discharge status: Home / Self Care | Source: Ambulatory Visit | Attending: Podiatry | Admitting: Podiatry

## 2023-12-12 DIAGNOSIS — R0989 Other specified symptoms and signs involving the circulatory and respiratory systems: Secondary | ICD-10-CM | POA: Diagnosis present

## 2023-12-12 LAB — VAS US ABI WITH/WO TBI
Left ABI: 0.74
Right ABI: 0.85

## 2023-12-13 ENCOUNTER — Other Ambulatory Visit: Payer: Self-pay | Admitting: Podiatry

## 2023-12-13 ENCOUNTER — Ambulatory Visit: Payer: Self-pay | Admitting: Podiatry

## 2023-12-13 ENCOUNTER — Other Ambulatory Visit (HOSPITAL_COMMUNITY): Payer: Self-pay

## 2023-12-13 DIAGNOSIS — I739 Peripheral vascular disease, unspecified: Secondary | ICD-10-CM

## 2023-12-13 MED ORDER — DIALYVITE 800/ZINC 0.8 MG PO TABS
1.0000 | ORAL_TABLET | Freq: Every day | ORAL | 3 refills | Status: AC
  Filled 2023-12-13: qty 30, 30d supply, fill #0
  Filled 2023-12-24 – 2024-01-01 (×5): qty 90, 90d supply, fill #0
  Filled 2024-01-02: qty 100, 100d supply, fill #0
  Filled 2024-03-17: qty 100, 100d supply, fill #1
  Filled 2024-07-18: qty 100, 100d supply, fill #2

## 2023-12-13 MED ORDER — MIDODRINE HCL 10 MG PO TABS
ORAL_TABLET | ORAL | 11 refills | Status: AC
  Filled 2023-12-13: qty 30, 30d supply, fill #0
  Filled 2024-01-28: qty 30, 30d supply, fill #1
  Filled 2024-03-03: qty 30, 30d supply, fill #2
  Filled 2024-04-13: qty 30, 30d supply, fill #3
  Filled 2024-05-11: qty 30, 30d supply, fill #4
  Filled 2024-06-17: qty 30, 30d supply, fill #5
  Filled 2024-07-18: qty 30, 30d supply, fill #6

## 2023-12-13 MED ORDER — DOXYCYCLINE HYCLATE 100 MG PO TABS
100.0000 mg | ORAL_TABLET | Freq: Two times a day (BID) | ORAL | 0 refills | Status: DC
  Filled 2023-12-13: qty 10, 5d supply, fill #0

## 2023-12-14 ENCOUNTER — Other Ambulatory Visit (HOSPITAL_COMMUNITY): Payer: Self-pay

## 2023-12-16 ENCOUNTER — Other Ambulatory Visit (HOSPITAL_COMMUNITY): Payer: Self-pay

## 2023-12-17 ENCOUNTER — Ambulatory Visit: Admitting: Podiatry

## 2023-12-17 ENCOUNTER — Other Ambulatory Visit: Payer: Self-pay

## 2023-12-17 ENCOUNTER — Other Ambulatory Visit (HOSPITAL_COMMUNITY): Payer: Self-pay

## 2023-12-17 DIAGNOSIS — I739 Peripheral vascular disease, unspecified: Secondary | ICD-10-CM | POA: Diagnosis not present

## 2023-12-17 DIAGNOSIS — M778 Other enthesopathies, not elsewhere classified: Secondary | ICD-10-CM | POA: Diagnosis not present

## 2023-12-17 DIAGNOSIS — L84 Corns and callosities: Secondary | ICD-10-CM

## 2023-12-17 MED ORDER — DOXYCYCLINE HYCLATE 100 MG PO TABS
100.0000 mg | ORAL_TABLET | Freq: Two times a day (BID) | ORAL | 0 refills | Status: AC
  Filled 2023-12-17: qty 14, 7d supply, fill #0

## 2023-12-17 NOTE — Patient Instructions (Signed)
 If you notice any opening, drainage, or signs of infection call us  at [phone] or go to the ER.   Call [facility] to schedule an appointment to get the blood flow checked.

## 2023-12-17 NOTE — Progress Notes (Signed)
 Seen by provider 12/17/2026.

## 2023-12-22 NOTE — Progress Notes (Signed)
 Subjective: Chief Complaint  Patient presents with   Foot Pain    RM#14 Left foot pain no relief states had ultrasound done.    88 year old female presents the office today with concerns of gout to the left foot.  States that she is still having pain and she points on the area of bunion where she gets majority discomfort.  She did have her arterial studies completed and a vascular referral was placed.  She states that they contacted her but she has not called back to schedule the appointment yet.  She does not report any open lesions.   Objective: AAO x3, NAD DP/PT pulses decreased Bunion is present.  There is minimal amount of callus formation around the area of the bunion.  Once I debrided this there was no underlying ulceration, drainage or signs of infection.  There are some localized edema present but there is no open lesions.  There is no fluctuation or crepitation.  There is no drainable collection noted. No pain with calf compression, swelling, warmth, erythema  Assessment: Bunion, capsulitis, gout; PAD  Plan: -All treatment options discussed with the patient including all alternatives, risks, complications.  -Although she does have a history of gout I think her symptoms are multifactorial.  I do think the bunion itself is causing pain which is resulting in a hyperkeratotic lesion which I debrided today without any complications or bleeding.  She does have some localized edema to the area but there is no cellulitis present.  I prescribed doxycycline .  As I think her symptoms could be coming from circulatory status as well.  She has been number to contact [facility] today in order to get scheduled.  If she has any issues with this to let me know. -Also dispensed offloading pads. -Discussed daily foot inspection.  Return in about 5 weeks (around 01/21/2024).  Mallory Chavez DPM

## 2023-12-24 ENCOUNTER — Other Ambulatory Visit (HOSPITAL_COMMUNITY): Payer: Self-pay

## 2023-12-24 ENCOUNTER — Other Ambulatory Visit: Payer: Self-pay

## 2023-12-25 ENCOUNTER — Other Ambulatory Visit: Payer: Self-pay

## 2023-12-27 ENCOUNTER — Other Ambulatory Visit (HOSPITAL_COMMUNITY): Payer: Self-pay

## 2023-12-28 ENCOUNTER — Other Ambulatory Visit (HOSPITAL_COMMUNITY): Payer: Self-pay

## 2023-12-30 ENCOUNTER — Other Ambulatory Visit: Payer: Self-pay

## 2023-12-31 ENCOUNTER — Other Ambulatory Visit: Payer: Self-pay

## 2023-12-31 ENCOUNTER — Other Ambulatory Visit (HOSPITAL_COMMUNITY): Payer: Self-pay

## 2024-01-01 ENCOUNTER — Other Ambulatory Visit: Payer: Self-pay

## 2024-01-02 ENCOUNTER — Other Ambulatory Visit: Payer: Self-pay

## 2024-01-02 ENCOUNTER — Other Ambulatory Visit (HOSPITAL_COMMUNITY): Payer: Self-pay

## 2024-01-07 ENCOUNTER — Other Ambulatory Visit (HOSPITAL_BASED_OUTPATIENT_CLINIC_OR_DEPARTMENT_OTHER): Payer: Self-pay

## 2024-01-09 ENCOUNTER — Ambulatory Visit (INDEPENDENT_AMBULATORY_CARE_PROVIDER_SITE_OTHER): Admitting: Pulmonary Disease

## 2024-01-09 VITALS — BP 119/56 | HR 91 | Ht 64.0 in | Wt 146.0 lb

## 2024-01-09 DIAGNOSIS — J9611 Chronic respiratory failure with hypoxia: Secondary | ICD-10-CM

## 2024-01-09 DIAGNOSIS — Z87891 Personal history of nicotine dependence: Secondary | ICD-10-CM | POA: Diagnosis not present

## 2024-01-09 DIAGNOSIS — J449 Chronic obstructive pulmonary disease, unspecified: Secondary | ICD-10-CM

## 2024-01-09 NOTE — Patient Instructions (Addendum)
 Make sure you check your oxygen  periodically to reassure yourself that your oxygen  levels stays where he needs to be  Continue using your Prilosec  Call us  if you are still having significant issues with the reflux despite using the Prilosec regularly  Continue graded activities as tolerated  Continue using your inhalers  Call us  with significant concerns  Follow-up in about 3 months

## 2024-01-09 NOTE — Progress Notes (Signed)
 Mallory Chavez    [id number]    05/01/1936  Primary Care Physician:South, Garnette, MD  Referring Physician: Nichole Garnette, MD [address] [place],  [place] [address]  Chief complaint:    Patient being seen as an urgent visit Has been having significant problems with her reflux  HPI:  Patient with Gold 2 COPD, compliant with inhaler Recently had COVID about 6 weeks ago, recovering well  End-stage renal disease on dialysis 3 days a week  Having more problems with reflux recently, has recently gotten back to using Prilosec about 4 to 5 days ago  She does sleep with the head of the bed elevated, last meal is at least 3 to 4 hours before bedtime  She does use oxygen  supplementation and she is usually told at dialysis to keep oxygen  on  She does have a pulse ox at home but has not used it in a while  No significant cough or chest congestion  She was hospitalized for about 3 days for a recent COVID infection-discharge summary was reviewed - Was treated with a course of Keflex  and doxycycline   Outpatient Encounter Medications as of 01/09/2024  Medication Sig   acetaminophen  (TYLENOL ) 500 MG tablet Take 1,000 mg by mouth every 6 (six) hours as needed for mild pain (pain score 1-3) or moderate pain (pain score 4-6).   albuterol  (PROAIR  HFA) 108 (90 Base) MCG/ACT inhaler Inhale 2 puffs into the lungs every 4 hours as needed only  if your can't catch your breath   aspirin  EC 81 MG tablet Take 81 mg by mouth daily after supper.   B Complex-C-Zn-Folic Acid  (DIALYVITE  800 WITH ZINC ) 0.8 MG TABS Take 1 tablet by mouth at bedtime   budesonide -formoterol  (SYMBICORT ) 160-4.5 MCG/ACT inhaler Inhale 2 puffs into the lungs 2 (two) times daily.   calcium  carbonate (CAL-GEST ANTACID) 500 MG chewable tablet Chew and swallow 3 tablets (600 mg of elemental calcium  total) by mouth daily. (DO NOT TAKE WITH MEALS) (Patient taking differently: Chew 1,500 mg by mouth every Monday,  Wednesday, and Friday.)   Cholecalciferol (VITAMIN D3 PO) Take 1 capsule by mouth every Monday, Wednesday, and Friday.   colchicine  0.6 MG tablet Take 1 tablet (0.6 mg total) by mouth 2 (two) times daily. (Patient taking differently: Take 0.6 mg by mouth See admin instructions. Take 1 tablet by mouth up to three times a day as needed for gout flare up)   doxycycline  (VIBRA -TABS) 100 MG tablet Take 1 tablet (100 mg total) by mouth 2 (two) times daily.   Glycerin -Hypromellose-PEG 400 (DRY EYE RELIEF DROPS) 0.2-0.2-1 % SOLN Place 1 drop into both eyes daily as needed (Dry eyes).   guaiFENesin  (MUCINEX ) 600 MG 12 hr tablet Take 1 tablet (600 mg total) by mouth 2 (two) times daily as needed for to loosen phlegm.   guaiFENesin  (MUCINEX ) 600 MG 12 hr tablet Take 1 tablet (600 mg total) by mouth every 12 (twelve) hours as needed for congestion.   lidocaine -prilocaine  (EMLA ) cream Apply small amount to skin /to AVF- prior to dialysis   loratadine  (CLARITIN ) 10 MG tablet Take 10 mg by mouth at bedtime.   midodrine  (PROAMATINE ) 10 MG tablet Take 1 tablet by mouth as directed. Take one tablet 30 mins before each hemodialysis every Mon, Wed, Fri. May take a 2nd dose while on dialysis for SBP<100   midodrine  (PROAMATINE ) 10 MG tablet Take 1 tablet by mouth as directed 30 mins before each hemodialysis every  Mon, Wed, Fri. May take a 2nd dose while on dialysis for SBP<100   OXYGEN  Inhale 2 L into the lungs daily as needed (with sleep and exertion as needed [facility]   [DISCONTINUED] calcium  carbonate (OS-CAL - DOSED IN MG OF ELEMENTAL CALCIUM ) 1250 (500 Ca) MG tablet Take 4 tablets (5,000 mg total) by mouth 3 (three) times daily between meals.   No facility-administered encounter medications on file as of 01/09/2024.    Allergies as of 01/09/2024   (No Known Allergies)    Past Medical History:  Diagnosis Date   Allergy    Arthritis    Asthma    CHF (congestive heart failure) (HCC) 1980   controlled with meds    Chronic respiratory failure (HCC)    COPD (chronic obstructive pulmonary disease) (HCC)    ESRD on hemodialysis (HCC)    MWF at [facility]   GERD (gastroesophageal reflux disease)    Hyperglycemia    Hyperlipidemia    Hypertension    Hyponatremia     Past Surgical History:  Procedure Laterality Date   A/V FISTULAGRAM N/A 09/03/2023   Procedure: A/V Fistulagram;  Surgeon: Melia Lynwood ORN, MD;  Location: [facility] INVASIVE CV LAB;  Service: Cardiovascular;  Laterality: N/A;   AV FISTULA PLACEMENT Left 07/23/2018   Procedure: ARTERIOVENOUS (AV) FISTULA CREATION;  Surgeon: Sheree Penne Bruckner, MD;  Location: [facility] OR;  Service: Vascular;  Laterality: Left;   CATARACT EXTRACTION Bilateral    2024   ECTOPIC PREGNANCY SURGERY  1962   INSERTION OF DIALYSIS CATHETER Right 07/23/2018   Procedure: INSERTION OF DIALYSIS CATHETER;  Surgeon: Sheree Penne Bruckner, MD;  Location[facility] OR;  Service: Vascular;  Laterality: Right;   INSERTION OF DIALYSIS CATHETER Right 01/24/2023   Procedure: ULTRASOUND GUIDED INSERTION OF DIALYSIS CATHETER;  Surgeon: Magda Debby SAILOR, MD;  Location: [facility] OR;  Service: Vascular;  Laterality: Right;   PARATHYROID  EXPLORATION N/A 01/25/2022   Procedure: NECK EXPLORATION WITH PARATHYROIDECTOMY;  Surgeon: Eletha Boas, MD;  Location: [facility] OR;  Service: General;  Laterality: N/A;   PERIPHERAL VASCULAR BALLOON ANGIOPLASTY Left 09/03/2023   Procedure: PERIPHERAL VASCULAR BALLOON ANGIOPLASTY;  Surgeon: Melia Lynwood ORN, MD;  Location: [facility] INVASIVE CV LAB;  Service: Cardiovascular;  Laterality: Left;  cephalic arch   REVISON OF ARTERIOVENOUS FISTULA Left 01/24/2023   Procedure: REVISON OF  LEFT ARM ARTERIOVENOUS FISTULA;  Surgeon: Magda Debby SAILOR, MD;  Location: [facility] OR;  Service: Vascular;  Laterality: Left;   TUMOR REMOVAL     lower back/benign and has come back   VAGINAL HYSTERECTOMY      Family History  Problem Relation Age of Onset   Diabetes Mother    Emphysema Father     Social  History   Socioeconomic History   Marital status: Widowed    Spouse name: Not on file   Number of children: 1   Years of education: Not on file   Highest education level: Not on file  Occupational History   Not on file  Tobacco Use   Smoking status: Former    Current packs/day: 0.00    Average packs/day: 1.5 packs/day for 20.0 years (30.0 ttl pk-yrs)    Types: Cigarettes    Start date: 07/02/1972    Quit date: 07/02/1992    Years since quitting: 31.5   Smokeless tobacco: Never  Vaping Use   Vaping status: Never Used  Substance and Sexual Activity   Alcohol  use: No   Drug use: No   Sexual activity: Not on  file  Other Topics Concern   Not on file  Social History Narrative   Pt lives [place] [address] [place] senior apartment alone. Daughter lives very close by.   Social Drivers of Corporate investment banker Strain: Not on file  Food Insecurity: No Food Insecurity (12/05/2023)   Hunger Vital Sign    Worried About Running Out of Food in the Last Year: Never true    Ran Out of Food in the Last Year: Never true  Transportation Needs: No Transportation Needs (12/05/2023)   PRAPARE - Administrator, Civil Service (Medical): No    Lack of Transportation (Non-Medical): No  Physical Activity: Not on file  Stress: Not on file  Social Connections: Unknown (12/05/2023)   Social Connection and Isolation Panel    Frequency of Communication with Friends and Family: Patient declined    Frequency of Social Gatherings with Friends and Family: Patient declined    Attends Religious Services: Patient declined    Database administrator or Organizations: Patient declined    Attends Banker Meetings: Patient declined    Marital Status: Widowed  Intimate Partner Violence: Not At Risk (12/05/2023)   Humiliation, Afraid, Rape, and Kick questionnaire    Fear of Current or Ex-Partner: No    Emotionally Abused: No    Physically Abused: No    Sexually Abused: No    Review  of Systems  Respiratory:  Positive for shortness of breath.     Vitals:   01/09/24 1606  BP: (!) 119/56  Pulse: 91  SpO2: 99%     Physical Exam Constitutional:      Appearance: Normal appearance.  HENT:     Head: Normocephalic.     Mouth/Throat:     Mouth: Mucous membranes are moist.  Eyes:     General: No scleral icterus. Cardiovascular:     Rate and Rhythm: Normal rate and regular rhythm.     Heart sounds: No murmur heard.    No friction rub.  Pulmonary:     Effort: No respiratory distress.     Breath sounds: No stridor. No wheezing or rhonchi.  Musculoskeletal:     Cervical back: No rigidity or tenderness.  Neurological:     Mental Status: She is alert.  Psychiatric:        Mood and Affect: Mood normal.    Data Reviewed: Recent discharge summary reviewed  Assessment:  Gold 2 COPD  Recent COVID infection  Chronic systolic heart failure  Recent community-acquired pneumonia following COVID infection  Chronic systolic congestive heart failure  End-stage renal disease on hemodialysis  Chronic hypoxemic respiratory failure  Plan/Recommendations: Continue oxygen  supplementation  Encouraged to make sure she checks her oxygen  on a regular basis  Continue bronchodilator treatments - Currently on Symbicort   Continue Prilosec for reflux - Encouraged to give us  a call in a few weeks if despite using Prilosec on a regular basis, still having significant difficulty with acid reflux  Tentative follow-up in about 3 months  Encouraged to call with significant concerns   Jennet Epley MD [facility] 01/09/2024, 4:33 PM  CC: Nichole Senior, MD

## 2024-01-13 NOTE — Progress Notes (Unsigned)
 [facility] [place]  ASSESSMENT / PLAN: Mallory Chavez is a 88 y.o. female with atherosclerosis of native arteries of left lower extremity.  Recommend:  Abstinence from all tobacco products. Blood glucose control with goal A1c < 7%. Blood pressure control with goal blood pressure < 130/80 mmHg. Lipid reduction therapy with goal LDL-C < 55 mg/dL. Aspirin  81mg  by mouth daily. Atorvastatin 40-80mg  PO QD (or other high intensity statin therapy).  She does have a prominent callus on a bunion on her left first metatarsal joint which she says is getting better with conservative therapy.  I reviewed the options going forward including aggressive management with angiography.  We had a detailed discussion of the risk, benefits, and alternatives.  She and I agreed to avoid aggressive intervention for now.  She knows we will need to be aggressive if this deteriorates into an ulcer or if she develops gangrenous skin changes.  CHIEF COMPLAINT: Prominent callus over left foot  HISTORY OF PRESENT ILLNESS: Mallory Chavez is a 88 y.o. female referred to clinic for evaluation of peripheral arterial disease.  The patient is well-known to me, having undergone dialysis access surgery before with me.  Patient describes a painful left great toe.  She saw her podiatrist, Dr. Gershon, for the same.  The patient was prescribed offloading and careful wound care and an ABI was ordered.  This did show decreased ankle-brachial index, and so she was referred for further evaluation.  The patient is a chronically ill elderly woman on home oxygen .  I do not think she walks fast or far enough to claudicate.  She does not describe classic ischemic rest pain symptoms.  She does have a prominent callus on a bunion on her left first metatarsal joint which she says is getting better with conservative therapy.  I reviewed the options going forward including aggressive management with angiography.  We had a  detailed discussion of the risk, benefits, and alternatives.  She and I agreed to avoid aggressive intervention for now.   Past Medical History:  Diagnosis Date   Allergy    Arthritis    Asthma    CHF (congestive heart failure) (HCC) 1980   controlled with meds   Chronic respiratory failure (HCC)    COPD (chronic obstructive pulmonary disease) (HCC)    ESRD on hemodialysis (HCC)    MWF at [facility]   GERD (gastroesophageal reflux disease)    Hyperglycemia    Hyperlipidemia    Hypertension    Hyponatremia     Past Surgical History:  Procedure Laterality Date   A/V FISTULAGRAM N/A 09/03/2023   Procedure: A/V Fistulagram;  Surgeon: Melia Lynwood ORN, MD;  Location: [facility] IN[facility];  Service: Cardiovascular;  Laterality: N/A;   AV FISTULA PLACEMENT Left 07/23/2018   Procedure: ARTERIOVENOUS (AV) FISTULA CREATION;  Surgeon: Sheree Penne Bruckner, MD;  Location[facility] OR;  Service: Vascular;  Laterality: Left;   CATARACT EXTRACTION Bilateral    2024   ECTOPIC PREGNANCY SURGERY  1962   INSERTION OF DIALYSIS CATHETER Right 07/23/2018   Procedure: INSERTION OF DIALYSIS CATHETER;  Surgeon: Sheree Penne Bruckner, MD;  Location: [facility] OR;  Service: Vascular;  Laterality: Right;   INSERTION OF DIALYSIS CATHETER Right 01/24/2023   Procedure: ULTRASOUND GUIDED INSERTION OF DIALYSIS CATHETER;  Surgeon: Magda Debby SAILOR, MD;  Location: [facility] OR;  Service: Vascular;  Laterality: Right;   PARATHYROID  EXPLORATION N/A 01/25/2022   Procedure: NECK EXPLORATION WITH PARATHYROIDECTOMY;  Surgeon: Eletha Boas, MD;  Location: [facility] OR;  Service: General;  Laterality: N/A;   PERIPHERAL VASCULAR BALLOON ANGIOPLASTY Left 09/03/2023   Procedure: PERIPHERAL VASCULAR BALLOON ANGIOPLASTY;  Surgeon: Melia Lynwood ORN, MD;  Location: [facility];  Service: Cardiovascular;  Laterality: Left;  cephalic arch   REVISON OF ARTERIOVENOUS FISTULA Left 01/24/2023   Procedure: REVISON OF  LEFT ARM ARTERIOVENOUS FISTULA;  Surgeon:  Magda Debby SAILOR, MD;  Location: [facility] OR;  Service: Vascular;  Laterality: Left;   TUMOR REMOVAL     lower back/benign and has come back   VAGINAL HYSTERECTOMY      Family History  Problem Relation Age of Onset   Diabetes Mother    Emphysema Father     Social History   Socioeconomic History   Marital status: Widowed    Spouse name: Not on file   Number of children: 1   Years of education: Not on file   Highest education level: Not on file  Occupational History   Not on file  Tobacco Use   Smoking status: Former    Current packs/day: 0.00    Average packs/day: 1.5 packs/day for 20.0 years (30.0 ttl pk-yrs)    Types: Cigarettes    Start date: 07/02/1972    Quit date: 07/02/1992    Years since quitting: 31.5   Smokeless tobacco: Never  Vaping Use   Vaping status: Never Used  Substance and Sexual Activity   Alcohol  use: No   Drug use: No   Sexual activity: Not on file  Other Topics Concern   Not on file  Social History Narrative   Pt lives [place] [address] [place] senior apartment alone. Daughter lives very close by.   Social Drivers of Corporate investment banker Strain: Not on file  Food Insecurity: No Food Insecurity (12/05/2023)   Hunger Vital Sign    Worried About Running Out of Food in the Last Year: Never true    Ran Out of Food in the Last Year: Never true  Transportation Needs: No Transportation Needs (12/05/2023)   PRAPARE - Administrator, Civil Service (Medical): No    Lack of Transportation (Non-Medical): No  Physical Activity: Not on file  Stress: Not on file  Social Connections: Unknown (12/05/2023)   Social Connection and Isolation Panel    Frequency of Communication with Friends and Family: Patient declined    Frequency of Social Gatherings with Friends and Family: Patient declined    Attends Religious Services: Patient declined    Database administrator or Organizations: Patient declined    Attends Banker Meetings: Patient  declined    Marital Status: Widowed  Intimate Partner Violence: Not At Risk (12/05/2023)   Humiliation, Afraid, Rape, and Kick questionnaire    Fear of Current or Ex-Partner: No    Emotionally Abused: No    Physically Abused: No    Sexually Abused: No    No Known Allergies  Current Outpatient Medications  Medication Sig Dispense Refill   acetaminophen  (TYLENOL ) 500 MG tablet Take 1,000 mg by mouth every 6 (six) hours as needed for mild pain (pain score 1-3) or moderate pain (pain score 4-6).     albuterol  (PROAIR  HFA) 108 (90 Base) MCG/ACT inhaler Inhale 2 puffs into the lungs every 4 hours as needed only  if your can't catch your breath 6.7 g 11   aspirin  EC 81 MG tablet Take 81 mg by mouth daily after supper.     B Complex-C-Zn-Folic Acid  (  DIALYVITE  800 WITH ZINC ) 0.8 MG TABS Take 1 tablet by mouth at bedtime 90 tablet 3   budesonide -formoterol  (SYMBICORT ) 160-4.5 MCG/ACT inhaler Inhale 2 puffs into the lungs 2 (two) times daily. 10.2 g 5   calcium  carbonate (CAL-GEST ANTACID) 500 MG chewable tablet Chew and swallow 3 tablets (600 mg of elemental calcium  total) by mouth daily. (DO NOT TAKE WITH MEALS) (Patient taking differently: Chew 1,500 mg by mouth every Monday, Wednesday, and Friday.) 90 tablet 6   Cholecalciferol (VITAMIN D3 PO) Take 1 capsule by mouth every Monday, Wednesday, and Friday.     colchicine  0.6 MG tablet Take 1 tablet (0.6 mg total) by mouth 2 (two) times daily. (Patient taking differently: Take 0.6 mg by mouth See admin instructions. Take 1 tablet by mouth up to three times a day as needed for gout flare up) 180 tablet 3   doxycycline  (VIBRA -TABS) 100 MG tablet Take 1 tablet (100 mg total) by mouth 2 (two) times daily. 14 tablet 0   Glycerin -Hypromellose-PEG 400 (DRY EYE RELIEF DROPS) 0.2-0.2-1 % SOLN Place 1 drop into both eyes daily as needed (Dry eyes).     guaiFENesin  (MUCINEX ) 600 MG 12 hr tablet Take 1 tablet (600 mg total) by mouth 2 (two) times daily as needed for  to loosen phlegm.     guaiFENesin  (MUCINEX ) 600 MG 12 hr tablet Take 1 tablet (600 mg total) by mouth every 12 (twelve) hours as needed for congestion. 28 tablet 0   lidocaine -prilocaine  (EMLA ) cream Apply small amount to skin /to AVF- prior to dialysis 30 g 11   loratadine  (CLARITIN ) 10 MG tablet Take 10 mg by mouth at bedtime.     midodrine  (PROAMATINE ) 10 MG tablet Take 1 tablet by mouth as directed. Take one tablet 30 mins before each hemodialysis every Mon, Wed, Fri. May take a 2nd dose while on dialysis for SBP<100 30 tablet 11   midodrine  (PROAMATINE ) 10 MG tablet Take 1 tablet by mouth as directed 30 mins before each hemodialysis every Mon, Wed, Fri. May take a 2nd dose while on dialysis for SBP<100 30 tablet 11   OXYGEN  Inhale 2 L into the lungs daily as needed (with sleep and exertion as needed [facility]).     No current facility-administered medications for this visit.    PHYSICAL EXAM Vitals:   01/14/24 1301  BP: (!) 120/55  Pulse: 88  Temp: 97.9 F (36.6 C)  SpO2: (!) 86%  Weight: 146 lb (66.2 kg)  Height: 5' 4 (1.626 m)    Elderly woman in no distress Regular rate and rhythm Unlabored breathing Prominent callus of her bunion in the left medial foot No palpable pedal pulses  PERTINENT LABORATORY AND RADIOLOGIC DATA  Most recent CBC    Latest Ref Rng & Units 12/06/2023    2:28 AM 12/05/2023    4:13 AM 12/04/2023    8:26 PM  CBC  WBC 4.0 - 10.5 K/uL 5.9  5.3  5.6   Hemoglobin 12.0 - 15.0 g/dL 8.4  8.4  8.7   Hematocrit 36.0 - 46.0 % 26.3  26.2  27.8   Platelets 150 - 400 K/uL 186  162  168      Most recent CMP    Latest Ref Rng & Units 12/06/2023    2:28 AM 12/05/2023    4:13 AM 12/04/2023    8:26 PM  CMP  Glucose 70 - 99 mg/dL 94  90  894   BUN 8 - 23 mg/dL 23  14  12   Creatinine 0.44 - 1.00 mg/dL 3.44  5.66  6.31   Sodium 135 - 145 mmol/L 139  138  141   Potassium 3.5 - 5.1 mmol/L 3.4  3.4  3.4   Chloride 98 - 111 mmol/L 97  95  93   CO2 22 - 32 mmol/L 26  28  29     Calcium  8.9 - 10.3 mg/dL 6.3  6.6  7.3   Total Protein 6.5 - 8.1 g/dL  6.8  7.3   Total Bilirubin 0.0 - 1.2 mg/dL  0.7  0.7   Alkaline Phos 38 - 126 U/L  30  29   AST 15 - 41 U/L  31  33   ALT 0 - 44 U/L  17  17     Renal function CrCl cannot be calculated (Patient's most recent lab result is older than the maximum 21 days allowed.).  Hgb A1c MFr Bld (%)  Date Value  06/16/2016 6.1 (H)   +-------+-----------+-----------+------------+------------+  ABI/TBIToday's ABIToday's TBIPrevious ABIPrevious TBI  +-------+-----------+-----------+------------+------------+  Right 0.85       near absent                          +-------+-----------+-----------+------------+------------+  Left  0.74       0.30                                 +-------+-----------+-----------+------------+------------+       Debby SAILOR. Magda, MD [facility] Phone Number: [phone] 01/13/2024 9:25 PM   Total time spent on preparing this encounter including chart review, data review, collecting history, examining the patient, and coordinating care: 45 minutes  Portions of this report may have been transcribed using voice recognition software.  Every effort has been made to ensure accuracy; however, inadvertent computerized transcription errors may still be present.

## 2024-01-14 ENCOUNTER — Ambulatory Visit: Attending: Vascular Surgery | Admitting: Vascular Surgery

## 2024-01-14 ENCOUNTER — Encounter: Payer: Self-pay | Admitting: Vascular Surgery

## 2024-01-14 ENCOUNTER — Other Ambulatory Visit (HOSPITAL_COMMUNITY): Payer: Self-pay

## 2024-01-14 VITALS — BP 120/55 | HR 88 | Temp 97.9°F | Ht 64.0 in | Wt 146.0 lb

## 2024-01-14 DIAGNOSIS — N186 End stage renal disease: Secondary | ICD-10-CM | POA: Diagnosis not present

## 2024-01-14 DIAGNOSIS — I739 Peripheral vascular disease, unspecified: Secondary | ICD-10-CM

## 2024-01-14 DIAGNOSIS — Z992 Dependence on renal dialysis: Secondary | ICD-10-CM

## 2024-01-15 ENCOUNTER — Other Ambulatory Visit: Payer: Self-pay

## 2024-01-15 DIAGNOSIS — I739 Peripheral vascular disease, unspecified: Secondary | ICD-10-CM

## 2024-01-28 ENCOUNTER — Other Ambulatory Visit (HOSPITAL_COMMUNITY): Payer: Self-pay

## 2024-01-31 ENCOUNTER — Other Ambulatory Visit (HOSPITAL_COMMUNITY): Payer: Self-pay

## 2024-01-31 MED ORDER — TRAZODONE HCL 50 MG PO TABS
50.0000 mg | ORAL_TABLET | Freq: Every evening | ORAL | 3 refills | Status: AC | PRN
  Filled 2024-01-31: qty 90, 90d supply, fill #0
  Filled 2024-03-17: qty 90, 90d supply, fill #1

## 2024-02-03 ENCOUNTER — Other Ambulatory Visit (HOSPITAL_COMMUNITY): Payer: Self-pay

## 2024-02-17 ENCOUNTER — Other Ambulatory Visit: Payer: Self-pay

## 2024-02-20 ENCOUNTER — Other Ambulatory Visit: Payer: Self-pay | Admitting: Internal Medicine

## 2024-02-20 ENCOUNTER — Other Ambulatory Visit (HOSPITAL_COMMUNITY): Payer: Self-pay

## 2024-02-20 MED ORDER — BUDESONIDE-FORMOTEROL FUMARATE 160-4.5 MCG/ACT IN AERO
2.0000 | INHALATION_SPRAY | Freq: Two times a day (BID) | RESPIRATORY_TRACT | 5 refills | Status: DC
  Filled 2024-02-20 – 2024-03-17 (×2): qty 10.2, 30d supply, fill #0

## 2024-02-20 NOTE — Telephone Encounter (Signed)
 FYI Only or Action Required?: Action required by provider: medication refill request.  Patient is followed in Pulmonology for Respiratory Failure, COPD, last seen on 01/09/2024 by Neda Jennet LABOR, MD.

## 2024-02-20 NOTE — Telephone Encounter (Signed)
 Copied from CRM [id number]. Topic: Clinical - Medication Refill >> Feb 20, 2024  9:17 AM Russell PARAS wrote: Medication: budesonide -formoterol  (SYMBICORT ) 160-4.5 MCG/ACT inhaler   Has the patient contacted their pharmacy? Yes (Agent: If no, request that the patient contact the pharmacy for the refill. If patient does not wish to contact the pharmacy document the reason why and proceed with request.) (Agent: If yes, when and what did the pharmacy advise?)  This is the patient's preferred pharmacy:  [facility] [address] [place] [address] Phone: [phone] Fax: [phone]  Is this the correct pharmacy for this prescription? Yes If no, delete pharmacy and type the correct one.   Has the prescription been filled recently? Yes  Is the patient out of the medication? No  Has the patient been seen for an appointment in the last year OR does the patient have an upcoming appointment? Yes, last visit on 01/14/24  Can we respond through MyChart? Yes  Agent: Please be advised that Rx refills may take up to 3 business days. We ask that you follow-up with your pharmacy.

## 2024-03-03 ENCOUNTER — Other Ambulatory Visit (HOSPITAL_COMMUNITY): Payer: Self-pay

## 2024-03-03 ENCOUNTER — Encounter: Payer: Self-pay | Admitting: Podiatry

## 2024-03-03 ENCOUNTER — Other Ambulatory Visit: Payer: Self-pay

## 2024-03-03 ENCOUNTER — Ambulatory Visit (INDEPENDENT_AMBULATORY_CARE_PROVIDER_SITE_OTHER): Admitting: Podiatry

## 2024-03-03 DIAGNOSIS — L84 Corns and callosities: Secondary | ICD-10-CM | POA: Diagnosis not present

## 2024-03-03 DIAGNOSIS — I739 Peripheral vascular disease, unspecified: Secondary | ICD-10-CM | POA: Diagnosis not present

## 2024-03-03 DIAGNOSIS — M21612 Bunion of left foot: Secondary | ICD-10-CM | POA: Diagnosis not present

## 2024-03-03 DIAGNOSIS — M2012 Hallux valgus (acquired), left foot: Secondary | ICD-10-CM

## 2024-03-03 MED ORDER — AMMONIUM LACTATE 12 % EX CREA
1.0000 | TOPICAL_CREAM | CUTANEOUS | 0 refills | Status: AC | PRN
  Filled 2024-03-03: qty 385, 30d supply, fill #0

## 2024-03-04 NOTE — Progress Notes (Signed)
 Subjective: Chief Complaint  Patient presents with   Foot Pain    Here for callus L meta medial. Pain is a constant ache 8.  4 months Non Diabetic No anti coag     88 year old female presents the office today with concerns of a painful callus, bunion on her left foot.  She said the area is tender with pressure.  No recent injury or changes.  She is followed with vascular surgery as well.  No open lesions.    Objective: AAO x3, NAD DP/PT pulses decreased Bunion is present.  There is callus formation around the area of the bunion.  Once I debrided this there was no underlying ulceration, drainage or signs of infection.  There are some localized edema present but there is no open lesions.  Edema is less than what it was previously.  There is no erythema or warmth.  There is no fluctuation or crepitation.  There is no drainable collection noted. No pain with calf compression, swelling, warmth, erythema  Assessment: Bunion, capsulitis; PAD  Plan: Bunion -Patient was to proceed with getting rid of the bunion but given her circulatory status I did advise against any invasive procedures.  I do think given this continued conservative management would be the best option for her.   Continue offloading pads and shoes to avoid excess pressure.  Monitor closely for any signs or symptoms of infection under skin breakdown.  Pre-ulcerative callus; PAD -Sharply debrided the hyperkeratotic lesion x 1 without any complications or bleeding -Discussed offloading and topical moisturizer -Monitor for skin breakdown   Return in about 2 months (around 05/03/2024) for callus left foot, PAD.  Mallory Chavez Fees DPM

## 2024-03-05 ENCOUNTER — Other Ambulatory Visit: Payer: Self-pay

## 2024-03-17 ENCOUNTER — Other Ambulatory Visit: Payer: Self-pay

## 2024-03-17 ENCOUNTER — Other Ambulatory Visit (HOSPITAL_COMMUNITY): Payer: Self-pay

## 2024-04-02 ENCOUNTER — Encounter: Payer: Self-pay | Admitting: Internal Medicine

## 2024-04-02 ENCOUNTER — Other Ambulatory Visit (HOSPITAL_COMMUNITY): Payer: Self-pay

## 2024-04-02 ENCOUNTER — Ambulatory Visit: Admitting: Internal Medicine

## 2024-04-02 ENCOUNTER — Ambulatory Visit

## 2024-04-02 VITALS — BP 112/67 | HR 76 | Temp 98.7°F | Ht 64.0 in | Wt 139.0 lb

## 2024-04-02 DIAGNOSIS — J449 Chronic obstructive pulmonary disease, unspecified: Secondary | ICD-10-CM | POA: Diagnosis not present

## 2024-04-02 DIAGNOSIS — J9611 Chronic respiratory failure with hypoxia: Secondary | ICD-10-CM | POA: Diagnosis not present

## 2024-04-02 MED ORDER — BREZTRI AEROSPHERE 160-9-4.8 MCG/ACT IN AERO
INHALATION_SPRAY | RESPIRATORY_TRACT | 11 refills | Status: AC
Start: 2024-04-02 — End: ?
  Filled 2024-04-02: qty 10.7, 30d supply, fill #0
  Filled 2024-05-11: qty 10.7, 30d supply, fill #1
  Filled 2024-06-17: qty 10.7, 30d supply, fill #2

## 2024-04-02 NOTE — Progress Notes (Signed)
 Subjective:   Patient ID: Mallory Chavez, female    DOB: 08/09/1935 MRN: [id number]   Brief patient profile:  89 yobf quit smoking around 1994 bothered by some seasonal rhinitis spring > fall x around 1990 then 2012 developed breathing problems dx asthma variably responsive to rx referred 10/01/2012 to pulmonary clinic by Dr Shelda and proved to have GOLD II COPD 10/2012    History of Present Illness  10/01/2012 1st pulmonary /Lamel Mccarley  On ACEi cc abruptly 2012 shortness of breath assoc with cough min productive mostly clear. Sob x across a parking lot. Not responding to multiple asthma meds. rec benicar  40/25 one half daily in place of lisnopril GERD diet    02/06/2018  f/u ov/Deonta Bomberger re:  GOLD II/ 02 2lpm hs and prn daytime  Chief Complaint  Patient presents with   Follow-up    Increased SOB x 1 month. She has been using her albuterol  inhaler 2 x daily on average.   Dyspnea:  Uses hc parking / struggles to do [employer] on 2lpm. Gradually worse since weather turned hot  Cough: none  SABA use: as above  02: 2lpm does not titrate  rec No change in pulmonary medications Adjust your 02 to keep your saturations above 90% at all times Please schedule a follow up visit in 3 months but call sooner if needed  - add:  Echo needed to r/o cor pulmonale  - Echo 02/14/2018 - Left ventricle: The cavity size was normal. Wall thickness was   increased in a pattern of mild LVH. Systolic function was mildly   reduced. The estimated ejection fraction was in the range of 45%   to 50%. Diffuse hypokinesis. Doppler parameters are consistent   with abnormal left ventricular relaxation (grade 1 diastolic   dysfunction). - Aortic valve: Trileaflet; mildly thickened, mildly calcified   leaflets. - Left atrium: The atrium was mildly dilated. - Pulmonary arteries: Systolic pressure was moderately increased.   PA peak pressure: 48 mm Hg (S).      11/14/2023  f/u ov/Lulu Hirschmann re: GOLD 2 / HD    maint on  symbicort  160   Chief Complaint  Patient presents with   Follow-up    COPD f/u  Dyspnea:  walking with rollator / [employer] ok sometimes [employer] on RA Cough: none  Sleeping: one brick under each headboard resp cc  SABA use: rarely  02: noct  2lpm hs and no need daytime at all  Rec Work on inhaler technique:    Please schedule a follow up visit in 12 months but call sooner if needed  with all medications /inhalers/ solutions in hand   04/02/2024  f/u ov/Justice Milliron re: GOLD 2  COPD / HD  MWF    maint on symbicort  160   Chief Complaint  Patient presents with   Medical Management of Chronic Issues    Cpod gold 2  Dyspnea:  doing hallway walkway and at HD center  long way  on 2lpm POC not checking sats  Cough: none  Sleeping: brick under headboard and one bid pillow  resp cc  SABA use:  rarely needed hfa / neb once a day  02: 2lpm HA    No obvious day to day or daytime variability or assoc excess/ purulent sputum or mucus plugs or hemoptysis or cp or chest tightness, subjective wheeze or overt sinus or hb symptoms.    Also denies any obvious fluctuation of symptoms with weather or environmental changes or other  aggravating or alleviating factors except as outlined above   No unusual exposure hx or h/o childhood pna/ asthma or knowledge of premature birth.  Current Allergies, Complete Past Medical History, Past Surgical History, Family History, and Social History were reviewed in [employer] record.  ROS  The following are not active complaints unless bolded Hoarseness, sore throat, dysphagia, dental problems, itching, sneezing,  nasal congestion or discharge of excess mucus or purulent secretions, ear ache,   fever, chills, sweats, unintended wt loss or wt gain, classically pleuritic or exertional cp,  orthopnea pnd or arm/hand swelling  or leg swelling, presyncope, palpitations, abdominal pain, anorexia, nausea, vomiting, diarrhea  or change in bowel habits or change  in bladder habits, change in stools or change in urine, dysuria, hematuria,  rash, arthralgias, visual complaints, headache, numbness, weakness or ataxia or problems with walking or coordination,  change in mood or  memory.        Current Meds  Medication Sig   acetaminophen  (TYLENOL ) 500 MG tablet Take 1,000 mg by mouth every 6 (six) hours as needed for mild pain (pain score 1-3) or moderate pain (pain score 4-6).   albuterol  (PROAIR  HFA) 108 (90 Base) MCG/ACT inhaler Inhale 2 puffs into the lungs every 4 hours as needed only  if your can't catch your breath   ammonium lactate  (AMLACTIN) 12 % cream Apply 1 Application topically as needed for dry skin.   aspirin  EC 81 MG tablet Take 81 mg by mouth daily after supper.   B Complex-C-Zn-Folic Acid  (DIALYVITE  800 WITH ZINC ) 0.8 MG TABS Take 1 tablet by mouth at bedtime   budesonide -formoterol  (SYMBICORT ) 160-4.5 MCG/ACT inhaler Inhale 2 puffs into the lungs 2 (two) times daily.   calcium  carbonate (CAL-GEST ANTACID) 500 MG chewable tablet Chew and swallow 3 tablets (600 mg of elemental calcium  total) by mouth daily. (DO NOT TAKE WITH MEALS) (Patient taking differently: Chew 1,500 mg by mouth every Monday, Wednesday, and Friday.)   Cholecalciferol (VITAMIN D3 PO) Take 1 capsule by mouth every Monday, Wednesday, and Friday.   colchicine  0.6 MG tablet Take 1 tablet (0.6 mg total) by mouth 2 (two) times daily. (Patient taking differently: Take 0.6 mg by mouth See admin instructions. Take 1 tablet by mouth up to three times a day as needed for gout flare up)   doxycycline  (VIBRA -TABS) 100 MG tablet Take 1 tablet (100 mg total) by mouth 2 (two) times daily.   Glycerin -Hypromellose-PEG 400 (DRY EYE RELIEF DROPS) 0.2-0.2-1 % SOLN Place 1 drop into both eyes daily as needed (Dry eyes).   guaiFENesin  (MUCINEX ) 600 MG 12 hr tablet Take 1 tablet (600 mg total) by mouth 2 (two) times daily as needed for to loosen phlegm.   guaiFENesin  (MUCINEX ) 600 MG 12 hr tablet  Take 1 tablet (600 mg total) by mouth every 12 (twelve) hours as needed for congestion.   lidocaine -prilocaine  (EMLA ) cream Apply small amount to skin /to AVF- prior to dialysis   loratadine  (CLARITIN ) 10 MG tablet Take 10 mg by mouth at bedtime.   midodrine  (PROAMATINE ) 10 MG tablet Take 1 tablet by mouth as directed. Take one tablet 30 mins before each hemodialysis every Mon, Wed, Fri. May take a 2nd dose while on dialysis for SBP<100   midodrine  (PROAMATINE ) 10 MG tablet Take 1 tablet by mouth as directed 30 mins before each hemodialysis every Mon, Wed, Fri. May take a 2nd dose while on dialysis for SBP<100   OXYGEN  Inhale 2 L into the  lungs daily as needed (with sleep and exertion as needed [facility]).   traZODone  (DESYREL ) 50 MG tablet Take 1 tablet (50 mg total) by mouth at bedtime as needed.            Objective:   Physical Exam  Wts  04/02/2024      139   11/14/2023      152  01/17/2023      150  09/26/2021      142  03/30/2021      150  02/23/2019     166  11/10/2012       206 vs 12/19/2012 206  Vs 207 01/16/2013 > 04/27/2013  210 > 06/15/2013  208  >211 07/27/2013 > 04/19/14 201 >203 05/10/2014 > 07/05/2014 201 >10/07/2014 200>  01/10/2015   195 > 09/13/2015  193 > 03/15/2016  192  > 12/20/2016 185 > 08/11/2017  189 > 02/06/2018  186 >  05/19/2018  177 > 08/25/2018   170    Vital signs reviewed  04/02/2024  - Note at rest 02 sats  97% on RA   General appearance:    amb bf using rollator    HEENT : Oropharynx  clear   Nasal turbinates nl    NECK :  without  apparent JVD/ palpable Nodes/TM    LUNGS: no acc muscle use,  Mild barrel/mod kyphotic   contour chest wall with bilateral  Distant bs s audible wheeze and  without cough on insp or exp maneuvers  and mild  Hyperresonant  to  percussion bilaterally     CV:  RRR  no s3 or murmur or increase in P2, and no edema   ABD:  soft and nontender   MS:  Nl gait/ ext warm without deformities Or obvious joint restrictions  calf tenderness, cyanosis or  clubbing     SKIN: warm and dry without lesions    NEURO:  alert, approp, nl sensorium with  no motor or cerebellar deficits apparent.    CXR PA and Lateral:   04/02/2024 :    I personally reviewed images and impression is as follows:     Mild copd /mod kyposis/ no acute findings  Assessment & Plan:    Assessment & Plan COPD mixed type (HCC) GOLD 2 COPD Quit smoking  1994 - PFT's 11/10/2012 FEV1 0.85 (52%)ratio 56 and no better p B 2 with DLCO 34 corrects to 60 - doe x 5-10 min stationery bike > add tudorza 11/10/12 > d/c 05/2013 and no change - PFT's  05/19/2018  FEV1 0.85 (56 % ) ratio 58  p 8 % improvement from saba p symb  prior to study with DLCO  27 % corrects to 47   % for alv volume  And ERV = 0.22  - 09/26/2021   continue symbicort  160 2bid  - 04/02/2024  After extensive coaching inhaler device,  effectiveness =    80% hfa > try breztri if covered by insurance       Chronic respiratory failure with hypoxia (HCC) Onset 2015 04/19/14 walked one lap dropped to 80% required 4lpm with walking to maintain sats  ono RA 04/21/14 desat x 7 h x 12 m at < 89%  04/26/2014 rec 2lpm and repeat ono on 2lpm  - 01/10/2015  Walked 2lpmx 2 laps @ 185 ft each stopped due to hip pain, slow pace/ no sob or desat  -Patient Saturations on Room Air at Rest = 95% Patient Saturations on [employer] while Ambulating =  87% Patient Saturations on 2 Liters of oxygen  while Ambulating = 97% - 05/19/2018   Walked 2lpm POC x one lap @ 210 ft  stopped due to sob s desats   - 02/23/2019   Walked RA x one lap =  approx 250 ft - stopped due to sob with sats 84% at slow pace though RA sats at rest ok  - ONO on 1lpm  03/08/19  desat x 24 sec only > continue 1 lpm  - 03/30/2021   Walked on RA x  3/4  lap(s) =  approx 200 @ slow pace, stopped due to sob with lowest 02 sats 88%    Again advised goal of 02 rx is > 90% while walking          Each maintenance medication was reviewed in detail including emphasizing most  importantly the difference between maintenance and prns and under what circumstances the prns are to be triggered using an action plan format where appropriate.  Total time for H and P, chart review, counseling, reviewing hfa/ neb/ pulse ox /02  device(s) and generating customized AVS unique to this office visit / same day charting = 32 min            AVS  Patient Instructions  Make sure you check your oxygen  saturation  AT  your highest level of activity (not after you stop)   to be sure it stays over 90% and adjust  02 flow upward to maintain this level if needed but remember to turn it back to previous settings when you stop (to conserve your supply).   Plan A = Automatic = Always=    Breztri (or symbicort  160) Take 2 puffs first thing in am and then another 2 puffs about 12 hours later.    Work on inhaler technique:  relax and gently blow all the way out then take a nice smooth full deep breath back in, triggering the inhaler at same time you start breathing in.  Hold breath in for at least  5 seconds if you can. Blow out symbicort  or breztri  thru nose. Rinse and gargle with water when done.  If mouth or throat bother you at all,  try brushing teeth/gums/tongue with arm and hammer toothpaste/ make a slurry and gargle and spit out.       Plan B = Backup (to supplement plan A, not to replace it) Use your albuterol  inhaler as a rescue medication to be used if you can't catch your breath by resting or slowing your pace  or doing a relaxed purse lip breathing pattern.  - The less you use it, the better it will work when you need it. - Ok to use the inhaler up to 2 puffs  every 4 hours if you must but call for appointment if use goes up over your usual need - Don't leave home without it !!  (think of it like the spare tire or starter fluid for your car)   Plan C = Crisis (instead of Plan B but only if Plan B stops working) - only use your albuterol  nebulizer if you first try Plan B and it  fails to help > ok to use the nebulizer up to every 4 hours but if start needing it regularly call for immediate appointment    Please remember to go to the  x-ray department  for your tests - we will call you with the results when they are available    Please  schedule a follow up visit in 3 months but call sooner if needed        Ozell America, MD 04/04/2024

## 2024-04-02 NOTE — Patient Instructions (Addendum)
 Make sure you check your oxygen  saturation  AT  your highest level of activity (not after you stop)   to be sure it stays over 90% and adjust  02 flow upward to maintain this level if needed but remember to turn it back to previous settings when you stop (to conserve your supply).   Plan A = Automatic = Always=    Breztri (or symbicort  160) Take 2 puffs first thing in am and then another 2 puffs about 12 hours later.    Work on inhaler technique:  relax and gently blow all the way out then take a nice smooth full deep breath back in, triggering the inhaler at same time you start breathing in.  Hold breath in for at least  5 seconds if you can. Blow out symbicort  or breztri  thru nose. Rinse and gargle with water when done.  If mouth or throat bother you at all,  try brushing teeth/gums/tongue with arm and hammer toothpaste/ make a slurry and gargle and spit out.       Plan B = Backup (to supplement plan A, not to replace it) Use your albuterol  inhaler as a rescue medication to be used if you can't catch your breath by resting or slowing your pace  or doing a relaxed purse lip breathing pattern.  - The less you use it, the better it will work when you need it. - Ok to use the inhaler up to 2 puffs  every 4 hours if you must but call for appointment if use goes up over your usual need - Don't leave home without it !!  (think of it like the spare tire or starter fluid for your car)   Plan C = Crisis (instead of Plan B but only if Plan B stops working) - only use your albuterol  nebulizer if you first try Plan B and it fails to help > ok to use the nebulizer up to every 4 hours but if start needing it regularly call for immediate appointment    Please remember to go to the  x-ray department  for your tests - we will call you with the results when they are available    Please schedule a follow up visit in 3 months but call sooner if needed

## 2024-04-04 NOTE — Assessment & Plan Note (Addendum)
 GOLD 2 COPD Quit smoking  1994 - PFT's 11/10/2012 FEV1 0.85 (52%)ratio 56 and no better p B 2 with DLCO 34 corrects to 60 - doe x 5-10 min stationery bike > add tudorza 11/10/12 > d/c 05/2013 and no change - PFT's  05/19/2018  FEV1 0.85 (56 % ) ratio 58  p 8 % improvement from saba p symb  prior to study with DLCO  27 % corrects to 47   % for alv volume  And ERV = 0.22  - 09/26/2021   continue symbicort  160 2bid  - 04/02/2024  After extensive coaching inhaler device,  effectiveness =    80% hfa > try breztri if covered by insurance

## 2024-04-04 NOTE — Assessment & Plan Note (Addendum)
 Onset 2015 04/19/14 walked one lap dropped to 80% required 4lpm with walking to maintain sats  ono RA 04/21/14 desat x 7 h x 12 m at < 89%  04/26/2014 rec 2lpm and repeat ono on 2lpm  - 01/10/2015  Walked 2lpmx 2 laps @ 185 ft each stopped due to hip pain, slow pace/ no sob or desat  -Patient Saturations on Room Air at Rest = 95% Patient Saturations on [employer] while Ambulating = 87% Patient Saturations on 2 Liters of oxygen  while Ambulating = 97% - 05/19/2018   Walked 2lpm POC x one lap @ 210 ft  stopped due to sob s desats   - 02/23/2019   Walked RA x one lap =  approx 250 ft - stopped due to sob with sats 84% at slow pace though RA sats at rest ok  - ONO on 1lpm  03/08/19  desat x 24 sec only > continue 1 lpm  - 03/30/2021   Walked on RA x  3/4  lap(s) =  approx 200 @ slow pace, stopped due to sob with lowest 02 sats 88%    Again advised goal of 02 rx is > 90% while walking          Each maintenance medication was reviewed in detail including emphasizing most importantly the difference between maintenance and prns and under what circumstances the prns are to be triggered using an action plan format where appropriate.  Total time for H and P, chart review, counseling, reviewing hfa/ neb/ pulse ox /02  device(s) and generating customized AVS unique to this office visit / same day charting = 32 min

## 2024-04-07 ENCOUNTER — Ambulatory Visit: Payer: Self-pay | Admitting: Internal Medicine

## 2024-04-08 NOTE — Progress Notes (Signed)
 Spoke with pt regarding cxr results, pt confirmed understanding.

## 2024-04-13 ENCOUNTER — Other Ambulatory Visit (HOSPITAL_COMMUNITY): Payer: Self-pay

## 2024-05-07 ENCOUNTER — Ambulatory Visit: Admitting: Podiatry

## 2024-05-11 ENCOUNTER — Other Ambulatory Visit (HOSPITAL_COMMUNITY): Payer: Self-pay

## 2024-06-04 ENCOUNTER — Other Ambulatory Visit (HOSPITAL_COMMUNITY): Payer: Self-pay

## 2024-06-04 ENCOUNTER — Other Ambulatory Visit (HOSPITAL_BASED_OUTPATIENT_CLINIC_OR_DEPARTMENT_OTHER): Payer: Self-pay

## 2024-06-09 ENCOUNTER — Ambulatory Visit: Admitting: Podiatry

## 2024-06-11 ENCOUNTER — Other Ambulatory Visit: Payer: Self-pay

## 2024-06-11 ENCOUNTER — Encounter (HOSPITAL_COMMUNITY): Payer: Self-pay | Admitting: Surgery

## 2024-06-11 ENCOUNTER — Ambulatory Visit (HOSPITAL_COMMUNITY): Admission: RE | Admit: 2024-06-11 | Discharge: 2024-06-11 | Disposition: A | Attending: Surgery | Admitting: Surgery

## 2024-06-11 ENCOUNTER — Encounter (HOSPITAL_COMMUNITY): Admission: RE | Disposition: A | Payer: Self-pay | Attending: Surgery

## 2024-06-11 DIAGNOSIS — Z992 Dependence on renal dialysis: Secondary | ICD-10-CM | POA: Diagnosis not present

## 2024-06-11 DIAGNOSIS — I132 Hypertensive heart and chronic kidney disease with heart failure and with stage 5 chronic kidney disease, or end stage renal disease: Secondary | ICD-10-CM | POA: Diagnosis not present

## 2024-06-11 DIAGNOSIS — J9610 Chronic respiratory failure, unspecified whether with hypoxia or hypercapnia: Secondary | ICD-10-CM | POA: Diagnosis not present

## 2024-06-11 DIAGNOSIS — E7800 Pure hypercholesterolemia, unspecified: Secondary | ICD-10-CM | POA: Diagnosis not present

## 2024-06-11 DIAGNOSIS — N186 End stage renal disease: Secondary | ICD-10-CM

## 2024-06-11 DIAGNOSIS — T82858A Stenosis of vascular prosthetic devices, implants and grafts, initial encounter: Secondary | ICD-10-CM | POA: Diagnosis not present

## 2024-06-11 DIAGNOSIS — Z87891 Personal history of nicotine dependence: Secondary | ICD-10-CM | POA: Diagnosis not present

## 2024-06-11 DIAGNOSIS — J4489 Other specified chronic obstructive pulmonary disease: Secondary | ICD-10-CM | POA: Diagnosis not present

## 2024-06-11 DIAGNOSIS — I509 Heart failure, unspecified: Secondary | ICD-10-CM | POA: Diagnosis not present

## 2024-06-11 DIAGNOSIS — Y832 Surgical operation with anastomosis, bypass or graft as the cause of abnormal reaction of the patient, or of later complication, without mention of misadventure at the time of the procedure: Secondary | ICD-10-CM | POA: Diagnosis not present

## 2024-06-11 HISTORY — PX: A/V FISTULAGRAM: CATH118298

## 2024-06-11 HISTORY — PX: VENOUS ANGIOPLASTY: CATH118376

## 2024-06-11 SURGERY — A/V FISTULAGRAM
Anesthesia: LOCAL | Site: Arm Upper | Laterality: Left

## 2024-06-11 MED ORDER — IODIXANOL 320 MG/ML IV SOLN
INTRAVENOUS | Status: DC | PRN
  Administered 2024-06-11: 40 mL via INTRAVENOUS

## 2024-06-11 MED ORDER — LIDOCAINE HCL (PF) 1 % IJ SOLN
INTRAMUSCULAR | Status: DC | PRN
  Administered 2024-06-11: 5 mL

## 2024-06-11 MED ORDER — LIDOCAINE HCL (PF) 1 % IJ SOLN
INTRAMUSCULAR | Status: AC
  Filled 2024-06-11: qty 30

## 2024-06-11 MED ORDER — HEPARIN (PORCINE) IN NACL 1000-0.9 UT/500ML-% IV SOLN
INTRAVENOUS | Status: DC | PRN
  Administered 2024-06-11: 500 mL

## 2024-06-11 SURGICAL SUPPLY — 10 items
BALLOON MUSTANG 8.0X40 75 (BALLOONS) IMPLANT
BALLOON MUSTANG 8X100X75 (BALLOONS) IMPLANT
GLIDEWIRE ADV .035X180CM (WIRE) IMPLANT
KIT ENCORE 26 ADVANTAGE (KITS) IMPLANT
KIT MICROPUNCTURE NIT STIFF (SHEATH) IMPLANT
KIT PV (KITS) ×2 IMPLANT
SHEATH PINNACLE R/O II 6F 4CM (SHEATH) IMPLANT
SHEATH PROBE COVER 6X72 (BAG) IMPLANT
TRAY PV CATH (CUSTOM PROCEDURE TRAY) ×2 IMPLANT
TUBING CIL FLEX 10 FLL-RA (TUBING) IMPLANT

## 2024-06-11 NOTE — Op Note (Signed)
° ° °  Patient name: Mallory Chavez MRN: [id number] DOB: [birth year] Sex: female  06/11/2024 Pre-operative Diagnosis: ESRD Post-operative diagnosis:  Same Surgeon:  Malvina New Procedure Performed:  1.  Ultrasound-guided access, left cephalic vein  2.  Fistulogram  3.  Central balloon venoplasty (subclavian vein)  4.  Peripheral balloon venoplasty (cephalic vein)    Indications: This is an 88 year old female with end-stage renal disease who is having issues with access including bleeding and pain.  She comes in today for further evaluation and possible intervention.  Procedure:  The patient was identified in the holding area and taken to room 8.  The patient was then placed supine on the table and prepped and draped in the usual sterile fashion.  A time out was called.  Ultrasound was used to evaluate the fistula.  The vein was patent and compressible.  A digital ultrasound image was acquired.  The fistula was then accessed under ultrasound guidance using a micropuncture needle.  An 018 wire was then asvanced without resistance and a micropuncture sheath was placed.  Contrast injections were then performed through the sheath.  Findings: Central venous system is widely patent however there is a stent going from the cephalic vein into the subclavian vein where there is approximately an 80% stenosis in the subclavian vein.  The stent also has a leading edge stenosis of approximately 90% within the cephalic vein.  The remaining portion of the fistula is widely patent as is the arterial venous anastomosis   Intervention: After the above images were acquired the decision was made to proceed with intervention.  A Glidewire advantage was inserted and a 6 French sheath was placed.  I selected an 8 x 100 balloon to treat the central venous stenosis during revealed resolution of the stenosis to less than 15%.  There was some haziness within the stent and so I reinserted an 8 x 40 balloon to address this.   Completion imaging showed widely patent fistula.  There was an excellent thrill.  The sheath was removed with a Monocryl suture to close the access site.  There were no complications.  Impression:  #1  Successful balloon venoplasty of a peripheral and central venous stenosis using an 8 mm balloon  #2  Access remains amenable to future percutaneous interventions   V. Malvina New, M.D., [facility] [place] Office: [phone] Pager:  [phone]

## 2024-06-11 NOTE — H&P (Signed)
 [facility] [place]  Patient name: Mallory Chavez MRN: [id number] DOB: 05/13/1936 Sex: female   REASON FOR VISIT:    Dialysis access evaluation  HISOTRY OF PRESENT ILLNESS:    Mallory Chavez is a 88 y.o. female with of a left brachiocephalic fistula creation in 2020.  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  In 2025 she had a fistulogram which showed stenosis within the cephalic vein at her previously placed stent.  While at dialysis, she has been having difficulty with bleeding as well as cannulation.  She is also having pain from the upper needle.  She suffers from congestive heart failure.  She has COPD from former smoking.  She is medically managed for hypertension and hypercholesterolemia   PAST MEDICAL HISTORY:   Past Medical History:  Diagnosis Date   Allergy    Arthritis    Asthma    CHF (congestive heart failure) (HCC) 1980   controlled with meds   Chronic respiratory failure (HCC)    COPD (chronic obstructive pulmonary disease) (HCC)    ESRD on hemodialysis (HCC)    MWF at [facility]   GERD (gastroesophageal reflux disease)    Hyperglycemia    Hyperlipidemia    Hypertension    Hyponatremia      FAMILY HISTORY:   Family History  Problem Relation Age of Onset   Diabetes Mother    Emphysema Father     SOCIAL HISTORY:   Social History   Tobacco Use   Smoking status: Former    Current packs/day: 0.00    Average packs/day: 1.5 packs/day for 20.0 years (30.0 ttl pk-yrs)    Types: Cigarettes    Start date: 07/02/1972    Quit date: 07/02/1992    Years since quitting: 31.9   Smokeless tobacco: Never  Substance Use Topics   Alcohol  use: No     ALLERGIES:   Allergies[1]   CURRENT MEDICATIONS:   No current facility-administered medications for this encounter.    REVIEW OF SYSTEMS:   [X]  denotes positive finding, [ ]  denotes negative finding Cardiac  Comments:   Chest pain or chest pressure:    Shortness of breath upon exertion:    Short of breath when lying flat:    Irregular heart rhythm:        Vascular    Pain in calf, thigh, or hip brought on by ambulation:    Pain in feet at night that wakes you up from your sleep:     Blood clot in your veins:    Leg swelling:         Pulmonary    Oxygen  at home:    Productive cough:     Wheezing:         Neurologic    Sudden weakness in arms or legs:     Sudden numbness in arms or legs:     Sudden onset of difficulty speaking or slurred speech:    Temporary loss of vision in one eye:     Problems with dizziness:         Gastrointestinal    Blood in stool:     Vomited blood:         Genitourinary    Burning when urinating:     Blood in urine:        Psychiatric    Major depression:         Hematologic    Bleeding problems:  Problems with blood clotting too easily:        Skin    Rashes or ulcers:        Constitutional    Fever or chills:      PHYSICAL EXAM:   Vitals:   06/11/24 0756  BP: (!) 146/60  Pulse: 86  Resp: 20  Temp: 98 F (36.7 C)  TempSrc: Oral  SpO2: 100%    GENERAL: The patient is a well-nourished female, in no acute distress. The vital signs are documented above. CARDIAC: There is a regular rate and rhythm.  VASCULAR: Pulsatility to the left brachiocephalic fistula without evidence of ulceration or skin breakdown PULMONARY: Non-labored respirations NEUROLOGIC: No focal weakness or paresthesias are detected. SKIN: There are no ulcers or rashes noted. PSYCHIATRIC: The patient has a normal affect.  STUDIES:     MEDICAL ISSUES:   ESRD: The patient has a left brachiocephalic fistula that is causing her pain at cannulation and during dialysis.  She is also having trouble with bleeding after the needles are removed.  On exam there is pulsatility to the fistula.  I have recommended that we proceed with a left arm fistulogram to better evaluate her  access and to treat any potential stenosis.  The risks benefits the procedure discussed with the patient.  All questions were answered and she wishes to proceed.    [name], MD, FACS [facility] [phone] Pager [phone]     [1] No Known Allergies

## 2024-06-17 ENCOUNTER — Other Ambulatory Visit (HOSPITAL_COMMUNITY): Payer: Self-pay

## 2024-06-30 ENCOUNTER — Ambulatory Visit (HOSPITAL_COMMUNITY)

## 2024-07-01 ENCOUNTER — Ambulatory Visit (HOSPITAL_COMMUNITY)
Admission: RE | Admit: 2024-07-01 | Discharge: 2024-07-01 | Disposition: A | Discharge status: Home / Self Care | Source: Ambulatory Visit | Attending: Vascular Surgery | Admitting: Vascular Surgery

## 2024-07-01 DIAGNOSIS — I739 Peripheral vascular disease, unspecified: Secondary | ICD-10-CM | POA: Insufficient documentation

## 2024-07-01 LAB — VAS US ABI WITH/WO TBI
Left ABI: 0.79
Right ABI: 1.02

## 2024-07-06 NOTE — Progress Notes (Unsigned)
 [facility] [place]  ASSESSMENT / PLAN: Mallory Chavez is a 89 y.o. female with atherosclerosis of native arteries of left lower extremity.  Recommend:  Abstinence from all tobacco products. Blood glucose control with goal A1c < 7%. Blood pressure control with goal blood pressure < 130/80 mmHg. Lipid reduction therapy with goal LDL-C < 55 mg/dL. Aspirin  81mg  by mouth daily. Atorvastatin 40-80mg  PO QD (or other high intensity statin therapy).  She does have a prominent callus on a bunion on her left first metatarsal joint which she says is getting better with conservative therapy.  I reviewed the options going forward including aggressive management with angiography.  We had a detailed discussion of the risk, benefits, and alternatives.  She and I agreed to avoid aggressive intervention for now.  She knows we will need to be aggressive if this deteriorates into an ulcer or if she develops gangrenous skin changes.  CHIEF COMPLAINT: Prominent callus over left foot  HISTORY OF PRESENT ILLNESS: Mallory Chavez is a 89 y.o. female referred to clinic for evaluation of peripheral arterial disease.  The patient is well-known to me, having undergone dialysis access surgery before with me.  Patient describes a painful left great toe.  She saw her podiatrist, Dr. Gershon, for the same.  The patient was prescribed offloading and careful wound care and an ABI was ordered.  This did show decreased ankle-brachial index, and so she was referred for further evaluation.  The patient is a chronically ill elderly woman on home oxygen .  I do not think she walks fast or far enough to claudicate.  She does not describe classic ischemic rest pain symptoms.  She does have a prominent callus on a bunion on her left first metatarsal joint which she says is getting better with conservative therapy.  I reviewed the options going forward including aggressive management with angiography.  We had a  detailed discussion of the risk, benefits, and alternatives.  She and I agreed to avoid aggressive intervention for now.   Past Medical History:  Diagnosis Date   Allergy    Arthritis    Asthma    CHF (congestive heart failure) (HCC) 1980   controlled with meds   Chronic respiratory failure (HCC)    COPD (chronic obstructive pulmonary disease) (HCC)    ESRD on hemodialysis (HCC)    MWF at [facility]   GERD (gastroesophageal reflux disease)    Hyperglycemia    Hyperlipidemia    Hypertension    Hyponatremia     Past Surgical History:  Procedure Laterality Date   A/V FISTULAGRAM N/A 09/03/2023   Procedure: A/V Fistulagram;  Surgeon: Melia Lynwood ORN, MD;  Location: [facility];  Service: Cardiovascular;  Laterality: N/A;   A/V FISTULAGRAM Left 06/11/2024   Procedure: A/V Fistulagram;  Surgeon: Serene Gaile ORN, MD;  Location: [facility];  Service: Cardiovascular;  Laterality: Left;   AV FISTULA PLACEMENT Left 07/23/2018   Procedure: ARTERIOVENOUS (AV) FISTULA CREATION;  Surgeon: Sheree Penne Bruckner, MD;  Location: [facility];  Service: Vascular;  Laterality: Left;   CATARACT EXTRACTION Bilateral    2024   ECTOPIC PREGNANCY SURGERY  1962   INSERTION OF DIALYSIS CATHETER Right 07/23/2018   Procedure: INSERTION OF DIALYSIS CATHETER;  Surgeon: Sheree Penne Bruckner, MD;  Location: [facility] OR;  Service: Vascular;  Laterality: Right;   INSERTION OF DIALYSIS CATHETER Right 01/24/2023   Procedure: ULTRASOUND GUIDED INSERTION OF DIALYSIS CATHETER;  Surgeon: Magda Debby SAILOR, MD;  Location: [facility] OR;  Service: Vascular;  Laterality: Right;   PARATHYROID  EXPLORATION N/A 01/25/2022   Procedure: NECK EXPLORATION WITH PARATHYROIDECTOMY;  Surgeon: Eletha Boas, MD;  Location: [facility] OR;  Service: General;  Laterality: N/A;   PERIPHERAL VASCULAR BALLOON ANGIOPLASTY Left 09/03/2023   Procedure: PERIPHERAL VASCULAR BALLOON ANGIOPLASTY;  Surgeon: Melia Lynwood ORN, MD;  Location: [facility];  Service:  Cardiovascular;  Laterality: Left;  cephalic arch   REVISON OF ARTERIOVENOUS FISTULA Left 01/24/2023   Procedure: REVISON OF  LEFT ARM ARTERIOVENOUS FISTULA;  Surgeon: Magda Debby SAILOR, MD;  Location: [facility] OR;  Service: Vascular;  Laterality: Left;   TUMOR REMOVAL     lower back/benign and has come back   VAGINAL HYSTERECTOMY     VENOUS ANGIOPLASTY  06/11/2024   Procedure: VENOUS ANGIOPLASTY;  Surgeon: Serene Gaile ORN, MD;  Location: [facility];  Service: Cardiovascular;;  subclavian 80%  cephalic 90%    Family History  Problem Relation Age of Onset   Diabetes Mother    Emphysema Father     Social History   Socioeconomic History   Marital status: Widowed    Spouse name: Not on file   Number of children: 1   Years of education: Not on file   Highest education level: Not on file  Occupational History   Not on file  Tobacco Use   Smoking status: Former    Current packs/day: 0.00    Average packs/day: 1.5 packs/day for 20.0 years (30.0 ttl pk-yrs)    Types: Cigarettes    Start date: 07/02/1972    Quit date: 07/02/1992    Years since quitting: 32.0   Smokeless tobacco: Never  Vaping Use   Vaping status: Never Used  Substance and Sexual Activity   Alcohol  use: No   Drug use: No   Sexual activity: Not on file  Other Topics Concern   Not on file  Social History Narrative   Pt lives [place] [address] [place] senior apartment alone. Daughter lives very close by.   Social Drivers of Health   Tobacco Use: Medium Risk (04/02/2024)   Patient History    Smoking Tobacco Use: Former    Smokeless Tobacco Use: Never    Passive Exposure: Not on Actuary Strain: Not on file  Food Insecurity: No Food Insecurity (12/05/2023)   Hunger Vital Sign    Worried About Running Out of Food in the Last Year: Never true    Ran Out of Food in the Last Year: Never true  Transportation Needs: No Transportation Needs (12/05/2023)   PRAPARE - Administrator, Civil Service  (Medical): No    Lack of Transportation (Non-Medical): No  Physical Activity: Not on file  Stress: Not on file  Social Connections: Unknown (12/05/2023)   Social Connection and Isolation Panel    Frequency of Communication with Friends and Family: Patient declined    Frequency of Social Gatherings with Friends and Family: Patient declined    Attends Religious Services: Patient declined    Database Administrator or Organizations: Patient declined    Attends Banker Meetings: Patient declined    Marital Status: Widowed  Intimate Partner Violence: Not At Risk (12/05/2023)   Humiliation, Afraid, Rape, and Kick questionnaire    Fear of Current or Ex-Partner: No    Emotionally Abused: No    Physically Abused: No    Sexually Abused: No  Depression (PHQ2-9): Not on file  Alcohol  Screen: Not  on file  Housing: Low Risk (12/05/2023)   Housing Stability Vital Sign    Unable to Pay for Housing in the Last Year: No    Number of Times Moved in the Last Year: 0    Homeless in the Last Year: No  Utilities: Not At Risk (12/05/2023)   AHC Utilities    Threatened with loss of utilities: No  Health Literacy: Not on file    No Known Allergies  Current Outpatient Medications  Medication Sig Dispense Refill   acetaminophen  (TYLENOL ) 500 MG tablet Take 1,000 mg by mouth every 6 (six) hours as needed for mild pain (pain score 1-3) or moderate pain (pain score 4-6).     albuterol  (PROAIR  HFA) 108 (90 Base) MCG/ACT inhaler Inhale 2 puffs into the lungs every 4 hours as needed only  if your can't catch your breath 6.7 g 11   ammonium lactate  (AMLACTIN) 12 % cream Apply 1 Application topically as needed for dry skin. 385 g 0   aspirin  EC 81 MG tablet Take 81 mg by mouth daily after supper.     B Complex-C-Zn-Folic Acid  (DIALYVITE  800 WITH ZINC ) 0.8 MG TABS Take 1 tablet by mouth at bedtime 90 tablet 3   budesonide -glycopyrrolate -formoterol  (BREZTRI  AEROSPHERE) 160-9-4.8 MCG/ACT AERO inhaler Take 2  puffs first thing in the morning and then another 2 puffs about 12 hours later. 10.7 g 11   calcium  carbonate (CAL-GEST ANTACID) 500 MG chewable tablet Chew and swallow 3 tablets (600 mg of elemental calcium  total) by mouth daily. (DO NOT TAKE WITH MEALS) (Patient taking differently: Chew 1,500 mg by mouth every Monday, Wednesday, and Friday.) 90 tablet 6   Cholecalciferol (VITAMIN D3 PO) Take 1 capsule by mouth every Monday, Wednesday, and Friday.     colchicine  0.6 MG tablet Take 1 tablet (0.6 mg total) by mouth 2 (two) times daily. (Patient taking differently: Take 0.6 mg by mouth See admin instructions. Take 1 tablet by mouth up to three times a day as needed for gout flare up) 180 tablet 3   doxycycline  (VIBRA -TABS) 100 MG tablet Take 1 tablet (100 mg total) by mouth 2 (two) times daily. 14 tablet 0   Glycerin -Hypromellose-PEG 400 (DRY EYE RELIEF DROPS) 0.2-0.2-1 % SOLN Place 1 drop into both eyes daily as needed (Dry eyes).     guaiFENesin  (MUCINEX ) 600 MG 12 hr tablet Take 1 tablet (600 mg total) by mouth 2 (two) times daily as needed for to loosen phlegm.     guaiFENesin  (MUCINEX ) 600 MG 12 hr tablet Take 1 tablet (600 mg total) by mouth every 12 (twelve) hours as needed for congestion. 28 tablet 0   lidocaine -prilocaine  (EMLA ) cream Apply small amount to skin /to AVF- prior to dialysis 30 g 11   loratadine  (CLARITIN ) 10 MG tablet Take 10 mg by mouth at bedtime.     midodrine  (PROAMATINE ) 10 MG tablet Take 1 tablet by mouth as directed. Take one tablet 30 mins before each hemodialysis every Mon, Wed, Fri. May take a 2nd dose while on dialysis for SBP<100 30 tablet 11   midodrine  (PROAMATINE ) 10 MG tablet Take 1 tablet by mouth as directed 30 mins before each hemodialysis every Mon, Wed, Fri. May take a 2nd dose while on dialysis for SBP<100 30 tablet 11   OXYGEN  Inhale 2 L into the lungs daily as needed (with sleep and exertion as needed [facility]).     traZODone  (DESYREL ) 50 MG tablet Take 1 tablet  (50 mg total) by  mouth at bedtime as needed. 90 tablet 3   No current facility-administered medications for this visit.    PHYSICAL EXAM There were no vitals filed for this visit.   Elderly woman in no distress Regular rate and rhythm Unlabored breathing Prominent callus of her bunion in the left medial foot No palpable pedal pulses  PERTINENT LABORATORY AND RADIOLOGIC DATA  Most recent CBC    Latest Ref Rng & Units 12/06/2023    2:28 AM 12/05/2023    4:13 AM 12/04/2023    8:26 PM  CBC  WBC 4.0 - 10.5 K/uL 5.9  5.3  5.6   Hemoglobin 12.0 - 15.0 g/dL 8.4  8.4  8.7   Hematocrit 36.0 - 46.0 % 26.3  26.2  27.8   Platelets 150 - 400 K/uL 186  162  168      Most recent CMP    Latest Ref Rng & Units 12/06/2023    2:28 AM 12/05/2023    4:13 AM 12/04/2023    8:26 PM  CMP  Glucose 70 - 99 mg/dL 94  90  894   BUN 8 - 23 mg/dL 23  14  12    Creatinine 0.44 - 1.00 mg/dL 3.44  5.66  6.31   Sodium 135 - 145 mmol/L 139  138  141   Potassium 3.5 - 5.1 mmol/L 3.4  3.4  3.4   Chloride 98 - 111 mmol/L 97  95  93   CO2 22 - 32 mmol/L 26  28  29    Calcium  8.9 - 10.3 mg/dL 6.3  6.6  7.3   Total Protein 6.5 - 8.1 g/dL  6.8  7.3   Total Bilirubin 0.0 - 1.2 mg/dL  0.7  0.7   Alkaline Phos 38 - 126 U/L  30  29   AST 15 - 41 U/L  31  33   ALT 0 - 44 U/L  17  17     Renal function CrCl cannot be calculated (Patient's most recent lab result is older than the maximum 21 days allowed.).  Hgb A1c MFr Bld (%)  Date Value  06/16/2016 6.1 (H)   +-------+-----------+-----------+------------+------------+  ABI/TBIToday's ABIToday's TBIPrevious ABIPrevious TBI  +-------+-----------+-----------+------------+------------+  Right 0.85       near absent                          +-------+-----------+-----------+------------+------------+  Left  0.74       0.30                                 +-------+-----------+-----------+------------+------------+       Debby SAILOR. Magda, MD  FACS [facility] Phone Number: [phone] 07/06/2024 11:11 AM   Total time spent on preparing this encounter including chart review, data review, collecting history, examining the patient, and coordinating care: 45 minutes  Portions of this report may have been transcribed using voice recognition software.  Every effort has been made to ensure accuracy; however, inadvertent computerized transcription errors may still be present.

## 2024-07-07 ENCOUNTER — Ambulatory Visit (HOSPITAL_COMMUNITY)

## 2024-07-07 ENCOUNTER — Ambulatory Visit (HOSPITAL_COMMUNITY): Admitting: Vascular Surgery

## 2024-07-07 ENCOUNTER — Encounter: Payer: Self-pay | Admitting: Vascular Surgery

## 2024-07-07 VITALS — BP 144/72 | HR 89 | Temp 97.9°F | Ht 64.0 in | Wt 143.0 lb

## 2024-07-07 DIAGNOSIS — I739 Peripheral vascular disease, unspecified: Secondary | ICD-10-CM

## 2024-07-13 ENCOUNTER — Ambulatory Visit: Admitting: Podiatry

## 2024-07-18 ENCOUNTER — Other Ambulatory Visit (HOSPITAL_COMMUNITY): Payer: Self-pay

## 2024-07-20 ENCOUNTER — Other Ambulatory Visit: Payer: Self-pay

## 2024-07-28 ENCOUNTER — Telehealth: Payer: Self-pay

## 2024-07-28 NOTE — Telephone Encounter (Unsigned)
 Copied from CRM [id number]. Topic: Clinical - Medication Question >> Jul 28, 2024 10:57 AM LaVerne A wrote: Reason for CRM: Patient is calling because her pharmacy keeps sending albuterol  (PROAIR  HFA) 108 (90 Base) MCG/ACT inhaler (the spray won't com out) and budesonide -glycopyrrolate -formoterol  (BREZTRI  AEROSPHERE) 160-9-4.8 MCG/ACT AERO inhaler (makes her too thirsty), but they don't work for her.  Please return her call at [phone] (cell) anytime.   Spoke with patient and daughter - will go to plan C - earlier appointment has been made

## 2024-08-06 ENCOUNTER — Ambulatory Visit: Admitting: Podiatry

## 2024-08-11 ENCOUNTER — Ambulatory Visit: Admitting: Internal Medicine

## 2024-12-01 ENCOUNTER — Ambulatory Visit: Admitting: Internal Medicine
